# Patient Record
Sex: Female | Born: 1955 | Race: White | Hispanic: No | Marital: Married | Smoking: Never smoker
Health system: Southern US, Community
[De-identification: ages and names within clinical notes are randomized; demographics above are authoritative.]

## PROBLEM LIST (undated history)

## (undated) DIAGNOSIS — E1140 Type 2 diabetes mellitus with diabetic neuropathy, unspecified: Secondary | ICD-10-CM

## (undated) DIAGNOSIS — E785 Hyperlipidemia, unspecified: Secondary | ICD-10-CM

## (undated) DIAGNOSIS — I10 Essential (primary) hypertension: Secondary | ICD-10-CM

## (undated) DIAGNOSIS — F329 Major depressive disorder, single episode, unspecified: Secondary | ICD-10-CM

## (undated) DIAGNOSIS — E119 Type 2 diabetes mellitus without complications: Secondary | ICD-10-CM

## (undated) DIAGNOSIS — G4730 Sleep apnea, unspecified: Secondary | ICD-10-CM

## (undated) DIAGNOSIS — F32A Depression, unspecified: Secondary | ICD-10-CM

## (undated) DIAGNOSIS — R609 Edema, unspecified: Secondary | ICD-10-CM

## (undated) HISTORY — DX: Depression, unspecified: F32.A

## (undated) HISTORY — PX: FOOT SURGERY: SHX648

## (undated) HISTORY — DX: Essential (primary) hypertension: I10

## (undated) HISTORY — PX: TOTAL KNEE ARTHROPLASTY: SHX125

## (undated) HISTORY — DX: Hyperlipidemia, unspecified: E78.5

## (undated) HISTORY — DX: Morbid (severe) obesity due to excess calories: E66.01

## (undated) HISTORY — DX: Edema, unspecified: R60.9

## (undated) HISTORY — DX: Type 2 diabetes mellitus with diabetic neuropathy, unspecified: E11.40

## (undated) HISTORY — DX: Type 2 diabetes mellitus without complications: E11.9

## (undated) HISTORY — PX: SHOULDER SURGERY: SHX246

## (undated) HISTORY — DX: Sleep apnea, unspecified: G47.30

## (undated) HISTORY — PX: WRIST SURGERY: SHX841

## (undated) HISTORY — PX: ABDOMINAL HYSTERECTOMY: SHX81

## (undated) HISTORY — PX: COLONOSCOPY: SHX174

## (undated) HISTORY — DX: Major depressive disorder, single episode, unspecified: F32.9

---

## 2004-08-16 ENCOUNTER — Ambulatory Visit: Payer: Self-pay

## 2005-09-07 ENCOUNTER — Inpatient Hospital Stay: Payer: Self-pay | Admitting: Internal Medicine

## 2006-10-07 DIAGNOSIS — E785 Hyperlipidemia, unspecified: Secondary | ICD-10-CM | POA: Insufficient documentation

## 2006-10-07 DIAGNOSIS — I10 Essential (primary) hypertension: Secondary | ICD-10-CM | POA: Insufficient documentation

## 2006-10-07 DIAGNOSIS — E1140 Type 2 diabetes mellitus with diabetic neuropathy, unspecified: Secondary | ICD-10-CM | POA: Insufficient documentation

## 2007-12-02 ENCOUNTER — Ambulatory Visit: Payer: Self-pay | Admitting: Family Medicine

## 2008-01-04 ENCOUNTER — Ambulatory Visit: Payer: Self-pay | Admitting: Gastroenterology

## 2009-08-19 ENCOUNTER — Inpatient Hospital Stay: Payer: Self-pay | Admitting: Internal Medicine

## 2009-11-15 HISTORY — DX: Morbid (severe) obesity due to excess calories: E66.01

## 2010-04-23 ENCOUNTER — Ambulatory Visit: Payer: Self-pay | Admitting: Family Medicine

## 2010-06-11 ENCOUNTER — Ambulatory Visit: Payer: Self-pay | Admitting: Family Medicine

## 2010-11-12 ENCOUNTER — Ambulatory Visit: Payer: Self-pay | Admitting: Family Medicine

## 2012-12-22 ENCOUNTER — Ambulatory Visit (INDEPENDENT_AMBULATORY_CARE_PROVIDER_SITE_OTHER): Payer: 59 | Admitting: Podiatry

## 2012-12-22 ENCOUNTER — Encounter: Payer: Self-pay | Admitting: Podiatry

## 2012-12-22 ENCOUNTER — Ambulatory Visit (INDEPENDENT_AMBULATORY_CARE_PROVIDER_SITE_OTHER): Payer: 59

## 2012-12-22 VITALS — BP 119/72 | HR 88 | Resp 16 | Ht 68.0 in | Wt 312.0 lb

## 2012-12-22 DIAGNOSIS — M79609 Pain in unspecified limb: Secondary | ICD-10-CM

## 2012-12-22 DIAGNOSIS — M79672 Pain in left foot: Secondary | ICD-10-CM

## 2012-12-22 DIAGNOSIS — M7660 Achilles tendinitis, unspecified leg: Secondary | ICD-10-CM

## 2012-12-22 DIAGNOSIS — M722 Plantar fascial fibromatosis: Secondary | ICD-10-CM

## 2012-12-22 NOTE — Progress Notes (Signed)
Tina Short presents today for followup of her plantar fasciitis and her retrocalcaneal heel spurs. She states the we are no longer wear her tennis shoes to work and as you know I can't walk with regular shoe gear. She states that her feet swelling it worse a regular basis and are becoming more more painful. She's concerned that she may have to have something done and future. She relates that her blood sugars pretty good in discussing to be progressing..  Objective: Vital signs are stable she is alert and oriented x3. I have reviewed her past medical history medications and allergies. Pulses are palpable bilateral lower extremity. Capillary fill time to digits one through 5 is noted to be immediate. Decreases sorry per since once the monofilament to the level the forefoot bilateral deep tendon reflexes are intact bilateral muscle strength is normal. Orthopedic evaluation does demonstrates nodules to the posterior aspect of the bilateral Achilles right greater than left and radiographs confirm intratendinous ossification. She also has pain on palpation to the plantar aspect of the medial calcaneal tubercle at the plantar fascial calcaneal insertion site radiographs again confirm plantar distally oriented calcaneal heel spurs with plantar fasciitis. Cutaneous evaluation does demonstrate 0 is plantar aspect the bilateral foot.  Assessment: Severe tendo Achilles tendinitis with plantar fasciitis bilateral moderate to severe edema and diabetic peripheral neuropathy.  Plan: Continue use of the compression garments bilateral lower extremity. We will request special permission from her employer to be able to wear tennis shoes to work. I encouraged stretching exercises and ice therapy. I will followup with Tina Short in the near future. Should she have questions or concerns she will notify us immediately.

## 2012-12-22 NOTE — Progress Notes (Signed)
N HURT L B/L ACHILLES D 44YRS O SUDDEN C SAME A WALKING, STANDING T TENNIS SHOES, CROCS    N HURT L 5TH DIGIT RIGHT D OCT 2014 O SLOWLY C SAME A SHOES T 0    N HURT L GREAT TOE RIGHT D 2-44M O SLOWLY C SAME A HAVING NO CUSHION T 0

## 2013-02-07 ENCOUNTER — Encounter: Payer: Self-pay | Admitting: Podiatry

## 2013-02-07 ENCOUNTER — Ambulatory Visit (INDEPENDENT_AMBULATORY_CARE_PROVIDER_SITE_OTHER): Payer: 59 | Admitting: Podiatry

## 2013-02-07 VITALS — BP 139/61 | HR 95 | Resp 16 | Ht 68.0 in | Wt 309.0 lb

## 2013-02-07 DIAGNOSIS — M109 Gout, unspecified: Secondary | ICD-10-CM

## 2013-02-07 DIAGNOSIS — M779 Enthesopathy, unspecified: Secondary | ICD-10-CM

## 2013-02-07 NOTE — Progress Notes (Signed)
Tina Short presents today stating that she woke up December 26 with a red hot swollen foot and it seems to be getting better now. She denies any trauma to the foot denies any changes in her medications. Does state that she a little more around Christmas than she usually does. States her last A1c was a 6.0. She states her kidney function was in good range.  Objective: Vital signs are stable she is alert and oriented x3. Pulses are palpable right lower extremity. Negative Homans sign. She is red hot swollen joint hallux and first metatarsophalangeal joint right foot. Tenderness on range of motion. Graphic evaluation does not demonstrate anything other than soft tissue increase in density and edema overlying the first metatarsophalangeal joint.  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 First metatarsophalangeal joint right foot.  Plan: Send for an arthritic profile and injected para-articular with Kenalog and local anesthetic.

## 2013-02-08 LAB — URIC ACID: Uric Acid: 7.3 mg/dL — ABNORMAL HIGH (ref 2.5–7.1)

## 2013-02-09 ENCOUNTER — Telehealth: Payer: Self-pay | Admitting: *Deleted

## 2013-02-09 NOTE — Telephone Encounter (Signed)
Message copied by Bing Ree on Wed Feb 09, 2013 10:49 AM ------      Message from: Ernestene Kiel T      Created: Tue Feb 08, 2013  8:09 AM       Inform Tina Short that her blood work is positive for gout and we will follow up with her at her scheduled appointment.  She is to call soon if symptoms persist. ------

## 2013-02-09 NOTE — Telephone Encounter (Signed)
Called patient to let her know her uric acid was positive for gout and we will follow up with her scheduled appt and to give [place] a call if symptoms persist

## 2013-02-28 ENCOUNTER — Ambulatory Visit (INDEPENDENT_AMBULATORY_CARE_PROVIDER_SITE_OTHER): Payer: 59 | Admitting: Podiatry

## 2013-02-28 ENCOUNTER — Encounter: Payer: Self-pay | Admitting: Podiatry

## 2013-02-28 VITALS — BP 143/72 | HR 95 | Resp 20

## 2013-02-28 DIAGNOSIS — M778 Other enthesopathies, not elsewhere classified: Secondary | ICD-10-CM

## 2013-02-28 DIAGNOSIS — M779 Enthesopathy, unspecified: Principal | ICD-10-CM

## 2013-02-28 DIAGNOSIS — M7750 Other enthesopathy of unspecified foot: Secondary | ICD-10-CM

## 2013-02-28 NOTE — Progress Notes (Signed)
Its so much better. As she refers to the gout of the first metatarsophalangeal joint of the right foot.  Objective: No erythema edema saline is drainage or odor and no pain on end range of motion of the first metatarsophalangeal joint of the right foot.  Assessment: Resolving gouty arthritis and capsulitis first metatarsophalangeal joint of the right foot.  Plan: Should she start to develop any signs of gout she's to come here in good request for blood work. Once I comes back we will have her appointment made for possible demonstration of allopurinol.

## 2013-07-07 ENCOUNTER — Ambulatory Visit: Payer: Self-pay | Admitting: Family Medicine

## 2013-07-07 LAB — HM MAMMOGRAPHY: HM MAMMO: NORMAL

## 2013-07-13 ENCOUNTER — Ambulatory Visit: Payer: Self-pay | Admitting: Family Medicine

## 2013-07-13 LAB — HM DEXA SCAN: HM DEXA SCAN: NORMAL

## 2014-05-26 LAB — BASIC METABOLIC PANEL: Glucose: 186 mg/dL

## 2014-05-26 LAB — LIPID PANEL
Cholesterol: 152 mg/dL (ref 0–200)
HDL: 31 mg/dL — AB (ref 35–70)
LDL Cholesterol: 65 mg/dL
Triglycerides: 280 mg/dL — AB (ref 40–160)

## 2014-05-26 LAB — HEMOGLOBIN A1C: HEMOGLOBIN A1C: 10.3 % — AB (ref 4.0–6.0)

## 2014-05-26 LAB — TSH: TSH: 0.95 u[IU]/mL (ref <=5.90)

## 2014-07-26 ENCOUNTER — Ambulatory Visit: Payer: Self-pay | Admitting: Family Medicine

## 2014-08-08 ENCOUNTER — Other Ambulatory Visit: Payer: Self-pay | Admitting: Family Medicine

## 2014-08-08 DIAGNOSIS — F329 Major depressive disorder, single episode, unspecified: Secondary | ICD-10-CM

## 2014-08-08 DIAGNOSIS — I10 Essential (primary) hypertension: Secondary | ICD-10-CM

## 2014-08-08 DIAGNOSIS — F32A Depression, unspecified: Secondary | ICD-10-CM

## 2014-08-11 ENCOUNTER — Other Ambulatory Visit: Payer: Self-pay

## 2014-08-11 MED ORDER — LOVASTATIN 40 MG PO TABS: 40.0000 mg | ORAL_TABLET | Freq: Every day | ORAL | Status: DC

## 2014-09-12 ENCOUNTER — Ambulatory Visit (INDEPENDENT_AMBULATORY_CARE_PROVIDER_SITE_OTHER): Payer: 59 | Admitting: Family Medicine

## 2014-09-12 ENCOUNTER — Encounter: Payer: Self-pay | Admitting: Family Medicine

## 2014-09-12 ENCOUNTER — Encounter (INDEPENDENT_AMBULATORY_CARE_PROVIDER_SITE_OTHER): Payer: Self-pay

## 2014-09-12 VITALS — BP 128/60 | HR 88 | Temp 98.2°F | Resp 16 | Ht 68.0 in | Wt 305.9 lb

## 2014-09-12 DIAGNOSIS — E1165 Type 2 diabetes mellitus with hyperglycemia: Secondary | ICD-10-CM

## 2014-09-12 DIAGNOSIS — Z862 Personal history of diseases of the blood and blood-forming organs and certain disorders involving the immune mechanism: Secondary | ICD-10-CM | POA: Diagnosis not present

## 2014-09-12 DIAGNOSIS — I10 Essential (primary) hypertension: Secondary | ICD-10-CM | POA: Insufficient documentation

## 2014-09-12 DIAGNOSIS — G4700 Insomnia, unspecified: Secondary | ICD-10-CM | POA: Insufficient documentation

## 2014-09-12 DIAGNOSIS — R5383 Other fatigue: Secondary | ICD-10-CM | POA: Insufficient documentation

## 2014-09-12 DIAGNOSIS — R600 Localized edema: Secondary | ICD-10-CM | POA: Insufficient documentation

## 2014-09-12 DIAGNOSIS — M109 Gout, unspecified: Secondary | ICD-10-CM | POA: Insufficient documentation

## 2014-09-12 DIAGNOSIS — I8390 Asymptomatic varicose veins of unspecified lower extremity: Secondary | ICD-10-CM | POA: Insufficient documentation

## 2014-09-12 DIAGNOSIS — G629 Polyneuropathy, unspecified: Secondary | ICD-10-CM

## 2014-09-12 DIAGNOSIS — D510 Vitamin B12 deficiency anemia due to intrinsic factor deficiency: Secondary | ICD-10-CM | POA: Insufficient documentation

## 2014-09-12 DIAGNOSIS — M1000 Idiopathic gout, unspecified site: Secondary | ICD-10-CM

## 2014-09-12 DIAGNOSIS — I152 Hypertension secondary to endocrine disorders: Secondary | ICD-10-CM | POA: Insufficient documentation

## 2014-09-12 DIAGNOSIS — E1121 Type 2 diabetes mellitus with diabetic nephropathy: Secondary | ICD-10-CM

## 2014-09-12 DIAGNOSIS — E0821 Diabetes mellitus due to underlying condition with diabetic nephropathy: Secondary | ICD-10-CM

## 2014-09-12 DIAGNOSIS — E1342 Other specified diabetes mellitus with diabetic polyneuropathy: Secondary | ICD-10-CM

## 2014-09-12 DIAGNOSIS — E781 Pure hyperglyceridemia: Secondary | ICD-10-CM | POA: Insufficient documentation

## 2014-09-12 DIAGNOSIS — G4733 Obstructive sleep apnea (adult) (pediatric): Secondary | ICD-10-CM | POA: Insufficient documentation

## 2014-09-12 DIAGNOSIS — G5600 Carpal tunnel syndrome, unspecified upper limb: Secondary | ICD-10-CM | POA: Insufficient documentation

## 2014-09-12 DIAGNOSIS — E1142 Type 2 diabetes mellitus with diabetic polyneuropathy: Secondary | ICD-10-CM

## 2014-09-12 DIAGNOSIS — IMO0002 Reserved for concepts with insufficient information to code with codable children: Secondary | ICD-10-CM | POA: Insufficient documentation

## 2014-09-12 LAB — GLUCOSE, POCT (MANUAL RESULT ENTRY): POC GLUCOSE: 218 mg/dL — AB (ref 70–99)

## 2014-09-12 LAB — POCT UA - MICROALBUMIN: Microalbumin Ur, POC: 50 mg/L

## 2014-09-12 LAB — POCT GLYCOSYLATED HEMOGLOBIN (HGB A1C): HEMOGLOBIN A1C: 9.2

## 2014-09-12 MED ORDER — EXENATIDE ER 2 MG ~~LOC~~ PEN
1.0000 | PEN_INJECTOR | SUBCUTANEOUS | Status: DC
Start: 2014-09-12 — End: 2014-10-24

## 2014-09-12 MED ORDER — CLOTRIMAZOLE-BETAMETHASONE 1-0.05 % EX CREA
1.0000 | TOPICAL_CREAM | Freq: Two times a day (BID) | CUTANEOUS | Status: DC
Start: 2014-09-12 — End: 2015-07-26

## 2014-09-12 NOTE — Patient Instructions (Signed)
Follow-up in one month on by durian

## 2014-09-12 NOTE — Progress Notes (Signed)
Name: Tina Short   MRN: [id number]    DOB: 1956/01/22   Date:09/12/2014       Progress Note  Subjective  Chief Complaint  Chief Complaint  Patient presents with  . Diabetes    elevated A1c in April 10.3  . Hypertension  . Hyperlipidemia  . Obesity    Diabetes She presents for her follow-up diabetic visit. She has type 2 diabetes mellitus. Her disease course has been worsening. Hypoglycemia symptoms include headaches. Pertinent negatives for hypoglycemia include no dizziness, nervousness/anxiousness, seizures or tremors. Associated symptoms include blurred vision, fatigue and weakness. Pertinent negatives for diabetes include no chest pain and no weight loss. There are no hypoglycemic complications. Symptoms are worsening. Diabetic complications include peripheral neuropathy. Risk factors for coronary artery disease include diabetes mellitus, dyslipidemia, hypertension, obesity, post-menopausal, sedentary lifestyle and stress. Current diabetic treatment includes oral agent (dual therapy). She is compliant with treatment most of the time. Her weight is increasing steadily. She is following a diabetic diet. Her overall blood glucose range is 180-200 mg/dl.  Hypertension This is a chronic problem. The current episode started more than 1 year ago. The problem is unchanged. The problem is controlled. Associated symptoms include anxiety, blurred vision, headaches, malaise/fatigue and shortness of breath. Pertinent negatives include no chest pain, neck pain, orthopnea or palpitations. Agents associated with hypertension include amphetamines. Past treatments include angiotensin blockers and diuretics. The current treatment provides moderate improvement. There are no compliance problems.   Hyperlipidemia This is a chronic problem. The current episode started more than 1 year ago. The problem is uncontrolled. Recent lipid tests were reviewed and are high. Exacerbating diseases include diabetes and  obesity. Associated symptoms include leg pain and shortness of breath. Pertinent negatives include no chest pain, focal weakness or myalgias. There are no compliance problems.    Obesity  Patient has a nearly lifelong problem and struggle with obesity. She has been on appetite suppressants for a long period of time with modest improvement. However she is unable to function without them and control her appetite. She has not been receptive to gastric operative or bariatric surgery.  Past Medical History  Diagnosis Date  . Swelling   . Diabetes   . Depression   . Neuropathy due to type 2 diabetes mellitus   . Hypertension   . Hyperlipidemia   . Obesity, morbid 11/15/2009    History  Substance Use Topics  . Smoking status: Never Smoker   . Smokeless tobacco: Never Used  . Alcohol Use: No     Current outpatient prescriptions:  .  fenofibrate (TRICOR) 145 MG tablet, , Disp: , Rfl:  .  FLUoxetine (PROZAC) 20 MG capsule, Take 3 capsules by mouth  daily, Disp: 270 capsule, Rfl: 1 .  furosemide (LASIX) 40 MG tablet, , Disp: , Rfl:  .  losartan (COZAAR) 100 MG tablet, Take 1 tablet by mouth  every day, Disp: 90 tablet, Rfl: 1 .  lovastatin (MEVACOR) 40 MG tablet, Take 1 tablet (40 mg total) by mouth at bedtime., Disp: 90 tablet, Rfl: 0 .  metFORMIN (GLUCOPHAGE) 1000 MG tablet, , Disp: , Rfl:  .  Phendimetrazine Tartrate 35 MG TABS, Take by mouth 2 (two) times daily., Disp: , Rfl:  .  potassium chloride SA (K-DUR,KLOR-CON) 20 MEQ tablet, , Disp: , Rfl:  .  venlafaxine (EFFEXOR) 75 MG tablet, Take 1 tablet by mouth two  times daily, Disp: 180 tablet, Rfl: 1  Allergies  Allergen Reactions  . Abilify  [Aripiprazole]  Other reaction(s): Difficulty breathing    Review of Systems  Constitutional: Positive for malaise/fatigue and fatigue. Negative for fever, chills and weight loss.  HENT: Negative for congestion, hearing loss, sore throat and tinnitus.   Eyes: Positive for blurred  vision. Negative for double vision and redness.  Respiratory: Positive for shortness of breath. Negative for cough and hemoptysis.   Cardiovascular: Positive for leg swelling. Negative for chest pain, palpitations, orthopnea and claudication.  Gastrointestinal: Negative for heartburn, nausea, vomiting, diarrhea, constipation and blood in stool.  Genitourinary: Negative for dysuria, urgency, frequency and hematuria.  Musculoskeletal: Negative for myalgias, back pain, joint pain, falls and neck pain.  Skin: Negative for itching.  Neurological: Positive for sensory change, weakness and headaches. Negative for dizziness, tingling, tremors, focal weakness, seizures and loss of consciousness.  Endo/Heme/Allergies: Does not bruise/bleed easily.  Psychiatric/Behavioral: Positive for depression. Negative for substance abuse. The patient is not nervous/anxious and does not have insomnia.      Objective  Filed Vitals:   09/12/14 0745  BP: 128/60  Pulse: 88  Temp: 98.2 F (36.8 C)  TempSrc: Oral  Resp: 16  Height: 5\' 8"  (1.727 m)  Weight: 305 lb 14.4 oz (138.755 kg)  SpO2: 93%     Physical Exam  Constitutional: She is oriented to person, place, and time and well-developed, well-nourished, and in no distress.  Morbidly obese  HENT:  Head: Normocephalic.  Eyes: EOM are normal. Pupils are equal, round, and reactive to light.  Neck: Normal range of motion. No thyromegaly present.  Cardiovascular: Normal rate, regular rhythm and normal heart sounds.   No murmur heard. Pulmonary/Chest: Effort normal and breath sounds normal.  Abdominal: Soft. Bowel sounds are normal.  Musculoskeletal: Normal range of motion. She exhibits no edema.  Neurological: She is alert and oriented to person, place, and time. No cranial nerve deficit. Gait normal.  Skin: Skin is warm and dry. No rash noted.  Psychiatric: Memory and affect normal.      Assessment & Plan  1. Diabetes mellitus due to underlying  condition with diabetic nephropathy And by Bydurion 2 mg once weekly - POCT HgB A1C - POCT Glucose (CBG) - POCT UA - Microalbumin - Ambulatory referral to diabetic education - POCT UA - Glucose/Protein - Fructosamine - HgB A1c  2. Diabetic peripheral neuropathy Stable  3. Essential hypertension Controlled  4. Idiopathic gout, unspecified chronicity, unspecified site Stable  5. History of anemia Stable  7. Morbid obesity due to excess calories Dietary consult  8. Diabetic nephropathy associated with type 2 diabetes mellitus Still not controlled  9. Diabetes mellitus type 2, uncontrolled Stable persistent  10. Neuropathy Persistent

## 2014-09-13 LAB — FRUCTOSAMINE: Fructosamine: 266 umol/L (ref 0–285)

## 2014-09-18 ENCOUNTER — Other Ambulatory Visit: Payer: Self-pay | Admitting: Family Medicine

## 2014-10-17 ENCOUNTER — Ambulatory Visit: Payer: 59 | Admitting: Family Medicine

## 2014-10-18 ENCOUNTER — Ambulatory Visit: Payer: Self-pay | Admitting: *Deleted

## 2014-10-24 ENCOUNTER — Ambulatory Visit (INDEPENDENT_AMBULATORY_CARE_PROVIDER_SITE_OTHER): Payer: 59 | Admitting: Family Medicine

## 2014-10-24 ENCOUNTER — Encounter: Payer: Self-pay | Admitting: Family Medicine

## 2014-10-24 VITALS — BP 132/76 | HR 87 | Temp 98.3°F | Resp 18 | Ht 68.0 in | Wt 306.3 lb

## 2014-10-24 DIAGNOSIS — R600 Localized edema: Secondary | ICD-10-CM

## 2014-10-24 DIAGNOSIS — G4733 Obstructive sleep apnea (adult) (pediatric): Secondary | ICD-10-CM | POA: Diagnosis not present

## 2014-10-24 DIAGNOSIS — G629 Polyneuropathy, unspecified: Secondary | ICD-10-CM | POA: Diagnosis not present

## 2014-10-24 DIAGNOSIS — E1165 Type 2 diabetes mellitus with hyperglycemia: Secondary | ICD-10-CM

## 2014-10-24 DIAGNOSIS — E1142 Type 2 diabetes mellitus with diabetic polyneuropathy: Secondary | ICD-10-CM

## 2014-10-24 DIAGNOSIS — I10 Essential (primary) hypertension: Secondary | ICD-10-CM | POA: Diagnosis not present

## 2014-10-24 DIAGNOSIS — R609 Edema, unspecified: Secondary | ICD-10-CM

## 2014-10-24 DIAGNOSIS — Z862 Personal history of diseases of the blood and blood-forming organs and certain disorders involving the immune mechanism: Secondary | ICD-10-CM

## 2014-10-24 DIAGNOSIS — E1140 Type 2 diabetes mellitus with diabetic neuropathy, unspecified: Secondary | ICD-10-CM

## 2014-10-24 DIAGNOSIS — E1342 Other specified diabetes mellitus with diabetic polyneuropathy: Secondary | ICD-10-CM

## 2014-10-24 DIAGNOSIS — IMO0002 Reserved for concepts with insufficient information to code with codable children: Secondary | ICD-10-CM

## 2014-10-24 MED ORDER — GLIMEPIRIDE 2 MG PO TABS: 2.0000 mg | ORAL_TABLET | Freq: Every day | ORAL | Status: DC

## 2014-10-25 ENCOUNTER — Encounter: Payer: Self-pay | Admitting: Family Medicine

## 2014-10-26 NOTE — Progress Notes (Signed)
Name: Tina Short   MRN: [id number]    DOB: 10-14-1955   Date:10/26/2014       Progress Note  Subjective  Chief Complaint  Chief Complaint  Patient presents with  . Diabetes    1 month follow up after starting Bydureon    HPI  Diabetes  Patient presents for follow-up of diabetes which is present for over 5 years. Is currently on a regimen of by durian 2 mg once weekly which she is not tolerating well because of GI symptomatology including bloating and nausea without vomiting . Patient states somewhat with their diet and exercise. There's been no hypoglycemic episodes and there is infrequently polyuria polydipsia polyphagia. His average fasting glucoses been in the low around the low 100s with a high around 100s . There is no end organ disease.  Last diabetic eye exam was less than one year.   Last visit with dietitian was at work within the last year. Last microalbumin was obtained 8-16 and was positive at 50 .   Hypertension   Patient presents for follow-up of hypertension. It has been present for over over 5 years.  Patient states that there is compliance with medical regimen which consists of furosemide formal 100 mg daily 40 mg daily and losartan 100 mg daily  . There is  some nephropathy as  end organ disease. Cardiac risk factors include hypertension hyperlipidemia and diabetes.  Exercise regimen consist of minimal .  Diet consist of presents with noncompliance .  Obesity  Patient has a history of obesity for over 5ars.  Attempts at weight loss have included diet and exercise andand phentermine Results of this regimen  have lesson successful  Patient now voices and interest in weight loss by 1 to be back on phentermine again future    Past Medical History  Diagnosis Date  . Swelling   . Diabetes   . Depression   . Neuropathy due to type 2 diabetes mellitus   . Hypertension   . Hyperlipidemia   . Obesity, morbid 11/15/2009    Social History  Substance Use Topics  .  Smoking status: Never Smoker   . Smokeless tobacco: Never Used  . Alcohol Use: No     Current outpatient prescriptions:  .  clotrimazole-betamethasone (LOTRISONE) cream, Apply 1 application topically 2 (two) times daily., Disp: 30 g, Rfl: 0 .  fenofibrate (TRICOR) 145 MG tablet, , Disp: , Rfl:  .  FLUoxetine (PROZAC) 20 MG capsule, Take 3 capsules by mouth  daily, Disp: 270 capsule, Rfl: 1 .  furosemide (LASIX) 40 MG tablet, , Disp: , Rfl:  .  glimepiride (AMARYL) 2 MG tablet, Take 1 tablet (2 mg total) by mouth daily before breakfast., Disp: 30 tablet, Rfl: 1 .  losartan (COZAAR) 100 MG tablet, Take 1 tablet by mouth  every day, Disp: 90 tablet, Rfl: 1 .  lovastatin (MEVACOR) 40 MG tablet, Take 1 tablet (40 mg total) by mouth at bedtime. (Patient not taking: Reported on 09/12/2014), Disp: 90 tablet, Rfl: 0 .  metFORMIN (GLUCOPHAGE) 1000 MG tablet, , Disp: , Rfl:  .  Phendimetrazine Tartrate 35 MG TABS, Take by mouth 2 (two) times daily., Disp: , Rfl:  .  potassium chloride SA (K-DUR,KLOR-CON) 20 MEQ tablet, Take 1 tablet by mouth  every day, Disp: 90 tablet, Rfl: 3 .  venlafaxine (EFFEXOR) 75 MG tablet, Take 1 tablet by mouth two  times daily, Disp: 180 tablet, Rfl: 1  Allergies  Allergen Reactions  . Abilify  [  Aripiprazole]     Other reaction(s): Difficulty breathing    Review of Systems  Constitutional: Negative for fever, chills and weight loss.  HENT: Negative for congestion, hearing loss, sore throat and tinnitus.   Eyes: Negative for blurred vision, double vision and redness.  Respiratory: Positive for shortness of breath. Negative for cough and hemoptysis.   Cardiovascular: Positive for leg swelling. Negative for chest pain, palpitations, orthopnea and claudication.  Gastrointestinal: Negative for heartburn, nausea, vomiting, diarrhea, constipation and blood in stool.  Genitourinary: Negative for dysuria, urgency, frequency and hematuria.  Musculoskeletal: Negative for  myalgias, back pain, joint pain, falls and neck pain.  Skin: Negative for itching.  Neurological: Positive for tingling. Negative for dizziness, tremors, focal weakness, seizures, loss of consciousness, weakness and headaches.  Endo/Heme/Allergies: Does not bruise/bleed easily.  Psychiatric/Behavioral: Negative for depression and substance abuse. The patient is not nervous/anxious and does not have insomnia.      Objective  Filed Vitals:   10/24/14 0759  BP: 132/76  Pulse: 87  Temp: 98.3 F (36.8 C)  TempSrc: Oral  Resp: 18  Height: 5\' 8"  (1.727 m)  Weight: 306 lb 4.8 oz (138.937 kg)  SpO2: 94%     Physical Exam  Constitutional: She is oriented to person, place, and time and well-developed, well-nourished, and in no distress.  Morbidly obese in no acute distress  HENT:  Head: Normocephalic.  Eyes: EOM are normal. Pupils are equal, round, and reactive to light.  Neck: Normal range of motion. No thyromegaly present.  Cardiovascular: Normal rate, regular rhythm, normal heart sounds and intact distal pulses.   No murmur heard. Pulmonary/Chest: Effort normal and breath sounds normal.  Musculoskeletal: Normal range of motion. She exhibits tenderness. She exhibits no edema.  Neurological: She is alert and oriented to person, place, and time. No cranial nerve deficit. Gait normal.  Skin: Skin is warm and dry. No rash noted.  Psychiatric: Memory and affect normal.  Appears chronically depressed      Assessment & Plan   1. Type 2 diabetes mellitus, uncontrolled, with neuropathy DC by durian DC by durian - glimepiride (AMARYL) 2 MG tablet; Take 1 tablet (2 mg total) by mouth daily before breakfast.  Dispense: 30 tablet; Refill: 1 - Amb ref to Medical Nutrition Therapy-MNT  2. Benign essential HTN Well-controlled  3. Obstructive sleep apnea Unchanged  4. Diabetic peripheral neuropathy Stable  5. Neuropathy Stable  6. Edema extremities Unchanged  7. History of  anemia Stable stable  8. Morbid obesity due to excess calories Stable

## 2014-11-13 ENCOUNTER — Other Ambulatory Visit: Payer: Self-pay | Admitting: Family Medicine

## 2014-12-27 ENCOUNTER — Ambulatory Visit (INDEPENDENT_AMBULATORY_CARE_PROVIDER_SITE_OTHER): Payer: 59 | Admitting: Family Medicine

## 2014-12-27 ENCOUNTER — Encounter: Payer: Self-pay | Admitting: Family Medicine

## 2014-12-27 VITALS — BP 124/76 | HR 93 | Temp 98.1°F | Resp 16 | Ht 68.0 in | Wt 306.0 lb

## 2014-12-27 DIAGNOSIS — Z23 Encounter for immunization: Secondary | ICD-10-CM

## 2014-12-27 DIAGNOSIS — F329 Major depressive disorder, single episode, unspecified: Secondary | ICD-10-CM

## 2014-12-27 DIAGNOSIS — E669 Obesity, unspecified: Secondary | ICD-10-CM | POA: Diagnosis not present

## 2014-12-27 DIAGNOSIS — E785 Hyperlipidemia, unspecified: Secondary | ICD-10-CM

## 2014-12-27 DIAGNOSIS — E1169 Type 2 diabetes mellitus with other specified complication: Secondary | ICD-10-CM

## 2014-12-27 DIAGNOSIS — F32A Depression, unspecified: Secondary | ICD-10-CM

## 2014-12-27 LAB — GLUCOSE, POCT (MANUAL RESULT ENTRY): POC Glucose: 178 mg/dl — AB (ref 70–99)

## 2014-12-27 LAB — POCT GLYCOSYLATED HEMOGLOBIN (HGB A1C): Hemoglobin A1C: 6.4

## 2014-12-27 MED ORDER — VENLAFAXINE HCL 75 MG PO TABS: 75.0000 mg | ORAL_TABLET | Freq: Three times a day (TID) | ORAL | Status: DC

## 2014-12-27 NOTE — Progress Notes (Signed)
Name: Tina Short   MRN: [id number]    DOB: May 26, 1955   Date:12/27/2014       Progress Note  Subjective  Chief Complaint  Chief Complaint  Patient presents with  . Hypertension    3 month follow up  . Diabetes  . Hyperlipidemia    Hypertension Associated symptoms include headaches and malaise/fatigue. Pertinent negatives include no blurred vision, chest pain, neck pain, orthopnea, palpitations or shortness of breath.  Diabetes Hypoglycemia symptoms include headaches. Pertinent negatives for hypoglycemia include no dizziness, nervousness/anxiousness, seizures or tremors. Pertinent negatives for diabetes include no blurred vision, no chest pain, no weakness and no weight loss.  Hyperlipidemia Pertinent negatives include no chest pain, focal weakness, myalgias or shortness of breath.     Diabetes  Patient presents for follow-up of diabetes which is present for over 5 years. Is currently on a regimen of metformin 1 g twice a day and glimepiride 2 mg daily. . Patient states more compliant with their diet and exercise. There's been no hypoglycemic episodes and there no polyuria polydipsia polyphagia. His average fasting glucoses been in the low around -- with a high around - . There is no end organ disease.  Last diabetic eye exam was earlier this year.   Last visit with dietitian was last year. Last microalbumin was obtained August of this year and was elevated at 50 .   Hyperlipidemia  Patient has a history of hyperlipidemia for over 5 years.  Current medical regimen consist of lovastatin 40 mg daily at bedtime fenofibrate 145 daily. .  Compliance is good .  Diet and exercise are currently followed usually .  Risk factors for cardiovascular disease include hyperlipidemia hypertension diabetes and obesity and sedentary lifestyle .   There have been no side effects from the medication.    Hypertension   Patient presents for follow-up of hypertension. It has been present for over over 5  losartan 100 mg daily and pro-spine 40 mg daily potassium 20 mEq daily years.  Patient states that there is compliance with medical regimen which consists of as in prior sentence . There is no end organ disease. Cardiac risk factors include hypertension hyperlipidemia and diabetes.  Exercise regimen consist of minimal walking .  Diet consist of some salt restriction .  Obesity   Very long-standing history of obesity with minimal success even with cues appetite suppressants and dietary consultations and she often eats as a treatment for her depression  Depression  Long-standing history of depression currently being treated by Prozac 60 mg daily and Effexor 150 mg daily. Patient still has lack of energy particularly AVMs.   Past Medical History  Diagnosis Date  . Swelling   . Diabetes ([place])   . Depression   . Neuropathy due to type 2 diabetes mellitus ([place])   . Hypertension   . Hyperlipidemia   . Obesity, morbid ([place]) 11/15/2009    Social History  Substance Use Topics  . Smoking status: Never Smoker   . Smokeless tobacco: Never Used  . Alcohol Use: No     Current outpatient prescriptions:  .  clotrimazole-betamethasone (LOTRISONE) cream, Apply 1 application topically 2 (two) times daily., Disp: 30 g, Rfl: 0 .  fenofibrate (TRICOR) 145 MG tablet, , Disp: , Rfl:  .  FLUoxetine (PROZAC) 20 MG capsule, Take 3 capsules by mouth  daily, Disp: 270 capsule, Rfl: 1 .  furosemide (LASIX) 40 MG tablet, , Disp: , Rfl:  .  glimepiride (AMARYL) 2 MG tablet, Take  1 tablet (2 mg total) by mouth daily before breakfast., Disp: 30 tablet, Rfl: 1 .  losartan (COZAAR) 100 MG tablet, Take 1 tablet by mouth  every day, Disp: 90 tablet, Rfl: 1 .  lovastatin (MEVACOR) 40 MG tablet, Take 1 tablet (40 mg total) by mouth at bedtime. (Patient not taking: Reported on 09/12/2014), Disp: 90 tablet, Rfl: 0 .  metFORMIN (GLUCOPHAGE) 1000 MG tablet, Take 1 tablet by mouth two  times daily, Disp: 180 tablet, Rfl: 1 .   Phendimetrazine Tartrate 35 MG TABS, Take by mouth 2 (two) times daily., Disp: , Rfl:  .  potassium chloride SA (K-DUR,KLOR-CON) 20 MEQ tablet, Take 1 tablet by mouth  every day, Disp: 90 tablet, Rfl: 3 .  venlafaxine (EFFEXOR) 75 MG tablet, Take 1 tablet by mouth two  times daily, Disp: 180 tablet, Rfl: 1  Allergies  Allergen Reactions  . Abilify  [Aripiprazole]     Other reaction(s): Difficulty breathing    Review of Systems  Constitutional: Positive for malaise/fatigue. Negative for fever, chills and weight loss.  HENT: Negative for congestion, hearing loss, sore throat and tinnitus.   Eyes: Negative for blurred vision, double vision and redness.  Respiratory: Positive for cough. Negative for hemoptysis and shortness of breath.   Cardiovascular: Positive for leg swelling. Negative for chest pain, palpitations, orthopnea and claudication.  Gastrointestinal: Negative for heartburn, nausea, vomiting, diarrhea, constipation and blood in stool.  Genitourinary: Negative for dysuria, urgency, frequency and hematuria.  Musculoskeletal: Positive for back pain and joint pain. Negative for myalgias, falls and neck pain.  Skin: Negative for itching.  Neurological: Positive for headaches. Negative for dizziness, tingling, tremors, focal weakness, seizures, loss of consciousness and weakness.  Endo/Heme/Allergies: Does not bruise/bleed easily.  Psychiatric/Behavioral: Positive for depression. Negative for substance abuse. The patient has insomnia. The patient is not nervous/anxious.      Objective  Filed Vitals:   12/27/14 0756  BP: 124/76  Pulse: 93  Temp: 98.1 F (36.7 C)  TempSrc: Oral  Resp: 16  Height: 5\' 8"  (1.727 m)  Weight: 306 lb (138.801 kg)  SpO2: 96%     Physical Exam  Constitutional: She is oriented to person, place, and time.  Morbidly obese in no acute distress  HENT:  Head: Normocephalic.  Eyes: EOM are normal. Pupils are equal, round, and reactive to light.   Neck: Normal range of motion. No thyromegaly present.  Cardiovascular: Normal rate, regular rhythm and normal heart sounds.   No murmur heard. Pulmonary/Chest: Effort normal and breath sounds normal.  Abdominal: Soft. Bowel sounds are normal.  Musculoskeletal: Normal range of motion. She exhibits edema.  Neurological: She is alert and oriented to person, place, and time. No cranial nerve deficit. Gait normal.  Skin: Skin is warm and dry. No rash noted.  Psychiatric: Memory normal.  Depressed      Assessment & Plan  1. Type 2 diabetes mellitus with other specified complication (HCC) Much better controlled - POCT HgB A1C - POCT Glucose (CBG) - Comprehensive Metabolic Panel (CMET)  2. Hyperlipemia Obtain lab - Lipid panel - TSH  3. Need for pneumococcal vaccination Given  - Pneumococcal polysaccharide vaccine 23-valent greater than or equal to 2yo subcutaneous/IM  4. Obesity Stable  - TSH   5.Depression  Continue Prozac to 60 mg daily and increase Effexor to 150 mg in the a.m. and 70 5 in the PM

## 2015-01-01 ENCOUNTER — Other Ambulatory Visit: Payer: Self-pay | Admitting: Family Medicine

## 2015-01-01 NOTE — Telephone Encounter (Signed)
I am forwarding this encounter to the designated PCP and/or their nursing staff for further management of the tasks requested. Thank you.  

## 2015-01-04 ENCOUNTER — Other Ambulatory Visit: Payer: Self-pay | Admitting: Family Medicine

## 2015-01-18 ENCOUNTER — Other Ambulatory Visit: Payer: Self-pay

## 2015-01-18 MED ORDER — GLIMEPIRIDE 2 MG PO TABS: ORAL_TABLET | ORAL | Status: DC

## 2015-02-21 ENCOUNTER — Other Ambulatory Visit: Payer: Self-pay | Admitting: Family Medicine

## 2015-03-29 ENCOUNTER — Ambulatory Visit: Payer: 59 | Admitting: Family Medicine

## 2015-05-18 ENCOUNTER — Ambulatory Visit: Payer: 59 | Admitting: Family Medicine

## 2015-05-21 ENCOUNTER — Other Ambulatory Visit: Payer: Self-pay | Admitting: Family Medicine

## 2015-07-09 ENCOUNTER — Other Ambulatory Visit: Payer: Self-pay | Admitting: Family Medicine

## 2015-07-26 ENCOUNTER — Ambulatory Visit (INDEPENDENT_AMBULATORY_CARE_PROVIDER_SITE_OTHER): Payer: 59 | Admitting: Family Medicine

## 2015-07-26 ENCOUNTER — Encounter: Payer: Self-pay | Admitting: Family Medicine

## 2015-07-26 VITALS — BP 118/76 | HR 87 | Temp 98.9°F | Resp 18 | Ht 68.0 in | Wt 308.1 lb

## 2015-07-26 DIAGNOSIS — E1140 Type 2 diabetes mellitus with diabetic neuropathy, unspecified: Secondary | ICD-10-CM | POA: Diagnosis not present

## 2015-07-26 DIAGNOSIS — R609 Edema, unspecified: Secondary | ICD-10-CM

## 2015-07-26 DIAGNOSIS — E1165 Type 2 diabetes mellitus with hyperglycemia: Secondary | ICD-10-CM | POA: Diagnosis not present

## 2015-07-26 DIAGNOSIS — IMO0002 Reserved for concepts with insufficient information to code with codable children: Secondary | ICD-10-CM

## 2015-07-26 DIAGNOSIS — M5090 Cervical disc disorder, unspecified, unspecified cervical region: Secondary | ICD-10-CM | POA: Diagnosis not present

## 2015-07-26 DIAGNOSIS — I10 Essential (primary) hypertension: Secondary | ICD-10-CM

## 2015-07-26 DIAGNOSIS — E559 Vitamin D deficiency, unspecified: Secondary | ICD-10-CM

## 2015-07-26 DIAGNOSIS — E785 Hyperlipidemia, unspecified: Secondary | ICD-10-CM

## 2015-07-26 DIAGNOSIS — E538 Deficiency of other specified B group vitamins: Secondary | ICD-10-CM | POA: Diagnosis not present

## 2015-07-26 DIAGNOSIS — R600 Localized edema: Secondary | ICD-10-CM

## 2015-07-26 LAB — POCT GLYCOSYLATED HEMOGLOBIN (HGB A1C): HEMOGLOBIN A1C: 183

## 2015-07-26 LAB — GLUCOSE, POCT (MANUAL RESULT ENTRY): POC Glucose: 7.2 mg/dl — AB (ref 70–99)

## 2015-07-26 MED ORDER — VENLAFAXINE HCL ER 150 MG PO CP24: 150.0000 mg | ORAL_CAPSULE | Freq: Every day | ORAL | Status: DC

## 2015-07-26 NOTE — Progress Notes (Addendum)
Date:  07/26/2015   Name:  Tina Short   DOB:  [birth year]   MRN:  [id number]  PCP:  Ashok Norris, MD    Chief Complaint: Diabetes; Hypertension; and Hyperlipidemia   History of Present Illness:  This is a 60 y.o. female seen for seven month f/u. On both Effexor and Prozac for depression. C/o exacerbation neck pain past week, known cervical disc dz, seen at urgent care 3d ago, placed on Mobic, baclofen, and tramadol, improving. Taking metformin only once daily. On Lasix daily for ankle swelling, improves overnight.  Review of Systems:  Review of Systems  Constitutional: Negative for fever and fatigue.  Respiratory: Negative for cough and shortness of breath.   Cardiovascular: Negative for chest pain and leg swelling.  Endocrine: Negative for polyuria.  Genitourinary: Negative for difficulty urinating.  Neurological: Negative for syncope and light-headedness.    Patient Active Problem List   Diagnosis Date Noted  . History of anemia 09/12/2014  . Carpal tunnel syndrome 09/12/2014  . Diabetic peripheral neuropathy ([place]) 09/12/2014  . Obstructive sleep apnea 09/12/2014  . Edema extremities 09/12/2014  . Fatigue 09/12/2014  . Gout 09/12/2014  . Hypertriglyceridemia 09/12/2014  . Pernicious anemia 09/12/2014  . Extreme obesity ([place]) 11/15/2009  . Benign essential HTN 10/07/2006  . Type 2 diabetes mellitus, uncontrolled, with neuropathy ([place]) 10/07/2006  . Hyperlipidemia 10/07/2006    Prior to Admission medications   Medication Sig Start Date End Date Taking? Authorizing Provider  baclofen (LIORESAL) 10 MG tablet Take by mouth. 07/23/15 08/22/15 Yes Historical Provider, MD  meloxicam (MOBIC) 7.5 MG tablet Take by mouth. 07/23/15 07/22/16 Yes Historical Provider, MD  traMADol (ULTRAM) 50 MG tablet Take by mouth. 07/23/15 08/02/15 Yes Historical Provider, MD  ACCU-CHEK AVIVA PLUS test strip Test once daily 02/21/15   Ashok Norris, MD  atorvastatin (LIPITOR) 40 MG tablet Take 1  tablet by mouth at  bedtime 05/22/15   Ashok Norris, MD  fenofibrate (TRICOR) 145 MG tablet  10/21/12   Historical Provider, MD  furosemide (LASIX) 40 MG tablet Take 1 tablet by mouth two  times daily as needed 02/21/15   Ashok Norris, MD  glimepiride (AMARYL) 2 MG tablet Take 1 tablet by mouth  daily before breakfast 02/21/15   Ashok Norris, MD  losartan (COZAAR) 100 MG tablet Take 1 tablet by mouth  every day 05/22/15   Ashok Norris, MD  metFORMIN (GLUCOPHAGE) 1000 MG tablet Take 1 tablet by mouth two  times daily 05/22/15   Ashok Norris, MD  potassium chloride SA (K-DUR,KLOR-CON) 20 MEQ tablet Take 1 tablet by mouth  every day 05/22/15   Ashok Norris, MD  venlafaxine XR (EFFEXOR XR) 150 MG 24 hr capsule Take 1 capsule (150 mg total) by mouth daily with breakfast. 07/26/15   Adline Potter, MD    Allergies  Allergen Reactions  . Abilify  [Aripiprazole]     Other reaction(s): Difficulty breathing    Past Surgical History  Procedure Laterality Date  . Foot surgery Right     X2  . Wrist surgery Bilateral   . Shoulder surgery Right   . Abdominal hysterectomy      Social History  Substance Use Topics  . Smoking status: Never Smoker   . Smokeless tobacco: Never Used  . Alcohol Use: No    No family history on file.  Medication list has been reviewed and updated.  Physical Examination: BP 118/76 mmHg  Pulse 87  Temp(Src) 98.9 F (37.2 C)  Resp 18  Ht 5\' 8"  (1.727 m)  Wt 308 lb 2 oz (139.765 kg)  BMI 46.86 kg/m2  SpO2 93%  Physical Exam  Constitutional: She appears well-developed and well-nourished.  Cardiovascular: Normal rate, regular rhythm and normal heart sounds.   Pulmonary/Chest: Effort normal and breath sounds normal.  Musculoskeletal:  1+ BLE edema  Neurological: She is alert.  Skin: Skin is warm and dry.  Psychiatric: She has a normal mood and affect. Her behavior is normal.  Nursing note and vitals reviewed.   Assessment and Plan:  1. Benign  essential HTN Well controlled on losartan/Lasix - Comprehensive Metabolic Panel (CMET) - CBC  2. Type 2 diabetes mellitus, uncontrolled, with neuropathy (HCC) A1c 7.3% today, up from 6.4% in November, increase metformin to 1000 mg bid, cont Amaryl - POCT HgB A1C - POCT Glucose (CBG)  3. Hyperlipidemia Unclear control on Lipitor/Tricor - Lipid Profile - TSH  4. Cervical disc disease Sxs improving, complete course Mobic, use baclofen qhs and tramadol prn  5. Edema extremities Likely weight related, continue Lasix  6. Morbid obesity, unspecified obesity type ([place]) Weight up 2# from November, exercise/weight loss advised - VITAMIN D 25 Hydroxy (Vit-D Deficiency, Fractures) - B12  7. Depression D/c Prozac, change Effexor to XR 150 mg daily  Return in about 3 months (around 10/26/2015).  Satira Anis. [facility]  07/26/2015

## 2015-07-27 DIAGNOSIS — E538 Deficiency of other specified B group vitamins: Secondary | ICD-10-CM | POA: Insufficient documentation

## 2015-07-27 DIAGNOSIS — E559 Vitamin D deficiency, unspecified: Secondary | ICD-10-CM | POA: Insufficient documentation

## 2015-07-27 LAB — CBC
HEMOGLOBIN: 13.8 g/dL (ref 11.1–15.9)
Hematocrit: 41.3 % (ref 34.0–46.6)
MCH: 32.2 pg (ref 26.6–33.0)
MCHC: 33.4 g/dL (ref 31.5–35.7)
MCV: 97 fL (ref 79–97)
Platelets: 221 10*3/uL (ref 150–379)
RBC: 4.28 x10E6/uL (ref 3.77–5.28)
RDW: 13.8 % (ref 12.3–15.4)
WBC: 5.2 10*3/uL (ref 3.4–10.8)

## 2015-07-27 LAB — COMPREHENSIVE METABOLIC PANEL
A/G RATIO: 1.6 (ref 1.2–2.2)
ALBUMIN: 4.2 g/dL (ref 3.6–4.8)
ALT: 32 IU/L (ref 0–32)
AST: 43 IU/L — ABNORMAL HIGH (ref 0–40)
Alkaline Phosphatase: 61 IU/L (ref 39–117)
BUN / CREAT RATIO: 28 (ref 12–28)
BUN: 18 mg/dL (ref 8–27)
Bilirubin Total: 0.6 mg/dL (ref 0.0–1.2)
CALCIUM: 9.8 mg/dL (ref 8.7–10.3)
CO2: 22 mmol/L (ref 18–29)
Chloride: 102 mmol/L (ref 96–106)
Creatinine, Ser: 0.64 mg/dL (ref 0.57–1.00)
GFR, EST AFRICAN AMERICAN: 111 mL/min/{1.73_m2} (ref >59)
GFR, EST NON AFRICAN AMERICAN: 97 mL/min/{1.73_m2} (ref >59)
GLOBULIN, TOTAL: 2.7 g/dL (ref 1.5–4.5)
Glucose: 177 mg/dL — ABNORMAL HIGH (ref 65–99)
POTASSIUM: 4.6 mmol/L (ref 3.5–5.2)
SODIUM: 142 mmol/L (ref 134–144)
TOTAL PROTEIN: 6.9 g/dL (ref 6.0–8.5)

## 2015-07-27 LAB — LIPID PANEL
CHOL/HDL RATIO: 5.6 ratio — AB (ref 0.0–4.4)
Cholesterol, Total: 167 mg/dL (ref 100–199)
HDL: 30 mg/dL — AB (ref >39)
LDL Calculated: 65 mg/dL (ref 0–99)
Triglycerides: 362 mg/dL — ABNORMAL HIGH (ref 0–149)
VLDL Cholesterol Cal: 72 mg/dL — ABNORMAL HIGH (ref 5–40)

## 2015-07-27 LAB — TSH: TSH: 1.38 u[IU]/mL (ref 0.450–4.500)

## 2015-07-27 LAB — VITAMIN B12: Vitamin B-12: 208 pg/mL — ABNORMAL LOW (ref 211–946)

## 2015-07-27 LAB — VITAMIN D 25 HYDROXY (VIT D DEFICIENCY, FRACTURES): VIT D 25 HYDROXY: 16.8 ng/mL — AB (ref 30.0–100.0)

## 2015-07-27 MED ORDER — VITAMIN B-12 1000 MCG PO TABS: 1000.0000 ug | ORAL_TABLET | Freq: Every day | ORAL | Status: DC

## 2015-07-27 MED ORDER — VITAMIN D 50 MCG (2000 UT) PO CAPS: 1.0000 | ORAL_CAPSULE | Freq: Every day | ORAL | Status: DC

## 2015-07-27 NOTE — Addendum Note (Signed)
Addended by: Adline Potter on: 07/27/2015 09:34 AM   Modules accepted: Orders

## 2015-08-29 ENCOUNTER — Other Ambulatory Visit: Payer: Self-pay | Admitting: Family Medicine

## 2015-09-03 ENCOUNTER — Other Ambulatory Visit: Payer: Self-pay

## 2015-09-03 ENCOUNTER — Other Ambulatory Visit: Payer: Self-pay | Admitting: Family Medicine

## 2015-09-03 MED ORDER — GLIMEPIRIDE 2 MG PO TABS: ORAL_TABLET | ORAL | 0 refills | Status: DC

## 2015-09-03 MED ORDER — ATORVASTATIN CALCIUM 40 MG PO TABS: 40.0000 mg | ORAL_TABLET | Freq: Every day | ORAL | 0 refills | Status: DC

## 2015-09-03 MED ORDER — METFORMIN HCL 1000 MG PO TABS: 1000.0000 mg | ORAL_TABLET | Freq: Two times a day (BID) | ORAL | 0 refills | Status: DC

## 2015-09-03 MED ORDER — LOSARTAN POTASSIUM 100 MG PO TABS: 100.0000 mg | ORAL_TABLET | Freq: Every day | ORAL | 0 refills | Status: DC

## 2015-09-04 NOTE — Telephone Encounter (Signed)
Patient was seen July 26, 2015 with Dr. Vicente Masson and is coming to see Dr. Ancil Boozer in September due to Dr. Rutherford Nail being out.

## 2015-10-17 ENCOUNTER — Other Ambulatory Visit: Payer: Self-pay | Admitting: Family Medicine

## 2015-10-19 ENCOUNTER — Other Ambulatory Visit: Payer: Self-pay

## 2015-10-19 NOTE — Telephone Encounter (Addendum)
Patient requesting refill of Furosemide and Fenofibrate be sent to [facility].

## 2015-10-21 MED ORDER — FUROSEMIDE 40 MG PO TABS: ORAL_TABLET | ORAL | 0 refills | Status: DC

## 2015-10-22 ENCOUNTER — Other Ambulatory Visit: Payer: Self-pay

## 2015-10-22 ENCOUNTER — Other Ambulatory Visit: Payer: Self-pay | Admitting: Family Medicine

## 2015-10-22 MED ORDER — GLIMEPIRIDE 2 MG PO TABS: ORAL_TABLET | ORAL | 0 refills | Status: DC

## 2015-10-22 NOTE — Telephone Encounter (Signed)
Can we just call in one month to local pharmacy. I will likely change rx

## 2015-10-22 NOTE — Progress Notes (Unsigned)
Called and left patient a message about medication refill for 1 month until her appointment. I have tried patient cell and work number. Dr. Ancil Boozer ok Glimepiride for 1 month with 0 refills to be sent to local pharmacy.

## 2015-10-25 ENCOUNTER — Other Ambulatory Visit: Payer: Self-pay

## 2015-10-25 NOTE — Telephone Encounter (Signed)
I will likely change the medication, so I sent 30 days to local pharmacy

## 2015-10-25 NOTE — Telephone Encounter (Signed)
Patient requesting refill of Amaryl be sent to [facility].

## 2015-10-25 NOTE — Telephone Encounter (Signed)
Called and denied prescription with [facility] on 10/25/15 at 2:33 p.m.

## 2015-10-30 ENCOUNTER — Encounter: Payer: Self-pay | Admitting: Family Medicine

## 2015-10-30 ENCOUNTER — Ambulatory Visit (INDEPENDENT_AMBULATORY_CARE_PROVIDER_SITE_OTHER): Payer: 59 | Admitting: Family Medicine

## 2015-10-30 VITALS — BP 134/62 | HR 95 | Temp 98.1°F | Resp 18 | Ht 68.0 in | Wt 306.2 lb

## 2015-10-30 DIAGNOSIS — E1169 Type 2 diabetes mellitus with other specified complication: Secondary | ICD-10-CM

## 2015-10-30 DIAGNOSIS — R609 Edema, unspecified: Secondary | ICD-10-CM

## 2015-10-30 DIAGNOSIS — E538 Deficiency of other specified B group vitamins: Secondary | ICD-10-CM

## 2015-10-30 DIAGNOSIS — M109 Gout, unspecified: Secondary | ICD-10-CM

## 2015-10-30 DIAGNOSIS — F339 Major depressive disorder, recurrent, unspecified: Secondary | ICD-10-CM

## 2015-10-30 DIAGNOSIS — Z1159 Encounter for screening for other viral diseases: Secondary | ICD-10-CM

## 2015-10-30 DIAGNOSIS — E785 Hyperlipidemia, unspecified: Secondary | ICD-10-CM | POA: Diagnosis not present

## 2015-10-30 DIAGNOSIS — E1140 Type 2 diabetes mellitus with diabetic neuropathy, unspecified: Secondary | ICD-10-CM | POA: Diagnosis not present

## 2015-10-30 DIAGNOSIS — R600 Localized edema: Secondary | ICD-10-CM

## 2015-10-30 DIAGNOSIS — I10 Essential (primary) hypertension: Secondary | ICD-10-CM

## 2015-10-30 DIAGNOSIS — E1165 Type 2 diabetes mellitus with hyperglycemia: Secondary | ICD-10-CM | POA: Diagnosis not present

## 2015-10-30 DIAGNOSIS — E559 Vitamin D deficiency, unspecified: Secondary | ICD-10-CM

## 2015-10-30 DIAGNOSIS — IMO0002 Reserved for concepts with insufficient information to code with codable children: Secondary | ICD-10-CM

## 2015-10-30 MED ORDER — ATORVASTATIN CALCIUM 40 MG PO TABS: 40.0000 mg | ORAL_TABLET | Freq: Every day | ORAL | 0 refills | Status: DC

## 2015-10-30 MED ORDER — VITAMIN D (ERGOCALCIFEROL) 1.25 MG (50000 UNIT) PO CAPS: 50000.0000 [IU] | ORAL_CAPSULE | ORAL | 0 refills | Status: DC

## 2015-10-30 MED ORDER — CYANOCOBALAMIN 1000 MCG SL SUBL
1.0000 | SUBLINGUAL_TABLET | Freq: Every day | SUBLINGUAL | 1 refills | Status: DC
Start: 2015-10-30 — End: 2016-04-29

## 2015-10-30 MED ORDER — FUROSEMIDE 40 MG PO TABS: ORAL_TABLET | ORAL | 0 refills | Status: DC

## 2015-10-30 MED ORDER — VENLAFAXINE HCL ER 150 MG PO CP24: 300.0000 mg | ORAL_CAPSULE | Freq: Every day | ORAL | 0 refills | Status: DC

## 2015-10-30 MED ORDER — METFORMIN HCL ER (OSM) 1000 MG PO TB24: 2000.0000 mg | ORAL_TABLET | Freq: Every day | ORAL | 1 refills | Status: DC

## 2015-10-30 MED ORDER — DULAGLUTIDE 1.5 MG/0.5ML ~~LOC~~ SOAJ: 1.5000 mg | SUBCUTANEOUS | 0 refills | Status: DC

## 2015-10-30 MED ORDER — ALBIGLUTIDE 50 MG ~~LOC~~ PEN: 50.0000 mg | PEN_INJECTOR | Freq: Every day | SUBCUTANEOUS | 0 refills | Status: DC

## 2015-10-30 MED ORDER — LOSARTAN POTASSIUM 100 MG PO TABS: 100.0000 mg | ORAL_TABLET | Freq: Every day | ORAL | 0 refills | Status: DC

## 2015-10-30 MED ORDER — CYANOCOBALAMIN 1000 MCG/ML IJ SOLN
1000.0000 ug | Freq: Once | INTRAMUSCULAR | Status: AC
  Administered 2015-10-30: 1000 ug via INTRAMUSCULAR

## 2015-10-30 NOTE — Progress Notes (Signed)
Name: Tina Short   MRN: [id number]    DOB: Oct 26, 1955   Date:10/30/2015       Progress Note  Subjective  Chief Complaint  Chief Complaint  Patient presents with  . Follow-up    3 mnth follow up / health screening form     HPI  DMII: she has a history of DM for many years, seen by Endocrinologist in the past. Last hgbA1C above 7. Currently on Metformin and Glyburide. FSBS not checked on a regular basis, but when she checks can be up to 175. She denies polyphagia, she has polydipsia and polyuria. She takes statin, Losartan ( ARB) and needs to resume aspirin 81 mg  Gout: no recent episodes, not taking medication for it, we will check levels  Dyslipidemia: her HDL was low, and triglycerides up, discussed life style modification  Morbid obesity: she has a long history of obesity, her weight has been stable since last year, discussed ways to lose weight so I can fill out ehealthscreening for her work, She is very depressed and not very active  HTN: bp is at goal, no chest pain or palpitation, she takes Furosemide because of lower extremity edema, usually once in am and denies side effects  Major Depression: she has been feeling worse, she states she has been off Fluoxetine  and on Effexor 150mg  since June and symptoms are worse, no motivation, only able to go work, but when she gets home she just wants to stay alone and sleep. She stopped cleaning, avoids cooking. Her father committed suicide in his 10's and there are other family members with history of psychiatric illness. She denies suicidal thoughts, but would be fine if she died. Children and her husband are worried about her.   OSA: she uses CPAP every night.   Patient Active Problem List   Diagnosis Date Noted  . Major depression, recurrent, chronic ([place]) 10/30/2015  . Dyslipidemia associated with type 2 diabetes mellitus ([place]) 10/30/2015  . Vitamin D deficiency 07/27/2015  . Vitamin B12 deficiency 07/27/2015  . History of  anemia 09/12/2014  . Carpal tunnel syndrome 09/12/2014  . Obstructive sleep apnea 09/12/2014  . Edema extremities 09/12/2014  . Gout 09/12/2014  . Extreme obesity ([place]) 11/15/2009  . Benign essential HTN 10/07/2006  . Type 2 diabetes mellitus, uncontrolled, with neuropathy ([place]) 10/07/2006  . Hyperlipidemia 10/07/2006    Past Surgical History:  Procedure Laterality Date  . ABDOMINAL HYSTERECTOMY    . FOOT SURGERY Right    X2  . SHOULDER SURGERY Right   . WRIST SURGERY Bilateral     Family History  Problem Relation Age of Onset  . Diabetes Mother   . Cancer Mother   . Mental illness Father   . Obesity Daughter   . Diabetes Maternal Grandmother   . Heart disease Brother   . Obesity Daughter     Social History   Social History  . Marital status: Married    Spouse name: N/A  . Number of children: N/A  . Years of education: N/A   Occupational History  . Not on file.   Social History Main Topics  . Smoking status: Never Smoker  . Smokeless tobacco: Never Used  . Alcohol use No  . Drug use: No  . Sexual activity: Yes    Partners: Male   Other Topics Concern  . Not on file   Social History Narrative  . No narrative on file     Current Outpatient Prescriptions:  .  ACCU-CHEK AVIVA PLUS test strip, Test once daily, Disp: 100 each, Rfl: 11 .  atorvastatin (LIPITOR) 40 MG tablet, Take 1 tablet (40 mg total) by mouth at bedtime., Disp: 90 tablet, Rfl: 0 .  Cyanocobalamin (B-12-SL) 1000 MCG SUBL, Place 1 tablet (1,000 mcg total) under the tongue daily., Disp: 90 tablet, Rfl: 1 .  Dulaglutide (TRULICITY) 1.5 0000000 SOPN, Inject 1.5 mg into the skin once a week., Disp: 4 pen, Rfl: 0 .  furosemide (LASIX) 40 MG tablet, Daily, Disp: 90 tablet, Rfl: 0 .  losartan (COZAAR) 100 MG tablet, Take 1 tablet (100 mg total) by mouth daily., Disp: 90 tablet, Rfl: 0 .  metformin (FORTAMET) 1000 MG (OSM) 24 hr tablet, Take 2 tablets (2,000 mg total) by mouth daily with breakfast.,  Disp: 180 tablet, Rfl: 1 .  Multiple Vitamin (MULTIVITAMIN) capsule, Take 1 capsule by mouth daily., Disp: , Rfl:  .  potassium chloride SA (K-DUR,KLOR-CON) 20 MEQ tablet, Take 1 tablet by mouth  every day, Disp: 90 tablet, Rfl: 0 .  venlafaxine XR (EFFEXOR XR) 150 MG 24 hr capsule, Take 2 capsules (300 mg total) by mouth daily with breakfast., Disp: 180 capsule, Rfl: 0 .  Vitamin D, Ergocalciferol, (DRISDOL) 50000 units CAPS capsule, Take 1 capsule (50,000 Units total) by mouth every 7 (seven) days., Disp: 12 capsule, Rfl: 0  Allergies  Allergen Reactions  . Abilify  [Aripiprazole]     Other reaction(s): Difficulty breathing  . Invokana [Canagliflozin] Itching    Yeast infections     ROS  Constitutional: Negative for fever or weight change.  Respiratory: Negative for cough and shortness of breath.   Cardiovascular: Negative for chest pain or palpitations.  Gastrointestinal: Negative for abdominal pain, no bowel changes.  Musculoskeletal: Positive for gait problem ( hip pain when walking ) or joint swelling.  Skin: Negative for rash.  Neurological: Negative for dizziness or headache. Paresthesia right hand ( history of carpal tunnel but doe not want to see Ortho at this time) No other specific complaints in a complete review of systems (except as listed in HPI above).  Objective  Vitals:   10/30/15 0858  BP: 134/62  Pulse: 95  Resp: 18  Temp: 98.1 F (36.7 C)  TempSrc: Oral  SpO2: 91%  Weight: (!) 306 lb 4 oz (138.9 kg)  Height: 5\' 8"  (1.727 m)    Body mass index is 46.57 kg/m.  Physical Exam  Constitutional: Patient appears well-developed and well-nourished. ObeseNo distress.  HEENT: head atraumatic, normocephalic, pupils equal and reactive to light,neck supple, throat within normal limits Cardiovascular: Normal rate, regular rhythm and normal heart sounds.  No murmur heard. Trace BLE edema. Pulmonary/Chest: Effort normal and breath sounds normal. No respiratory  distress. Abdominal: Soft.  There is no tenderness. Psychiatric: Patient has a normal mood and affect. behavior is normal. Judgment and thought content normal.  Diabetic Foot Exam: Diabetic Foot Exam - Simple   Simple Foot Form Diabetic Foot exam was performed with the following findings:  Yes 10/30/2015  9:59 AM  Visual Inspection See comments:  Yes Sensation Testing Intact to touch and monofilament testing bilaterally:  Yes Pulse Check Posterior Tibialis and Dorsalis pulse intact bilaterally:  Yes Comments Dry feet      PHQ2/9: Depression screen [facility] 2/9 10/30/2015 12/27/2014  Decreased Interest 3 0  Down, Depressed, Hopeless 3 0  PHQ - 2 Score 6 0  Altered sleeping 0 -  Tired, decreased energy 1 -  Change in appetite 0 -  Feeling bad or failure about yourself  3 -  Trouble concentrating 1 -  Moving slowly or fidgety/restless 0 -  Suicidal thoughts 0 -  PHQ-9 Score 11 -  Difficult doing work/chores Not difficult at all -     Fall Risk: Fall Risk  10/30/2015 12/27/2014 10/24/2014  Falls in the past year? No No No      Functional Status Survey: Is the patient deaf or have difficulty hearing?: No Does the patient have difficulty seeing, even when wearing glasses/contacts?: Yes Does the patient have difficulty concentrating, remembering, or making decisions?: No Does the patient have difficulty walking or climbing stairs?: No Does the patient have difficulty dressing or bathing?: No Does the patient have difficulty doing errands alone such as visiting a doctor's office or shopping?: No   Assessment & Plan  1. Benign essential HTN  - losartan (COZAAR) 100 MG tablet; Take 1 tablet (100 mg total) by mouth daily.  Dispense: 90 tablet; Refill: 0 - COMPLETE METABOLIC PANEL WITH GFR  2. Type 2 diabetes mellitus, uncontrolled, with neuropathy (HCC)  Discussed possible side effects of [place], she is willing to try it - metformin (FORTAMET) 1000 MG (OSM) 24 hr tablet;  Take 2 tablets (2,000 mg total) by mouth daily with breakfast.  Dispense: 180 tablet; Refill: 1 - Hemoglobin [id number] We will try Trulicity 3. Hyperlipidemia  - atorvastatin (LIPITOR) 40 MG tablet; Take 1 tablet (40 mg total) by mouth at bedtime.  Dispense: 90 tablet; Refill: 0 - Lipid panel  4. Morbid obesity, unspecified obesity type [facility])  Discussed with the patient the risk posed by an increased BMI. Discussed importance of portion control, calorie counting and at least 150 minutes of physical activity weekly. Avoid sweet beverages and drink more water. Eat at least 6 servings of fruit and vegetables daily   5. Vitamin D deficiency  - VITAMIN D 25 Hydroxy (Vit-D Deficiency, Fractures) - Vitamin D, Ergocalciferol, (DRISDOL) 50000 units CAPS capsule; Take 1 capsule (50,000 Units total) by mouth every 7 (seven) days.  Dispense: 12 capsule; Refill: 0  6. Vitamin B12 deficiency  - Vitamin B12 - Cyanocobalamin (B-12-SL) 1000 MCG SUBL; Place 1 tablet (1,000 mcg total) under the tongue daily.  Dispense: 90 tablet; Refill: 1 - cyanocobalamin ((VITAMIN B-12)) injection 1,000 mcg; Inject 1 mL (1,000 mcg total) into the muscle once.  7. Controlled gout  - Uric acid  8. Edema extremities  - furosemide (LASIX) 40 MG tablet; Daily  Dispense: 90 tablet; Refill: 0  9. Major depression, recurrent, chronic (HCC)  - venlafaxine XR (EFFEXOR XR) 150 MG 24 hr capsule; Take 2 capsules (300 mg total) by mouth daily with breakfast.  Dispense: 180 capsule; Refill: 0 - Ambulatory referral to Psychiatry  10. Need for hepatitis C screening test  - Hepatitis C antibody

## 2015-11-17 ENCOUNTER — Other Ambulatory Visit: Payer: Self-pay | Admitting: Family Medicine

## 2015-11-20 ENCOUNTER — Other Ambulatory Visit: Payer: Self-pay | Admitting: Family Medicine

## 2015-11-20 LAB — COMPREHENSIVE METABOLIC PANEL
ALK PHOS: 54 IU/L (ref 39–117)
ALT: 39 IU/L — AB (ref 0–32)
AST: 40 IU/L (ref 0–40)
Albumin/Globulin Ratio: 1.6 (ref 1.2–2.2)
Albumin: 4.7 g/dL (ref 3.6–4.8)
BILIRUBIN TOTAL: 0.4 mg/dL (ref 0.0–1.2)
BUN/Creatinine Ratio: 16 (ref 12–28)
BUN: 12 mg/dL (ref 8–27)
CHLORIDE: 99 mmol/L (ref 96–106)
CO2: 21 mmol/L (ref 18–29)
CREATININE: 0.77 mg/dL (ref 0.57–1.00)
Calcium: 10.1 mg/dL (ref 8.7–10.3)
GFR calc Af Amer: 96 mL/min/{1.73_m2} (ref >59)
GFR calc non Af Amer: 84 mL/min/{1.73_m2} (ref >59)
GLUCOSE: 129 mg/dL — AB (ref 65–99)
Globulin, Total: 2.9 g/dL (ref 1.5–4.5)
Potassium: 4.3 mmol/L (ref 3.5–5.2)
Sodium: 142 mmol/L (ref 134–144)
Total Protein: 7.6 g/dL (ref 6.0–8.5)

## 2015-11-20 LAB — HEPATITIS C ANTIBODY: HEP C VIRUS AB: 0.1 {s_co_ratio} (ref 0.0–0.9)

## 2015-11-20 LAB — LIPID PANEL
CHOL/HDL RATIO: 5.4 ratio — AB (ref 0.0–4.4)
Cholesterol, Total: 150 mg/dL (ref 100–199)
HDL: 28 mg/dL — AB (ref >39)
Triglycerides: 424 mg/dL — ABNORMAL HIGH (ref 0–149)

## 2015-11-20 LAB — VITAMIN B12: Vitamin B-12: 522 pg/mL (ref 211–946)

## 2015-11-20 LAB — HEMOGLOBIN A1C
ESTIMATED AVERAGE GLUCOSE: 151 mg/dL
HEMOGLOBIN A1C: 6.9 % — AB (ref 4.8–5.6)

## 2015-11-20 LAB — VITAMIN D 25 HYDROXY (VIT D DEFICIENCY, FRACTURES): VIT D 25 HYDROXY: 18 ng/mL — AB (ref 30.0–100.0)

## 2015-11-20 LAB — URIC ACID: URIC ACID: 7.3 mg/dL — AB (ref 2.5–7.1)

## 2015-11-20 MED ORDER — OMEGA-3-ACID ETHYL ESTERS 1 G PO CAPS: 2.0000 g | ORAL_CAPSULE | Freq: Two times a day (BID) | ORAL | 2 refills | Status: DC

## 2015-11-23 ENCOUNTER — Ambulatory Visit (INDEPENDENT_AMBULATORY_CARE_PROVIDER_SITE_OTHER): Payer: 59 | Admitting: Family Medicine

## 2015-11-23 ENCOUNTER — Encounter: Payer: Self-pay | Admitting: Family Medicine

## 2015-11-23 VITALS — BP 118/60 | HR 93 | Temp 98.1°F | Resp 16 | Ht 68.0 in | Wt 296.5 lb

## 2015-11-23 DIAGNOSIS — E538 Deficiency of other specified B group vitamins: Secondary | ICD-10-CM

## 2015-11-23 DIAGNOSIS — J0191 Acute recurrent sinusitis, unspecified: Secondary | ICD-10-CM

## 2015-11-23 DIAGNOSIS — E1165 Type 2 diabetes mellitus with hyperglycemia: Secondary | ICD-10-CM | POA: Diagnosis not present

## 2015-11-23 DIAGNOSIS — H1033 Unspecified acute conjunctivitis, bilateral: Secondary | ICD-10-CM

## 2015-11-23 DIAGNOSIS — E1140 Type 2 diabetes mellitus with diabetic neuropathy, unspecified: Secondary | ICD-10-CM | POA: Diagnosis not present

## 2015-11-23 DIAGNOSIS — G4733 Obstructive sleep apnea (adult) (pediatric): Secondary | ICD-10-CM

## 2015-11-23 DIAGNOSIS — F339 Major depressive disorder, recurrent, unspecified: Secondary | ICD-10-CM

## 2015-11-23 DIAGNOSIS — IMO0002 Reserved for concepts with insufficient information to code with codable children: Secondary | ICD-10-CM

## 2015-11-23 MED ORDER — AZITHROMYCIN 500 MG PO TABS: 500.0000 mg | ORAL_TABLET | Freq: Every day | ORAL | 0 refills | Status: DC

## 2015-11-23 MED ORDER — CYANOCOBALAMIN 1000 MCG/ML IJ SOLN
1000.0000 ug | Freq: Once | INTRAMUSCULAR | Status: AC
  Administered 2015-11-23: 1000 ug via INTRAMUSCULAR

## 2015-11-23 MED ORDER — CIPROFLOXACIN HCL 0.3 % OP SOLN: 2.0000 [drp] | OPHTHALMIC | 0 refills | Status: DC

## 2015-11-23 MED ORDER — CYANOCOBALAMIN 1000 MCG/ML IJ SOLN: 1000.0000 ug | Freq: Once | INTRAMUSCULAR | 0 refills | Status: AC

## 2015-11-23 MED ORDER — DULAGLUTIDE 1.5 MG/0.5ML ~~LOC~~ SOAJ: 1.5000 mg | SUBCUTANEOUS | 1 refills | Status: DC

## 2015-11-23 NOTE — Addendum Note (Signed)
Addended by: Inda Coke on: 11/23/2015 09:04 AM   Modules accepted: Orders

## 2015-11-23 NOTE — Progress Notes (Signed)
Name: Tina Short   MRN: [id number]    DOB: 1955/06/22   Date:11/23/2015       Progress Note  Subjective  Chief Complaint  Chief Complaint  Patient presents with  . Diabetes    pt here for 1 month folow up. Pt not checking BS said that insurance will not cover kit she has  . Nasal Congestion    chest and colored sputum and eye drainage for 3 weeks  . Referral    psychiatry referral was not placed last visit    HPI  DMII: she has a history of DM for many years, seen by Endocrinologist in the past. Last hgbA1C 6.9%. She is currently on Trulicity and Fortamet ( since 10/2015 ) we stopped Glyburide. She has lost over 9 lbs in the past month. She denies polyphagia, she has polydipsia and polyuria. She takes statin, Losartan ( ARB) and needs to resume aspirin 81 mg. Only side effects of Trulicity is that she no longer feels hungry all the time.   Morbid obesity: she has a long history of obesity, she was started on Trulicity in 37/2902 and is responding well, no longer over eating and has lost 9 lbs, we will fill the ehealth screen form for [facility] today.   Major Depression: Her father committed suicide in his 56's and there are other family members with history of psychiatric illness. She denies suicidal thoughts, but would be fine if she died. Children and her husband are worried about her. We increased Effexor to 300 mg daily in September because of worsening of symptoms of depression and lack of motivation but she states she is not sure if she is better because she has been fighting a cold for the past 3 weeks. She does not want to see a psychiatrist at this time, but will try counseling through her work. PHQ9 has improved  OSA: she uses CPAP every night., however feeling more tired than usual lately. She has not had any recent CPAP titration and we will set her up  B12 deficiency: she had B12 injection last month but did not noticed a difference in energy, advised to only use SL b12  the week before next B12 injection  Sinusitis: she states she had a cold a few weeks ago and symptoms are getting with facial pressure, nasal congestion, lots of drainage, eye matted shut, post-nasal drainage and cough, feeling more tired than usual, but no fever.    Patient Active Problem List   Diagnosis Date Noted  . Major depression, recurrent, chronic ([place]) 10/30/2015  . Dyslipidemia associated with type 2 diabetes mellitus ([place]) 10/30/2015  . Vitamin D deficiency 07/27/2015  . Vitamin B12 deficiency 07/27/2015  . History of anemia 09/12/2014  . Carpal tunnel syndrome 09/12/2014  . Obstructive sleep apnea 09/12/2014  . Edema extremities 09/12/2014  . Gout 09/12/2014  . Extreme obesity ([place]) 11/15/2009  . Benign essential HTN 10/07/2006  . Type 2 diabetes mellitus, uncontrolled, with neuropathy ([place]) 10/07/2006  . Hyperlipidemia 10/07/2006    Past Surgical History:  Procedure Laterality Date  . ABDOMINAL HYSTERECTOMY    . FOOT SURGERY Right    X2  . SHOULDER SURGERY Right   . WRIST SURGERY Bilateral     Family History  Problem Relation Age of Onset  . Diabetes Mother   . Cancer Mother   . Mental illness Father   . Obesity Daughter   . Diabetes Maternal Grandmother   . Heart disease Brother   . Obesity  Daughter     Social History   Social History  . Marital status: Married    Spouse name: N/A  . Number of children: N/A  . Years of education: N/A   Occupational History  . Not on file.   Social History Main Topics  . Smoking status: Never Smoker  . Smokeless tobacco: Never Used  . Alcohol use No  . Drug use: No  . Sexual activity: Yes    Partners: Male   Other Topics Concern  . Not on file   Social History Narrative  . No narrative on file     Current Outpatient Prescriptions:  .  ACCU-CHEK AVIVA PLUS test strip, Test once daily, Disp: 100 each, Rfl: 11 .  atorvastatin (LIPITOR) 40 MG tablet, Take 1 tablet (40 mg total) by mouth at bedtime.,  Disp: 90 tablet, Rfl: 0 .  azithromycin (ZITHROMAX) 500 MG tablet, Take 1 tablet (500 mg total) by mouth daily., Disp: 3 tablet, Rfl: 0 .  cyanocobalamin (,VITAMIN B-12,) 1000 MCG/ML injection, Inject 1 mL (1,000 mcg total) into the muscle once., Disp: 1 mL, Rfl: 0 .  Cyanocobalamin (B-12-SL) 1000 MCG SUBL, Place 1 tablet (1,000 mcg total) under the tongue daily., Disp: 90 tablet, Rfl: 1 .  Dulaglutide (TRULICITY) 1.5 JM/4.2AS SOPN, Inject 1.5 mg into the skin once a week., Disp: 12 pen, Rfl: 1 .  furosemide (LASIX) 40 MG tablet, Daily, Disp: 90 tablet, Rfl: 0 .  losartan (COZAAR) 100 MG tablet, Take 1 tablet (100 mg total) by mouth daily., Disp: 90 tablet, Rfl: 0 .  metformin (FORTAMET) 1000 MG (OSM) 24 hr tablet, Take 2 tablets (2,000 mg total) by mouth daily with breakfast., Disp: 180 tablet, Rfl: 1 .  Multiple Vitamin (MULTIVITAMIN) capsule, Take 1 capsule by mouth daily., Disp: , Rfl:  .  omega-3 acid ethyl esters (LOVAZA) 1 g capsule, Take 2 capsules (2 g total) by mouth 2 (two) times daily., Disp: 120 capsule, Rfl: 2 .  potassium chloride SA (K-DUR,KLOR-CON) 20 MEQ tablet, Take 1 tablet by mouth  every day, Disp: 90 tablet, Rfl: 0 .  venlafaxine XR (EFFEXOR XR) 150 MG 24 hr capsule, Take 2 capsules (300 mg total) by mouth daily with breakfast., Disp: 180 capsule, Rfl: 0 .  Vitamin D, Ergocalciferol, (DRISDOL) 50000 units CAPS capsule, Take 1 capsule (50,000 Units total) by mouth every 7 (seven) days., Disp: 12 capsule, Rfl: 0  Allergies  Allergen Reactions  . Abilify  [Aripiprazole]     Other reaction(s): Difficulty breathing  . Invokana [Canagliflozin] Itching    Yeast infections     ROS  Constitutional: Negative for fever, positive for  weight change.  Respiratory: Positive  for cough and mild shortness of breath with coughing spells.   Cardiovascular: Negative for chest pain or palpitations.  Gastrointestinal: Negative for abdominal pain, no bowel changes.  Musculoskeletal:  Negative for gait problem or joint swelling.  Skin: Negative for rash.  Neurological: Negative for dizziness , positive for headache.  No other specific complaints in a complete review of systems (except as listed in HPI above).  Objective  Vitals:   11/23/15 0818  BP: 118/60  Pulse: 93  Resp: 16  Temp: 98.1 F (36.7 C)  SpO2: 93%  Weight: 296 lb 8 oz (134.5 kg)  Height: '5\' 8"'  (1.727 m)    Body mass index is 45.08 kg/m.  Physical Exam  Constitutional: Patient appears well-developed and well-nourished. Obese  No distress.  HEENT: head atraumatic, normocephalic, pupils equal  and reactive to light, ears normal TM bilaterally , neck supple, throat within normal limits, tender maxillary sinus Cardiovascular: Normal rate, regular rhythm and normal heart sounds.  No murmur heard. No pitting  BLE edema. Pulmonary/Chest: Effort normal and breath sounds normal. No respiratory distress. Abdominal: Soft.  There is no tenderness. Psychiatric: Patient has a depressed mood. Judgment and thought content normal.  Recent Results (from the past 2160 hour(s))  Vitamin D (25 hydroxy)     Status: Abnormal   Collection Time: 11/19/15  4:39 PM  Result Value Ref Range   Vit D, 25-Hydroxy 18.0 (L) 30.0 - 100.0 ng/mL    Comment: Vitamin D deficiency has been defined by the [place] practice guideline as a level of serum 25-OH vitamin D less than 20 ng/mL (1,2). The Endocrine Society went on to further define vitamin D insufficiency as a level between 21 and 29 ng/mL (2). 1. IOM (Institute of Medicine). 2010. Dietary reference    intakes for calcium and D. [place]: The    [employer]. 2. Holick MF, Binkley [place], Bischoff-Ferrari HA, et al.    Evaluation, treatment, and prevention of vitamin D    deficiency: an Endocrine Society clinical practice    guideline. JCEM. 2011 Jul; 96(7):1911-30.   B12     Status: None   Collection Time: 11/19/15   4:39 PM  Result Value Ref Range   Vitamin B-12 522 211 - 946 pg/mL  Uric acid     Status: Abnormal   Collection Time: 11/19/15  4:39 PM  Result Value Ref Range   Uric Acid 7.3 (H) 2.5 - 7.1 mg/dL    Comment:            Therapeutic target for gout patients: <6.0  Lipid Profile     Status: Abnormal   Collection Time: 11/19/15  4:39 PM  Result Value Ref Range   Cholesterol, Total 150 100 - 199 mg/dL   Triglycerides 424 (H) 0 - 149 mg/dL   HDL 28 (L) >39 mg/dL   VLDL Cholesterol Cal Comment 5 - 40 mg/dL    Comment: The calculation for the VLDL cholesterol is not valid when triglyceride level is >400 mg/dL.    LDL Calculated Comment 0 - 99 mg/dL    Comment: Triglyceride result indicated is too high for an accurate LDL cholesterol estimation.    Chol/HDL Ratio 5.4 (H) 0.0 - 4.4 ratio units    Comment:                                   T. Chol/HDL Ratio                                             Men  Women                               1/2 Avg.Risk  3.4    3.3                                   Avg.Risk  5.0    4.4  2X Avg.Risk  9.6    7.1                                3X Avg.Risk 23.4   11.0   Hepatitis C Antibody     Status: None   Collection Time: 11/19/15  4:39 PM  Result Value Ref Range   Hep C Virus Ab 0.1 0.0 - 0.9 s/co ratio    Comment:                                   Negative:     < 0.8                              Indeterminate: 0.8 - 0.9                                   Positive:     > 0.9  The CDC recommends that a positive HCV antibody result  be followed up with a HCV Nucleic Acid Amplification  test (481856).   HgB A1c     Status: Abnormal   Collection Time: 11/19/15  4:39 PM  Result Value Ref Range   Hgb A1c MFr Bld 6.9 (H) 4.8 - 5.6 %    Comment:          Pre-diabetes: 5.7 - 6.4          Diabetes: >6.4          Glycemic control for adults with diabetes: <7.0    Est. average glucose Bld gHb Est-mCnc 151 mg/dL  Comprehensive  Metabolic Panel (CMET)     Status: Abnormal   Collection Time: 11/19/15  4:39 PM  Result Value Ref Range   Glucose 129 (H) 65 - 99 mg/dL   BUN 12 8 - 27 mg/dL   Creatinine, Ser 0.77 0.57 - 1.00 mg/dL   GFR calc non Af Amer 84 >59 mL/min/1.73   GFR calc Af Amer 96 >59 mL/min/1.73   BUN/Creatinine Ratio 16 12 - 28   Sodium 142 134 - 144 mmol/L   Potassium 4.3 3.5 - 5.2 mmol/L   Chloride 99 96 - 106 mmol/L   CO2 21 18 - 29 mmol/L   Calcium 10.1 8.7 - 10.3 mg/dL   Total Protein 7.6 6.0 - 8.5 g/dL   Albumin 4.7 3.6 - 4.8 g/dL   Globulin, Total 2.9 1.5 - 4.5 g/dL   Albumin/Globulin Ratio 1.6 1.2 - 2.2   Bilirubin Total 0.4 0.0 - 1.2 mg/dL   Alkaline Phosphatase 54 39 - 117 IU/L   AST 40 0 - 40 IU/L   ALT 39 (H) 0 - 32 IU/L     PHQ2/9: Depression screen [facility] 2/9 11/23/2015 10/30/2015 12/27/2014  Decreased Interest 1 3 0  Down, Depressed, Hopeless 1 3 0  PHQ - 2 Score 2 6 0  Altered sleeping 1 0 -  Tired, decreased energy 1 1 -  Change in appetite 0 0 -  Feeling bad or failure about yourself  0 3 -  Trouble concentrating 0 1 -  Moving slowly or fidgety/restless 0 0 -  Suicidal thoughts 0 0 -  PHQ-9 Score 4 11 -  Difficult doing work/chores Somewhat difficult Not difficult at all -  Fall Risk: Fall Risk  11/23/2015 10/30/2015 12/27/2014 10/24/2014  Falls in the past year? No No No No      Functional Status Survey: Is the patient deaf or have difficulty hearing?: No Does the patient have difficulty seeing, even when wearing glasses/contacts?: No Does the patient have difficulty concentrating, remembering, or making decisions?: No Does the patient have difficulty walking or climbing stairs?: No Does the patient have difficulty dressing or bathing?: No Does the patient have difficulty doing errands alone such as visiting a doctor's office or shopping?: No   Assessment & Plan  1. Type 2 diabetes mellitus, uncontrolled, with neuropathy (HCC)  Doing well on medication   - Dulaglutide (TRULICITY) 1.5 AQ/7.6AU SOPN; Inject 1.5 mg into the skin once a week.  Dispense: 12 pen; Refill: 1  2. Morbid obesity, unspecified obesity type [facility])  Discussed with the patient the risk posed by an increased BMI. Discussed importance of portion control, calorie counting and at least 150 minutes of physical activity weekly. Avoid sweet beverages and drink more water. Eat at least 6 servings of fruit and vegetables daily   3. Major depression, recurrent, chronic (HCC)  Doing better based on PHQ9  4. Vitamin B12 deficiency  - cyanocobalamin (,VITAMIN B-12,) 1000 MCG/ML injection; Inject 1 mL (1,000 mcg total) into the muscle once.  Dispense: 1 mL; Refill: 0  5. Obstructive sleep apnea  - Nocturnal polysomnography (NPSG); Future  6. Acute recurrent sinusitis, unspecified location  - azithromycin (ZITHROMAX) 500 MG tablet; Take 1 tablet (500 mg total) by mouth daily.  Dispense: 3 tablet; Refill: 0  7. Acute bacterial conjunctivitis of both eyes  - ciprofloxacin (CILOXAN) 0.3 % ophthalmic solution; Place 2 drops into both eyes every 2 (two) hours. Administer 1 drop, every 2 hours, while awake, for 2 days. Then 1 drop, every 4 hours, while awake, for the next 5 days.  Dispense: 5 mL; Refill: 0

## 2015-12-18 ENCOUNTER — Other Ambulatory Visit: Payer: Self-pay | Admitting: Family Medicine

## 2015-12-18 DIAGNOSIS — R600 Localized edema: Secondary | ICD-10-CM

## 2015-12-18 DIAGNOSIS — F339 Major depressive disorder, recurrent, unspecified: Secondary | ICD-10-CM

## 2015-12-19 ENCOUNTER — Other Ambulatory Visit: Payer: Self-pay | Admitting: Family Medicine

## 2015-12-19 DIAGNOSIS — E785 Hyperlipidemia, unspecified: Secondary | ICD-10-CM

## 2015-12-19 DIAGNOSIS — I10 Essential (primary) hypertension: Secondary | ICD-10-CM

## 2015-12-25 ENCOUNTER — Other Ambulatory Visit: Payer: Self-pay | Admitting: Family Medicine

## 2016-01-11 ENCOUNTER — Other Ambulatory Visit: Payer: Self-pay | Admitting: Family Medicine

## 2016-01-11 DIAGNOSIS — E559 Vitamin D deficiency, unspecified: Secondary | ICD-10-CM

## 2016-01-11 NOTE — Telephone Encounter (Signed)
Patient requesting refill of Vitamin D to [facility].  

## 2016-01-14 ENCOUNTER — Telehealth: Payer: Self-pay

## 2016-01-14 ENCOUNTER — Other Ambulatory Visit: Payer: Self-pay | Admitting: Family Medicine

## 2016-01-14 DIAGNOSIS — IMO0002 Reserved for concepts with insufficient information to code with codable children: Secondary | ICD-10-CM

## 2016-01-14 DIAGNOSIS — E1165 Type 2 diabetes mellitus with hyperglycemia: Principal | ICD-10-CM

## 2016-01-14 DIAGNOSIS — E1140 Type 2 diabetes mellitus with diabetic neuropathy, unspecified: Secondary | ICD-10-CM

## 2016-01-14 NOTE — Telephone Encounter (Signed)
Patient requesting refill of [place] to [facility].

## 2016-01-14 NOTE — Telephone Encounter (Signed)
She was given a 90 day supply in October with one refill. It was printed. Did she lose the prescription

## 2016-01-16 ENCOUNTER — Other Ambulatory Visit: Payer: Self-pay

## 2016-01-16 ENCOUNTER — Other Ambulatory Visit: Payer: Self-pay | Admitting: Family Medicine

## 2016-01-16 DIAGNOSIS — IMO0002 Reserved for concepts with insufficient information to code with codable children: Secondary | ICD-10-CM

## 2016-01-16 DIAGNOSIS — E1140 Type 2 diabetes mellitus with diabetic neuropathy, unspecified: Secondary | ICD-10-CM

## 2016-01-16 DIAGNOSIS — E1165 Type 2 diabetes mellitus with hyperglycemia: Principal | ICD-10-CM

## 2016-01-16 NOTE — Telephone Encounter (Signed)
Patient requesting refill of Fenofibrate and Trulicity.

## 2016-01-21 ENCOUNTER — Telehealth: Payer: Self-pay | Admitting: Family Medicine

## 2016-01-21 NOTE — Telephone Encounter (Signed)
She was given one sample, we will refill it when she comes in this week

## 2016-01-21 NOTE — Telephone Encounter (Signed)
PT SAID THAT THE PATIENT IS NEEDING TRULICITY REFILLED FOR 90 DAY SUPPLY DUE TO [employer]. IT WILL HAVE TO BE MAIL ORDERED TO [facility]. PT HAS APPT Wednesday.

## 2016-01-23 ENCOUNTER — Ambulatory Visit (INDEPENDENT_AMBULATORY_CARE_PROVIDER_SITE_OTHER): Payer: 59 | Admitting: Family Medicine

## 2016-01-23 ENCOUNTER — Telehealth: Payer: Self-pay | Admitting: General Surgery

## 2016-01-23 ENCOUNTER — Encounter: Payer: Self-pay | Admitting: Family Medicine

## 2016-01-23 VITALS — BP 124/68 | HR 92 | Temp 97.8°F | Resp 16 | Ht 68.0 in | Wt 295.1 lb

## 2016-01-23 DIAGNOSIS — G4733 Obstructive sleep apnea (adult) (pediatric): Secondary | ICD-10-CM

## 2016-01-23 DIAGNOSIS — E1140 Type 2 diabetes mellitus with diabetic neuropathy, unspecified: Secondary | ICD-10-CM

## 2016-01-23 DIAGNOSIS — Z1211 Encounter for screening for malignant neoplasm of colon: Secondary | ICD-10-CM

## 2016-01-23 DIAGNOSIS — E538 Deficiency of other specified B group vitamins: Secondary | ICD-10-CM

## 2016-01-23 DIAGNOSIS — F339 Major depressive disorder, recurrent, unspecified: Secondary | ICD-10-CM | POA: Diagnosis not present

## 2016-01-23 DIAGNOSIS — E1129 Type 2 diabetes mellitus with other diabetic kidney complication: Secondary | ICD-10-CM | POA: Diagnosis not present

## 2016-01-23 DIAGNOSIS — M109 Gout, unspecified: Secondary | ICD-10-CM

## 2016-01-23 DIAGNOSIS — E559 Vitamin D deficiency, unspecified: Secondary | ICD-10-CM

## 2016-01-23 DIAGNOSIS — Z23 Encounter for immunization: Secondary | ICD-10-CM | POA: Diagnosis not present

## 2016-01-23 DIAGNOSIS — Z1231 Encounter for screening mammogram for malignant neoplasm of breast: Secondary | ICD-10-CM | POA: Diagnosis not present

## 2016-01-23 DIAGNOSIS — E782 Mixed hyperlipidemia: Secondary | ICD-10-CM | POA: Diagnosis not present

## 2016-01-23 DIAGNOSIS — Z1239 Encounter for other screening for malignant neoplasm of breast: Secondary | ICD-10-CM

## 2016-01-23 DIAGNOSIS — Z2911 Encounter for prophylactic immunotherapy for respiratory syncytial virus (RSV): Secondary | ICD-10-CM

## 2016-01-23 DIAGNOSIS — R600 Localized edema: Secondary | ICD-10-CM

## 2016-01-23 DIAGNOSIS — I10 Essential (primary) hypertension: Secondary | ICD-10-CM

## 2016-01-23 DIAGNOSIS — R809 Proteinuria, unspecified: Secondary | ICD-10-CM

## 2016-01-23 MED ORDER — VENLAFAXINE HCL ER 150 MG PO CP24: 300.0000 mg | ORAL_CAPSULE | Freq: Every day | ORAL | 1 refills | Status: DC

## 2016-01-23 MED ORDER — CYANOCOBALAMIN 1000 MCG/ML IJ SOLN: 1000.0000 ug | Freq: Once | INTRAMUSCULAR | 0 refills | Status: AC

## 2016-01-23 MED ORDER — FUROSEMIDE 40 MG PO TABS: 40.0000 mg | ORAL_TABLET | Freq: Two times a day (BID) | ORAL | 1 refills | Status: DC | PRN

## 2016-01-23 MED ORDER — LOSARTAN POTASSIUM 100 MG PO TABS: 100.0000 mg | ORAL_TABLET | Freq: Every day | ORAL | 1 refills | Status: DC

## 2016-01-23 MED ORDER — ATORVASTATIN CALCIUM 40 MG PO TABS: 40.0000 mg | ORAL_TABLET | Freq: Every day | ORAL | 1 refills | Status: DC

## 2016-01-23 NOTE — Progress Notes (Signed)
Name: Tina Short   MRN: [id number]    DOB: 04-18-1955   Date:01/23/2016       Progress Note  Subjective  Chief Complaint  Chief Complaint  Patient presents with  . Diabetes    pt has been out of strips so not able to check and ws out of medication for 1 week also  . Hypertension    HPI  DMII: she has a history of DM for many years, seen by Endocrinologist in the past. Last hgbA1C 6.9%. She is currently on Trulicity and Fortamet ( since 10/2015 ) She has lost over 11 lbs in the past 2 montha. She denies polyphagia, she has polydipsia and polyuria. She takes statin, Losartan ( ARB) taking aspirin sometimes, but will add that to her weekly pill dispenser. Doing better with food choices. She has neuropathy, but is doing better since glucose is at goal   Morbid obesity: she has a long history of obesity, she was started on Trulicity in 0000000 and is responding well, no longer over eating and has lost 11 lbs, she ran out of Trulicity for one week and her appetite was not well controlled  Major Depression: Her father committed suicide in his 54's and there are other family members with history of psychiatric illness. She denies suicidal thoughts, but would be fine if she died. She states that she is doing her part in trying to do things even when she does not feel like it. She has not missed any work. She has been shopping a little online, but not getting out of the house for pleasure. We increased Effexor to 300 mg daily in September because of worsening of symptoms of depression and lack of motivation. She states her boss knows she has depression. She states she gets distracted when at work, but feels down and tired when she gets home. She feels guilty because the best part of her is at work. She had one therapy session over the phone with a work counselors. Discussed referral to psychiatrist but she refuses.   OSA: she uses CPAP every night. She has not had any recent CPAP titration , we  scheduled it, but she post-poned that until next year, she thinks it is related to depression  B12 deficiency: she had B12 injection last month but did not noticed a difference in energy, advised to only use SL b12 . B12 today   Gout: no recent episodes    Patient Active Problem List   Diagnosis Date Noted  . Major depression, recurrent, chronic ([place]) 10/30/2015  . Dyslipidemia associated with type 2 diabetes mellitus ([place]) 10/30/2015  . Vitamin D deficiency 07/27/2015  . Vitamin B12 deficiency 07/27/2015  . History of anemia 09/12/2014  . Carpal tunnel syndrome 09/12/2014  . Obstructive sleep apnea 09/12/2014  . Edema extremities 09/12/2014  . Gout 09/12/2014  . Extreme obesity ([place]) 11/15/2009  . Benign essential HTN 10/07/2006  . Type 2 diabetes mellitus, uncontrolled, with neuropathy ([place]) 10/07/2006  . Hyperlipidemia 10/07/2006    Past Surgical History:  Procedure Laterality Date  . ABDOMINAL HYSTERECTOMY    . FOOT SURGERY Right    X2  . SHOULDER SURGERY Right   . WRIST SURGERY Bilateral     Family History  Problem Relation Age of Onset  . Diabetes Mother   . Cancer Mother   . Mental illness Father   . Obesity Daughter   . Diabetes Maternal Grandmother   . Heart disease Brother   . Obesity Daughter  Social History   Social History  . Marital status: Married    Spouse name: N/A  . Number of children: N/A  . Years of education: N/A   Occupational History  . Not on file.   Social History Main Topics  . Smoking status: Never Smoker  . Smokeless tobacco: Never Used  . Alcohol use No  . Drug use: No  . Sexual activity: Yes    Partners: Male   Other Topics Concern  . Not on file   Social History Narrative  . No narrative on file     Current Outpatient Prescriptions:  .  ACCU-CHEK AVIVA PLUS test strip, Test once daily, Disp: 100 each, Rfl: 11 .  atorvastatin (LIPITOR) 40 MG tablet, TAKE 1 TABLET BY MOUTH AT  BEDTIME, Disp: 90 tablet, Rfl:  0 .  ciprofloxacin (CILOXAN) 0.3 % ophthalmic solution, Place 2 drops into both eyes every 2 (two) hours. Administer 1 drop, every 2 hours, while awake, for 2 days. Then 1 drop, every 4 hours, while awake, for the next 5 days., Disp: 5 mL, Rfl: 0 .  Cyanocobalamin (B-12-SL) 1000 MCG SUBL, Place 1 tablet (1,000 mcg total) under the tongue daily., Disp: 90 tablet, Rfl: 1 .  Dulaglutide (TRULICITY) 1.5 0000000 SOPN, Inject 1.5 mg into the skin once a week., Disp: 12 pen, Rfl: 1 .  furosemide (LASIX) 40 MG tablet, TAKE 1 TABLET BY MOUTH  DAILY, Disp: 90 tablet, Rfl: 0 .  losartan (COZAAR) 100 MG tablet, TAKE 1 TABLET BY MOUTH  DAILY, Disp: 90 tablet, Rfl: 0 .  metformin (FORTAMET) 1000 MG (OSM) 24 hr tablet, Take 2 tablets (2,000 mg total) by mouth daily with breakfast., Disp: 180 tablet, Rfl: 1 .  Multiple Vitamin (MULTIVITAMIN) capsule, Take 1 capsule by mouth daily., Disp: , Rfl:  .  omega-3 acid ethyl esters (LOVAZA) 1 g capsule, Take 2 capsules (2 g total) by mouth 2 (two) times daily., Disp: 120 capsule, Rfl: 2 .  potassium chloride SA (K-DUR,KLOR-CON) 20 MEQ tablet, Take 1 tablet by mouth  every day, Disp: 90 tablet, Rfl: 0 .  venlafaxine XR (EFFEXOR-XR) 150 MG 24 hr capsule, TAKE 2 CAPSULES BY MOUTH  DAILY WITH BREAKFAST., Disp: 180 capsule, Rfl: 0 .  Vitamin D, Ergocalciferol, (DRISDOL) 50000 units CAPS capsule, TAKE 1 CAPSULE BY MOUTH  EVERY 7 DAYS, Disp: 12 capsule, Rfl: 0  Allergies  Allergen Reactions  . Abilify  [Aripiprazole]     Other reaction(s): Difficulty breathing  . Invokana [Canagliflozin] Itching    Yeast infections     ROS  Constitutional: Negative for fever or weight change.  Respiratory: Negative for cough and shortness of breath.   Cardiovascular: Negative for chest pain or palpitations.  Gastrointestinal: Negative for abdominal pain, no bowel changes.  Musculoskeletal: Negative for gait problem or joint swelling.  Skin: Negative for rash.  Neurological: Negative  for dizziness or headache.  No other specific complaints in a complete review of systems (except as listed in HPI above).  Objective  Vitals:   01/23/16 1130  BP: 124/68  Pulse: 92  Resp: 16  Temp: 97.8 F (36.6 C)  SpO2: 95%  Weight: 295 lb 2 oz (133.9 kg)  Height: 5\' 8"  (1.727 m)    Body mass index is 44.87 kg/m.  Physical Exam  Constitutional: Patient appears well-developed and well-nourished. Obese  No distress.  HEENT: head atraumatic, normocephalic, pupils equal and reactive to light,  neck supple, throat within normal limits Cardiovascular: Normal rate, regular  rhythm and normal heart sounds.  No murmur heard.Trace  BLE edema. Pulmonary/Chest: Effort normal and breath sounds normal. No respiratory distress. Abdominal: Soft.  There is no tenderness. Psychiatric: Patient has a normal mood and affect. behavior is normal. Judgment and thought content normal.  Recent Results (from the past 2160 hour(s))  Vitamin D (25 hydroxy)     Status: Abnormal   Collection Time: 11/19/15  4:39 PM  Result Value Ref Range   Vit D, 25-Hydroxy 18.0 (L) 30.0 - 100.0 ng/mL    Comment: Vitamin D deficiency has been defined by the [place] practice guideline as a level of serum 25-OH vitamin D less than 20 ng/mL (1,2). The Endocrine Society went on to further define vitamin D insufficiency as a level between 21 and 29 ng/mL (2). 1. IOM (Institute of Medicine). 2010. Dietary reference    intakes for calcium and D. [place]: The    [employer]. 2. Holick MF, Binkley , Bischoff-Ferrari HA, et al.    Evaluation, treatment, and prevention of vitamin D    deficiency: an Endocrine Society clinical practice    guideline. JCEM. 2011 Jul; 96(7):1911-30.   B12     Status: None   Collection Time: 11/19/15  4:39 PM  Result Value Ref Range   Vitamin B-12 522 211 - 946 pg/mL  Uric acid     Status: Abnormal   Collection Time: 11/19/15  4:39  PM  Result Value Ref Range   Uric Acid 7.3 (H) 2.5 - 7.1 mg/dL    Comment:            Therapeutic target for gout patients: <6.0  Lipid Profile     Status: Abnormal   Collection Time: 11/19/15  4:39 PM  Result Value Ref Range   Cholesterol, Total 150 100 - 199 mg/dL   Triglycerides 424 (H) 0 - 149 mg/dL   HDL 28 (L) >39 mg/dL   VLDL Cholesterol Cal Comment 5 - 40 mg/dL    Comment: The calculation for the VLDL cholesterol is not valid when triglyceride level is >400 mg/dL.    LDL Calculated Comment 0 - 99 mg/dL    Comment: Triglyceride result indicated is too high for an accurate LDL cholesterol estimation.    Chol/HDL Ratio 5.4 (H) 0.0 - 4.4 ratio units    Comment:                                   T. Chol/HDL Ratio                                             Men  Women                               1/2 Avg.Risk  3.4    3.3                                   Avg.Risk  5.0    4.4  2X Avg.Risk  9.6    7.1                                3X Avg.Risk 23.4   11.0   Hepatitis C Antibody     Status: None   Collection Time: 11/19/15  4:39 PM  Result Value Ref Range   Hep C Virus Ab 0.1 0.0 - 0.9 s/co ratio    Comment:                                   Negative:     < 0.8                              Indeterminate: 0.8 - 0.9                                   Positive:     > 0.9  The CDC recommends that a positive HCV antibody result  be followed up with a HCV Nucleic Acid Amplification  test [id number]).   HgB A1c     Status: Abnormal   Collection Time: 11/19/15  4:39 PM  Result Value Ref Range   Hgb A1c MFr Bld 6.9 (H) 4.8 - 5.6 %    Comment:          Pre-diabetes: 5.7 - 6.4          Diabetes: >6.4          Glycemic control for adults with diabetes: <7.0    Est. average glucose Bld gHb Est-mCnc 151 mg/dL  Comprehensive Metabolic Panel (CMET)     Status: Abnormal   Collection Time: 11/19/15  4:39 PM  Result Value Ref Range   Glucose 129 (H) 65 - 99  mg/dL   BUN 12 8 - 27 mg/dL   Creatinine, Ser 0.77 0.57 - 1.00 mg/dL   GFR calc non Af Amer 84 >59 mL/min/1.73   GFR calc Af Amer 96 >59 mL/min/1.73   BUN/Creatinine Ratio 16 12 - 28   Sodium 142 134 - 144 mmol/L   Potassium 4.3 3.5 - 5.2 mmol/L   Chloride 99 96 - 106 mmol/L   CO2 21 18 - 29 mmol/L   Calcium 10.1 8.7 - 10.3 mg/dL   Total Protein 7.6 6.0 - 8.5 g/dL   Albumin 4.7 3.6 - 4.8 g/dL   Globulin, Total 2.9 1.5 - 4.5 g/dL   Albumin/Globulin Ratio 1.6 1.2 - 2.2   Bilirubin Total 0.4 0.0 - 1.2 mg/dL   Alkaline Phosphatase 54 39 - 117 IU/L   AST 40 0 - 40 IU/L   ALT 39 (H) 0 - 32 IU/L      PHQ2/9: Depression screen [facility] 2/9 01/23/2016 11/23/2015 10/30/2015 12/27/2014  Decreased Interest 0 1 3 0  Down, Depressed, Hopeless 0 1 3 0  PHQ - 2 Score 0 2 6 0  Altered sleeping - 1 0 -  Tired, decreased energy - 1 1 -  Change in appetite - 0 0 -  Feeling bad or failure about yourself  - 0 3 -  Trouble concentrating - 0 1 -  Moving slowly or fidgety/restless - 0 0 -  Suicidal thoughts - 0 0 -  PHQ-9 Score -  4 11 -  Difficult doing work/chores - Somewhat difficult Not difficult at all -     Fall Risk: Fall Risk  01/23/2016 11/23/2015 10/30/2015 12/27/2014 10/24/2014  Falls in the past year? No No No No No     Functional Status Survey: Is the patient deaf or have difficulty hearing?: No Does the patient have difficulty seeing, even when wearing glasses/contacts?: No Does the patient have difficulty concentrating, remembering, or making decisions?: No Does the patient have difficulty walking or climbing stairs?: No Does the patient have difficulty dressing or bathing?: No Does the patient have difficulty doing errands alone such as visiting a doctor's office or shopping?: No    Assessment & Plan  1. Controlled type 2 diabetes mellitus with microalbuminuria, without long-term current use of insulin (HCC)  - Hemoglobin A1c - Urine Microalbumin w/creat. ratio  2. Morbid  obesity, unspecified obesity type [facility])  Discussed with the patient the risk posed by an increased BMI. Discussed importance of portion control, calorie counting and at least 150 minutes of physical activity weekly. Avoid sweet beverages and drink more water. Eat at least 6 servings of fruit and vegetables daily   3. Type 2 diabetes, controlled, with neuropathy (HCC)  - Hemoglobin A1c  4. Major depression, recurrent, chronic (HCC)  - venlafaxine XR (EFFEXOR-XR) 150 MG 24 hr capsule; Take 2 capsules (300 mg total) by mouth daily with breakfast.  Dispense: 180 capsule; Refill: 1  5. Benign essential HTN  - losartan (COZAAR) 100 MG tablet; Take 1 tablet (100 mg total) by mouth daily.  Dispense: 90 tablet; Refill: 1 - Comprehensive metabolic panel  6. Obstructive sleep apnea  Continue wearing CPAP every night  7. Edema extremities  - furosemide (LASIX) 40 MG tablet; Take 1 tablet (40 mg total) by mouth 2 (two) times daily as needed.  Dispense: 100 tablet; Refill: 1  8. Mixed dyslipidemia  - atorvastatin (LIPITOR) 40 MG tablet; Take 1 tablet (40 mg total) by mouth at bedtime.  Dispense: 90 tablet; Refill: 1 - Lipid panel  9. Colon cancer screening  - Ambulatory referral to Gastroenterology  10. Vitamin B12 deficiency  - cyanocobalamin (,VITAMIN B-12,) 1000 MCG/ML injection; Inject 1 mL (1,000 mcg total) into the muscle once.  Dispense: 1 mL; Refill: 0 - Vitamin B12  11. Breast cancer screening  - MM Digital Screening; Future  12. Need for shingles vaccine  - Varicella-zoster vaccine subcutaneous  13. Vitamin D deficiency  - VITAMIN D 25 Hydroxy (Vit-D Deficiency, Fractures)  14. Controlled gout  - Uric acid

## 2016-01-23 NOTE — Telephone Encounter (Signed)
01-23-16 L/M ON H&C#'S FOR PT TO CALL & SCHEDULE AN APPT FOR SCREENING COLONOSCOPY REF FROM DR Ancil Boozer.[employer] INS/MTH

## 2016-01-24 ENCOUNTER — Other Ambulatory Visit: Payer: Self-pay | Admitting: Family Medicine

## 2016-01-24 LAB — HEMOGLOBIN A1C
Est. average glucose Bld gHb Est-mCnc: 140 mg/dL
Hgb A1c MFr Bld: 6.5 % — ABNORMAL HIGH (ref 4.8–5.6)

## 2016-01-24 LAB — LIPID PANEL
CHOLESTEROL TOTAL: 138 mg/dL (ref 100–199)
Chol/HDL Ratio: 3.6 ratio units (ref 0.0–4.4)
HDL: 38 mg/dL — ABNORMAL LOW (ref >39)
LDL CALC: 39 mg/dL (ref 0–99)
Triglycerides: 306 mg/dL — ABNORMAL HIGH (ref 0–149)
VLDL Cholesterol Cal: 61 mg/dL — ABNORMAL HIGH (ref 5–40)

## 2016-01-24 LAB — COMPREHENSIVE METABOLIC PANEL
A/G RATIO: 1.7 (ref 1.2–2.2)
ALK PHOS: 60 IU/L (ref 39–117)
ALT: 31 IU/L (ref 0–32)
AST: 35 IU/L (ref 0–40)
Albumin: 4.3 g/dL (ref 3.6–4.8)
BUN/Creatinine Ratio: 20 (ref 12–28)
BUN: 11 mg/dL (ref 8–27)
Bilirubin Total: 0.7 mg/dL (ref 0.0–1.2)
CO2: 24 mmol/L (ref 18–29)
Calcium: 9.3 mg/dL (ref 8.7–10.3)
Chloride: 102 mmol/L (ref 96–106)
Creatinine, Ser: 0.54 mg/dL — ABNORMAL LOW (ref 0.57–1.00)
GFR calc Af Amer: 118 mL/min/{1.73_m2} (ref >59)
GFR calc non Af Amer: 102 mL/min/{1.73_m2} (ref >59)
GLOBULIN, TOTAL: 2.5 g/dL (ref 1.5–4.5)
Glucose: 169 mg/dL — ABNORMAL HIGH (ref 65–99)
POTASSIUM: 4.3 mmol/L (ref 3.5–5.2)
SODIUM: 143 mmol/L (ref 134–144)
Total Protein: 6.8 g/dL (ref 6.0–8.5)

## 2016-01-24 LAB — MICROALBUMIN / CREATININE URINE RATIO
CREATININE, UR: 146 mg/dL
MICROALBUM., U, RANDOM: 13.2 ug/mL
Microalb/Creat Ratio: 9 mg/g creat (ref 0.0–30.0)

## 2016-01-24 LAB — VITAMIN D 25 HYDROXY (VIT D DEFICIENCY, FRACTURES): VIT D 25 HYDROXY: 22.9 ng/mL — AB (ref 30.0–100.0)

## 2016-01-24 LAB — URIC ACID: Uric Acid: 9.6 mg/dL — ABNORMAL HIGH (ref 2.5–7.1)

## 2016-01-24 LAB — VITAMIN B12: VITAMIN B 12: 305 pg/mL (ref 232–1245)

## 2016-01-24 MED ORDER — ALLOPURINOL 100 MG PO TABS: 100.0000 mg | ORAL_TABLET | Freq: Two times a day (BID) | ORAL | 1 refills | Status: DC

## 2016-02-06 NOTE — Telephone Encounter (Signed)
02-06-16 @11 :00 L/M ON H&C#'S FOR PT TO RETURN CALL & SCHEDULE AN APPT/MTH

## 2016-02-07 ENCOUNTER — Other Ambulatory Visit: Payer: Self-pay | Admitting: Family Medicine

## 2016-02-07 DIAGNOSIS — E1165 Type 2 diabetes mellitus with hyperglycemia: Principal | ICD-10-CM

## 2016-02-07 DIAGNOSIS — E1140 Type 2 diabetes mellitus with diabetic neuropathy, unspecified: Secondary | ICD-10-CM

## 2016-02-07 DIAGNOSIS — IMO0002 Reserved for concepts with insufficient information to code with codable children: Secondary | ICD-10-CM

## 2016-02-08 ENCOUNTER — Other Ambulatory Visit: Payer: Self-pay

## 2016-02-08 DIAGNOSIS — IMO0002 Reserved for concepts with insufficient information to code with codable children: Secondary | ICD-10-CM

## 2016-02-08 DIAGNOSIS — E1165 Type 2 diabetes mellitus with hyperglycemia: Principal | ICD-10-CM

## 2016-02-08 DIAGNOSIS — E1140 Type 2 diabetes mellitus with diabetic neuropathy, unspecified: Secondary | ICD-10-CM

## 2016-02-08 NOTE — Telephone Encounter (Signed)
Patient requesting refill of Trulicity to [facility].  

## 2016-02-08 NOTE — Telephone Encounter (Signed)
Patient requesting refill of Metformin to [facility].

## 2016-02-09 MED ORDER — DULAGLUTIDE 1.5 MG/0.5ML ~~LOC~~ SOAJ: 1.5000 mg | SUBCUTANEOUS | 1 refills | Status: DC

## 2016-02-13 ENCOUNTER — Encounter: Payer: Self-pay | Admitting: *Deleted

## 2016-03-12 ENCOUNTER — Other Ambulatory Visit: Payer: Self-pay | Admitting: Family Medicine

## 2016-03-12 DIAGNOSIS — R600 Localized edema: Secondary | ICD-10-CM

## 2016-03-13 ENCOUNTER — Other Ambulatory Visit: Payer: Self-pay | Admitting: Family Medicine

## 2016-04-19 ENCOUNTER — Other Ambulatory Visit: Payer: Self-pay | Admitting: Family Medicine

## 2016-04-19 DIAGNOSIS — E559 Vitamin D deficiency, unspecified: Secondary | ICD-10-CM

## 2016-04-21 ENCOUNTER — Encounter: Payer: Self-pay | Admitting: *Deleted

## 2016-04-21 ENCOUNTER — Other Ambulatory Visit: Payer: Self-pay

## 2016-04-21 NOTE — Telephone Encounter (Signed)
Patient requesting refill of Fenofibrate to [facility].

## 2016-04-22 ENCOUNTER — Ambulatory Visit: Payer: 59 | Admitting: Family Medicine

## 2016-04-23 ENCOUNTER — Other Ambulatory Visit: Payer: Self-pay

## 2016-04-23 ENCOUNTER — Other Ambulatory Visit: Payer: Self-pay | Admitting: Family Medicine

## 2016-04-23 ENCOUNTER — Telehealth: Payer: Self-pay | Admitting: Family Medicine

## 2016-04-23 DIAGNOSIS — E1165 Type 2 diabetes mellitus with hyperglycemia: Principal | ICD-10-CM

## 2016-04-23 DIAGNOSIS — E1140 Type 2 diabetes mellitus with diabetic neuropathy, unspecified: Secondary | ICD-10-CM

## 2016-04-23 DIAGNOSIS — IMO0002 Reserved for concepts with insufficient information to code with codable children: Secondary | ICD-10-CM

## 2016-04-23 MED ORDER — METFORMIN HCL ER (OSM) 1000 MG PO TB24: ORAL_TABLET | ORAL | 1 refills | Status: DC

## 2016-04-23 NOTE — Telephone Encounter (Signed)
PT APPT WAS CANCELLED DUE TO WEATHER THIS WEEK AND HAS RESCHEDULED FOR April 10 BUT IS NEEDING HER RX REFILLED. PHARM USES [facility]. MAY ALREADY HAVE A REQUEST FROM THEM.

## 2016-04-23 NOTE — Telephone Encounter (Signed)
She only needs Metformin , sent to pharmacy

## 2016-04-23 NOTE — Telephone Encounter (Signed)
Pt can not get off work in a short notice. Per our discussion you said that you would just

## 2016-04-23 NOTE — Telephone Encounter (Signed)
Can you see if she can come in today at 1?

## 2016-04-23 NOTE — Telephone Encounter (Signed)
The dr is calling in her medication

## 2016-04-29 ENCOUNTER — Encounter: Payer: Self-pay | Admitting: General Surgery

## 2016-04-29 ENCOUNTER — Ambulatory Visit (INDEPENDENT_AMBULATORY_CARE_PROVIDER_SITE_OTHER): Payer: 59 | Admitting: General Surgery

## 2016-04-29 VITALS — BP 112/64 | HR 92 | Resp 16 | Ht 68.0 in | Wt 292.0 lb

## 2016-04-29 DIAGNOSIS — Z1211 Encounter for screening for malignant neoplasm of colon: Secondary | ICD-10-CM | POA: Diagnosis not present

## 2016-04-29 MED ORDER — POLYETHYLENE GLYCOL 3350 17 GM/SCOOP PO POWD: 1.0000 | Freq: Once | ORAL | 0 refills | Status: AC

## 2016-04-29 NOTE — Progress Notes (Signed)
Patient ID: Tina Short, female   DOB: 08-15-1955, 61 y.o.   MRN: [id number]  Chief Complaint  Patient presents with  . Colonoscopy    HPI Tina Short is a 61 y.o. female.  Who presents for a colonoscopy discussion. She is unsure when her last colonoscopy was completed. Denies any gastrointestinal issues. Bowels move regular and no bleeding noted. She states her diabetic meds (Trulicity) cause her to have loose BM's, no bleeding. She states she needs to get a new meter and has not been checking her blood sugars. Her last A1C was 6.5.  HPI  Past Medical History:  Diagnosis Date  . Depression   . Diabetes ([place])   . Hyperlipidemia   . Hypertension   . Neuropathy due to type 2 diabetes mellitus ([place])   . Obesity, morbid ([place]) 11/15/2009  . Sleep apnea    cpap  . Swelling     Past Surgical History:  Procedure Laterality Date  . ABDOMINAL HYSTERECTOMY    . COLONOSCOPY    . FOOT SURGERY Right    X2  . SHOULDER SURGERY Right   . WRIST SURGERY Bilateral     Family History  Problem Relation Age of Onset  . Diabetes Mother   . Cancer Mother     uterine  . Mental illness Father   . Obesity Daughter   . Diabetes Maternal Grandmother   . Heart disease Brother   . Obesity Daughter     Social History Social History  Substance Use Topics  . Smoking status: Never Smoker  . Smokeless tobacco: Never Used  . Alcohol use No    Allergies  Allergen Reactions  . Abilify  [Aripiprazole]     Other reaction(s): Difficulty breathing  . Invokana [Canagliflozin] Itching    Yeast infections    Current Outpatient Prescriptions  Medication Sig Dispense Refill  . ACCU-CHEK AVIVA PLUS test strip Test once daily 100 each 11  . allopurinol (ZYLOPRIM) 100 MG tablet TAKE 1 TABLET BY MOUTH TWO  TIMES DAILY 180 tablet 1  . aspirin EC 81 MG tablet Take 81 mg by mouth daily.    Marland Kitchen atorvastatin (LIPITOR) 40 MG tablet Take 1 tablet (40 mg total) by mouth at bedtime. 90 tablet 1  .  Dulaglutide (TRULICITY) 1.5 OH/6.0VP SOPN Inject 1.5 mg into the skin once a week. 12 pen 1  . furosemide (LASIX) 40 MG tablet TAKE 1 TABLET BY MOUTH TWO  TIMES DAILY AS NEEDED 180 tablet 0  . losartan (COZAAR) 100 MG tablet Take 1 tablet (100 mg total) by mouth daily. 90 tablet 1  . metformin (FORTAMET) 1000 MG (OSM) 24 hr tablet TAKE 2 TABLETS BY MOUTH  DAILY WITH BREAKFAST 180 tablet 1  . Multiple Vitamin (MULTIVITAMIN) capsule Take 1 capsule by mouth daily.    Marland Kitchen omega-3 acid ethyl esters (LOVAZA) 1 g capsule TAKE 2 CAPSULES BY MOUTH  TWO TIMES DAILY 360 capsule 1  . potassium chloride SA (K-DUR,KLOR-CON) 20 MEQ tablet Take 1 tablet by mouth  every day 90 tablet 0  . venlafaxine XR (EFFEXOR-XR) 150 MG 24 hr capsule Take 2 capsules (300 mg total) by mouth daily with breakfast. 180 capsule 1  . Vitamin D, Ergocalciferol, (DRISDOL) 50000 units CAPS capsule TAKE 1 CAPSULE BY MOUTH  EVERY 7 DAYS 12 capsule 1   No current facility-administered medications for this visit.     Review of Systems Review of Systems  Constitutional: Negative.   Respiratory: Negative.   Cardiovascular:  Negative.     Blood pressure 112/64, pulse 92, resp. rate 16, height 5\' 8"  (1.727 m), weight 292 lb (132.5 kg).  Physical Exam Physical Exam  Constitutional: She is oriented to person, place, and time. She appears well-developed and well-nourished.  HENT:  Mouth/Throat: Oropharynx is clear and moist.  Eyes: Conjunctivae are normal. No scleral icterus.  Neck: Neck supple.  Cardiovascular: Normal rate, regular rhythm and normal heart sounds.   Pulmonary/Chest: Effort normal and breath sounds normal.  Abdominal: Soft.  Lymphadenopathy:    She has no cervical adenopathy.  Neurological: She is alert and oriented to person, place, and time.  Skin: Skin is warm and dry.  Psychiatric: Her behavior is normal.      Assessment     Candidate for screening colonoscopy.    Plan        Hold metformin day of  prep and procedure. She will bring her CPAP.  Colonoscopy with possible biopsy/polypectomy prn: Information regarding the procedure, including its potential risks and complications (including but not limited to perforation of the bowel, which may require emergency surgery to repair, and bleeding) was verbally given to the patient. Educational information regarding lower intestinal endoscopy was given to the patient. Written instructions for how to complete the bowel prep using Miralax were provided. The importance of drinking ample fluids to avoid dehydration as a result of the prep emphasized.  The patient is scheduled for a Colonoscopy at [facility] on 05/14/16. They are aware to call the day before to get their arrival time. She will stop her omega 3 supplements 1 week prior. She will hold her Metformin the day of prep and procedure. She will only take her blood pressure medication at 6 am with a sip of water the morning of. She may continue her 81 mg aspirin. She knows to bring her C-Pap machine with her the day of. Miralax prescription has been sent into the patient's pharmacy. The patient is aware of date and instructions.    This information has been scribed by Karie Fetch RN, BSN,BC.   [name] 04/30/2016, 8:00 PM

## 2016-04-29 NOTE — Patient Instructions (Addendum)
The patient is aware to call back for any questions or concerns. Hold metformin day of prep and procedure.  Colonoscopy, Adult A colonoscopy is an exam to look at the entire large intestine. During the exam, a lubricated, bendable tube is inserted into the anus and then passed into the rectum, colon, and other parts of the large intestine. A colonoscopy is often done as a part of normal colorectal screening or in response to certain symptoms, such as anemia, persistent diarrhea, abdominal pain, and blood in the stool. The exam can help screen for and diagnose medical problems, including:  Tumors.  Polyps.  Inflammation.  Areas of bleeding. Tell a health care provider about:  Any allergies you have.  All medicines you are taking, including vitamins, herbs, eye drops, creams, and over-the-counter medicines.  Any problems you or family members have had with anesthetic medicines.  Any blood disorders you have.  Any surgeries you have had.  Any medical conditions you have.  Any problems you have had passing stool. What are the risks? Generally, this is a safe procedure. However, problems may occur, including:  Bleeding.  A tear in the intestine.  A reaction to medicines given during the exam.  Infection (rare). What happens before the procedure? Eating and drinking restrictions  Follow instructions from your health care provider about eating and drinking, which may include:  A few days before the procedure - follow a low-fiber diet. Avoid nuts, seeds, dried fruit, raw fruits, and vegetables.  1-3 days before the procedure - follow a clear liquid diet. Drink only clear liquids, such as clear broth or bouillon, black coffee or tea, clear juice, clear soft drinks or sports drinks, gelatin dessert, and popsicles. Avoid any liquids that contain red or purple dye.  On the day of the procedure - do not eat or drink anything during the 2 hours before the procedure, or within the  time period that your health care provider recommends. Bowel prep  If you were prescribed an oral bowel prep to clean out your colon:  Take it as told by your health care provider. Starting the day before your procedure, you will need to drink a large amount of medicated liquid. The liquid will cause you to have multiple loose stools until your stool is almost clear or light green.  If your skin or anus gets irritated from diarrhea, you may use these to relieve the irritation:  Medicated wipes, such as adult wet wipes with aloe and vitamin E.  A skin soothing-product like petroleum jelly.  If you vomit while drinking the bowel prep, take a break for up to 60 minutes and then begin the bowel prep again. If vomiting continues and you cannot take the bowel prep without vomiting, call your health care provider. General instructions   Ask your health care provider about changing or stopping your regular medicines. This is especially important if you are taking diabetes medicines or blood thinners.  Plan to have someone take you home from the hospital or clinic. What happens during the procedure?  An IV tube may be inserted into one of your veins.  You will be given medicine to help you relax (sedative).  To reduce your risk of infection:  Your health care team will wash or sanitize their hands.  Your anal area will be washed with soap.  You will be asked to lie on your side with your knees bent.  Your health care provider will lubricate a long, thin, flexible tube. The  tube will have a camera and a light on the end.  The tube will be inserted into your anus.  The tube will be gently eased through your rectum and colon.  Air will be delivered into your colon to keep it open. You may feel some pressure or cramping.  The camera will be used to take images during the procedure.  A small tissue sample may be removed from your body to be examined under a microscope (biopsy). If any  potential problems are found, the tissue will be sent to a lab for testing.  If small polyps are found, your health care provider may remove them and have them checked for cancer cells.  The tube that was inserted into your anus will be slowly removed. The procedure may vary among health care providers and hospitals. What happens after the procedure?  Your blood pressure, heart rate, breathing rate, and blood oxygen level will be monitored until the medicines you were given have worn off.  Do not drive for 24 hours after the exam.  You may have a small amount of blood in your stool.  You may pass gas and have mild abdominal cramping or bloating due to the air that was used to inflate your colon during the exam.  It is up to you to get the results of your procedure. Ask your health care provider, or the department performing the procedure, when your results will be ready. This information is not intended to replace advice given to you by your health care provider. Make sure you discuss any questions you have with your health care provider. Document Released: 01/25/2000 Document Revised: 11/28/2015 Document Reviewed: 04/10/2015 Elsevier Interactive Patient Education  2017 [employer].  The patient is scheduled for a Colonoscopy at [facility] on 05/14/16. They are aware to call the day before to get their arrival time. She will stop her omega 3 supplements 1 week prior. She will hold her Metformin the day of prep and procedure. She will only take her blood pressure medication at 6 am with a sip of water the morning of. She may continue her 81 mg aspirin. She knows to bring her C-Pap machine with her the day of. Miralax prescription has been sent into the patient's pharmacy. The patient is aware of date and instructions.

## 2016-04-30 DIAGNOSIS — Z1211 Encounter for screening for malignant neoplasm of colon: Secondary | ICD-10-CM | POA: Insufficient documentation

## 2016-05-14 ENCOUNTER — Encounter: Payer: Self-pay | Admitting: *Deleted

## 2016-05-14 ENCOUNTER — Encounter
Admission: RE | Disposition: A | Discharge status: Home / Self Care | Payer: Self-pay | Source: Ambulatory Visit | Attending: General Surgery

## 2016-05-14 ENCOUNTER — Ambulatory Visit: Payer: 59 | Admitting: Certified Registered Nurse Anesthetist

## 2016-05-14 ENCOUNTER — Ambulatory Visit
Admission: RE | Admit: 2016-05-14 | Discharge: 2016-05-14 | Disposition: A | Discharge status: Home / Self Care | Payer: 59 | Source: Ambulatory Visit | Attending: General Surgery | Admitting: General Surgery

## 2016-05-14 DIAGNOSIS — Z1211 Encounter for screening for malignant neoplasm of colon: Secondary | ICD-10-CM | POA: Diagnosis not present

## 2016-05-14 DIAGNOSIS — E785 Hyperlipidemia, unspecified: Secondary | ICD-10-CM | POA: Diagnosis not present

## 2016-05-14 DIAGNOSIS — Z7984 Long term (current) use of oral hypoglycemic drugs: Secondary | ICD-10-CM | POA: Insufficient documentation

## 2016-05-14 DIAGNOSIS — K5730 Diverticulosis of large intestine without perforation or abscess without bleeding: Secondary | ICD-10-CM | POA: Diagnosis not present

## 2016-05-14 DIAGNOSIS — E1140 Type 2 diabetes mellitus with diabetic neuropathy, unspecified: Secondary | ICD-10-CM | POA: Diagnosis not present

## 2016-05-14 DIAGNOSIS — I10 Essential (primary) hypertension: Secondary | ICD-10-CM | POA: Insufficient documentation

## 2016-05-14 DIAGNOSIS — Z7982 Long term (current) use of aspirin: Secondary | ICD-10-CM | POA: Diagnosis not present

## 2016-05-14 DIAGNOSIS — G4730 Sleep apnea, unspecified: Secondary | ICD-10-CM | POA: Diagnosis not present

## 2016-05-14 DIAGNOSIS — Z79899 Other long term (current) drug therapy: Secondary | ICD-10-CM | POA: Insufficient documentation

## 2016-05-14 DIAGNOSIS — Z6841 Body Mass Index (BMI) 40.0 and over, adult: Secondary | ICD-10-CM | POA: Diagnosis not present

## 2016-05-14 DIAGNOSIS — F329 Major depressive disorder, single episode, unspecified: Secondary | ICD-10-CM | POA: Insufficient documentation

## 2016-05-14 HISTORY — PX: COLONOSCOPY WITH PROPOFOL: SHX5780

## 2016-05-14 LAB — GLUCOSE, CAPILLARY: GLUCOSE-CAPILLARY: 157 mg/dL — AB (ref 65–99)

## 2016-05-14 SURGERY — COLONOSCOPY WITH PROPOFOL
Anesthesia: General

## 2016-05-14 MED ORDER — SODIUM CHLORIDE 0.9 % IV SOLN
INTRAVENOUS | Status: DC
  Administered 2016-05-14: 12:00:00 via INTRAVENOUS

## 2016-05-14 MED ORDER — PROPOFOL 10 MG/ML IV BOLUS
INTRAVENOUS | Status: AC
  Filled 2016-05-14: qty 20

## 2016-05-14 MED ORDER — PROPOFOL 10 MG/ML IV BOLUS
INTRAVENOUS | Status: DC | PRN
  Administered 2016-05-14 (×3): 30 mg via INTRAVENOUS
  Administered 2016-05-14: 60 mg via INTRAVENOUS

## 2016-05-14 MED ORDER — PROPOFOL 10 MG/ML IV BOLUS
INTRAVENOUS | Status: AC
Start: 2016-05-14 — End: 2016-05-14
  Filled 2016-05-14: qty 20

## 2016-05-14 MED ORDER — PROPOFOL 500 MG/50ML IV EMUL
INTRAVENOUS | Status: AC
  Filled 2016-05-14: qty 50

## 2016-05-14 MED ORDER — PROPOFOL 500 MG/50ML IV EMUL
INTRAVENOUS | Status: AC
Start: 2016-05-14 — End: 2016-05-14
  Filled 2016-05-14: qty 50

## 2016-05-14 MED ORDER — PROPOFOL 500 MG/50ML IV EMUL
INTRAVENOUS | Status: DC | PRN
  Administered 2016-05-14: 150 ug/kg/min via INTRAVENOUS

## 2016-05-14 MED ORDER — GLYCOPYRROLATE 0.2 MG/ML IJ SOLN
INTRAMUSCULAR | Status: DC | PRN
  Administered 2016-05-14: 0.2 mg via INTRAVENOUS

## 2016-05-14 NOTE — Progress Notes (Signed)
Incomplete exam. Only able to reach the proximal transverse colon. Will review options for right colon assessment based on the biopsy results. Reviewed w/ family.

## 2016-05-14 NOTE — Anesthesia Post-op Follow-up Note (Cosign Needed)
Anesthesia QCDR form completed.        

## 2016-05-14 NOTE — Op Note (Signed)
[facility] Gastroenterology Patient Name: Tina Short Procedure Date: 05/14/2016 12:42 PM MRN: [id number] Account #: [id number] Date of Birth: 1955/10/19 Admit Type: Outpatient Age: 61 Room: [facility] ENDO ROOM 1 Gender: Female Note Status: Finalized Procedure:            Colonoscopy Indications:          Screening for colorectal malignant neoplasm Providers:            [name], MD Referring MD:         Bethena Roys. Sowles, MD (Referring MD) Medicines:            Monitored Anesthesia Care Complications:        No immediate complications. Procedure:            Pre-Anesthesia Assessment:                       - Prior to the procedure, a History and Physical was                        performed, and patient medications, allergies and                        sensitivities were reviewed. The patient's tolerance of                        previous anesthesia was reviewed.                       - The risks and benefits of the procedure and the                        sedation options and risks were discussed with the                        patient. All questions were answered and informed                        consent was obtained.                       After obtaining informed consent, the colonoscope was                        passed under direct vision. Throughout the procedure,                        the patient's blood pressure, pulse, and oxygen                        saturations were monitored continuously. The procedure                        was aborted. The colonscope was not inserted.                        Medications were given. The colonoscopy was technically                        difficult and complex due to significant looping and a  tortuous colon. Successful completion of the procedure                        was aided by changing the patient to a supine position,                        using manual pressure, withdrawing and  reinserting the                        scope and straightening and shortening the scope to                        obtain bowel loop reduction. The patient tolerated the                        procedure well. The quality of the bowel preparation                        was excellent.                       In spite of all manovers, it was not possible to                        intubate the ascending colon. Findings:      A few small-mouthed diverticula were found in the sigmoid colon.      A 6 mm polyp was found in the sigmoid colon. The polyp was sessile.       Biopsies were taken with a cold forceps for histology.      The retroflexed view of the distal rectum and anal verge was normal and       showed no anal or rectal abnormalities. Impression:           - Diverticulosis in the sigmoid colon.                       - One 6 mm polyp in the sigmoid colon. Biopsied.                       - The distal rectum and anal verge are normal on                        retroflexion view. Recommendation:       - Telephone endoscopist for pathology results in 1 week. Diagnosis Code(s):    --- Professional ---                       Z12.11, Encounter for screening for malignant neoplasm                        of colon                       D12.5, Benign neoplasm of sigmoid colon                       K57.30, Diverticulosis of large intestine without                        perforation or abscess without bleeding [name], MD  05/14/2016 2:57:44 PM This report has been signed electronically. Number of Addenda: 0 Note Initiated On: 05/14/2016 12:42 PM Scope Withdrawal Time: 0 hours 17 minutes 12 seconds  Total Procedure Duration: 1 hour 17 minutes 41 seconds       [facility]

## 2016-05-14 NOTE — Anesthesia Preprocedure Evaluation (Signed)
Anesthesia Evaluation  Patient identified by MRN, date of birth, ID band Patient awake    Reviewed: Allergy & Precautions, H&P , NPO status , Patient's Chart, lab work & pertinent test results, reviewed documented beta blocker date and time   Airway Mallampati: II   Neck ROM: full    Dental  (+) Poor Dentition   Pulmonary neg pulmonary ROS,    Pulmonary exam normal        Cardiovascular hypertension, negative cardio ROS Normal cardiovascular exam Rhythm:regular Rate:Normal     Neuro/Psych PSYCHIATRIC DISORDERS  Neuromuscular disease negative neurological ROS  negative psych ROS   GI/Hepatic negative GI ROS, Neg liver ROS,   Endo/Other  negative endocrine ROSdiabetesMorbid obesity  Renal/GU negative Renal ROS  negative genitourinary   Musculoskeletal   Abdominal   Peds  Hematology negative hematology ROS (+)   Anesthesia Other Findings Past Medical History: No date: Depression No date: Diabetes ([place]) No date: Hyperlipidemia No date: Hypertension No date: Neuropathy due to type 2 diabetes mellitus ([facility]* 11/15/2009: Obesity, morbid ([place]) No date: Sleep apnea     Comment: cpap No date: Swelling Past Surgical History: No date: ABDOMINAL HYSTERECTOMY No date: COLONOSCOPY No date: FOOT SURGERY Right     Comment: X2 No date: SHOULDER SURGERY Right No date: WRIST SURGERY Bilateral BMI    Body Mass Index:  44.40 kg/m     Reproductive/Obstetrics negative OB ROS                             Anesthesia Physical Anesthesia Plan  ASA: III  Anesthesia Plan: General   Post-op Pain Management:    Induction:   Airway Management Planned:   Additional Equipment:   Intra-op Plan:   Post-operative Plan:   Informed Consent: I have reviewed the patients History and Physical, chart, labs and discussed the procedure including the risks, benefits and alternatives for the proposed anesthesia  with the patient or authorized representative who has indicated his/her understanding and acceptance.   Dental Advisory Given  Plan Discussed with: CRNA  Anesthesia Plan Comments:         Anesthesia Quick Evaluation

## 2016-05-14 NOTE — Transfer of Care (Signed)
Immediate Anesthesia Transfer of Care Note  Patient: Tina Short  Procedure(s) Performed: Procedure(s): COLONOSCOPY WITH PROPOFOL (N/A)  Patient Location: PACU  Anesthesia Type:General  Level of Consciousness: awake, alert  and oriented  Airway & Oxygen Therapy: Patient Spontanous Breathing and Patient connected to nasal cannula oxygen  Post-op Assessment: Report given to RN and Post -op Vital signs reviewed and stable  Post vital signs: Reviewed and stable  Last Vitals:  Vitals:   05/14/16 1059 05/14/16 1410  BP: 139/65 131/74  Pulse: 84 93  Resp: 20 (!) 24  Temp: 37.6 C 36.6 C    Last Pain:  Vitals:   05/14/16 1410  TempSrc: Temporal         Complications: No apparent anesthesia complications

## 2016-05-14 NOTE — Anesthesia Postprocedure Evaluation (Signed)
Anesthesia Post Note  Patient: Tina Short  Procedure(s) Performed: Procedure(s) (LRB): COLONOSCOPY WITH PROPOFOL (N/A)  Patient location during evaluation: PACU Anesthesia Type: General Level of consciousness: awake and alert and oriented Pain management: pain level controlled Vital Signs Assessment: post-procedure vital signs reviewed and stable Respiratory status: spontaneous breathing Cardiovascular status: blood pressure returned to baseline Anesthetic complications: no     Last Vitals:  Vitals:   05/14/16 1059 05/14/16 1410  BP: 139/65 131/74  Pulse: 84 93  Resp: 20 (!) 24  Temp: 37.6 C 36.6 C    Last Pain:  Vitals:   05/14/16 1410  TempSrc: Temporal                 [place] Sorn

## 2016-05-14 NOTE — H&P (Signed)
Tina Short [id number] [birth year]     HPI: 61 y/o woman for screening colonoscopy. Reports tolerating the prep well.   Prescriptions Prior to Admission  Medication Sig Dispense Refill Last Dose  . allopurinol (ZYLOPRIM) 100 MG tablet TAKE 1 TABLET BY MOUTH TWO  TIMES DAILY 180 tablet 1 05/13/2016 at Unknown time  . aspirin EC 81 MG tablet Take 81 mg by mouth daily.   Past Week at Unknown time  . atorvastatin (LIPITOR) 40 MG tablet Take 1 tablet (40 mg total) by mouth at bedtime. 90 tablet 1 05/13/2016 at Unknown time  . Dulaglutide (TRULICITY) 1.5 WE/3.1VQ SOPN Inject 1.5 mg into the skin once a week. 12 pen 1 Past Week at Unknown time  . furosemide (LASIX) 40 MG tablet TAKE 1 TABLET BY MOUTH TWO  TIMES DAILY AS NEEDED 180 tablet 0 05/13/2016 at Unknown time  . losartan (COZAAR) 100 MG tablet Take 1 tablet (100 mg total) by mouth daily. 90 tablet 1 05/13/2016 at Unknown time  . metformin (FORTAMET) 1000 MG (OSM) 24 hr tablet TAKE 2 TABLETS BY MOUTH  DAILY WITH BREAKFAST 180 tablet 1 05/13/2016 at Unknown time  . Multiple Vitamin (MULTIVITAMIN) capsule Take 1 capsule by mouth daily.   Past Week at Unknown time  . omega-3 acid ethyl esters (LOVAZA) 1 g capsule TAKE 2 CAPSULES BY MOUTH  TWO TIMES DAILY 360 capsule 1 05/13/2016 at Unknown time  . potassium chloride SA (K-DUR,KLOR-CON) 20 MEQ tablet Take 1 tablet by mouth  every day 90 tablet 0 Past Month at Unknown time  . venlafaxine XR (EFFEXOR-XR) 150 MG 24 hr capsule Take 2 capsules (300 mg total) by mouth daily with breakfast. 180 capsule 1 05/13/2016 at Unknown time  . ACCU-CHEK AVIVA PLUS test strip Test once daily 100 each 11 Taking  . Vitamin D, Ergocalciferol, (DRISDOL) 50000 units CAPS capsule TAKE 1 CAPSULE BY MOUTH  EVERY 7 DAYS 12 capsule 1 Taking   Allergies  Allergen Reactions  . Abilify  [Aripiprazole]     Other reaction(s): Difficulty breathing  . Invokana [Canagliflozin] Itching    Yeast infections   Past Medical History:   Diagnosis Date  . Depression   . Diabetes ([place])   . Hyperlipidemia   . Hypertension   . Neuropathy due to type 2 diabetes mellitus ([place])   . Obesity, morbid ([place]) 11/15/2009  . Sleep apnea    cpap  . Swelling    Past Surgical History:  Procedure Laterality Date  . ABDOMINAL HYSTERECTOMY    . COLONOSCOPY    . FOOT SURGERY Right    X2  . SHOULDER SURGERY Right   . WRIST SURGERY Bilateral    Social History   Social History  . Marital status: Married    Spouse name: N/A  . Number of children: N/A  . Years of education: N/A   Occupational History  . Not on file.   Social History Main Topics  . Smoking status: Never Smoker  . Smokeless tobacco: Never Used  . Alcohol use No  . Drug use: No  . Sexual activity: Yes    Partners: Male   Other Topics Concern  . Not on file   Social History Narrative  . No narrative on file   Social History   Social History Narrative  . No narrative on file     ROS: Negative.     PE: HEENT: Negative. Lungs: Clear. Cardio: RR.  Assessment/Plan:  Proceed with planned endoscopy.   Sarthak Rubenstein,  Forest Gleason 05/14/2016   Assessment/Plan:  Proceed with planned endoscopy.

## 2016-05-15 ENCOUNTER — Encounter: Payer: Self-pay | Admitting: General Surgery

## 2016-05-16 LAB — SURGICAL PATHOLOGY

## 2016-05-20 ENCOUNTER — Ambulatory Visit: Payer: 59 | Admitting: Family Medicine

## 2016-05-23 ENCOUNTER — Ambulatory Visit (INDEPENDENT_AMBULATORY_CARE_PROVIDER_SITE_OTHER): Payer: 59 | Admitting: Family Medicine

## 2016-05-23 ENCOUNTER — Encounter: Payer: Self-pay | Admitting: Family Medicine

## 2016-05-23 VITALS — BP 136/80 | HR 92 | Temp 97.8°F | Resp 17 | Ht 68.0 in | Wt 292.8 lb

## 2016-05-23 DIAGNOSIS — G4733 Obstructive sleep apnea (adult) (pediatric): Secondary | ICD-10-CM

## 2016-05-23 DIAGNOSIS — I10 Essential (primary) hypertension: Secondary | ICD-10-CM | POA: Diagnosis not present

## 2016-05-23 DIAGNOSIS — E1140 Type 2 diabetes mellitus with diabetic neuropathy, unspecified: Secondary | ICD-10-CM | POA: Diagnosis not present

## 2016-05-23 DIAGNOSIS — E1165 Type 2 diabetes mellitus with hyperglycemia: Secondary | ICD-10-CM | POA: Diagnosis not present

## 2016-05-23 DIAGNOSIS — E782 Mixed hyperlipidemia: Secondary | ICD-10-CM

## 2016-05-23 DIAGNOSIS — E538 Deficiency of other specified B group vitamins: Secondary | ICD-10-CM

## 2016-05-23 DIAGNOSIS — IMO0002 Reserved for concepts with insufficient information to code with codable children: Secondary | ICD-10-CM

## 2016-05-23 DIAGNOSIS — F339 Major depressive disorder, recurrent, unspecified: Secondary | ICD-10-CM

## 2016-05-23 DIAGNOSIS — R600 Localized edema: Secondary | ICD-10-CM

## 2016-05-23 LAB — POCT GLYCOSYLATED HEMOGLOBIN (HGB A1C): HEMOGLOBIN A1C: 7.1

## 2016-05-23 LAB — GLUCOSE, POCT (MANUAL RESULT ENTRY): POC GLUCOSE: 160 mg/dL — AB (ref 70–99)

## 2016-05-23 MED ORDER — CYANOCOBALAMIN 1000 MCG/ML IJ SOLN
1000.0000 ug | Freq: Once | INTRAMUSCULAR | Status: AC
  Administered 2016-05-23: 1000 ug via INTRAMUSCULAR

## 2016-05-23 MED ORDER — POTASSIUM CHLORIDE CRYS ER 20 MEQ PO TBCR
20.0000 meq | EXTENDED_RELEASE_TABLET | Freq: Every day | ORAL | 0 refills | Status: DC
Start: 2016-05-23 — End: 2016-07-11

## 2016-05-23 NOTE — Progress Notes (Signed)
Name: Tina Short   MRN: [id number]    DOB: 21-Jan-1956   Date:05/23/2016       Progress Note  Subjective  Chief Complaint  Chief Complaint  Patient presents with  . Follow-up    3 mo  . Diabetes    HPI  DMII: she has a history of DM for many years, seen by Endocrinologist in the past. Last hgbA1C 6.5% but is up to 7.1%. She is currently on Trulicity and Fortamet ( since 10/2015 ) She lost 11 lbs initially, but weight has plateau. She denies polyphagia,  polydipsia and polyuria. She takes statin, Losartan ( ARB) taking aspirin sometimes. Doing better with food choices, she states Trulicity curbs her appetite. She has neuropathy, but is doing better since glucose is at goal. She has diarrhea since started on Trulicity, but she does not want to change medication at this time.  Morbid obesity: she has a long history of obesity, she was started on Trulicity in 56/3893 and is responding well, no longer over eating and lost 11 lbs initially.  Major Depression: Her father committed suicide in his 51's and there are other family members with history of psychiatric illness. She denies suicidal thoughts, but would be fine if she died. She states that she is doing her part in trying to do things even when she does not feel like it. She has not missed any work. She has been shopping a little online, but not getting out of the house for pleasure. We increased Effexor to 300 mg daily in September but she has not noticed much of a change. Symptoms worse during Winter months. She states her boss knows she has depression. She states she gets distracted when at work, but feels down and tired when she gets home. She feels guilty because the best part of her is at work.  Discussed referral to psychiatrist but she refuses. Discussed sun exposure, try exercise a little, mindfulness   OSA: she uses CPAP every night. She had a repeat study, compliant with medication  B12 deficiency: she has been getting B12,  but not monthly   Gout: no recent episodes, she is now on Allopurinol   Patient Active Problem List   Diagnosis Date Noted  . Encounter for screening colonoscopy 04/30/2016  . Major depression, recurrent, chronic ([place]) 10/30/2015  . Dyslipidemia associated with type 2 diabetes mellitus ([place]) 10/30/2015  . Vitamin D deficiency 07/27/2015  . Vitamin B12 deficiency 07/27/2015  . History of anemia 09/12/2014  . Carpal tunnel syndrome 09/12/2014  . Obstructive sleep apnea 09/12/2014  . Edema extremities 09/12/2014  . Gout 09/12/2014  . Extreme obesity ([place]) 11/15/2009  . Benign essential HTN 10/07/2006  . Type 2 diabetes mellitus, uncontrolled, with neuropathy ([place]) 10/07/2006  . Hyperlipidemia 10/07/2006    Past Surgical History:  Procedure Laterality Date  . ABDOMINAL HYSTERECTOMY    . COLONOSCOPY    . COLONOSCOPY WITH PROPOFOL N/A 05/14/2016   Procedure: COLONOSCOPY WITH PROPOFOL;  Surgeon: [name], MD;  Location: [facility] ENDOSCOPY;  Service: Endoscopy;  Laterality: N/A;  . FOOT SURGERY Right    X2  . SHOULDER SURGERY Right   . WRIST SURGERY Bilateral     Family History  Problem Relation Age of Onset  . Diabetes Mother   . Cancer Mother     uterine  . Mental illness Father   . Obesity Daughter   . Diabetes Maternal Grandmother   . Heart disease Brother   . Obesity Daughter  Social History   Social History  . Marital status: Married    Spouse name: N/A  . Number of children: N/A  . Years of education: N/A   Occupational History  . Not on file.   Social History Main Topics  . Smoking status: Never Smoker  . Smokeless tobacco: Never Used  . Alcohol use No  . Drug use: No  . Sexual activity: Yes    Partners: Male   Other Topics Concern  . Not on file   Social History Narrative  . No narrative on file     Current Outpatient Prescriptions:  .  ACCU-CHEK AVIVA PLUS test strip, Test once daily, Disp: 100 each, Rfl: 11 .  allopurinol (ZYLOPRIM)  100 MG tablet, TAKE 1 TABLET BY MOUTH TWO  TIMES DAILY, Disp: 180 tablet, Rfl: 1 .  aspirin EC 81 MG tablet, Take 81 mg by mouth daily., Disp: , Rfl:  .  atorvastatin (LIPITOR) 40 MG tablet, Take 1 tablet (40 mg total) by mouth at bedtime., Disp: 90 tablet, Rfl: 1 .  Dulaglutide (TRULICITY) 1.5 TI/4.5YK SOPN, Inject 1.5 mg into the skin once a week., Disp: 12 pen, Rfl: 1 .  furosemide (LASIX) 40 MG tablet, TAKE 1 TABLET BY MOUTH TWO  TIMES DAILY AS NEEDED, Disp: 180 tablet, Rfl: 0 .  losartan (COZAAR) 100 MG tablet, Take 1 tablet (100 mg total) by mouth daily., Disp: 90 tablet, Rfl: 1 .  metformin (FORTAMET) 1000 MG (OSM) 24 hr tablet, TAKE 2 TABLETS BY MOUTH  DAILY WITH BREAKFAST, Disp: 180 tablet, Rfl: 1 .  Multiple Vitamin (MULTIVITAMIN) capsule, Take 1 capsule by mouth daily., Disp: , Rfl:  .  omega-3 acid ethyl esters (LOVAZA) 1 g capsule, TAKE 2 CAPSULES BY MOUTH  TWO TIMES DAILY, Disp: 360 capsule, Rfl: 1 .  potassium chloride SA (K-DUR,KLOR-CON) 20 MEQ tablet, Take 1 tablet by mouth  every day, Disp: 90 tablet, Rfl: 0 .  venlafaxine XR (EFFEXOR-XR) 150 MG 24 hr capsule, Take 2 capsules (300 mg total) by mouth daily with breakfast., Disp: 180 capsule, Rfl: 1 .  Vitamin D, Ergocalciferol, (DRISDOL) 50000 units CAPS capsule, TAKE 1 CAPSULE BY MOUTH  EVERY 7 DAYS, Disp: 12 capsule, Rfl: 1  Allergies  Allergen Reactions  . Abilify  [Aripiprazole]     Other reaction(s): Difficulty breathing  . Invokana [Canagliflozin] Itching    Yeast infections     ROS  Constitutional: Negative for fever or weight change.  Respiratory: Negative for cough and shortness of breath.   Cardiovascular: Negative for chest pain or palpitations.  Gastrointestinal: Negative for abdominal pain, no bowel changes.  Musculoskeletal: Negative for gait problem or joint swelling.  Skin: Negative for rash.  Neurological: Negative for dizziness or headache.  No other specific complaints in a complete review of systems  (except as listed in HPI above).  Objective  Vitals:   05/23/16 0756  BP: 136/80  Pulse: 92  Resp: 17  Temp: 97.8 F (36.6 C)  TempSrc: Oral  SpO2: 95%  Weight: 292 lb 12.8 oz (132.8 kg)  Height: 5\' 8"  (1.727 m)    Body mass index is 44.52 kg/m.  Physical Exam  Constitutional: Patient appears well-developed Obese  No distress.  HEENT: head atraumatic, normocephalic, pupils equal and reactive to light,  neck supple, throat within normal limits Cardiovascular: Normal rate, regular rhythm and normal heart sounds.  No murmur heard.Trace  BLE edema. Pulmonary/Chest: Effort normal and breath sounds normal. No respiratory distress. Abdominal: Soft.  There is no tenderness. Psychiatric: Patient has a normal mood and affect. behavior is normal. Judgment and thought content normal.  Recent Results (from the past 2160 hour(s))  Glucose, capillary     Status: Abnormal   Collection Time: 05/14/16 11:37 AM  Result Value Ref Range   Glucose-Capillary 157 (H) 65 - 99 mg/dL  Surgical pathology     Status: None   Collection Time: 05/14/16  2:01 PM  Result Value Ref Range   SURGICAL PATHOLOGY      Surgical Pathology CASE: [id number] PATIENT: Sudie Bailey Surgical Pathology Report     SPECIMEN SUBMITTED: A. Colon polyp, rectosigmoid, cbx  CLINICAL HISTORY: None provided  PRE-OPERATIVE DIAGNOSIS: Screening  POST-OPERATIVE DIAGNOSIS: None provided.     DIAGNOSIS: A. COLON POLYP, RECTOSIGMOID; COLD BIOPSY: - HYPERPLASTIC POLYP, 2 FRAGMENTS. - NEGATIVE FOR DYSPLASIA AND MALIGNANCY.   GROSS DESCRIPTION: A. Labeled: C BX polyp rectosigmoid Tissue fragment(s): 2 Size: 0.4 and 0.5 cm Description: tan to brown fragments  Entirely submitted in 1 cassette(s).  Final Diagnosis performed by Bryan Lemma, MD.  Electronically signed 05/16/2016 6:34:35PM    The electronic signature indicates that the named Attending Pathologist has evaluated the specimen  Technical  component performed at [facility], [address] [place], [place] [address] Lab: [phone] Dir: Darrick Penna. Evette Doffing, MD  Professional component performed at [facility], [facility], [place], [place], [place] [address] Lab: [phone] Dir: Dellia Nims. Rubinas, MD    POCT Glucose (CBG)     Status: Abnormal   Collection Time: 05/23/16  8:00 AM  Result Value Ref Range   POC Glucose 160 (A) 70 - 99 mg/dl  POCT HgB A1C     Status: Abnormal   Collection Time: 05/23/16  8:01 AM  Result Value Ref Range   Hemoglobin A1C 7.1      PHQ2/9: Depression screen [facility] 2/9 05/23/2016 01/23/2016 11/23/2015 10/30/2015 12/27/2014  Decreased Interest 0 0 1 3 0  Down, Depressed, Hopeless 0 0 1 3 0  PHQ - 2 Score 0 0 2 6 0  Altered sleeping - - 1 0 -  Tired, decreased energy - - 1 1 -  Change in appetite - - 0 0 -  Feeling bad or failure about yourself  - - 0 3 -  Trouble concentrating - - 0 1 -  Moving slowly or fidgety/restless - - 0 0 -  Suicidal thoughts - - 0 0 -  PHQ-9 Score - - 4 11 -  Difficult doing work/chores - - Somewhat difficult Not difficult at all -     Fall Risk: Fall Risk  05/23/2016 01/23/2016 11/23/2015 10/30/2015 12/27/2014  Falls in the past year? No No No No No     Functional Status Survey: Is the patient deaf or have difficulty hearing?: No Does the patient have difficulty seeing, even when wearing glasses/contacts?: Yes (glasses) Does the patient have difficulty concentrating, remembering, or making decisions?: No Does the patient have difficulty walking or climbing stairs?: No Does the patient have difficulty dressing or bathing?: No Does the patient have difficulty doing errands alone such as visiting a doctor's office or shopping?: No    Assessment & Plan   1. Type 2 diabetes mellitus, uncontrolled, with neuropathy (HCC)  - POCT HgB A1C - POCT Glucose (CBG)  2. Morbid obesity, unspecified obesity type ([place])  Weight is unchanged, lost  weight after started on trulicity in September, but reached a plateau   3. Benign essential HTN  bp is  at goal, continue medication  4. Obstructive sleep apnea  Continue CPAP every night  5. Mixed dyslipidemia  Continue statin therapy  6. Edema extremities  Discussed compression stocking hoses, losing weight and elevating extremities - potassium chloride SA (K-DUR,KLOR-CON) 20 MEQ tablet; Take 1 tablet (20 mEq total) by mouth daily.  Dispense: 90 tablet; Refill: 0  7. Major depression, recurrent, chronic (HCC)  Not doing well, on Effexor . Seen therapist and psychiatrist in the past. Discussed CBT but she is resistant.   8. Vitamin B12 deficiency  -B12 today

## 2016-05-23 NOTE — Patient Instructions (Signed)
The greatest Courses:     Cognitive Behavioral Therapy  Mindfulness

## 2016-05-23 NOTE — Addendum Note (Signed)
Addended by: Lolita Rieger D on: 05/23/2016 08:52 AM   Modules accepted: Orders

## 2016-05-27 ENCOUNTER — Ambulatory Visit
Admission: RE | Admit: 2016-05-27 | Discharge: 2016-05-27 | Disposition: A | Discharge status: Home / Self Care | Payer: 59 | Source: Ambulatory Visit | Attending: Family Medicine | Admitting: Family Medicine

## 2016-05-27 DIAGNOSIS — Z1239 Encounter for other screening for malignant neoplasm of breast: Secondary | ICD-10-CM

## 2016-05-27 DIAGNOSIS — Z1231 Encounter for screening mammogram for malignant neoplasm of breast: Secondary | ICD-10-CM | POA: Diagnosis not present

## 2016-05-28 ENCOUNTER — Telehealth: Payer: Self-pay | Admitting: *Deleted

## 2016-05-28 DIAGNOSIS — Z1211 Encounter for screening for malignant neoplasm of colon: Secondary | ICD-10-CM

## 2016-05-28 DIAGNOSIS — Z1212 Encounter for screening for malignant neoplasm of rectum: Principal | ICD-10-CM

## 2016-05-28 NOTE — Telephone Encounter (Signed)
-----   Message from [name], MD sent at 05/28/2016  6:22 AM EDT ----- Patient had an incomplete colonoscopy. Discussed options for f/u with her 10 days ago: Air contrast BE,  Cologuard vs repeat attempt at colonoscopy. See what she is planning to do.  Thanks.

## 2016-05-28 NOTE — Telephone Encounter (Signed)
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  IFOBT test ordered, order mailed to pt and she is aware to pick up testing package from [place], pt aware

## 2016-06-20 ENCOUNTER — Other Ambulatory Visit: Payer: Self-pay

## 2016-06-20 DIAGNOSIS — E559 Vitamin D deficiency, unspecified: Secondary | ICD-10-CM

## 2016-06-20 MED ORDER — VITAMIN D (ERGOCALCIFEROL) 1.25 MG (50000 UNIT) PO CAPS: ORAL_CAPSULE | ORAL | 1 refills | Status: DC

## 2016-06-20 NOTE — Telephone Encounter (Signed)
Patient requesting refill of Vitamin D to [facility].

## 2016-07-03 ENCOUNTER — Ambulatory Visit
Admission: RE | Admit: 2016-07-03 | Discharge: 2016-07-03 | Disposition: A | Discharge status: Home / Self Care | Payer: 59 | Source: Ambulatory Visit | Attending: Family Medicine | Admitting: Family Medicine

## 2016-07-03 ENCOUNTER — Ambulatory Visit (INDEPENDENT_AMBULATORY_CARE_PROVIDER_SITE_OTHER): Payer: 59 | Admitting: Family Medicine

## 2016-07-03 ENCOUNTER — Other Ambulatory Visit: Payer: Self-pay | Admitting: Family Medicine

## 2016-07-03 VITALS — BP 140/78 | HR 84 | Temp 97.6°F | Resp 16 | Ht 68.0 in | Wt 288.0 lb

## 2016-07-03 DIAGNOSIS — R079 Chest pain, unspecified: Secondary | ICD-10-CM | POA: Diagnosis not present

## 2016-07-03 DIAGNOSIS — R05 Cough: Secondary | ICD-10-CM

## 2016-07-03 DIAGNOSIS — R112 Nausea with vomiting, unspecified: Secondary | ICD-10-CM

## 2016-07-03 DIAGNOSIS — R42 Dizziness and giddiness: Secondary | ICD-10-CM

## 2016-07-03 DIAGNOSIS — R059 Cough, unspecified: Secondary | ICD-10-CM

## 2016-07-03 DIAGNOSIS — I517 Cardiomegaly: Secondary | ICD-10-CM | POA: Insufficient documentation

## 2016-07-03 MED ORDER — MECLIZINE HCL 25 MG PO TABS: 25.0000 mg | ORAL_TABLET | Freq: Three times a day (TID) | ORAL | 0 refills | Status: DC | PRN

## 2016-07-03 MED ORDER — PROMETHAZINE HCL 25 MG PO TABS: 25.0000 mg | ORAL_TABLET | Freq: Three times a day (TID) | ORAL | 0 refills | Status: DC | PRN

## 2016-07-03 NOTE — Progress Notes (Signed)
Name: Tina Short   MRN: [id number]    DOB: 07/05/1955   Date:07/03/2016       Progress Note  Subjective  Chief Complaint  Chief Complaint  Patient presents with  . Nausea  . Cough  . Emesis    pt stated that everytime she coughs she has to vomit started yesterday    Emesis   This is a new problem. The current episode started in the past 7 days (2 days ago). The problem has been unchanged. There has been no fever. Associated symptoms include chest pain (episode of chest pain described as 'indigestion tpe' 2-3 days ago, now resolved. ), coughing (prolonged cough for over a month) and dizziness (buzzing in the ears, dizzy when she tries to bend down or change positions.). Pertinent negatives include no fever, headaches or sweats.     Past Medical History:  Diagnosis Date  . Depression   . Diabetes ([place])   . Hyperlipidemia   . Hypertension   . Neuropathy due to type 2 diabetes mellitus ([place])   . Obesity, morbid ([place]) 11/15/2009  . Sleep apnea    cpap  . Swelling     Past Surgical History:  Procedure Laterality Date  . ABDOMINAL HYSTERECTOMY    . COLONOSCOPY    . COLONOSCOPY WITH PROPOFOL N/A 05/14/2016   Procedure: COLONOSCOPY WITH PROPOFOL;  Surgeon: [name], MD;  Location: [facility] ENDOSCOPY;  Service: Endoscopy;  Laterality: N/A;  . FOOT SURGERY Right    X2  . SHOULDER SURGERY Right   . WRIST SURGERY Bilateral     Family History  Problem Relation Age of Onset  . Diabetes Mother   . Cancer Mother        uterine  . Mental illness Father   . Obesity Daughter   . Diabetes Maternal Grandmother   . Heart disease Brother   . Obesity Daughter     Social History   Social History  . Marital status: Married    Spouse name: N/A  . Number of children: N/A  . Years of education: N/A   Occupational History  . Not on file.   Social History Main Topics  . Smoking status: Never Smoker  . Smokeless tobacco: Never Used  . Alcohol use No  . Drug use: No  .  Sexual activity: Yes    Partners: Male   Other Topics Concern  . Not on file   Social History Narrative  . No narrative on file     Current Outpatient Prescriptions:  .  ACCU-CHEK AVIVA PLUS test strip, Test once daily, Disp: 100 each, Rfl: 11 .  allopurinol (ZYLOPRIM) 100 MG tablet, TAKE 1 TABLET BY MOUTH TWO  TIMES DAILY, Disp: 180 tablet, Rfl: 1 .  aspirin EC 81 MG tablet, Take 81 mg by mouth daily., Disp: , Rfl:  .  atorvastatin (LIPITOR) 40 MG tablet, Take 1 tablet (40 mg total) by mouth at bedtime., Disp: 90 tablet, Rfl: 1 .  Dulaglutide (TRULICITY) 1.5 QA/8.3MH SOPN, Inject 1.5 mg into the skin once a week., Disp: 12 pen, Rfl: 1 .  furosemide (LASIX) 40 MG tablet, TAKE 1 TABLET BY MOUTH TWO  TIMES DAILY AS NEEDED, Disp: 180 tablet, Rfl: 0 .  losartan (COZAAR) 100 MG tablet, Take 1 tablet (100 mg total) by mouth daily., Disp: 90 tablet, Rfl: 1 .  metformin (FORTAMET) 1000 MG (OSM) 24 hr tablet, TAKE 2 TABLETS BY MOUTH  DAILY WITH BREAKFAST, Disp: 180 tablet, Rfl: 1 .  Multiple Vitamin (MULTIVITAMIN) capsule, Take 1 capsule by mouth daily., Disp: , Rfl:  .  omega-3 acid ethyl esters (LOVAZA) 1 g capsule, TAKE 2 CAPSULES BY MOUTH  TWO TIMES DAILY, Disp: 360 capsule, Rfl: 1 .  potassium chloride SA (K-DUR,KLOR-CON) 20 MEQ tablet, Take 1 tablet (20 mEq total) by mouth daily., Disp: 90 tablet, Rfl: 0 .  venlafaxine XR (EFFEXOR-XR) 150 MG 24 hr capsule, Take 2 capsules (300 mg total) by mouth daily with breakfast., Disp: 180 capsule, Rfl: 1 .  Vitamin D, Ergocalciferol, (DRISDOL) 50000 units CAPS capsule, TAKE 1 CAPSULE BY MOUTH  EVERY 7 DAYS, Disp: 12 capsule, Rfl: 1  Allergies  Allergen Reactions  . Abilify  [Aripiprazole]     Other reaction(s): Difficulty breathing  . Invokana [Canagliflozin] Itching    Yeast infections     Review of Systems  Constitutional: Negative for fever.  Respiratory: Positive for cough (prolonged cough for over a month).   Cardiovascular: Positive for  chest pain (episode of chest pain described as 'indigestion tpe' 2-3 days ago, now resolved. ).  Gastrointestinal: Positive for vomiting.  Neurological: Positive for dizziness (buzzing in the ears, dizzy when she tries to bend down or change positions.). Negative for headaches.     Objective  Vitals:   07/03/16 1145  BP: 140/78  Pulse: 84  Resp: 16  Temp: 97.6 F (36.4 C)  SpO2: 95%  Weight: 288 lb (130.6 kg)  Height: 5\' 8"  (1.727 m)    Physical Exam  Constitutional: She is oriented to person, place, and time and well-developed, well-nourished, and in no distress.  HENT:  Head: Normocephalic and atraumatic.  Right Ear: External ear normal.  Left Ear: External ear normal.  Nose: Nose normal.  Mouth/Throat: Oropharynx is clear and moist. No oropharyngeal exudate.  Eyes: Pupils are equal, round, and reactive to light.  Cardiovascular: Normal rate, regular rhythm and normal heart sounds.   No murmur heard. Pulmonary/Chest: Effort normal and breath sounds normal. She has no wheezes.  Abdominal: Soft. Bowel sounds are normal. There is no tenderness.  Neurological: She is alert and oriented to person, place, and time. She has normal strength and intact cranial nerves.  Psychiatric: Mood, memory and judgment normal. She has a flat affect.  Nursing note and vitals reviewed.    Assessment & Plan  1. Dizziness Unclear etiology, could be related to the increased stress versus vertigo versus dehydration. Obtain lab work, start on meclizine for treatment - CBC with Differential/Platelet - COMPLETE METABOLIC PANEL WITH GFR - meclizine (ANTIVERT) 25 MG tablet; Take 1 tablet (25 mg total) by mouth 3 (three) times daily as needed for dizziness.  Dispense: 30 tablet; Refill: 0  2. Chest pain, unspecified type EKG is normal sinus rhythm, obtain chest x-ray for complete workup - DG Chest 2 View; Future - EKG 12-Lead  3. Cough  - DG Chest 2 View; Future  4. Non-intractable vomiting  with nausea, unspecified vomiting type  - CBC with Differential/Platelet - COMPLETE METABOLIC PANEL WITH GFR - promethazine (PHENERGAN) 25 MG tablet; Take 1 tablet (25 mg total) by mouth every 8 (eight) hours as needed for nausea or vomiting.  Dispense: 30 tablet; Refill: 0 - meclizine (ANTIVERT) 25 MG tablet; Take 1 tablet (25 mg total) by mouth 3 (three) times daily as needed for dizziness.  Dispense: 30 tablet; Refill: 0  Babacar Haycraft [facility] 07/03/2016 11:49 AM

## 2016-07-04 LAB — CBC WITH DIFFERENTIAL/PLATELET
BASOS ABS: 0 10*3/uL (ref 0.0–0.2)
Basos: 0 %
EOS (ABSOLUTE): 0.1 10*3/uL (ref 0.0–0.4)
EOS: 1 %
HEMOGLOBIN: 15.3 g/dL (ref 11.1–15.9)
Hematocrit: 45.9 % (ref 34.0–46.6)
IMMATURE GRANULOCYTES: 0 %
Immature Grans (Abs): 0 10*3/uL (ref 0.0–0.1)
LYMPHS: 30 %
Lymphocytes Absolute: 2.3 10*3/uL (ref 0.7–3.1)
MCH: 31.7 pg (ref 26.6–33.0)
MCHC: 33.3 g/dL (ref 31.5–35.7)
MCV: 95 fL (ref 79–97)
MONOCYTES: 6 %
Monocytes Absolute: 0.5 10*3/uL (ref 0.1–0.9)
NEUTROS PCT: 63 %
Neutrophils Absolute: 4.7 10*3/uL (ref 1.4–7.0)
Platelets: 217 10*3/uL (ref 150–379)
RBC: 4.82 x10E6/uL (ref 3.77–5.28)
RDW: 14 % (ref 12.3–15.4)
WBC: 7.6 10*3/uL (ref 3.4–10.8)

## 2016-07-04 LAB — COMPREHENSIVE METABOLIC PANEL
ALT: 48 IU/L — ABNORMAL HIGH (ref 0–32)
AST: 55 IU/L — AB (ref 0–40)
Albumin/Globulin Ratio: 1.5 (ref 1.2–2.2)
Albumin: 4.5 g/dL (ref 3.6–4.8)
Alkaline Phosphatase: 66 IU/L (ref 39–117)
BUN/Creatinine Ratio: 18 (ref 12–28)
BUN: 10 mg/dL (ref 8–27)
Bilirubin Total: 0.7 mg/dL (ref 0.0–1.2)
CALCIUM: 10.2 mg/dL (ref 8.7–10.3)
CO2: 25 mmol/L (ref 18–29)
CREATININE: 0.56 mg/dL — AB (ref 0.57–1.00)
Chloride: 101 mmol/L (ref 96–106)
GFR calc Af Amer: 116 mL/min/{1.73_m2} (ref >59)
GFR, EST NON AFRICAN AMERICAN: 100 mL/min/{1.73_m2} (ref >59)
GLOBULIN, TOTAL: 3.1 g/dL (ref 1.5–4.5)
GLUCOSE: 172 mg/dL — AB (ref 65–99)
Potassium: 4.5 mmol/L (ref 3.5–5.2)
SODIUM: 145 mmol/L — AB (ref 134–144)
Total Protein: 7.6 g/dL (ref 6.0–8.5)

## 2016-07-08 ENCOUNTER — Other Ambulatory Visit: Payer: Self-pay | Admitting: Family Medicine

## 2016-07-08 DIAGNOSIS — I10 Essential (primary) hypertension: Secondary | ICD-10-CM

## 2016-07-08 DIAGNOSIS — F339 Major depressive disorder, recurrent, unspecified: Secondary | ICD-10-CM

## 2016-07-08 DIAGNOSIS — E782 Mixed hyperlipidemia: Secondary | ICD-10-CM

## 2016-07-09 ENCOUNTER — Encounter: Payer: Self-pay | Admitting: Family Medicine

## 2016-07-09 NOTE — Telephone Encounter (Signed)
Please let patient know I have only refilled her Effexor to get her to her July appointment with Dr. Ancil Boozer because Dr. Ancil Boozer may want to talk with her about a dosage/medication change at that time depending on how she is doing on the medication. If she has any issues, please let me know. Thank you!

## 2016-07-09 NOTE — Telephone Encounter (Signed)
Patient requesting refill of Atorvastatin, Losartan and Venlafaxine to [facility].

## 2016-07-11 ENCOUNTER — Other Ambulatory Visit: Payer: Self-pay | Admitting: Family Medicine

## 2016-07-11 DIAGNOSIS — R600 Localized edema: Secondary | ICD-10-CM

## 2016-07-11 MED ORDER — POTASSIUM CHLORIDE CRYS ER 20 MEQ PO TBCR: 20.0000 meq | EXTENDED_RELEASE_TABLET | Freq: Every day | ORAL | 1 refills | Status: DC

## 2016-07-11 NOTE — Telephone Encounter (Signed)
Please call patient - she was given a 90-day supply of Potassium supplement on 05/23/2016, so it is too soon to refill.  If she has any questions, please let me know. Thank you!

## 2016-07-11 NOTE — Telephone Encounter (Signed)
Refill provided. Thank you

## 2016-07-11 NOTE — Telephone Encounter (Signed)
Want refill on file

## 2016-08-06 ENCOUNTER — Other Ambulatory Visit: Payer: Self-pay | Admitting: Family Medicine

## 2016-08-06 DIAGNOSIS — F339 Major depressive disorder, recurrent, unspecified: Secondary | ICD-10-CM

## 2016-08-10 ENCOUNTER — Emergency Department: Payer: 59

## 2016-08-10 ENCOUNTER — Inpatient Hospital Stay
Admission: EM | Admit: 2016-08-10 | Discharge: 2016-08-15 | DRG: 872 | Disposition: A | Discharge status: Home / Self Care | Payer: 59 | Attending: Internal Medicine | Admitting: Internal Medicine

## 2016-08-10 DIAGNOSIS — Z6841 Body Mass Index (BMI) 40.0 and over, adult: Secondary | ICD-10-CM

## 2016-08-10 DIAGNOSIS — R600 Localized edema: Secondary | ICD-10-CM | POA: Diagnosis present

## 2016-08-10 DIAGNOSIS — F329 Major depressive disorder, single episode, unspecified: Secondary | ICD-10-CM | POA: Diagnosis present

## 2016-08-10 DIAGNOSIS — Z833 Family history of diabetes mellitus: Secondary | ICD-10-CM

## 2016-08-10 DIAGNOSIS — R6520 Severe sepsis without septic shock: Secondary | ICD-10-CM | POA: Diagnosis present

## 2016-08-10 DIAGNOSIS — M5441 Lumbago with sciatica, right side: Secondary | ICD-10-CM | POA: Diagnosis present

## 2016-08-10 DIAGNOSIS — E785 Hyperlipidemia, unspecified: Secondary | ICD-10-CM | POA: Diagnosis present

## 2016-08-10 DIAGNOSIS — R0602 Shortness of breath: Secondary | ICD-10-CM

## 2016-08-10 DIAGNOSIS — R112 Nausea with vomiting, unspecified: Secondary | ICD-10-CM | POA: Diagnosis present

## 2016-08-10 DIAGNOSIS — Z8249 Family history of ischemic heart disease and other diseases of the circulatory system: Secondary | ICD-10-CM

## 2016-08-10 DIAGNOSIS — Z79899 Other long term (current) drug therapy: Secondary | ICD-10-CM | POA: Diagnosis not present

## 2016-08-10 DIAGNOSIS — N390 Urinary tract infection, site not specified: Secondary | ICD-10-CM | POA: Diagnosis present

## 2016-08-10 DIAGNOSIS — Z7982 Long term (current) use of aspirin: Secondary | ICD-10-CM | POA: Diagnosis not present

## 2016-08-10 DIAGNOSIS — I10 Essential (primary) hypertension: Secondary | ICD-10-CM | POA: Diagnosis present

## 2016-08-10 DIAGNOSIS — Z7984 Long term (current) use of oral hypoglycemic drugs: Secondary | ICD-10-CM | POA: Diagnosis not present

## 2016-08-10 DIAGNOSIS — R9431 Abnormal electrocardiogram [ECG] [EKG]: Secondary | ICD-10-CM

## 2016-08-10 DIAGNOSIS — Z888 Allergy status to other drugs, medicaments and biological substances status: Secondary | ICD-10-CM

## 2016-08-10 DIAGNOSIS — A419 Sepsis, unspecified organism: Secondary | ICD-10-CM | POA: Diagnosis not present

## 2016-08-10 DIAGNOSIS — E1140 Type 2 diabetes mellitus with diabetic neuropathy, unspecified: Secondary | ICD-10-CM | POA: Diagnosis present

## 2016-08-10 DIAGNOSIS — M545 Low back pain: Secondary | ICD-10-CM

## 2016-08-10 DIAGNOSIS — N3000 Acute cystitis without hematuria: Secondary | ICD-10-CM

## 2016-08-10 DIAGNOSIS — R042 Hemoptysis: Secondary | ICD-10-CM

## 2016-08-10 LAB — URINALYSIS, COMPLETE (UACMP) WITH MICROSCOPIC
Bilirubin Urine: NEGATIVE
Glucose, UA: 50 mg/dL — AB
Hgb urine dipstick: NEGATIVE
Ketones, ur: NEGATIVE mg/dL
Leukocytes, UA: NEGATIVE
Nitrite: POSITIVE — AB
Protein, ur: NEGATIVE mg/dL
Specific Gravity, Urine: 1.02 (ref 1.005–1.030)
pH: 5 (ref 5.0–8.0)

## 2016-08-10 LAB — CBC WITH DIFFERENTIAL/PLATELET
BASOS PCT: 0 %
Basophils Absolute: 0 10*3/uL (ref 0–0.1)
Eosinophils Absolute: 0 10*3/uL (ref 0–0.7)
Eosinophils Relative: 0 %
HEMATOCRIT: 43.6 % (ref 35.0–47.0)
HEMOGLOBIN: 15.1 g/dL (ref 12.0–16.0)
Lymphocytes Relative: 5 %
Lymphs Abs: 0.6 10*3/uL — ABNORMAL LOW (ref 1.0–3.6)
MCH: 31.5 pg (ref 26.0–34.0)
MCHC: 34.5 g/dL (ref 32.0–36.0)
MCV: 91.1 fL (ref 80.0–100.0)
MONOS PCT: 3 %
Monocytes Absolute: 0.3 10*3/uL (ref 0.2–0.9)
NEUTROS ABS: 10.4 10*3/uL — AB (ref 1.4–6.5)
NEUTROS PCT: 92 %
Platelets: 165 10*3/uL (ref 150–440)
RBC: 4.79 MIL/uL (ref 3.80–5.20)
RDW: 13 % (ref 11.5–14.5)
WBC: 11.4 10*3/uL — ABNORMAL HIGH (ref 3.6–11.0)

## 2016-08-10 LAB — BLOOD CULTURE ID PANEL (REFLEXED)
ACINETOBACTER BAUMANNII: NOT DETECTED
CANDIDA ALBICANS: NOT DETECTED
CANDIDA GLABRATA: NOT DETECTED
CANDIDA KRUSEI: NOT DETECTED
Candida parapsilosis: NOT DETECTED
Candida tropicalis: NOT DETECTED
ENTEROBACTER CLOACAE COMPLEX: NOT DETECTED
ENTEROBACTERIACEAE SPECIES: NOT DETECTED
ENTEROCOCCUS SPECIES: NOT DETECTED
ESCHERICHIA COLI: NOT DETECTED
Haemophilus influenzae: NOT DETECTED
Klebsiella oxytoca: NOT DETECTED
Klebsiella pneumoniae: NOT DETECTED
LISTERIA MONOCYTOGENES: NOT DETECTED
NEISSERIA MENINGITIDIS: NOT DETECTED
PSEUDOMONAS AERUGINOSA: NOT DETECTED
Proteus species: NOT DETECTED
STAPHYLOCOCCUS SPECIES: NOT DETECTED
STREPTOCOCCUS AGALACTIAE: DETECTED — AB
STREPTOCOCCUS PNEUMONIAE: NOT DETECTED
STREPTOCOCCUS PYOGENES: NOT DETECTED
Serratia marcescens: NOT DETECTED
Staphylococcus aureus (BCID): NOT DETECTED
Streptococcus species: DETECTED — AB

## 2016-08-10 LAB — LACTIC ACID, PLASMA
LACTIC ACID, VENOUS: 4.6 mmol/L — AB (ref 0.5–1.9)
Lactic Acid, Venous: 4.5 mmol/L (ref 0.5–1.9)
Lactic Acid, Venous: 5.1 mmol/L (ref 0.5–1.9)

## 2016-08-10 LAB — COMPREHENSIVE METABOLIC PANEL WITH GFR
ALT: 36 U/L (ref 14–54)
AST: 57 U/L — ABNORMAL HIGH (ref 15–41)
Albumin: 4 g/dL (ref 3.5–5.0)
Alkaline Phosphatase: 69 U/L (ref 38–126)
Anion gap: 13 (ref 5–15)
BUN: 12 mg/dL (ref 6–20)
CO2: 25 mmol/L (ref 22–32)
Calcium: 9.5 mg/dL (ref 8.9–10.3)
Chloride: 101 mmol/L (ref 101–111)
Creatinine, Ser: 0.68 mg/dL (ref 0.44–1.00)
GFR calc Af Amer: >60 mL/min
GFR calc non Af Amer: >60 mL/min
Glucose, Bld: 310 mg/dL — ABNORMAL HIGH (ref 65–99)
Potassium: 3.7 mmol/L (ref 3.5–5.1)
Sodium: 139 mmol/L (ref 135–145)
Total Bilirubin: 1 mg/dL (ref 0.3–1.2)
Total Protein: 7.6 g/dL (ref 6.5–8.1)

## 2016-08-10 LAB — MAGNESIUM: Magnesium: 1.2 mg/dL — ABNORMAL LOW (ref 1.7–2.4)

## 2016-08-10 LAB — LIPASE, BLOOD: Lipase: 29 U/L (ref 11–51)

## 2016-08-10 LAB — GLUCOSE, CAPILLARY
GLUCOSE-CAPILLARY: 219 mg/dL — AB (ref 65–99)
GLUCOSE-CAPILLARY: 180 mg/dL — AB (ref 65–99)
Glucose-Capillary: 227 mg/dL — ABNORMAL HIGH (ref 65–99)

## 2016-08-10 LAB — PROTIME-INR
INR: 1.1
Prothrombin Time: 14.2 s (ref 11.4–15.2)

## 2016-08-10 LAB — PROCALCITONIN: Procalcitonin: 0.34 ng/mL

## 2016-08-10 LAB — TROPONIN I

## 2016-08-10 MED ORDER — VANCOMYCIN HCL 10 G IV SOLR
1500.0000 mg | INTRAVENOUS | Status: DC
  Administered 2016-08-10: 1500 mg via INTRAVENOUS
  Filled 2016-08-10 (×2): qty 1500

## 2016-08-10 MED ORDER — MORPHINE SULFATE (PF) 4 MG/ML IV SOLN
4.0000 mg | Freq: Once | INTRAVENOUS | Status: AC
  Administered 2016-08-10: 4 mg via INTRAVENOUS

## 2016-08-10 MED ORDER — SODIUM CHLORIDE 0.9 % IV BOLUS (SEPSIS)
3500.0000 mL | Freq: Once | INTRAVENOUS | Status: AC
  Administered 2016-08-10: 3500 mL via INTRAVENOUS

## 2016-08-10 MED ORDER — PIPERACILLIN-TAZOBACTAM 4.5 G IVPB: 4.5000 g | Freq: Three times a day (TID) | INTRAVENOUS | Status: DC

## 2016-08-10 MED ORDER — ASPIRIN EC 81 MG PO TBEC
81.0000 mg | DELAYED_RELEASE_TABLET | Freq: Every day | ORAL | Status: DC
  Administered 2016-08-10 – 2016-08-15 (×6): 81 mg via ORAL
  Filled 2016-08-10 (×6): qty 1

## 2016-08-10 MED ORDER — OXYCODONE HCL 5 MG PO TABS
5.0000 mg | ORAL_TABLET | Freq: Four times a day (QID) | ORAL | Status: DC | PRN
  Administered 2016-08-10 – 2016-08-14 (×11): 5 mg via ORAL
  Filled 2016-08-10 (×11): qty 1

## 2016-08-10 MED ORDER — ENOXAPARIN SODIUM 40 MG/0.4ML ~~LOC~~ SOLN
40.0000 mg | Freq: Two times a day (BID) | SUBCUTANEOUS | Status: DC
  Administered 2016-08-10 – 2016-08-14 (×9): 40 mg via SUBCUTANEOUS
  Filled 2016-08-10 (×10): qty 0.4

## 2016-08-10 MED ORDER — OMEGA-3-ACID ETHYL ESTERS 1 G PO CAPS
1.0000 g | ORAL_CAPSULE | Freq: Every day | ORAL | Status: DC
  Administered 2016-08-11 – 2016-08-15 (×5): 1 g via ORAL
  Filled 2016-08-10 (×5): qty 1

## 2016-08-10 MED ORDER — SODIUM CHLORIDE 0.9 % IV BOLUS (SEPSIS)
1000.0000 mL | INTRAVENOUS | Status: AC
  Administered 2016-08-10: 1000 mL via INTRAVENOUS

## 2016-08-10 MED ORDER — VENLAFAXINE HCL ER 75 MG PO CP24
150.0000 mg | ORAL_CAPSULE | Freq: Every day | ORAL | Status: DC
  Administered 2016-08-10 – 2016-08-15 (×6): 150 mg via ORAL
  Filled 2016-08-10 (×6): qty 2

## 2016-08-10 MED ORDER — VANCOMYCIN HCL IN DEXTROSE 1-5 GM/200ML-% IV SOLN
1000.0000 mg | Freq: Once | INTRAVENOUS | Status: AC
  Administered 2016-08-10: 1000 mg via INTRAVENOUS
  Filled 2016-08-10: qty 200

## 2016-08-10 MED ORDER — DEXTROSE 5 % IV SOLN
INTRAVENOUS | Status: DC
  Administered 2016-08-10 – 2016-08-11 (×2): via INTRAVENOUS
  Filled 2016-08-10 (×10): qty 100

## 2016-08-10 MED ORDER — INSULIN ASPART 100 UNIT/ML ~~LOC~~ SOLN
0.0000 [IU] | Freq: Three times a day (TID) | SUBCUTANEOUS | Status: DC
  Administered 2016-08-10 (×2): 5 [IU] via SUBCUTANEOUS
  Administered 2016-08-11 (×2): 2 [IU] via SUBCUTANEOUS
  Administered 2016-08-11: 3 [IU] via SUBCUTANEOUS
  Administered 2016-08-12: 2 [IU] via SUBCUTANEOUS
  Administered 2016-08-12 (×2): 3 [IU] via SUBCUTANEOUS
  Administered 2016-08-13 (×2): 2 [IU] via SUBCUTANEOUS
  Administered 2016-08-14 (×2): 3 [IU] via SUBCUTANEOUS
  Administered 2016-08-15: 2 [IU] via SUBCUTANEOUS
  Filled 2016-08-10 (×13): qty 1

## 2016-08-10 MED ORDER — POTASSIUM CHLORIDE CRYS ER 20 MEQ PO TBCR
20.0000 meq | EXTENDED_RELEASE_TABLET | Freq: Every day | ORAL | Status: DC
  Administered 2016-08-10 – 2016-08-15 (×6): 20 meq via ORAL
  Filled 2016-08-10 (×6): qty 1

## 2016-08-10 MED ORDER — SODIUM CHLORIDE 0.9 % IV SOLN
INTRAVENOUS | Status: DC
  Administered 2016-08-10 – 2016-08-11 (×2): via INTRAVENOUS

## 2016-08-10 MED ORDER — PIPERACILLIN-TAZOBACTAM 3.375 G IVPB 30 MIN
3.3750 g | Freq: Once | INTRAVENOUS | Status: AC
  Administered 2016-08-10: 3.375 g via INTRAVENOUS

## 2016-08-10 MED ORDER — LOSARTAN POTASSIUM 50 MG PO TABS
100.0000 mg | ORAL_TABLET | Freq: Every day | ORAL | Status: DC
  Administered 2016-08-10 – 2016-08-15 (×6): 100 mg via ORAL
  Filled 2016-08-10 (×6): qty 2

## 2016-08-10 MED ORDER — ACETAMINOPHEN 325 MG PO TABS
650.0000 mg | ORAL_TABLET | Freq: Four times a day (QID) | ORAL | Status: DC | PRN
  Administered 2016-08-10 – 2016-08-12 (×4): 650 mg via ORAL
  Filled 2016-08-10 (×4): qty 2

## 2016-08-10 MED ORDER — ALLOPURINOL 100 MG PO TABS
100.0000 mg | ORAL_TABLET | Freq: Two times a day (BID) | ORAL | Status: DC
  Administered 2016-08-10 – 2016-08-15 (×11): 100 mg via ORAL
  Filled 2016-08-10 (×11): qty 1

## 2016-08-10 MED ORDER — MECLIZINE HCL 25 MG PO TABS
25.0000 mg | ORAL_TABLET | Freq: Three times a day (TID) | ORAL | Status: DC | PRN
  Filled 2016-08-10: qty 1

## 2016-08-10 MED ORDER — ACETAMINOPHEN 650 MG RE SUPP: 650.0000 mg | Freq: Four times a day (QID) | RECTAL | Status: DC | PRN

## 2016-08-10 MED ORDER — MAGNESIUM SULFATE 2 GM/50ML IV SOLN
2.0000 g | Freq: Once | INTRAVENOUS | Status: AC
  Administered 2016-08-10: 2 g via INTRAVENOUS
  Filled 2016-08-10: qty 50

## 2016-08-10 MED ORDER — ATORVASTATIN CALCIUM 20 MG PO TABS
40.0000 mg | ORAL_TABLET | Freq: Every day | ORAL | Status: DC
  Administered 2016-08-10 – 2016-08-14 (×5): 40 mg via ORAL
  Filled 2016-08-10 (×5): qty 2

## 2016-08-10 MED ORDER — MORPHINE SULFATE (PF) 4 MG/ML IV SOLN
INTRAVENOUS | Status: AC
  Filled 2016-08-10: qty 1

## 2016-08-10 MED ORDER — PIPERACILLIN-TAZOBACTAM 4.5 G IVPB
4.5000 g | Freq: Three times a day (TID) | INTRAVENOUS | Status: DC
  Filled 2016-08-10 (×2): qty 100

## 2016-08-10 MED ORDER — ADULT MULTIVITAMIN W/MINERALS CH
1.0000 | ORAL_TABLET | Freq: Every day | ORAL | Status: DC
  Administered 2016-08-11 – 2016-08-15 (×5): 1 via ORAL
  Filled 2016-08-10 (×5): qty 1

## 2016-08-10 MED ORDER — INSULIN ASPART 100 UNIT/ML ~~LOC~~ SOLN: 0.0000 [IU] | Freq: Every day | SUBCUTANEOUS | Status: DC

## 2016-08-10 NOTE — Progress Notes (Signed)
Pt was helped to the bedside commode to void. Pt expressed to RN that she does not have the urge to void at this time. RN expressed the need to void especially since she has had over 4 liters of fluid since she has been admitted today 08/10/2016 and still has not voided. Pt voided 550 mL, RN checked post residual volume and received 75 mL. Will continue to monitor pt.   Elsy Chiang [employer]

## 2016-08-10 NOTE — ED Provider Notes (Signed)
Patient CT negative for stone. Admitted for sepsis secondary to UTI   Lavonia Drafts, MD 08/10/16 [phone]

## 2016-08-10 NOTE — Progress Notes (Signed)
Pharmacy Antibiotic Note  Tina Short is a 61 y.o. female admitted on 08/10/2016 with sepsis.  Pharmacy has been consulted for vancomycin and piperacillin/tazobactam dosing.  Plan: Piperacillin/tazobactam 3.375 g IV q8h EI  Vancomycin 1000 mg given in ED.  Order vancomycin 1500 mg IV q24h (6 hour stack) Goal VT 15-20 mcg/mL  Kinetics: Adjusted body weight = 94 kg, CrCl 47 mL/min Ke: 0.043 Half-life: 16 hrs Vd: 66 L Cmin ~16 mcg/mL (estimated)  Height: 5\' 8"  (172.7 cm) Weight: (!) 309 lb (140.2 kg) IBW/kg (Calculated) : 63.9  Temp (24hrs), Avg:101.1 F (38.4 C), Min:101.1 F (38.4 C), Max:101.1 F (38.4 C)   Recent Labs Lab 08/10/16 0606 08/10/16 0630  WBC 11.4*  --   CREATININE 0.68  --   LATICACIDVEN  --  4.5*    Estimated Creatinine Clearance: 108.7 mL/min (by C-G formula based on SCr of 0.68 mg/dL).    Allergies  Allergen Reactions  . Abilify  [Aripiprazole]     Other reaction(s): Difficulty breathing  . Invokana [Canagliflozin] Itching    Yeast infections   Antimicrobials this admission: vancomycin 7/1 >>  Piperacillin/tazobactam 7/1 >>   Dose adjustments this admission:  Microbiology results: 7/1 BCx: Sent 7/1 UCx: Sent   Thank you for allowing pharmacy to be a part of this patient's care.  Lenis Noon, PharmD Clinical Pharmacist 08/10/2016 10:26 AM

## 2016-08-10 NOTE — ED Triage Notes (Signed)
Patient reports having lower abdominal pain with vomiting, chills and dizziness since "bed time" Saturday night.

## 2016-08-10 NOTE — ED Provider Notes (Signed)
[facility] Emergency Department Provider Note  ____________________________________________  First provider contact:  6:06 am (approximate)  I have reviewed the triage vital signs and the nursing notes.   Level 5 caveat:  history/ROS limited by acute/critical illness   HISTORY  Chief Complaint Abdominal Pain; Emesis; and Dizziness    HPI Tina Short is a 61 y.o. female with medical history as listed below who presents for evaluation of severe lower back pain mostly on the right side, lower abdominal pain, vomiting, chills, and general malaise over the last approximately 7 hours.  She has gradually gotten worse.  She states she was in her normal state of health yesterday and only began to feel ill and chills/rigors when she went to bed.  The symptoms are gradually gotten worse or currently severe, and she is in moderate to severe distress, cannot remain still in the stretcher, writhing around and crying out in pain.  She has had some vomiting and multiple loose stools over the course of the last day, but she does occasionally have loose stools from time to time due to her diabetes medication.  She had a fever of approximately 101 when she arrived to the emergency department and has tachycardia in the 120s and tachypnea in the mid to upper 20s.    She denies any recent breathing difficulties and is currently not having any shortness of breath but she is breathing fast because of pain.   Past Medical History:  Diagnosis Date  . Depression   . Diabetes ([place])   . Hyperlipidemia   . Hypertension   . Neuropathy due to type 2 diabetes mellitus ([place])   . Obesity, morbid ([place]) 11/15/2009  . Sleep apnea    cpap  . Swelling     Patient Active Problem List   Diagnosis Date Noted  . Encounter for screening colonoscopy 04/30/2016  . Major depression, recurrent, chronic ([place]) 10/30/2015  . Dyslipidemia associated with type 2 diabetes mellitus ([place]) 10/30/2015   . Vitamin D deficiency 07/27/2015  . Vitamin B12 deficiency 07/27/2015  . History of anemia 09/12/2014  . Carpal tunnel syndrome 09/12/2014  . Obstructive sleep apnea 09/12/2014  . Edema extremities 09/12/2014  . Gout 09/12/2014  . Extreme obesity 11/15/2009  . Benign essential HTN 10/07/2006  . Type 2 diabetes mellitus, uncontrolled, with neuropathy ([place]) 10/07/2006  . Hyperlipidemia 10/07/2006    Past Surgical History:  Procedure Laterality Date  . ABDOMINAL HYSTERECTOMY    . COLONOSCOPY    . COLONOSCOPY WITH PROPOFOL N/A 05/14/2016   Procedure: COLONOSCOPY WITH PROPOFOL;  Surgeon: [name], MD;  Location: [facility] ENDOSCOPY;  Service: Endoscopy;  Laterality: N/A;  . FOOT SURGERY Right    X2  . SHOULDER SURGERY Right   . WRIST SURGERY Bilateral     Prior to Admission medications   Medication Sig Start Date End Date Taking? Authorizing Provider  ACCU-CHEK AVIVA PLUS test strip Test once daily 02/21/15   Ashok Norris, MD  allopurinol (ZYLOPRIM) 100 MG tablet TAKE 1 TABLET BY MOUTH TWO  TIMES DAILY 03/13/16   Steele Sizer, MD  aspirin EC 81 MG tablet Take 81 mg by mouth daily.    [provider]  atorvastatin (LIPITOR) 40 MG tablet TAKE 1 TABLET BY MOUTH AT  BEDTIME 07/09/16   Hubbard Hartshorn, FNP  Dulaglutide (TRULICITY) 1.5 IR/5.1OA SOPN Inject 1.5 mg into the skin once a week. 02/09/16   Steele Sizer, MD  furosemide (LASIX) 40 MG tablet TAKE 1  TABLET BY MOUTH TWO  TIMES DAILY AS NEEDED 03/12/16   Steele Sizer, MD  losartan (COZAAR) 100 MG tablet TAKE 1 TABLET BY MOUTH  DAILY 07/09/16   Hubbard Hartshorn, FNP  meclizine (ANTIVERT) 25 MG tablet Take 1 tablet (25 mg total) by mouth 3 (three) times daily as needed for dizziness. 07/03/16   Roselee Nova, MD  metformin (FORTAMET) 1000 MG (OSM) 24 hr tablet TAKE 2 TABLETS BY MOUTH  DAILY WITH BREAKFAST 04/23/16   Steele Sizer, MD  Multiple Vitamin (MULTIVITAMIN) capsule Take 1 capsule by mouth daily.    [provider]  omega-3 acid ethyl esters (LOVAZA) 1 g capsule TAKE 2 CAPSULES BY MOUTH  TWO TIMES DAILY 04/20/16   Ancil Boozer, Drue Stager, MD  potassium chloride SA (K-DUR,KLOR-CON) 20 MEQ tablet Take 1 tablet (20 mEq total) by mouth daily. 07/11/16   Hubbard Hartshorn, FNP  promethazine (PHENERGAN) 25 MG tablet Take 1 tablet (25 mg total) by mouth every 8 (eight) hours as needed for nausea or vomiting. 07/03/16   Roselee Nova, MD  venlafaxine XR (EFFEXOR-XR) 150 MG 24 hr capsule TAKE 2 CAPSULES BY MOUTH  DAILY WITH BREAKFAST. 07/09/16   Hubbard Hartshorn, FNP  Vitamin D, Ergocalciferol, (DRISDOL) 50000 units CAPS capsule TAKE 1 CAPSULE BY MOUTH  EVERY 7 DAYS 06/20/16   Steele Sizer, MD    Allergies Abilify  [aripiprazole] and Invokana [canagliflozin]  Family History  Problem Relation Age of Onset  . Diabetes Mother   . Cancer Mother        uterine  . Mental illness Father   . Obesity Daughter   . Diabetes Maternal Grandmother   . Heart disease Brother   . Obesity Daughter     Social History Social History  Substance Use Topics  . Smoking status: Never Smoker  . Smokeless tobacco: Never Used  . Alcohol use No    Review of Systems Constitutional: Fever to 101 in the ED and rigors/chills at home Eyes: No visual changes. ENT: No sore throat. Cardiovascular: Denies chest pain. Respiratory: Denies shortness of breath. Increased respiratory rate Gastrointestinal: Abdominal pain on the right as well as right flank pain and right low back pain.  Vomiting, nausea, multiple episodes of loose stools Genitourinary: Negative for dysuria. Musculoskeletal: Negative for neck pain.  Right low back pain and right flank pain Integumentary: Negative for rash. Neurological: Negative for headaches, focal weakness or numbness.   ____________________________________________   PHYSICAL EXAM:  VITAL SIGNS: ED Triage Vitals  Enc Vitals Group     BP 08/10/16 0535 114/73     Pulse Rate 08/10/16 0535  (!) 120     Resp 08/10/16 0535 (!) 24     Temp 08/10/16 0535 (!) 101.1 F (38.4 C)     Temp Source 08/10/16 0535 Oral     SpO2 08/10/16 0535 95 %     Weight 08/10/16 0531 (!) 140.2 kg (309 lb)     Height 08/10/16 0531 1.727 m (5\' 8" )     Head Circumference --      Peak Flow --      Pain Score 08/10/16 0530 7     Pain Loc --      Pain Edu? --      Excl. in [place]? --     Constitutional: Alert and oriented. Nontoxic appearing but is in severe distress, cannot remain still, reporting severe pain Eyes: Conjunctivae are normal.  Head: Atraumatic. Nose: No congestion/rhinnorhea. Mouth/Throat: Mucous  membranes are moist. Neck: No stridor.  No meningeal signs.   Cardiovascular: Moderate tachycardia, regular rhythm. Good peripheral circulation. Grossly normal heart sounds. Respiratory: Normal respiratory effort.  No retractions. Lungs CTAB. Gastrointestinal: Morbid obesity.  Soft with generalized tenderness throughout abdomen but not generalized peritonitis.  No CVA tenderness on either side Musculoskeletal: No lower extremity tenderness nor edema. No gross deformities of extremities. Neurologic:  Normal speech and language. No gross focal neurologic deficits are appreciated.  Skin:  Skin is pale, warm, dry and intact. No rash noted.   ____________________________________________   LABS (all labs ordered are listed, but only abnormal results are displayed)  Labs Reviewed  COMPREHENSIVE METABOLIC PANEL - Abnormal; Notable for the following:       Result Value   Glucose, Bld 310 (*)    AST 57 (*)    All other components within normal limits  MAGNESIUM - Abnormal; Notable for the following:    Magnesium 1.2 (*)    All other components within normal limits  CBC WITH DIFFERENTIAL/PLATELET - Abnormal; Notable for the following:    WBC 11.4 (*)    Neutro Abs 10.4 (*)    Lymphs Abs 0.6 (*)    All other components within normal limits  LACTIC ACID, PLASMA - Abnormal; Notable for the  following:    Lactic Acid, Venous 4.5 (*)    All other components within normal limits  GASTROINTESTINAL PANEL BY PCR, STOOL (REPLACES STOOL CULTURE)  C DIFFICILE QUICK SCREEN W PCR REFLEX  CULTURE, BLOOD (ROUTINE X 2)  CULTURE, BLOOD (ROUTINE X 2)  URINE CULTURE  LIPASE, BLOOD  TROPONIN I  PROCALCITONIN  PROTIME-INR  URINALYSIS, COMPLETE (UACMP) WITH MICROSCOPIC  LACTIC ACID, PLASMA   ____________________________________________  EKG  ED ECG REPORT I, Caliah Kopke, the attending physician, personally viewed and interpreted this ECG.  Date: 08/10/2016 EKG Time: 05:35 Rate: 121 Rhythm: Sinus tachycardia with a prolonged QTC QRS Axis: normal Intervals: Prolonged QTC at 599 ms ST/T Wave abnormalities: normal Narrative Interpretation: unremarkable except for abnormal intervals  ____________________________________________  RADIOLOGY   Dg Chest Port 1 View  Result Date: 08/10/2016 CLINICAL DATA:  61 y/o  F; sepsis. EXAM: PORTABLE CHEST 1 VIEW COMPARISON:  07/03/2016 chest radiograph FINDINGS: Stable heart size and mediastinal contours are within normal limits given projection and technique. No focal consolidation, effusion, or pneumothorax. Right shoulder rotator cuff repair changes. IMPRESSION: No acute pulmonary process identified. Electronically Signed   By: Kristine Garbe M.D.   On: 08/10/2016 06:42    ____________________________________________   PROCEDURES  Critical Care performed: Yes, see critical care procedure note(s)   Procedure(s) performed:   .Critical Care Performed by: Hinda Kehr Authorized by: Hinda Kehr   Critical care provider statement:    Critical care time (minutes):  30   Critical care time was exclusive of:  Separately billable procedures and treating other patients   Critical care was necessary to treat or prevent imminent or life-threatening deterioration of the following conditions:  Sepsis   Critical care was time  spent personally by me on the following activities:  Development of treatment plan with patient or surrogate, discussions with consultants, evaluation of patient's response to treatment, examination of patient, obtaining history from patient or surrogate, ordering and performing treatments and interventions, ordering and review of laboratory studies, ordering and review of radiographic studies, pulse oximetry, re-evaluation of patient's condition and review of old charts      ____________________________________________   [place] / [place] / ED  COURSE  Pertinent labs & imaging results that were available during my care of the patient were reviewed by me and considered in my medical decision making (see chart for details).   Clinical Course as of Aug 10 725  Sun Aug 10, 2016  0160 Patient has fever >101, tachycardia in the 120s, and tachypnea, with severe back pain and some abdominal pain.  Meets criteria for sepsis.  Attempting to establish PIVs, will start with empiric fluids and antibiotics.  Suspect infected ureteral stone based on patient's pain and inability to stay still.  Analgesia and imaging after labs obtained.  [CF]  0711 Patient's lactic acid is 4.5 suggesting severe sepsis.  I have ordered an additional 3.5 L of fluid to bring her to the 30 mL/kg goal but have asked them to administer them sequentially so that we can monitor her breathing more accurately.  Her magnesium level is 1.2 and I have ordered 2 g of magnesium sulfate via IV.  Comprehensive metabolic panel is otherwise reassuring, troponin is negative, lipase is normal, very slight leukocytosis of 11.4.  Stool studies are still pending because the patient has had multiple episodes of diarrhea but I suspect this will be relatively low yield.  I will proceed with renal study protocol CT scan given my concern that the patient may have an infected kidney stone resulting in the flank and abdominal pain she is  currently experiencing.  Patient is receiving in and out catheterization now.  [CF]  [phone] Transferring emergency department care to Dr. Corky Downs to follow-up on the results and disposition the patient appropriately.  [CF]    Clinical Course User Index [CF] Hinda Kehr, MD    ____________________________________________  FINAL CLINICAL IMPRESSION(S) / ED DIAGNOSES  Final diagnoses:  Severe sepsis ([place])  Acute right-sided low back pain, with sciatica presence unspecified  Hypomagnesemia  QT prolongation     MEDICATIONS GIVEN DURING THIS VISIT:  Medications  vancomycin (VANCOCIN) IVPB 1000 mg/200 mL premix (not administered)  sodium chloride 0.9 % bolus 3,500 mL (not administered)  magnesium sulfate IVPB 2 g 50 mL (not administered)  sodium chloride 0.9 % bolus 1,000 mL (0 mLs Intravenous Stopped 08/10/16 0722)  morphine 4 MG/ML injection 4 mg (4 mg Intravenous Given 08/10/16 0610)  piperacillin-tazobactam (ZOSYN) IVPB 3.375 g (0 g Intravenous Stopped 08/10/16 0722)     NEW OUTPATIENT MEDICATIONS STARTED DURING THIS VISIT:  New Prescriptions   No medications on file    Modified Medications   No medications on file    Discontinued Medications   No medications on file     Note:  This document was prepared using Dragon voice recognition software and may include unintentional dictation errors.    Hinda Kehr, MD 08/10/16 [phone]

## 2016-08-10 NOTE — Progress Notes (Signed)
Lab notified RN that pt has a critical lactic acid at 4.6. Prime doctor notified of findings, no new orders given at this time. Will continue to monitor pt.  Kamaree Wheatley [employer]

## 2016-08-10 NOTE — ED Notes (Signed)
Report to kim, rn.  

## 2016-08-10 NOTE — Progress Notes (Signed)
Anticoagulation monitoring(Lovenox):  61 yo female ordered Lovenox 40 mg Q24h  Filed Weights   08/10/16 0531 08/10/16 1150  Weight: (!) 309 lb (140.2 kg) (!) 303 lb 4.8 oz (137.6 kg)   BMI 46.2   Lab Results  Component Value Date   CREATININE 0.68 08/10/2016   CREATININE 0.56 (L) 07/03/2016   CREATININE 0.54 (L) 01/23/2016   Estimated Creatinine Clearance: 107.5 mL/min (by C-G formula based on SCr of 0.68 mg/dL). Hemoglobin & Hematocrit     Component Value Date/Time   HGB 15.1 08/10/2016 0606   HGB 15.3 07/03/2016 1233   HCT 43.6 08/10/2016 0606   HCT 45.9 07/03/2016 1233     Per Protocol for Patient with estCrcl > 30 ml/min and BMI > 40, will transition to Lovenox 40 mg Q12h.

## 2016-08-10 NOTE — ED Notes (Signed)
Attempt int initiation x2 without success. Lea, rn in to attempt iv.

## 2016-08-10 NOTE — H&P (Signed)
Tina Short is an 61 y.o. female.   Chief Complaint: Back pain and nausea. HPI: This is 61 year old female who last night started having fever chills and back pain. Said she thought she had a kidney stone appointment never had a history of it. Pains all better now but she became nauseated and vomited. In the ER she's found to have a urinary tract infection and a be septic.  Past Medical History:  Diagnosis Date  . Depression   . Diabetes ([place])   . Hyperlipidemia   . Hypertension   . Neuropathy due to type 2 diabetes mellitus ([place])   . Obesity, morbid ([place]) 11/15/2009  . Sleep apnea    cpap  . Swelling     Past Surgical History:  Procedure Laterality Date  . ABDOMINAL HYSTERECTOMY    . COLONOSCOPY    . COLONOSCOPY WITH PROPOFOL N/A 05/14/2016   Procedure: COLONOSCOPY WITH PROPOFOL;  Surgeon: [name], MD;  Location: [facility] ENDOSCOPY;  Service: Endoscopy;  Laterality: N/A;  . FOOT SURGERY Right    X2  . SHOULDER SURGERY Right   . WRIST SURGERY Bilateral     Family History  Problem Relation Age of Onset  . Diabetes Mother   . Cancer Mother        uterine  . Mental illness Father   . Obesity Daughter   . Diabetes Maternal Grandmother   . Heart disease Brother   . Obesity Daughter    Social History:  reports that she has never smoked. She has never used smokeless tobacco. She reports that she does not drink alcohol or use drugs.  Allergies:  Allergies  Allergen Reactions  . Abilify  [Aripiprazole]     Other reaction(s): Difficulty breathing  . Invokana [Canagliflozin] Itching    Yeast infections     (Not in a hospital admission)  Results for orders placed or performed during the hospital encounter of 08/10/16 (from the past 48 hour(s))  Lipase, blood     Status: None   Collection Time: 08/10/16  6:06 AM  Result Value Ref Range   Lipase 29 11 - 51 U/L  Comprehensive metabolic panel     Status: Abnormal   Collection Time: 08/10/16  6:06 AM  Result Value  Ref Range   Sodium 139 135 - 145 mmol/L   Potassium 3.7 3.5 - 5.1 mmol/L   Chloride 101 101 - 111 mmol/L   CO2 25 22 - 32 mmol/L   Glucose, Bld 310 (H) 65 - 99 mg/dL   BUN 12 6 - 20 mg/dL   Creatinine, Ser 0.68 0.44 - 1.00 mg/dL   Calcium 9.5 8.9 - 10.3 mg/dL   Total Protein 7.6 6.5 - 8.1 g/dL   Albumin 4.0 3.5 - 5.0 g/dL   AST 57 (H) 15 - 41 U/L   ALT 36 14 - 54 U/L   Alkaline Phosphatase 69 38 - 126 U/L   Total Bilirubin 1.0 0.3 - 1.2 mg/dL   GFR calc non Af Amer >60 >60 mL/min   GFR calc Af Amer >60 >60 mL/min    Comment: (NOTE) The eGFR has been calculated using the CKD EPI equation. This calculation has not been validated in all clinical situations. eGFR's persistently <60 mL/min signify possible Chronic Kidney Disease.    Anion gap 13 5 - 15  Magnesium     Status: Abnormal   Collection Time: 08/10/16  6:06 AM  Result Value Ref Range   Magnesium 1.2 (L) 1.7 - 2.4  mg/dL  CBC with Differential/Platelet     Status: Abnormal   Collection Time: 08/10/16  6:06 AM  Result Value Ref Range   WBC 11.4 (H) 3.6 - 11.0 K/uL   RBC 4.79 3.80 - 5.20 MIL/uL   Hemoglobin 15.1 12.0 - 16.0 g/dL   HCT 43.6 35.0 - 47.0 %   MCV 91.1 80.0 - 100.0 fL   MCH 31.5 26.0 - 34.0 pg   MCHC 34.5 32.0 - 36.0 g/dL   RDW 13.0 11.5 - 14.5 %   Platelets 165 150 - 440 K/uL   Neutrophils Relative % 92 %   Neutro Abs 10.4 (H) 1.4 - 6.5 K/uL   Lymphocytes Relative 5 %   Lymphs Abs 0.6 (L) 1.0 - 3.6 K/uL   Monocytes Relative 3 %   Monocytes Absolute 0.3 0.2 - 0.9 K/uL   Eosinophils Relative 0 %   Eosinophils Absolute 0.0 0 - 0.7 K/uL   Basophils Relative 0 %   Basophils Absolute 0.0 0 - 0.1 K/uL  Troponin I     Status: None   Collection Time: 08/10/16  6:06 AM  Result Value Ref Range   Troponin I <0.03 <0.03 ng/mL  Procalcitonin     Status: None   Collection Time: 08/10/16  6:06 AM  Result Value Ref Range   Procalcitonin 0.34 ng/mL    Comment:        Interpretation: PCT (Procalcitonin) <= 0.5  ng/mL: Systemic infection (sepsis) is not likely. Local bacterial infection is possible. (NOTE)         ICU PCT Algorithm               Non ICU PCT Algorithm    ----------------------------     ------------------------------         PCT < 0.25 ng/mL                 PCT < 0.1 ng/mL     Stopping of antibiotics            Stopping of antibiotics       strongly encouraged.               strongly encouraged.    ----------------------------     ------------------------------       PCT level decrease by               PCT < 0.25 ng/mL       >= 80% from peak PCT       OR PCT 0.25 - 0.5 ng/mL          Stopping of antibiotics                                             encouraged.     Stopping of antibiotics           encouraged.    ----------------------------     ------------------------------       PCT level decrease by              PCT >= 0.25 ng/mL       < 80% from peak PCT        AND PCT >= 0.5 ng/mL            Continuin g antibiotics  encouraged.       Continuing antibiotics            encouraged.    ----------------------------     ------------------------------     PCT level increase compared          PCT > 0.5 ng/mL         with peak PCT AND          PCT >= 0.5 ng/mL             Escalation of antibiotics                                          strongly encouraged.      Escalation of antibiotics        strongly encouraged.   Protime-INR     Status: None   Collection Time: 08/10/16  6:06 AM  Result Value Ref Range   Prothrombin Time 14.2 11.4 - 15.2 seconds   INR 1.10   Lactic acid, plasma     Status: Abnormal   Collection Time: 08/10/16  6:30 AM  Result Value Ref Range   Lactic Acid, Venous 4.5 (HH) 0.5 - 1.9 mmol/L    Comment: CRITICAL RESULT CALLED TO, READ BACK BY AND VERIFIED WITH STEPHANIE RUDD ON 08/10/16 AT 0703 BY [facility]   Urinalysis, Complete w Microscopic     Status: Abnormal   Collection Time: 08/10/16  8:52 AM  Result  Value Ref Range   Color, Urine YELLOW (A) YELLOW   APPearance HAZY (A) CLEAR   Specific Gravity, Urine 1.020 1.005 - 1.030   pH 5.0 5.0 - 8.0   Glucose, UA 50 (A) NEGATIVE mg/dL   Hgb urine dipstick NEGATIVE NEGATIVE   Bilirubin Urine NEGATIVE NEGATIVE   Ketones, ur NEGATIVE NEGATIVE mg/dL   Protein, ur NEGATIVE NEGATIVE mg/dL   Nitrite POSITIVE (A) NEGATIVE   Leukocytes, UA NEGATIVE NEGATIVE   RBC / HPF 0-5 0 - 5 RBC/hpf   WBC, UA 0-5 0 - 5 WBC/hpf   Bacteria, UA RARE (A) NONE SEEN   Squamous Epithelial / LPF 0-5 (A) NONE SEEN   Dg Chest Port 1 View  Result Date: 08/10/2016 CLINICAL DATA:  61 y/o  F; sepsis. EXAM: PORTABLE CHEST 1 VIEW COMPARISON:  07/03/2016 chest radiograph FINDINGS: Stable heart size and mediastinal contours are within normal limits given projection and technique. No focal consolidation, effusion, or pneumothorax. Right shoulder rotator cuff repair changes. IMPRESSION: No acute pulmonary process identified. Electronically Signed   By: Kristine Garbe M.D.   On: 08/10/2016 06:42   Ct Renal Stone Study  Result Date: 08/10/2016 CLINICAL DATA:  Lower abdominal pain with vomiting, chills and dizziness since bedtime Saturday night, history benign essential hypertension, type II diabetes mellitus, sleep apnea EXAM: CT ABDOMEN AND PELVIS WITHOUT CONTRAST TECHNIQUE: Multidetector CT imaging of the abdomen and pelvis was performed following the standard protocol without IV contrast. Sagittal and coronal MPR images reconstructed from axial data set. Oral contrast was not administered. COMPARISON:  None FINDINGS: Lower chest: Minimal dependent atelectasis at RIGHT lung base Hepatobiliary: Liver appears enlarged. Probable hepatic fatty infiltration with focal sparing adjacent to gallbladder fossa. No additional focal hepatic or gallbladder abnormalities. No biliary dilatation. 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 Adrenal glands, kidneys, ureters, and bladder  otherwise normal appearance. Stomach/Bowel: Normal appendix. Minimal sigmoid diverticulosis. Stomach and bowel loops otherwise normal appearance. Vascular/Lymphatic: Atherosclerotic calcifications aorta without aneurysm. Scattered pelvic phleboliths. Numerous though normal sized lymph nodes identified retroperitoneal, mesenteric, periportal, and LEFT external iliac. No adenopathy. Reproductive: Uterus and ovaries normal appearance Other: No free air or free fluid. No hernia or acute inflammatory process. Musculoskeletal: Scattered degenerative disc and facet disease changes of thoracolumbar spine IMPRESSION: No acute intra-abdominal or intrapelvic abnormalities. Probable hepatomegaly with fatty infiltration of liver and focal sparing adjacent to gallbladder fossa. 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 Minimal sigmoid diverticulosis. Aortic Atherosclerosis (ICD10-I70.0). Electronically Signed   By: Lavonia Dana M.D.   On: 08/10/2016 07:57    Review of Systems  Constitutional: Positive for chills and fever.  HENT: Negative for hearing loss.   Eyes: Negative for blurred vision.  Respiratory: Positive for shortness of breath. Negative for cough.   Cardiovascular: Negative for chest pain.  Gastrointestinal: Positive for abdominal pain and nausea.  Genitourinary: Negative for dysuria.  Musculoskeletal: Positive for back pain.  Skin: Negative for rash.  Neurological: Negative for dizziness.    Blood pressure (!) 139/58, pulse (!) 110, temperature (!) 101.1 F (38.4 C), temperature source Oral, resp. rate (!) 35, height '5\' 8"'  (1.727 m), weight (!) 140.2 kg (309 lb), SpO2 91 %. Physical Exam  Constitutional: She is oriented to person, place, and time.  Ill-appearing female in mild distress.  HENT:  Head: Normocephalic and atraumatic.  Or mucosa dry  Eyes: Pupils are equal, round, and  reactive to light. Left eye exhibits no discharge.  Neck: Neck supple. No JVD present. No tracheal deviation present. No thyromegaly present.  Cardiovascular:  Tachycardic with no murmurs  Respiratory: Breath sounds normal. No respiratory distress. She has no wheezes. She has no rales. She exhibits no tenderness.  GI: Soft. Bowel sounds are normal. She exhibits no distension and no mass.  Musculoskeletal: She exhibits edema. She exhibits no tenderness.  Lymphadenopathy:    She has no cervical adenopathy.  Neurological: She is alert and oriented to person, place, and time. No cranial nerve deficit.     Assessment/Plan 1. Sepsis. Patient's tachycardic and has fever. UTIs likely the source of sepsis. We'll be aggressive with IV fluids and treat underlying calls. I will hold her Lasix while giving IV fluids. 2. Urinary tract infection. She's been started on Zosyn and vancomycin because of her associated sepsis. We'll go ahead and get blood and urine cultures and adjust antibiotics as warranted. 3. Diabetes. Going to hold her insulin. Put her on some sliding scale. 4. Depression. Continue current medications Total time spent 45 minutes  Baxter Hire, MD 08/10/2016, 9:59 AM

## 2016-08-10 NOTE — Progress Notes (Addendum)
PHARMACY - PHYSICIAN COMMUNICATION CRITICAL VALUE ALERT - BLOOD CULTURE IDENTIFICATION (BCID)  Results for orders placed or performed during the hospital encounter of 08/10/16  Blood Culture ID Panel (Reflexed) (Collected: 08/10/2016  6:30 AM)  Result Value Ref Range   Enterococcus species NOT DETECTED NOT DETECTED   Listeria monocytogenes NOT DETECTED NOT DETECTED   Staphylococcus species NOT DETECTED NOT DETECTED   Staphylococcus aureus NOT DETECTED NOT DETECTED   Streptococcus species DETECTED (A) NOT DETECTED   Streptococcus agalactiae DETECTED (A) NOT DETECTED   Streptococcus pneumoniae NOT DETECTED NOT DETECTED   Streptococcus pyogenes NOT DETECTED NOT DETECTED   Acinetobacter baumannii NOT DETECTED NOT DETECTED   Enterobacteriaceae species NOT DETECTED NOT DETECTED   Enterobacter cloacae complex NOT DETECTED NOT DETECTED   Escherichia coli NOT DETECTED NOT DETECTED   Klebsiella oxytoca NOT DETECTED NOT DETECTED   Klebsiella pneumoniae NOT DETECTED NOT DETECTED   Proteus species NOT DETECTED NOT DETECTED   Serratia marcescens NOT DETECTED NOT DETECTED   Haemophilus influenzae NOT DETECTED NOT DETECTED   Neisseria meningitidis NOT DETECTED NOT DETECTED   Pseudomonas aeruginosa NOT DETECTED NOT DETECTED   Candida albicans NOT DETECTED NOT DETECTED   Candida glabrata NOT DETECTED NOT DETECTED   Candida krusei NOT DETECTED NOT DETECTED   Candida parapsilosis NOT DETECTED NOT DETECTED   Candida tropicalis NOT DETECTED NOT DETECTED    Name of physician (or Provider) Contacted: Kalis[place]  Changes to prescribed antibiotics required: Yes, will d/c vancomycin but continue Zosyn.   Julia Alkhatib D 08/10/2016  7:33 PM

## 2016-08-11 ENCOUNTER — Inpatient Hospital Stay: Payer: 59

## 2016-08-11 LAB — URINE CULTURE: Culture: <10000 — AB

## 2016-08-11 LAB — GLUCOSE, CAPILLARY
GLUCOSE-CAPILLARY: 150 mg/dL — AB (ref 65–99)
GLUCOSE-CAPILLARY: 164 mg/dL — AB (ref 65–99)
GLUCOSE-CAPILLARY: 137 mg/dL — AB (ref 65–99)
Glucose-Capillary: 142 mg/dL — ABNORMAL HIGH (ref 65–99)

## 2016-08-11 LAB — CBC
HCT: 36.5 % (ref 35.0–47.0)
Hemoglobin: 12.6 g/dL (ref 12.0–16.0)
MCH: 31.7 pg (ref 26.0–34.0)
MCHC: 34.5 g/dL (ref 32.0–36.0)
MCV: 91.8 fL (ref 80.0–100.0)
PLATELETS: 143 10*3/uL — AB (ref 150–440)
RBC: 3.98 MIL/uL (ref 3.80–5.20)
RDW: 13.4 % (ref 11.5–14.5)
WBC: 11.5 10*3/uL — AB (ref 3.6–11.0)

## 2016-08-11 LAB — BASIC METABOLIC PANEL
Anion gap: 5 (ref 5–15)
BUN: 14 mg/dL (ref 6–20)
CALCIUM: 7.8 mg/dL — AB (ref 8.9–10.3)
CHLORIDE: 110 mmol/L (ref 101–111)
CO2: 24 mmol/L (ref 22–32)
CREATININE: 0.69 mg/dL (ref 0.44–1.00)
Glucose, Bld: 160 mg/dL — ABNORMAL HIGH (ref 65–99)
Potassium: 3.5 mmol/L (ref 3.5–5.1)
SODIUM: 139 mmol/L (ref 135–145)

## 2016-08-11 MED ORDER — DEXTROSE 5 % IV SOLN
2.0000 g | INTRAVENOUS | Status: DC
  Administered 2016-08-11 – 2016-08-14 (×4): 2 g via INTRAVENOUS
  Filled 2016-08-11 (×6): qty 2

## 2016-08-11 MED ORDER — FUROSEMIDE 10 MG/ML IJ SOLN
40.0000 mg | Freq: Once | INTRAMUSCULAR | Status: AC
  Administered 2016-08-11: 40 mg via INTRAVENOUS
  Filled 2016-08-11: qty 4

## 2016-08-11 MED FILL — Dextrose Inj 5%: INTRAVENOUS | Qty: 100 | Status: AC

## 2016-08-11 MED FILL — Piperacillin Sod-Tazobactam Sod For Inj 2.25 GM (2-0.25 GM): INTRAVENOUS | Qty: 4.5 | Status: AC

## 2016-08-11 NOTE — Progress Notes (Addendum)
Dr Edwina Barth requested that isolation be discontinued and labs be discontinued. IVF to be decreased to 74ml/hr

## 2016-08-11 NOTE — Progress Notes (Signed)
Subjective: Patient alert and awake. Likely little bit about back back and neck pain. Says she feels a little better than yesterday. No events overnight.  Objective: Vital signs in last 24 hours: Temp:  [98.4 F (36.9 C)-99.6 F (37.6 C)] 98.4 F (36.9 C) (07/02 0551) Pulse Rate:  [89-113] 89 (07/02 0551) Resp:  [17-37] 19 (07/02 0551) BP: (98-148)/(43-86) 130/68 (07/02 0551) SpO2:  [91 %-97 %] 92 % (07/02 0551) Weight:  [137.6 kg (303 lb 4.8 oz)] 137.6 kg (303 lb 4.8 oz) (07/01 1150) Weight change: -2.586 kg (-5 lb 11.2 oz) Last BM Date: 08/10/16  Intake/Output from previous day: 07/01 0701 - 07/02 0700 In: 3343 [I.V.:2843; IV Piggyback:500] Out: 1575 [Urine:1575] Intake/Output this shift: No intake/output data recorded.  General appearance: no distress Head: Normocephalic, without obvious abnormality, atraumatic Resp: Shallow inspiratory efforts. Otherwise clear to auscultation. Not using a sensory muscles. Cardio: regular rate and rhythm, S1, S2 normal, no murmur, click, rub or gallop GI: soft, non-tender; bowel sounds normal; no masses,  no organomegaly Extremities: 1+ lower extremity edema Neurologic: Alert and oriented X 3, normal strength and tone. Normal symmetric reflexes. Normal coordination and gait  Lab Results:  Recent Labs  08/10/16 0606 08/11/16 0350  WBC 11.4* 11.5*  HGB 15.1 12.6  HCT 43.6 36.5  PLT 165 143*   BMET  Recent Labs  08/10/16 0606 08/11/16 0350  NA 139 139  K 3.7 3.5  CL 101 110  CO2 25 24  GLUCOSE 310* 160*  BUN 12 14  CREATININE 0.68 0.69  CALCIUM 9.5 7.8*    Studies/Results: Dg Chest Port 1 View  Result Date: 08/10/2016 CLINICAL DATA:  61 y/o  F; sepsis. EXAM: PORTABLE CHEST 1 VIEW COMPARISON:  07/03/2016 chest radiograph FINDINGS: Stable heart size and mediastinal contours are within normal limits given projection and technique. No focal consolidation, effusion, or pneumothorax. Right shoulder rotator cuff repair changes.  IMPRESSION: No acute pulmonary process identified. Electronically Signed   By: Kristine Garbe M.D.   On: 08/10/2016 06:42   Ct Renal Stone Study  Result Date: 08/10/2016 CLINICAL DATA:  Lower abdominal pain with vomiting, chills and dizziness since bedtime Saturday night, history benign essential hypertension, type II diabetes mellitus, sleep apnea EXAM: CT ABDOMEN AND PELVIS WITHOUT CONTRAST TECHNIQUE: Multidetector CT imaging of the abdomen and pelvis was performed following the standard protocol without IV contrast. Sagittal and coronal MPR images reconstructed from axial data set. Oral contrast was not administered. COMPARISON:  None FINDINGS: Lower chest: Minimal dependent atelectasis at RIGHT lung base Hepatobiliary: Liver appears enlarged. Probable hepatic fatty infiltration with focal sparing adjacent to gallbladder fossa. No additional focal hepatic or gallbladder abnormalities. No biliary dilatation. 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 Adrenal glands, kidneys, ureters, and bladder otherwise normal appearance. Stomach/Bowel: Normal appendix. Minimal sigmoid diverticulosis. Stomach and bowel loops otherwise normal appearance. Vascular/Lymphatic: Atherosclerotic calcifications aorta without aneurysm. Scattered pelvic phleboliths. Numerous though normal sized lymph nodes identified retroperitoneal, mesenteric, periportal, and LEFT external iliac. No adenopathy. Reproductive: Uterus and ovaries normal appearance Other: No free air or free fluid. No hernia or acute inflammatory process. Musculoskeletal: Scattered degenerative disc and facet disease changes of thoracolumbar spine IMPRESSION: No acute intra-abdominal or intrapelvic abnormalities. Probable hepatomegaly with fatty infiltration of liver and focal sparing adjacent to gallbladder fossa. 14 x 9 mm myelolipoma LEFT adrenal  gland. Minimal sigmoid diverticulosis. Aortic Atherosclerosis (ICD10-I70.0). Electronically Signed  By: Lavonia Dana M.D.   On: 08/10/2016 07:57    Medications: I have reviewed the patient's current medications.  Assessment/Plan: 1. Sepsis. Secondary to urinary tract infection. Patient received aggressive IV hydration in the ER and overnight on the floor. This morning no longer tachycardic. Blood pressure is stable. We'll go ahead and slow IV fluids continue to monitor patient on telemetry for now.  2. Urinary tract infection. We'll continue Zosyn and vancomycin for now. Urine cultures pending. We'll adjust antibiotics with culture results.  3. Diabetes. Going to hold her insulin. Put her on some sliding scale. 4. Depression. Continue current medications 5. Lower extremity edema. Likely secondary to aggressive IV hydration. We'll cut IV fluid rate today. Time mobilized patient later today.   total time spent 25 minutes  LOS: 1 day   Baxter Hire 08/11/2016, 7:34 AM

## 2016-08-11 NOTE — Progress Notes (Signed)
Oxygen drops in the 80's if cpap is not on.  Patient feels short of breath. Drowsy,  BP 98/46 and heart rate 81.  Order received for chest xray and oxygen PRN

## 2016-08-12 LAB — BASIC METABOLIC PANEL
Anion gap: 7 (ref 5–15)
BUN: 12 mg/dL (ref 6–20)
CHLORIDE: 108 mmol/L (ref 101–111)
CO2: 25 mmol/L (ref 22–32)
CREATININE: 0.55 mg/dL (ref 0.44–1.00)
Calcium: 7.8 mg/dL — ABNORMAL LOW (ref 8.9–10.3)
GFR calc non Af Amer: >60 mL/min (ref >60)
GLUCOSE: 172 mg/dL — AB (ref 65–99)
Potassium: 3.8 mmol/L (ref 3.5–5.1)
Sodium: 140 mmol/L (ref 135–145)

## 2016-08-12 LAB — GLUCOSE, CAPILLARY
GLUCOSE-CAPILLARY: 198 mg/dL — AB (ref 65–99)
GLUCOSE-CAPILLARY: 113 mg/dL — AB (ref 65–99)
Glucose-Capillary: 160 mg/dL — ABNORMAL HIGH (ref 65–99)
Glucose-Capillary: 172 mg/dL — ABNORMAL HIGH (ref 65–99)
Glucose-Capillary: 140 mg/dL — ABNORMAL HIGH (ref 65–99)

## 2016-08-12 LAB — CBC
HCT: 36.9 % (ref 35.0–47.0)
Hemoglobin: 12.7 g/dL (ref 12.0–16.0)
MCH: 31.8 pg (ref 26.0–34.0)
MCHC: 34.5 g/dL (ref 32.0–36.0)
MCV: 92.4 fL (ref 80.0–100.0)
PLATELETS: 138 10*3/uL — AB (ref 150–440)
RBC: 4 MIL/uL (ref 3.80–5.20)
RDW: 13.5 % (ref 11.5–14.5)
WBC: 7.1 10*3/uL (ref 3.6–11.0)

## 2016-08-12 LAB — HIV ANTIBODY (ROUTINE TESTING W REFLEX): HIV Screen 4th Generation wRfx: NONREACTIVE

## 2016-08-12 MED ORDER — GUAIFENESIN-DM 100-10 MG/5ML PO SYRP
5.0000 mL | ORAL_SOLUTION | ORAL | Status: DC | PRN
  Administered 2016-08-12 (×2): 5 mL via ORAL
  Filled 2016-08-12 (×2): qty 5

## 2016-08-12 MED ORDER — FUROSEMIDE 10 MG/ML IJ SOLN
40.0000 mg | Freq: Once | INTRAMUSCULAR | Status: AC
  Administered 2016-08-12: 40 mg via INTRAVENOUS
  Filled 2016-08-12: qty 4

## 2016-08-12 MED ORDER — FUROSEMIDE 40 MG PO TABS
40.0000 mg | ORAL_TABLET | Freq: Every day | ORAL | Status: DC
  Administered 2016-08-13 – 2016-08-15 (×3): 40 mg via ORAL
  Filled 2016-08-12 (×3): qty 1

## 2016-08-12 NOTE — Progress Notes (Signed)
Subjective: Patient says she still feels bad. Says she feels like she is aching all over. However appears to be in less distress today.  Objective: Vital signs in last 24 hours: Temp:  [97.9 F (36.6 C)-98.5 F (36.9 C)] 98.3 F (36.8 C) (07/03 0909) Pulse Rate:  [69-85] 84 (07/03 0909) Resp:  [18-20] 20 (07/03 0909) BP: (98-134)/(46-64) 133/64 (07/03 0909) SpO2:  [85 %-98 %] 96 % (07/03 0548) Weight change:  Last BM Date: 08/10/16  Intake/Output from previous day: 07/02 0701 - 07/03 0700 In: 2365 [P.O.:480; I.V.:1835; IV Piggyback:50] Out: 2200 [Urine:2200] Intake/Output this shift: No intake/output data recorded.  General appearance: no distress Head: Normocephalic, without obvious abnormality, atraumatic Resp: Bibasilar crackles. Cardio: regular rate and rhythm, S1, S2 normal, no murmur, click, rub or gallop GI: soft, non-tender; bowel sounds normal; no masses,  no organomegaly Extremities: edema 1+ lower extremity edema. Neurologic: Grossly normal  Lab Results:  Recent Labs  08/11/16 0350 08/12/16 0744  WBC 11.5* 7.1  HGB 12.6 12.7  HCT 36.5 36.9  PLT 143* 138*   BMET  Recent Labs  08/11/16 0350 08/12/16 0744  NA 139 140  K 3.5 3.8  CL 110 108  CO2 24 25  GLUCOSE 160* 172*  BUN 14 12  CREATININE 0.69 0.55  CALCIUM 7.8* 7.8*    Studies/Results: Dg Chest Port 1 View  Result Date: 08/11/2016 CLINICAL DATA:  Shortness of breath, oxygenation in the 80s today, hypertension, diabetes mellitus, hyperlipidemia EXAM: PORTABLE CHEST 1 VIEW COMPARISON:  Portable exam 1703 hours compared to 08/10/2016 FINDINGS: Enlargement of cardiac silhouette with pulmonary vascular congestion. Mild interstitial prominence suggesting mild pulmonary edema new since previous exam. No segmental consolidation, pleural effusion or pneumothorax. Osseous demineralization with suspected BILATERAL chronic rotator cuff tears. IMPRESSION: Enlargement of cardiac silhouette with pulmonary  vascular congestion. Probable mild pulmonary edema. Electronically Signed   By: Lavonia Dana M.D.   On: 08/11/2016 17:16    Medications: I have reviewed the patient's current medications.  Assessment/Plan: 1. Sepsis. Secondary to urinary tract infection. Patient received aggressive IV hydration and supportive care. Patient desatted some late yesterday afternoon. Chest x-ray showed some ulnar edema likely secondary to the aggressive IV hydration for sepsis. We gave her 1 dose of Lasix overnight. Symptoms improved but still requiring some oxygen so we'll re-dose Lasix today and put her back on her home dose Lasix and saline lock IV fluids. 2. Urinary tract infection. Blood cultures are growing out strep agalactia antibiotics have been narrowed down to Rocephin.  3. Diabetes. Going to hold her insulin. Put her on some sliding scale. 4. Depression. Continue current medications 5. Lower extremity edema. Likely secondary to aggressive IV hydration. Improved overnight after receiving Lasix. Total time spent 25 minutes . LOS: 2 days   Baxter Hire 08/12/2016, 9:30 AM

## 2016-08-13 ENCOUNTER — Inpatient Hospital Stay
Admit: 2016-08-13 | Discharge: 2016-08-13 | Disposition: A | Discharge status: Home / Self Care | Payer: 59 | Attending: Internal Medicine | Admitting: Internal Medicine

## 2016-08-13 ENCOUNTER — Inpatient Hospital Stay: Payer: 59

## 2016-08-13 LAB — CULTURE, BLOOD (ROUTINE X 2)
SPECIAL REQUESTS: ADEQUATE
Special Requests: ADEQUATE

## 2016-08-13 LAB — GLUCOSE, CAPILLARY
GLUCOSE-CAPILLARY: 134 mg/dL — AB (ref 65–99)
Glucose-Capillary: 145 mg/dL — ABNORMAL HIGH (ref 65–99)
Glucose-Capillary: 102 mg/dL — ABNORMAL HIGH (ref 65–99)
Glucose-Capillary: 153 mg/dL — ABNORMAL HIGH (ref 65–99)

## 2016-08-13 LAB — BASIC METABOLIC PANEL
ANION GAP: 7 (ref 5–15)
BUN: 9 mg/dL (ref 6–20)
CHLORIDE: 109 mmol/L (ref 101–111)
CO2: 25 mmol/L (ref 22–32)
Calcium: 8.2 mg/dL — ABNORMAL LOW (ref 8.9–10.3)
Creatinine, Ser: 0.42 mg/dL — ABNORMAL LOW (ref 0.44–1.00)
GFR calc non Af Amer: >60 mL/min (ref >60)
Glucose, Bld: 149 mg/dL — ABNORMAL HIGH (ref 65–99)
POTASSIUM: 3.5 mmol/L (ref 3.5–5.1)
Sodium: 141 mmol/L (ref 135–145)

## 2016-08-13 LAB — CBC
HCT: 38.6 % (ref 35.0–47.0)
HEMOGLOBIN: 13.3 g/dL (ref 12.0–16.0)
MCH: 31.6 pg (ref 26.0–34.0)
MCHC: 34.5 g/dL (ref 32.0–36.0)
MCV: 91.5 fL (ref 80.0–100.0)
Platelets: 139 10*3/uL — ABNORMAL LOW (ref 150–440)
RBC: 4.22 MIL/uL (ref 3.80–5.20)
RDW: 13.3 % (ref 11.5–14.5)
WBC: 5.5 10*3/uL (ref 3.6–11.0)

## 2016-08-13 LAB — MAGNESIUM
Magnesium: 1.9 mg/dL (ref 1.7–2.4)
Magnesium: 2 mg/dL (ref 1.7–2.4)

## 2016-08-13 MED ORDER — DIPHENHYDRAMINE HCL 25 MG PO CAPS: 25.0000 mg | ORAL_CAPSULE | Freq: Every evening | ORAL | Status: DC | PRN

## 2016-08-13 MED ORDER — MAGNESIUM SULFATE IN D5W 1-5 GM/100ML-% IV SOLN
1.0000 g | Freq: Once | INTRAVENOUS | Status: AC
Start: 2016-08-13 — End: 2016-08-13
  Administered 2016-08-13: 1 g via INTRAVENOUS
  Filled 2016-08-13: qty 100

## 2016-08-13 MED ORDER — MELATONIN 5 MG PO TABS
5.0000 mg | ORAL_TABLET | Freq: Every evening | ORAL | Status: DC | PRN
  Administered 2016-08-13 (×2): 5 mg via ORAL
  Filled 2016-08-13 (×3): qty 1

## 2016-08-13 MED ORDER — ALBUTEROL SULFATE (2.5 MG/3ML) 0.083% IN NEBU: 2.5000 mg | INHALATION_SOLUTION | Freq: Four times a day (QID) | RESPIRATORY_TRACT | Status: DC | PRN

## 2016-08-13 MED ORDER — FUROSEMIDE 10 MG/ML IJ SOLN
40.0000 mg | Freq: Once | INTRAMUSCULAR | Status: AC
  Administered 2016-08-13: 40 mg via INTRAVENOUS
  Filled 2016-08-13: qty 4

## 2016-08-13 MED ORDER — MAGNESIUM SULFATE 4 GM/100ML IV SOLN
4.0000 g | Freq: Once | INTRAVENOUS | Status: DC
  Filled 2016-08-13: qty 100

## 2016-08-13 NOTE — Progress Notes (Signed)
Subjective: Patient said she didn't sleep very well last night. Currently on her CPAP. Complaints of shortness of breath with deep inspiration.  Objective: Vital signs in last 24 hours: Temp:  [97.7 F (36.5 C)-98.7 F (37.1 C)] 97.7 F (36.5 C) (07/04 0510) Pulse Rate:  [64-84] 64 (07/04 0510) Resp:  [18-20] 20 (07/04 0510) BP: (110-133)/(49-64) 115/60 (07/04 0510) SpO2:  [96 %-97 %] 96 % (07/04 0510) Weight change:  Last BM Date: 08/10/16  Intake/Output from previous day: 07/03 0701 - 07/04 0700 In: 335 [P.O.:240; IV Piggyback:95] Out: 1800 [Urine:1800] Intake/Output this shift: No intake/output data recorded.  General appearance: no distress Head: Normocephalic, without obvious abnormality, atraumatic Resp: Mild basilar crackles. Cardio: regular rate and rhythm, S1, S2 normal, no murmur, click, rub or gallop GI: soft, non-tender; bowel sounds normal; no masses,  no organomegaly Extremities: Mild lower extremity edema.  Lab Results:  Recent Labs  08/12/16 0744 08/13/16 0619  WBC 7.1 5.5  HGB 12.7 13.3  HCT 36.9 38.6  PLT 138* 139*   BMET  Recent Labs  08/12/16 0744 08/13/16 0619  NA 140 141  K 3.8 3.5  CL 108 109  CO2 25 25  GLUCOSE 172* 149*  BUN 12 9  CREATININE 0.55 0.42*  CALCIUM 7.8* 8.2*    Studies/Results: Dg Chest 2 View  Result Date: 08/13/2016 CLINICAL DATA:  Shortness breath EXAM: CHEST  2 VIEW COMPARISON:  08/11/2016 FINDINGS: Cardiomegaly with vascular congestion and interstitial prominence throughout the lungs, likely interstitial edema. Small layering bilateral effusions. No acute bony abnormality. IMPRESSION: Suspect mild interstitial edema.  Small layering effusions. Electronically Signed   By: Rolm Baptise M.D.   On: 08/13/2016 07:55   Dg Chest Port 1 View  Result Date: 08/11/2016 CLINICAL DATA:  Shortness of breath, oxygenation in the 80s today, hypertension, diabetes mellitus, hyperlipidemia EXAM: PORTABLE CHEST 1 VIEW COMPARISON:   Portable exam 1703 hours compared to 08/10/2016 FINDINGS: Enlargement of cardiac silhouette with pulmonary vascular congestion. Mild interstitial prominence suggesting mild pulmonary edema new since previous exam. No segmental consolidation, pleural effusion or pneumothorax. Osseous demineralization with suspected BILATERAL chronic rotator cuff tears. IMPRESSION: Enlargement of cardiac silhouette with pulmonary vascular congestion. Probable mild pulmonary edema. Electronically Signed   By: Lavonia Dana M.D.   On: 08/11/2016 17:16    Medications: I have reviewed the patient's current medications.  Assessment/Plan: 1. Sepsis. Secondary to urinary tract infection. Resolved. Patient was treated aggressively with IV hydration. Diuresed her son yesterday. Patient still seems a little bit volume overloaded. We'll give more IV Lasix today. Chest x-ray this morning showed some point or vascular congestion but appeared improved from last x-ray. We'll also get echocardiogram.  2. Urinary tract infection. Blood cultures are growing out strep agalactia antibiotics have been narrowed down to Rocephin.  3. Diabetes. Going to hold her insulin. Put her on some sliding scale. 4. Depression. Continue current medications 5. Lower extremity edema. Improving.  LOS: 3 days   Baxter Hire 08/13/2016, 8:27 AM

## 2016-08-13 NOTE — Evaluation (Signed)
Physical Therapy Evaluation Patient Details Name: Tina Short MRN: [id number] DOB: 30-Apr-1955 Today's Date: 08/13/2016   History of Present Illness  Pt is a 61 y.o. female presenting to hospital with severe LBP (mostly R side), lower abdominal pain, vomiting, chills, and general malaise.  Pt admitted to hospital with sepsis secondary to UTI.  PMH includes depression, DM, htn, neuropathy d/t DM, and sleep apnea.  Clinical Impression  Prior to hospital admission, pt was independent and working full time (no home O2).  Pt lives with her husband in 1 level home with 3 steps to enter (L railing).  Currently pt is SBA with transfers and ambulation up to 100 feet (no assistive device).  Distance limited d/t SOB but pt's O2 sats 95% or greater on 3 L O2 via nasal cannula during session.  Pt would benefit from skilled PT to address noted impairments and functional limitations (see below for any additional details).  Upon hospital discharge, recommend pt discharge to home with support of family.    Follow Up Recommendations No PT follow up    Equipment Recommendations  None recommended by PT    Recommendations for Other Services       Precautions / Restrictions Precautions Precautions: Fall Restrictions Weight Bearing Restrictions: No      Mobility  Bed Mobility               General bed mobility comments: Deferred d/t pt sitting up in recliner beginning and end of session.  Transfers Overall transfer level: Needs assistance Equipment used: None Transfers: Sit to/from Stand Sit to Stand: Supervision         General transfer comment: steady strong stand; controlled descent sitting  Ambulation/Gait Ambulation/Gait assistance: Min guard;Supervision Ambulation Distance (Feet):  (40 feet; 100 feet in room) Assistive device: None   Gait velocity: decreased   General Gait Details: mild increased BOS, decreased step through gait pattern; initially using IV pole for support  first 20 feet but no UE support after that; steady without loss of balance  Stairs            Wheelchair Mobility    Modified Rankin (Stroke Patients Only)       Balance Overall balance assessment: Needs assistance Sitting-balance support: No upper extremity supported;Feet supported Sitting balance-Leahy Scale: Good Sitting balance - Comments: sitting reaching within BOS   Standing balance support: No upper extremity supported Standing balance-Leahy Scale: Good Standing balance comment: standing reaching within BOS                             Pertinent Vitals/Pain Pain Assessment: No/denies pain  HR WFL during session.    Home Living Family/patient expects to be discharged to:: Private residence Living Arrangements: Spouse/significant other Available Help at Discharge: Family Type of Home: House Home Access: Stairs to enter Entrance Stairs-Rails: Left Entrance Stairs-Number of Steps: 3 Home Layout: One level Home Equipment: None      Prior Function Level of Independence: Independent         Comments: Working full time (desk job).  Pt denies any falls in past 6 months.     Hand Dominance        Extremity/Trunk Assessment   Upper Extremity Assessment Upper Extremity Assessment: Generalized weakness    Lower Extremity Assessment Lower Extremity Assessment: Generalized weakness       Communication   Communication: No difficulties  Cognition Arousal/Alertness: Awake/alert Behavior During Therapy: WFL for tasks  assessed/performed Overall Cognitive Status: Within Functional Limits for tasks assessed                                        General Comments General comments (skin integrity, edema, etc.): Pt sitting up in chair with family present upon PT arrival.  Nursing cleared pt for participation in physical therapy.  Pt agreeable to PT session.    Exercises  Ambulation activity; vc's for pursed lip breathing.    Assessment/Plan    PT Assessment Patient needs continued PT services  PT Problem List Decreased strength;Decreased mobility;Cardiopulmonary status limiting activity       PT Treatment Interventions DME instruction;Gait training;Stair training;Functional mobility training;Therapeutic activities;Therapeutic exercise;Balance training;Patient/family education    PT Goals (Current goals can be found in the Care Plan section)  Acute Rehab PT Goals Patient Stated Goal: to go home PT Goal Formulation: With patient Time For Goal Achievement: 08/27/16 Potential to Achieve Goals: Good    Frequency Min 2X/week   Barriers to discharge        Co-evaluation               AM-PAC PT "6 Clicks" Daily Activity  Outcome Measure Difficulty turning over in bed (including adjusting bedclothes, sheets and blankets)?: A Little Difficulty moving from lying on back to sitting on the side of the bed? : A Little Difficulty sitting down on and standing up from a chair with arms (e.g., wheelchair, bedside commode, etc,.)?: A Little Help needed moving to and from a bed to chair (including a wheelchair)?: A Little Help needed walking in hospital room?: A Little Help needed climbing 3-5 steps with a railing? : A Little 6 Click Score: 18    End of Session Equipment Utilized During Treatment: Gait belt;Oxygen (3 L O2 via nasal cannula) Activity Tolerance: Patient tolerated treatment well Patient left: in chair;with call bell/phone within reach;with chair alarm set;with family/visitor present Nurse Communication: Mobility status;Precautions (Pt's O2 sats during session.) PT Visit Diagnosis: Muscle weakness (generalized) (M62.81);Difficulty in walking, not elsewhere classified (R26.2)    Time: 1520-1550 PT Time Calculation (min) (ACUTE ONLY): 30 min   Charges:   PT Evaluation $PT Eval Low Complexity: 1 Procedure PT Treatments $Therapeutic Activity: 8-22 mins   PT G CodesLeitha Bleak, PT 08/13/16, 4:17 PM [phone]

## 2016-08-14 ENCOUNTER — Inpatient Hospital Stay: Payer: 59

## 2016-08-14 LAB — BASIC METABOLIC PANEL
ANION GAP: UNDETERMINED (ref 5–15)
ANION GAP: 7 (ref 5–15)
BUN: UNDETERMINED mg/dL (ref 6–20)
BUN: 10 mg/dL (ref 6–20)
CALCIUM: 8.2 mg/dL — AB (ref 8.9–10.3)
CO2: UNDETERMINED mmol/L (ref 22–32)
CO2: 28 mmol/L (ref 22–32)
Calcium: UNDETERMINED mg/dL (ref 8.9–10.3)
Chloride: UNDETERMINED mmol/L (ref 101–111)
Chloride: 107 mmol/L (ref 101–111)
Creatinine, Ser: UNDETERMINED mg/dL (ref 0.44–1.00)
Creatinine, Ser: 0.38 mg/dL — ABNORMAL LOW (ref 0.44–1.00)
GFR calc non Af Amer: >60 mL/min (ref >60)
Glucose, Bld: UNDETERMINED mg/dL (ref 65–99)
Glucose, Bld: 135 mg/dL — ABNORMAL HIGH (ref 65–99)
POTASSIUM: UNDETERMINED mmol/L (ref 3.5–5.1)
Potassium: 3.4 mmol/L — ABNORMAL LOW (ref 3.5–5.1)
SODIUM: UNDETERMINED mmol/L (ref 135–145)
Sodium: 142 mmol/L (ref 135–145)

## 2016-08-14 LAB — CBC
HCT: 36.4 % (ref 35.0–47.0)
HEMATOCRIT: UNDETERMINED % (ref 35.0–47.0)
HEMOGLOBIN: UNDETERMINED g/dL (ref 12.0–16.0)
HEMOGLOBIN: 12.5 g/dL (ref 12.0–16.0)
MCH: UNDETERMINED pg (ref 26.0–34.0)
MCH: 31.4 pg (ref 26.0–34.0)
MCHC: UNDETERMINED g/dL (ref 32.0–36.0)
MCHC: 34.2 g/dL (ref 32.0–36.0)
MCV: UNDETERMINED fL (ref 80.0–100.0)
MCV: 91.8 fL (ref 80.0–100.0)
Platelets: UNDETERMINED 10*3/uL (ref 150–440)
Platelets: 158 10*3/uL (ref 150–440)
RBC: UNDETERMINED MIL/uL (ref 3.80–5.20)
RBC: 3.97 MIL/uL (ref 3.80–5.20)
RDW: UNDETERMINED % (ref 11.5–14.5)
RDW: 13.4 % (ref 11.5–14.5)
WBC: UNDETERMINED 10*3/uL (ref 3.6–11.0)
WBC: 4.4 10*3/uL (ref 3.6–11.0)

## 2016-08-14 LAB — GLUCOSE, CAPILLARY
GLUCOSE-CAPILLARY: 151 mg/dL — AB (ref 65–99)
GLUCOSE-CAPILLARY: 155 mg/dL — AB (ref 65–99)
GLUCOSE-CAPILLARY: 111 mg/dL — AB (ref 65–99)
Glucose-Capillary: 116 mg/dL — ABNORMAL HIGH (ref 65–99)

## 2016-08-14 LAB — ECHOCARDIOGRAM COMPLETE
Height: 68 in
WEIGHTICAEL: 4852.8 [oz_av]

## 2016-08-14 MED ORDER — MAGIC MOUTHWASH
15.0000 mL | Freq: Two times a day (BID) | ORAL | Status: DC
  Administered 2016-08-14 – 2016-08-15 (×3): 15 mL via ORAL
  Filled 2016-08-14 (×4): qty 15

## 2016-08-14 NOTE — Progress Notes (Signed)
Subjective: Patient feels better today. She did walk 100 feet yesterday. Had been weaned off oxygen light yesterday.  Objective: Vital signs in last 24 hours: Temp:  [97.5 F (36.4 C)-98.7 F (37.1 C)] 97.5 F (36.4 C) (07/05 0546) Pulse Rate:  [66-70] 66 (07/05 0546) Resp:  [20] 20 (07/05 0546) BP: (102-127)/(55-68) 127/56 (07/05 0546) SpO2:  [98 %-100 %] 100 % (07/05 0546) Weight change:  Last BM Date: 08/10/16  Intake/Output from previous day: 07/04 0701 - 07/05 0700 In: 410 [P.O.:360; IV Piggyback:50] Out: 600 [Urine:600] Intake/Output this shift: No intake/output data recorded.  General appearance: no distress Resp: clear to auscultation bilaterally and normal percussion bilaterally Cardio: regular rate and rhythm, S1, S2 normal, no murmur, click, rub or gallop GI: soft, non-tender; bowel sounds normal; no masses,  no organomegaly  Lab Results:  Recent Labs  08/13/16 0619 08/14/16 0650  WBC 5.5 4.4  HGB 13.3 12.5  HCT 38.6 36.4  PLT 139* 158   BMET  Recent Labs  08/13/16 0619 08/14/16 0650  NA 141 142  K 3.5 3.4*  CL 109 107  CO2 25 28  GLUCOSE 149* 135*  BUN 9 10  CREATININE 0.42* 0.38*  CALCIUM 8.2* 8.2*    Studies/Results: Dg Chest 2 View  Result Date: 08/13/2016 CLINICAL DATA:  Shortness breath EXAM: CHEST  2 VIEW COMPARISON:  08/11/2016 FINDINGS: Cardiomegaly with vascular congestion and interstitial prominence throughout the lungs, likely interstitial edema. Small layering bilateral effusions. No acute bony abnormality. IMPRESSION: Suspect mild interstitial edema.  Small layering effusions. Electronically Signed   By: Rolm Baptise M.D.   On: 08/13/2016 07:55    Medications: I have reviewed the patient's current medications.  Assessment/Plan: 1. Sepsis. Secondary to urinary tract infection. Resolved. Patient was treated aggressively with IV hydration. Was fluid overloaded from IV fluids. Will diurese her past 2 days. Patient improving has been  weaned off oxygen. 2. Urinary tract infection. Blood cultures are growing out strep agalactia antibiotics have been narrowed down to Rocephin.  3. Diabetes. Going to hold her insulin. Put her on some sliding scale. 4. Depression. Continue current medications 5. Lower extremity edema. Improving.    Total time spent 20 minutes  LOS: 4 days   Baxter Hire 08/14/2016, 7:44 AM

## 2016-08-15 LAB — BASIC METABOLIC PANEL
Anion gap: 9 (ref 5–15)
BUN: 12 mg/dL (ref 6–20)
CALCIUM: 8.5 mg/dL — AB (ref 8.9–10.3)
CO2: 29 mmol/L (ref 22–32)
CREATININE: 0.6 mg/dL (ref 0.44–1.00)
Chloride: 104 mmol/L (ref 101–111)
GFR calc Af Amer: >60 mL/min (ref >60)
GLUCOSE: 129 mg/dL — AB (ref 65–99)
POTASSIUM: 3.2 mmol/L — AB (ref 3.5–5.1)
SODIUM: 142 mmol/L (ref 135–145)

## 2016-08-15 LAB — GLUCOSE, CAPILLARY
Glucose-Capillary: 143 mg/dL — ABNORMAL HIGH (ref 65–99)
Glucose-Capillary: 191 mg/dL — ABNORMAL HIGH (ref 65–99)

## 2016-08-15 MED ORDER — AMPICILLIN 500 MG PO CAPS: 500.0000 mg | ORAL_CAPSULE | Freq: Four times a day (QID) | ORAL | 0 refills | Status: DC

## 2016-08-15 MED ORDER — POTASSIUM CHLORIDE 20 MEQ PO PACK
20.0000 meq | PACK | Freq: Once | ORAL | Status: AC
Start: 2016-08-15 — End: 2016-08-15
  Administered 2016-08-15: 20 meq via ORAL
  Filled 2016-08-15: qty 1

## 2016-08-15 NOTE — Care Management (Deleted)
Tina Short HD liaison notified of admission and discharge 

## 2016-08-15 NOTE — Progress Notes (Signed)
08/15/2016  12:25 PM  Craige Cotta Mario to be D/C'd Home per MD order.  Discussed prescriptions and follow up appointments with the patient. Prescriptions given to patient, medication list explained in detail. Pt verbalized understanding.  Allergies as of 08/15/2016      Reactions   Abilify  [aripiprazole]    Other reaction(s): Difficulty breathing   Invokana [canagliflozin] Itching   Yeast infections      Medication List    TAKE these medications   ACCU-CHEK AVIVA PLUS test strip Generic drug:  glucose blood Test once daily   allopurinol 100 MG tablet Commonly known as:  ZYLOPRIM TAKE 1 TABLET BY MOUTH TWO  TIMES DAILY   ampicillin 500 MG capsule Commonly known as:  PRINCIPEN Take 1 capsule (500 mg total) by mouth 4 (four) times daily.   atorvastatin 40 MG tablet Commonly known as:  LIPITOR TAKE 1 TABLET BY MOUTH AT  BEDTIME   Dulaglutide 1.5 MG/0.5ML Sopn Commonly known as:  TRULICITY Inject 1.5 mg into the skin once a week.   furosemide 40 MG tablet Commonly known as:  LASIX TAKE 1 TABLET BY MOUTH TWO  TIMES DAILY AS NEEDED   losartan 100 MG tablet Commonly known as:  COZAAR TAKE 1 TABLET BY MOUTH  DAILY   meclizine 25 MG tablet Commonly known as:  ANTIVERT Take 1 tablet (25 mg total) by mouth 3 (three) times daily as needed for dizziness.   metformin 1000 MG (OSM) 24 hr tablet Commonly known as:  FORTAMET TAKE 2 TABLETS BY MOUTH  DAILY WITH BREAKFAST   multivitamin capsule Take 1 capsule by mouth daily.   omega-3 acid ethyl esters 1 g capsule Commonly known as:  LOVAZA TAKE 2 CAPSULES BY MOUTH  TWO TIMES DAILY   potassium chloride SA 20 MEQ tablet Commonly known as:  K-DUR,KLOR-CON Take 1 tablet (20 mEq total) by mouth daily.   promethazine 25 MG tablet Commonly known as:  PHENERGAN Take 1 tablet (25 mg total) by mouth every 8 (eight) hours as needed for nausea or vomiting.   venlafaxine XR 150 MG 24 hr capsule Commonly known as:  EFFEXOR-XR TAKE 2  CAPSULES BY MOUTH  DAILY WITH BREAKFAST.   Vitamin D (Ergocalciferol) 50000 units Caps capsule Commonly known as:  DRISDOL TAKE 1 CAPSULE BY MOUTH  EVERY 7 DAYS       Vitals:   08/15/16 0501 08/15/16 1218  BP: 139/70 (!) 115/46  Pulse: (!) 57 74  Resp: 20   Temp: 97.8 F (36.6 C)     Skin clean, dry and intact without evidence of skin break down, no evidence of skin tears noted. IV catheter discontinued intact. Site without signs and symptoms of complications. Dressing and pressure applied. Pt denies pain at this time. No complaints noted.  An After Visit Summary was printed and given to the patient. Patient escorted via [place], and D/C home via private auto.  Tina Short

## 2016-08-15 NOTE — Progress Notes (Signed)
I attempted to place the patient on her home CPAP machine.  She stated that she would place herself on the machine later.

## 2016-08-15 NOTE — Care Management (Addendum)
Patient admitted from home with sepsis.  Lives at home with husband.  Patient is employed.  independent at baseline. Assessed by PT.  No PT follow up recommended. Patient required acute O2 while inpatient however was weaned to RA.  Patient wears CPAP at home.  PCP S[place].  No RNCM needs identified.  RNCM signing off

## 2016-08-15 NOTE — Progress Notes (Signed)
Pt on home CPAP. CPAP plugged into red outlet.  

## 2016-08-15 NOTE — Discharge Summary (Signed)
Physician Discharge Summary  Patient ID: Tina Short MRN: [id number] DOB/AGE: [birth year] 61 y.o.  Admit date: 08/10/2016 Discharge date: 08/15/2016  Admission Diagnoses:1. Sepsis 2. Urinary tract infection 3. Diabetes  Discharge Diagnoses:  Active Problems:   Sepsis ([place]) Urinary tract infection  Discharged Condition: stable  Hospital Course: 1. Sepsis. Secondary to urinary tract infection. Patient was treated treated with aggressive IV hydration and antibiotics. Patient became fluid overloaded due to IV fluids. Echocardiogram the heart showed normal cardiac function so is likely due to just IV fluids. She was diuresed and fluid overload resolved.  2. Urinary tract infection. Blood cultures are growing out strep agalactia. Antibiotics were narrowed down to IV Rocephin. At discharge she was changed to by mouth ampicillin. 3. Diabetes. Glucophage was held during hospitalization. MB restarted at discharge Discharge Exam: Blood pressure 139/70, pulse (!) 57, temperature 97.8 F (36.6 C), temperature source Oral, resp. rate 20, height 5\' 8"  (1.727 m), weight (!) 137.6 kg (303 lb 4.8 oz), SpO2 99 %. General appearance: no distress Resp: clear to auscultation bilaterally Cardio: regular rate and rhythm, S1, S2 normal, no murmur, click, rub or gallop Extremities: 1+ lower extremity edema  Disposition: 01-Home or Self Care  Discharge Instructions    Diet - low sodium heart healthy    Complete by:  As directed    Increase activity slowly    Complete by:  As directed      Allergies as of 08/15/2016      Reactions   Abilify  [aripiprazole]    Other reaction(s): Difficulty breathing   Invokana [canagliflozin] Itching   Yeast infections      Medication List    TAKE these medications   ACCU-CHEK AVIVA PLUS test strip Generic drug:  glucose blood Test once daily   allopurinol 100 MG tablet Commonly known as:  ZYLOPRIM TAKE 1 TABLET BY MOUTH TWO  TIMES DAILY   ampicillin 500 MG  capsule Commonly known as:  PRINCIPEN Take 1 capsule (500 mg total) by mouth 4 (four) times daily.   atorvastatin 40 MG tablet Commonly known as:  LIPITOR TAKE 1 TABLET BY MOUTH AT  BEDTIME   Dulaglutide 1.5 MG/0.5ML Sopn Commonly known as:  TRULICITY Inject 1.5 mg into the skin once a week.   furosemide 40 MG tablet Commonly known as:  LASIX TAKE 1 TABLET BY MOUTH TWO  TIMES DAILY AS NEEDED   losartan 100 MG tablet Commonly known as:  COZAAR TAKE 1 TABLET BY MOUTH  DAILY   meclizine 25 MG tablet Commonly known as:  ANTIVERT Take 1 tablet (25 mg total) by mouth 3 (three) times daily as needed for dizziness.   metformin 1000 MG (OSM) 24 hr tablet Commonly known as:  FORTAMET TAKE 2 TABLETS BY MOUTH  DAILY WITH BREAKFAST   multivitamin capsule Take 1 capsule by mouth daily.   omega-3 acid ethyl esters 1 g capsule Commonly known as:  LOVAZA TAKE 2 CAPSULES BY MOUTH  TWO TIMES DAILY   potassium chloride SA 20 MEQ tablet Commonly known as:  K-DUR,KLOR-CON Take 1 tablet (20 mEq total) by mouth daily.   promethazine 25 MG tablet Commonly known as:  PHENERGAN Take 1 tablet (25 mg total) by mouth every 8 (eight) hours as needed for nausea or vomiting.   venlafaxine XR 150 MG 24 hr capsule Commonly known as:  EFFEXOR-XR TAKE 2 CAPSULES BY MOUTH  DAILY WITH BREAKFAST.   Vitamin D (Ergocalciferol) 50000 units Caps capsule Commonly known as:  DRISDOL TAKE  1 CAPSULE BY MOUTH  EVERY 7 DAYS      Follow-up Information    Steele Sizer, MD. Schedule an appointment as soon as possible for a visit in 1 week(s).   Specialty:  Family Medicine Contact information: [address] [place] [address] [phone]           Signed: Baxter Hire 08/15/2016, 9:20 AM

## 2016-08-17 ENCOUNTER — Other Ambulatory Visit: Payer: Self-pay | Admitting: Family Medicine

## 2016-08-17 DIAGNOSIS — E1165 Type 2 diabetes mellitus with hyperglycemia: Principal | ICD-10-CM

## 2016-08-17 DIAGNOSIS — IMO0002 Reserved for concepts with insufficient information to code with codable children: Secondary | ICD-10-CM

## 2016-08-17 DIAGNOSIS — E1140 Type 2 diabetes mellitus with diabetic neuropathy, unspecified: Secondary | ICD-10-CM

## 2016-08-18 ENCOUNTER — Other Ambulatory Visit: Payer: Self-pay | Admitting: Family Medicine

## 2016-08-18 MED ORDER — FLUCONAZOLE 150 MG PO TABS: 150.0000 mg | ORAL_TABLET | ORAL | 0 refills | Status: DC

## 2016-08-18 NOTE — Telephone Encounter (Signed)
Patient requesting refill of Trulicity to [facility].

## 2016-08-21 ENCOUNTER — Other Ambulatory Visit: Payer: Self-pay

## 2016-08-21 DIAGNOSIS — E1140 Type 2 diabetes mellitus with diabetic neuropathy, unspecified: Secondary | ICD-10-CM

## 2016-08-21 DIAGNOSIS — IMO0002 Reserved for concepts with insufficient information to code with codable children: Secondary | ICD-10-CM

## 2016-08-21 DIAGNOSIS — E1165 Type 2 diabetes mellitus with hyperglycemia: Principal | ICD-10-CM

## 2016-08-21 NOTE — Telephone Encounter (Signed)
Patient requesting refill of Trulicity to [facility].

## 2016-08-27 ENCOUNTER — Encounter: Payer: Self-pay | Admitting: Family Medicine

## 2016-08-27 ENCOUNTER — Ambulatory Visit (INDEPENDENT_AMBULATORY_CARE_PROVIDER_SITE_OTHER): Payer: 59 | Admitting: Family Medicine

## 2016-08-27 VITALS — BP 128/74 | HR 93 | Temp 97.6°F | Resp 16 | Ht 68.0 in | Wt 287.5 lb

## 2016-08-27 DIAGNOSIS — I10 Essential (primary) hypertension: Secondary | ICD-10-CM | POA: Diagnosis not present

## 2016-08-27 DIAGNOSIS — M109 Gout, unspecified: Secondary | ICD-10-CM

## 2016-08-27 DIAGNOSIS — F339 Major depressive disorder, recurrent, unspecified: Secondary | ICD-10-CM

## 2016-08-27 DIAGNOSIS — G4733 Obstructive sleep apnea (adult) (pediatric): Secondary | ICD-10-CM | POA: Diagnosis not present

## 2016-08-27 DIAGNOSIS — E782 Mixed hyperlipidemia: Secondary | ICD-10-CM

## 2016-08-27 DIAGNOSIS — Z09 Encounter for follow-up examination after completed treatment for conditions other than malignant neoplasm: Secondary | ICD-10-CM

## 2016-08-27 DIAGNOSIS — E1165 Type 2 diabetes mellitus with hyperglycemia: Secondary | ICD-10-CM | POA: Diagnosis not present

## 2016-08-27 DIAGNOSIS — E1140 Type 2 diabetes mellitus with diabetic neuropathy, unspecified: Secondary | ICD-10-CM | POA: Diagnosis not present

## 2016-08-27 DIAGNOSIS — IMO0002 Reserved for concepts with insufficient information to code with codable children: Secondary | ICD-10-CM

## 2016-08-27 MED ORDER — LOSARTAN POTASSIUM 100 MG PO TABS: 100.0000 mg | ORAL_TABLET | Freq: Every day | ORAL | 1 refills | Status: DC

## 2016-08-27 MED ORDER — DULAGLUTIDE 1.5 MG/0.5ML ~~LOC~~ SOAJ: 1.5000 mg | SUBCUTANEOUS | 1 refills | Status: DC

## 2016-08-27 MED ORDER — ATORVASTATIN CALCIUM 40 MG PO TABS: 40.0000 mg | ORAL_TABLET | Freq: Every day | ORAL | 1 refills | Status: DC

## 2016-08-27 MED ORDER — ALLOPURINOL 100 MG PO TABS: 100.0000 mg | ORAL_TABLET | Freq: Two times a day (BID) | ORAL | 1 refills | Status: DC

## 2016-08-27 MED ORDER — ASPIRIN EC 81 MG PO TBEC: 81.0000 mg | DELAYED_RELEASE_TABLET | Freq: Every day | ORAL | 0 refills | Status: DC

## 2016-08-27 NOTE — Progress Notes (Addendum)
Name: Tina Short   MRN: [id number]    DOB: 07/25/1955   Date:08/27/2016       Progress Note  Subjective  Chief Complaint  Chief Complaint  Patient presents with  . Hospitalization Follow-up    When to the ER due to chills, vomiting and back pain. Was diagnosed with Sepsis UTI and given antibiotics and fluids. Needs FMLA paperwork filled out for missed time from work.     HPI  Hospital follow up: she went to bed on 07/02 feeling well, and woke up at 2 am with chills, back pain  and vomiting, she went to [facility] and diagnosed with UTI/sepsis, she was admitted for 4 days, she spent another 6 days at home prior to going back to work. She finished ampicillin and states initially had diarrhea but stools are back to normal, still feeling tired and has poor appetite. No fever, chills, dysuria since discharge, back to work since 08/21/2016  DMII: she has a history of DM for many years, seen by Endocrinologist in the past. Last hgbA1C was up to 7.6% She was onTrulicity and Fortamet ( since 10/2015 ), but has been off Trulicity since hospital stay, but advised that she may need to resume after we get lab results. She still losing weight, more since hospital stay, . She denies polyphagia,  polydipsia and polyuria. She takes statin, Losartan ( ARB) taking aspirin sometimes ,but has been off recently  She has neuropathy, but is doing better since glucose is at goal  Morbid obesity: she has a long history of obesity, she was started on Trulicity in 28/3151 and is responding well, she is down  15 lobs for the past 2 weeks, since hospital stay  Major Depression: Her father committed suicide in his 19's and there are other family members with history of psychiatric illness. 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 She states that she is doing her part in trying to do things even when she does not feel like it. She has not missed  any work. She has not been shopping or cleaning her house, she doe not get out of  the house for pleasure.We increased Effexor to 300 mg daily in September but she has not noticed much of a change. Symptoms worse during Winter months, and had a set back because of recent admission to [facility] for UTI and sepsis. She states her boss knows she has depression. She states she gets distracted when at work, but feels down and tired when she gets home, she states she wakes up feeling heavy and tired. She feels guilty because the best part of her is at work.  Discussed referral to psychiatrist and she wants to go now. Discussed sun exposure, try exercise a little, mindfulness  She does not like self help things - annoys her.  OSA: she uses CPAP every night. She had a repeat study, compliant with medication. She states that if she does not use CPAP she feels shaky and has a headache when she wakes up  B12 deficiency: she has been getting B12, but not monthly   Gout: no recent episodes, she is now on Allopurinol twice daily and denies side effects of medication   Patient Active Problem List   Diagnosis Date Noted  . Sepsis ([place]) 08/10/2016  . Encounter for screening colonoscopy 04/30/2016  . Major depression, recurrent, chronic ([place]) 10/30/2015  . Dyslipidemia associated with  type 2 diabetes mellitus ([place]) 10/30/2015  . Vitamin D deficiency 07/27/2015  . Vitamin B12 deficiency 07/27/2015  . History of anemia 09/12/2014  . Carpal tunnel syndrome 09/12/2014  . Obstructive sleep apnea 09/12/2014  . Edema extremities 09/12/2014  . Gout 09/12/2014  . Extreme obesity 11/15/2009  . Benign essential HTN 10/07/2006  . Type 2 diabetes mellitus, uncontrolled, with neuropathy ([place]) 10/07/2006  . Hyperlipidemia 10/07/2006    Past Surgical History:  Procedure Laterality Date  . ABDOMINAL HYSTERECTOMY    . COLONOSCOPY    . COLONOSCOPY WITH PROPOFOL N/A 05/14/2016   Procedure: COLONOSCOPY WITH PROPOFOL;   Surgeon: [name], MD;  Location: [facility] ENDOSCOPY;  Service: Endoscopy;  Laterality: N/A;  . FOOT SURGERY Right    X2  . SHOULDER SURGERY Right   . WRIST SURGERY Bilateral     Family History  Problem Relation Age of Onset  . Diabetes Mother   . Cancer Mother        uterine  . Mental illness Father   . Obesity Daughter   . Diabetes Maternal Grandmother   . Heart disease Brother   . Obesity Daughter     Social History   Social History  . Marital status: Married    Spouse name: N/A  . Number of children: N/A  . Years of education: N/A   Occupational History  . Not on file.   Social History Main Topics  . Smoking status: Never Smoker  . Smokeless tobacco: Never Used  . Alcohol use No  . Drug use: No  . Sexual activity: Yes    Partners: Male   Other Topics Concern  . Not on file   Social History Narrative  . No narrative on file     Current Outpatient Prescriptions:  .  ACCU-CHEK AVIVA PLUS test strip, Test once daily, Disp: 100 each, Rfl: 11 .  allopurinol (ZYLOPRIM) 100 MG tablet, Take 1 tablet (100 mg total) by mouth 2 (two) times daily., Disp: 180 tablet, Rfl: 1 .  atorvastatin (LIPITOR) 40 MG tablet, Take 1 tablet (40 mg total) by mouth at bedtime., Disp: 90 tablet, Rfl: 1 .  furosemide (LASIX) 40 MG tablet, TAKE 1 TABLET BY MOUTH TWO  TIMES DAILY AS NEEDED, Disp: 180 tablet, Rfl: 0 .  losartan (COZAAR) 100 MG tablet, Take 1 tablet (100 mg total) by mouth daily., Disp: 90 tablet, Rfl: 1 .  metformin (FORTAMET) 1000 MG (OSM) 24 hr tablet, TAKE 2 TABLETS BY MOUTH  DAILY WITH BREAKFAST (Patient taking differently: Take 2,000 mg by mouth daily with breakfast. TAKE 2 TABLETS BY MOUTH  DAILY WITH BREAKFAST), Disp: 180 tablet, Rfl: 1 .  Multiple Vitamin (MULTIVITAMIN) capsule, Take 1 capsule by mouth daily., Disp: , Rfl:  .  omega-3 acid ethyl esters (LOVAZA) 1 g capsule, TAKE 2 CAPSULES BY MOUTH  TWO TIMES DAILY, Disp: 360 capsule, Rfl: 1 .  potassium chloride  SA (K-DUR,KLOR-CON) 20 MEQ tablet, Take 1 tablet (20 mEq total) by mouth daily., Disp: 90 tablet, Rfl: 1 .  venlafaxine XR (EFFEXOR-XR) 150 MG 24 hr capsule, TAKE 2 CAPSULES BY MOUTH  DAILY WITH BREAKFAST., Disp: 120 capsule, Rfl: 0 .  Vitamin D, Ergocalciferol, (DRISDOL) 50000 units CAPS capsule, TAKE 1 CAPSULE BY MOUTH  EVERY 7 DAYS, Disp: 12 capsule, Rfl: 1 .  Dulaglutide (TRULICITY) 1.5 BT/5.9RC SOPN, Inject 1.5 mg into the skin once a week., Disp: 12 pen, Rfl: 1  Allergies  Allergen Reactions  . Abilify  [Aripiprazole]  Other reaction(s): Difficulty breathing  . Invokana [Canagliflozin] Itching    Yeast infections     ROS  Constitutional: Negative for fever , positive for weight change.  Respiratory: Negative for cough and shortness of breath.   Cardiovascular: Negative for chest pain or palpitations.  Gastrointestinal: Negative for abdominal pain, no bowel changes.  Musculoskeletal: Negative for gait problem or joint swelling.  Skin: Negative for rash.  Neurological: Negative for dizziness or headache.  No other specific complaints in a complete review of systems (except as listed in HPI above).  Objective  Vitals:   08/27/16 0851  BP: 128/74  Pulse: 93  Resp: 16  Temp: 97.6 F (36.4 C)  TempSrc: Oral  SpO2: 95%  Weight: 287 lb 8 oz (130.4 kg)  Height: '5\' 8"'  (1.727 m)    Body mass index is 43.71 kg/m.  Physical Exam  Constitutional: Patient appears well-developed and well-nourished. Obese No distress.  HEENT: head atraumatic, normocephalic, pupils equal and reactive to light,  neck supple, throat within normal limits Cardiovascular: Normal rate, regular rhythm and normal heart sounds.  No murmur heard. no pitting BLE edema. Pulmonary/Chest: Effort normal and breath sounds normal. No respiratory distress. Abdominal: Soft.  There is no tenderness. Psychiatric: Patient has a depressed  mood and affect. behavior is normal. Judgment and thought content  normal.   Recent Results (from the past 2160 hour(s))  CBC with Differential/Platelet     Status: None   Collection Time: 07/03/16 12:33 PM  Result Value Ref Range   WBC 7.6 3.4 - 10.8 x10E3/uL   RBC 4.82 3.77 - 5.28 x10E6/uL   Hemoglobin 15.3 11.1 - 15.9 g/dL   Hematocrit 45.9 34.0 - 46.6 %   MCV 95 79 - 97 fL   MCH 31.7 26.6 - 33.0 pg   MCHC 33.3 31.5 - 35.7 g/dL   RDW 14.0 12.3 - 15.4 %   Platelets 217 150 - 379 x10E3/uL   Neutrophils 63 Not Estab. %   Lymphs 30 Not Estab. %   Monocytes 6 Not Estab. %   Eos 1 Not Estab. %   Basos 0 Not Estab. %   Neutrophils Absolute 4.7 1.4 - 7.0 x10E3/uL   Lymphocytes Absolute 2.3 0.7 - 3.1 x10E3/uL   Monocytes Absolute 0.5 0.1 - 0.9 x10E3/uL   EOS (ABSOLUTE) 0.1 0.0 - 0.4 x10E3/uL   Basophils Absolute 0.0 0.0 - 0.2 x10E3/uL   Immature Granulocytes 0 Not Estab. %   Immature Grans (Abs) 0.0 0.0 - 0.1 x10E3/uL  Comprehensive metabolic panel     Status: Abnormal   Collection Time: 07/03/16 12:33 PM  Result Value Ref Range   Glucose 172 (H) 65 - 99 mg/dL   BUN 10 8 - 27 mg/dL   Creatinine, Ser 0.56 (L) 0.57 - 1.00 mg/dL   GFR calc non Af Amer 100 >59 mL/min/1.73   GFR calc Af Amer 116 >59 mL/min/1.73   BUN/Creatinine Ratio 18 12 - 28   Sodium 145 (H) 134 - 144 mmol/L   Potassium 4.5 3.5 - 5.2 mmol/L   Chloride 101 96 - 106 mmol/L   CO2 25 18 - 29 mmol/L   Calcium 10.2 8.7 - 10.3 mg/dL   Total Protein 7.6 6.0 - 8.5 g/dL   Albumin 4.5 3.6 - 4.8 g/dL   Globulin, Total 3.1 1.5 - 4.5 g/dL   Albumin/Globulin Ratio 1.5 1.2 - 2.2   Bilirubin Total 0.7 0.0 - 1.2 mg/dL   Alkaline Phosphatase 66 39 - 117 IU/L  AST 55 (H) 0 - 40 IU/L   ALT 48 (H) 0 - 32 IU/L  Lipase, blood     Status: None   Collection Time: 08/10/16  6:06 AM  Result Value Ref Range   Lipase 29 11 - 51 U/L  Comprehensive metabolic panel     Status: Abnormal   Collection Time: 08/10/16  6:06 AM  Result Value Ref Range   Sodium 139 135 - 145 mmol/L   Potassium 3.7 3.5 -  5.1 mmol/L   Chloride 101 101 - 111 mmol/L   CO2 25 22 - 32 mmol/L   Glucose, Bld 310 (H) 65 - 99 mg/dL   BUN 12 6 - 20 mg/dL   Creatinine, Ser 0.68 0.44 - 1.00 mg/dL   Calcium 9.5 8.9 - 10.3 mg/dL   Total Protein 7.6 6.5 - 8.1 g/dL   Albumin 4.0 3.5 - 5.0 g/dL   AST 57 (H) 15 - 41 U/L   ALT 36 14 - 54 U/L   Alkaline Phosphatase 69 38 - 126 U/L   Total Bilirubin 1.0 0.3 - 1.2 mg/dL   GFR calc non Af Amer >60 >60 mL/min   GFR calc Af Amer >60 >60 mL/min    Comment: (NOTE) The eGFR has been calculated using the CKD EPI equation. This calculation has not been validated in all clinical situations. eGFR's persistently <60 mL/min signify possible Chronic Kidney Disease.    Anion gap 13 5 - 15  Magnesium     Status: Abnormal   Collection Time: 08/10/16  6:06 AM  Result Value Ref Range   Magnesium 1.2 (L) 1.7 - 2.4 mg/dL  CBC with Differential/Platelet     Status: Abnormal   Collection Time: 08/10/16  6:06 AM  Result Value Ref Range   WBC 11.4 (H) 3.6 - 11.0 K/uL   RBC 4.79 3.80 - 5.20 MIL/uL   Hemoglobin 15.1 12.0 - 16.0 g/dL   HCT 43.6 35.0 - 47.0 %   MCV 91.1 80.0 - 100.0 fL   MCH 31.5 26.0 - 34.0 pg   MCHC 34.5 32.0 - 36.0 g/dL   RDW 13.0 11.5 - 14.5 %   Platelets 165 150 - 440 K/uL   Neutrophils Relative % 92 %   Neutro Abs 10.4 (H) 1.4 - 6.5 K/uL   Lymphocytes Relative 5 %   Lymphs Abs 0.6 (L) 1.0 - 3.6 K/uL   Monocytes Relative 3 %   Monocytes Absolute 0.3 0.2 - 0.9 K/uL   Eosinophils Relative 0 %   Eosinophils Absolute 0.0 0 - 0.7 K/uL   Basophils Relative 0 %   Basophils Absolute 0.0 0 - 0.1 K/uL  Troponin I     Status: None   Collection Time: 08/10/16  6:06 AM  Result Value Ref Range   Troponin I <0.03 <0.03 ng/mL  Procalcitonin     Status: None   Collection Time: 08/10/16  6:06 AM  Result Value Ref Range   Procalcitonin 0.34 ng/mL    Comment:        Interpretation: PCT (Procalcitonin) <= 0.5 ng/mL: Systemic infection (sepsis) is not likely. Local  bacterial infection is possible. (NOTE)         ICU PCT Algorithm               Non ICU PCT Algorithm    ----------------------------     ------------------------------         PCT < 0.25 ng/mL  PCT < 0.1 ng/mL     Stopping of antibiotics            Stopping of antibiotics       strongly encouraged.               strongly encouraged.    ----------------------------     ------------------------------       PCT level decrease by               PCT < 0.25 ng/mL       >= 80% from peak PCT       OR PCT 0.25 - 0.5 ng/mL          Stopping of antibiotics                                             encouraged.     Stopping of antibiotics           encouraged.    ----------------------------     ------------------------------       PCT level decrease by              PCT >= 0.25 ng/mL       < 80% from peak PCT        AND PCT >= 0.5 ng/mL            Continuin g antibiotics                                              encouraged.       Continuing antibiotics            encouraged.    ----------------------------     ------------------------------     PCT level increase compared          PCT > 0.5 ng/mL         with peak PCT AND          PCT >= 0.5 ng/mL             Escalation of antibiotics                                          strongly encouraged.      Escalation of antibiotics        strongly encouraged.   Protime-INR     Status: None   Collection Time: 08/10/16  6:06 AM  Result Value Ref Range   Prothrombin Time 14.2 11.4 - 15.2 seconds   INR 1.10   Lactic acid, plasma     Status: Abnormal   Collection Time: 08/10/16  6:30 AM  Result Value Ref Range   Lactic Acid, Venous 4.5 (HH) 0.5 - 1.9 mmol/L    Comment: CRITICAL RESULT CALLED TO, READ BACK BY AND VERIFIED WITH STEPHANIE RUDD ON 08/10/16 AT 0703 BY [facility]   Blood Culture (routine x 2)     Status: Abnormal   Collection Time: 08/10/16  6:30 AM  Result Value Ref Range   Specimen Description BLOOD LEFT FOREARM     Special Requests      BOTTLES DRAWN AEROBIC AND ANAEROBIC Blood Culture adequate volume  Culture  Setup Time      GRAM POSITIVE COCCI IN BOTH AEROBIC AND ANAEROBIC BOTTLES CRITICAL RESULT CALLED TO, READ BACK BY AND VERIFIED WITH: JASON ROBBINS ON 08/10/16 AT 1914 QSD    Culture GROUP B STREP(S.AGALACTIAE)ISOLATED (A)    Report Status 08/13/2016 FINAL    Organism ID, Bacteria GROUP B STREP(S.AGALACTIAE)ISOLATED       Susceptibility   Group b strep(s.agalactiae)isolated - MIC*    CLINDAMYCIN <=0.25 SENSITIVE Sensitive     AMPICILLIN <=0.25 SENSITIVE Sensitive     ERYTHROMYCIN 2 RESISTANT Resistant     VANCOMYCIN 0.5 SENSITIVE Sensitive     CEFTRIAXONE <=0.12 SENSITIVE Sensitive     LEVOFLOXACIN 1 SENSITIVE Sensitive     * GROUP B STREP(S.AGALACTIAE)ISOLATED  Blood Culture (routine x 2)     Status: Abnormal   Collection Time: 08/10/16  6:30 AM  Result Value Ref Range   Specimen Description BLOOD LEFT WRIST    Special Requests      BOTTLES DRAWN AEROBIC AND ANAEROBIC Blood Culture adequate volume   Culture  Setup Time      GRAM POSITIVE COCCI IN BOTH AEROBIC AND ANAEROBIC BOTTLES CRITICAL RESULT CALLED TO, READ BACK BY AND VERIFIED WITH: JASON ROBBINS ON 08/10/16 AT 1914 QSD    Culture (A)     GROUP B STREP(S.AGALACTIAE)ISOLATED SUSCEPTIBILITIES PERFORMED ON PREVIOUS CULTURE WITHIN THE LAST 5 DAYS. Performed at [facility], [place] [address] [place], [place] [address]    Report Status 08/13/2016 FINAL   Blood Culture ID Panel (Reflexed)     Status: Abnormal   Collection Time: 08/10/16  6:30 AM  Result Value Ref Range   Enterococcus species NOT DETECTED NOT DETECTED   Listeria monocytogenes NOT DETECTED NOT DETECTED   Staphylococcus species NOT DETECTED NOT DETECTED   Staphylococcus aureus NOT DETECTED NOT DETECTED   Streptococcus species DETECTED (A) NOT DETECTED    Comment: CRITICAL RESULT CALLED TO, READ BACK BY AND VERIFIED WITH: JASON ROBBINS ON 08/10/16 AT 1914 QSD     Streptococcus agalactiae DETECTED (A) NOT DETECTED    Comment: CRITICAL RESULT CALLED TO, READ BACK BY AND VERIFIED WITH: JASON ROBBINS ON 08/10/16 AT 1914 QSD    Streptococcus pneumoniae NOT DETECTED NOT DETECTED   Streptococcus pyogenes NOT DETECTED NOT DETECTED   Acinetobacter baumannii NOT DETECTED NOT DETECTED   Enterobacteriaceae species NOT DETECTED NOT DETECTED   Enterobacter cloacae complex NOT DETECTED NOT DETECTED   Escherichia coli NOT DETECTED NOT DETECTED   Klebsiella oxytoca NOT DETECTED NOT DETECTED   Klebsiella pneumoniae NOT DETECTED NOT DETECTED   Proteus species NOT DETECTED NOT DETECTED   Serratia marcescens NOT DETECTED NOT DETECTED   Haemophilus influenzae NOT DETECTED NOT DETECTED   Neisseria meningitidis NOT DETECTED NOT DETECTED   Pseudomonas aeruginosa NOT DETECTED NOT DETECTED   Candida albicans NOT DETECTED NOT DETECTED   Candida glabrata NOT DETECTED NOT DETECTED   Candida krusei NOT DETECTED NOT DETECTED   Candida parapsilosis NOT DETECTED NOT DETECTED   Candida tropicalis NOT DETECTED NOT DETECTED  Urinalysis, Complete w Microscopic     Status: Abnormal   Collection Time: 08/10/16  8:52 AM  Result Value Ref Range   Color, Urine YELLOW (A) YELLOW   APPearance HAZY (A) CLEAR   Specific Gravity, Urine 1.020 1.005 - 1.030   pH 5.0 5.0 - 8.0   Glucose, UA 50 (A) NEGATIVE mg/dL   Hgb urine dipstick NEGATIVE NEGATIVE   Bilirubin Urine NEGATIVE NEGATIVE   Ketones, ur  NEGATIVE NEGATIVE mg/dL   Protein, ur NEGATIVE NEGATIVE mg/dL   Nitrite POSITIVE (A) NEGATIVE   Leukocytes, UA NEGATIVE NEGATIVE   RBC / HPF 0-5 0 - 5 RBC/hpf   WBC, UA 0-5 0 - 5 WBC/hpf   Bacteria, UA RARE (A) NONE SEEN   Squamous Epithelial / LPF 0-5 (A) NONE SEEN  Urine culture     Status: Abnormal   Collection Time: 08/10/16  8:52 AM  Result Value Ref Range   Specimen Description URINE, CATHETERIZED    Special Requests NONE    Culture (A)     <10,000 COLONIES/mL INSIGNIFICANT  GROWTH Performed at [facility], [place] [address] [place], [place] [address]    Report Status 08/11/2016 FINAL   Lactic acid, plasma     Status: Abnormal   Collection Time: 08/10/16  9:56 AM  Result Value Ref Range   Lactic Acid, Venous 5.1 (HH) 0.5 - 1.9 mmol/L    Comment: CRITICAL RESULT CALLED TO, READ BACK BY AND VERIFIED WITH KIM MAIN ON 08/10/16 AT 1040 BY [facility]   Glucose, capillary     Status: Abnormal   Collection Time: 08/10/16 11:56 AM  Result Value Ref Range   Glucose-Capillary 227 (H) 65 - 99 mg/dL  Lactic acid, plasma     Status: Abnormal   Collection Time: 08/10/16  3:11 PM  Result Value Ref Range   Lactic Acid, Venous 4.6 (HH) 0.5 - 1.9 mmol/L    Comment: CRITICAL RESULT CALLED TO, READ BACK BY AND VERIFIED WITH LAUREN WHITTENBROOK AT 6045 ON 08/10/2016 JJB   Glucose, capillary     Status: Abnormal   Collection Time: 08/10/16  4:44 PM  Result Value Ref Range   Glucose-Capillary 219 (H) 65 - 99 mg/dL  Glucose, capillary     Status: Abnormal   Collection Time: 08/10/16  9:03 PM  Result Value Ref Range   Glucose-Capillary 180 (H) 65 - 99 mg/dL  HIV antibody     Status: None   Collection Time: 08/11/16  3:50 AM  Result Value Ref Range   HIV Screen 4th Generation wRfx Non Reactive Non Reactive    Comment: (NOTE) Performed At: [facility] [place], [place] [id number] Lindon Romp MD NW:[id number]   Basic metabolic panel     Status: Abnormal   Collection Time: 08/11/16  3:50 AM  Result Value Ref Range   Sodium 139 135 - 145 mmol/L   Potassium 3.5 3.5 - 5.1 mmol/L   Chloride 110 101 - 111 mmol/L   CO2 24 22 - 32 mmol/L   Glucose, Bld 160 (H) 65 - 99 mg/dL   BUN 14 6 - 20 mg/dL   Creatinine, Ser 0.69 0.44 - 1.00 mg/dL   Calcium 7.8 (L) 8.9 - 10.3 mg/dL   GFR calc non Af Amer >60 >60 mL/min   GFR calc Af Amer >60 >60 mL/min    Comment: (NOTE) The eGFR has been calculated using the CKD EPI equation. This calculation has not been  validated in all clinical situations. eGFR's persistently <60 mL/min signify possible Chronic Kidney Disease.    Anion gap 5 5 - 15  CBC     Status: Abnormal   Collection Time: 08/11/16  3:50 AM  Result Value Ref Range   WBC 11.5 (H) 3.6 - 11.0 K/uL   RBC 3.98 3.80 - 5.20 MIL/uL   Hemoglobin 12.6 12.0 - 16.0 g/dL   HCT 36.5 35.0 - 47.0 %   MCV 91.8  80.0 - 100.0 fL   MCH 31.7 26.0 - 34.0 pg   MCHC 34.5 32.0 - 36.0 g/dL   RDW 13.4 11.5 - 14.5 %   Platelets 143 (L) 150 - 440 K/uL  Glucose, capillary     Status: Abnormal   Collection Time: 08/11/16  7:38 AM  Result Value Ref Range   Glucose-Capillary 150 (H) 65 - 99 mg/dL  Glucose, capillary     Status: Abnormal   Collection Time: 08/11/16 11:46 AM  Result Value Ref Range   Glucose-Capillary 164 (H) 65 - 99 mg/dL  Glucose, capillary     Status: Abnormal   Collection Time: 08/11/16  4:49 PM  Result Value Ref Range   Glucose-Capillary 137 (H) 65 - 99 mg/dL  Glucose, capillary     Status: Abnormal   Collection Time: 08/11/16  9:16 PM  Result Value Ref Range   Glucose-Capillary 142 (H) 65 - 99 mg/dL  Basic metabolic panel     Status: Abnormal   Collection Time: 08/12/16  7:44 AM  Result Value Ref Range   Sodium 140 135 - 145 mmol/L   Potassium 3.8 3.5 - 5.1 mmol/L   Chloride 108 101 - 111 mmol/L   CO2 25 22 - 32 mmol/L   Glucose, Bld 172 (H) 65 - 99 mg/dL   BUN 12 6 - 20 mg/dL   Creatinine, Ser 0.55 0.44 - 1.00 mg/dL   Calcium 7.8 (L) 8.9 - 10.3 mg/dL   GFR calc non Af Amer >60 >60 mL/min   GFR calc Af Amer >60 >60 mL/min    Comment: (NOTE) The eGFR has been calculated using the CKD EPI equation. This calculation has not been validated in all clinical situations. eGFR's persistently <60 mL/min signify possible Chronic Kidney Disease.    Anion gap 7 5 - 15  CBC     Status: Abnormal   Collection Time: 08/12/16  7:44 AM  Result Value Ref Range   WBC 7.1 3.6 - 11.0 K/uL   RBC 4.00 3.80 - 5.20 MIL/uL   Hemoglobin 12.7  12.0 - 16.0 g/dL   HCT 36.9 35.0 - 47.0 %   MCV 92.4 80.0 - 100.0 fL   MCH 31.8 26.0 - 34.0 pg   MCHC 34.5 32.0 - 36.0 g/dL   RDW 13.5 11.5 - 14.5 %   Platelets 138 (L) 150 - 440 K/uL  Glucose, capillary     Status: Abnormal   Collection Time: 08/12/16  7:57 AM  Result Value Ref Range   Glucose-Capillary 160 (H) 65 - 99 mg/dL   Comment 1 Notify RN   Glucose, capillary     Status: Abnormal   Collection Time: 08/12/16  9:08 AM  Result Value Ref Range   Glucose-Capillary 172 (H) 65 - 99 mg/dL  Glucose, capillary     Status: Abnormal   Collection Time: 08/12/16 11:39 AM  Result Value Ref Range   Glucose-Capillary 198 (H) 65 - 99 mg/dL   Comment 1 Notify RN   Glucose, capillary     Status: Abnormal   Collection Time: 08/12/16  4:37 PM  Result Value Ref Range   Glucose-Capillary 140 (H) 65 - 99 mg/dL   Comment 1 Notify RN   Glucose, capillary     Status: Abnormal   Collection Time: 08/12/16  9:15 PM  Result Value Ref Range   Glucose-Capillary 113 (H) 65 - 99 mg/dL  Magnesium     Status: None   Collection Time: 08/13/16  1:07 AM  Result Value Ref Range   Magnesium 1.9 1.7 - 2.4 mg/dL  Basic metabolic panel     Status: Abnormal   Collection Time: 08/13/16  6:19 AM  Result Value Ref Range   Sodium 141 135 - 145 mmol/L   Potassium 3.5 3.5 - 5.1 mmol/L   Chloride 109 101 - 111 mmol/L   CO2 25 22 - 32 mmol/L   Glucose, Bld 149 (H) 65 - 99 mg/dL   BUN 9 6 - 20 mg/dL   Creatinine, Ser 0.42 (L) 0.44 - 1.00 mg/dL   Calcium 8.2 (L) 8.9 - 10.3 mg/dL   GFR calc non Af Amer >60 >60 mL/min   GFR calc Af Amer >60 >60 mL/min    Comment: (NOTE) The eGFR has been calculated using the CKD EPI equation. This calculation has not been validated in all clinical situations. eGFR's persistently <60 mL/min signify possible Chronic Kidney Disease.    Anion gap 7 5 - 15  CBC     Status: Abnormal   Collection Time: 08/13/16  6:19 AM  Result Value Ref Range   WBC 5.5 3.6 - 11.0 K/uL   RBC 4.22  3.80 - 5.20 MIL/uL   Hemoglobin 13.3 12.0 - 16.0 g/dL   HCT 38.6 35.0 - 47.0 %   MCV 91.5 80.0 - 100.0 fL   MCH 31.6 26.0 - 34.0 pg   MCHC 34.5 32.0 - 36.0 g/dL   RDW 13.3 11.5 - 14.5 %   Platelets 139 (L) 150 - 440 K/uL  Magnesium     Status: None   Collection Time: 08/13/16  6:19 AM  Result Value Ref Range   Magnesium 2.0 1.7 - 2.4 mg/dL  Glucose, capillary     Status: Abnormal   Collection Time: 08/13/16  7:47 AM  Result Value Ref Range   Glucose-Capillary 134 (H) 65 - 99 mg/dL  Glucose, capillary     Status: Abnormal   Collection Time: 08/13/16 11:50 AM  Result Value Ref Range   Glucose-Capillary 145 (H) 65 - 99 mg/dL  ECHOCARDIOGRAM COMPLETE     Status: None   Collection Time: 08/13/16  2:13 PM  Result Value Ref Range   Weight 4,852.8 oz   Height 68 in   BP 102/55 mmHg  Glucose, capillary     Status: Abnormal   Collection Time: 08/13/16  4:53 PM  Result Value Ref Range   Glucose-Capillary 102 (H) 65 - 99 mg/dL  Glucose, capillary     Status: Abnormal   Collection Time: 08/13/16  8:26 PM  Result Value Ref Range   Glucose-Capillary 153 (H) 65 - 99 mg/dL  Basic metabolic panel     Status: None   Collection Time: 08/14/16  5:00 AM  Result Value Ref Range   Sodium QUANTITY NOT SUFFICIENT, UNABLE TO PERFORM TEST 135 - 145 mmol/L   Potassium QUANTITY NOT SUFFICIENT, UNABLE TO PERFORM TEST 3.5 - 5.1 mmol/L   Chloride QUANTITY NOT SUFFICIENT, UNABLE TO PERFORM TEST 101 - 111 mmol/L   CO2 QUANTITY NOT SUFFICIENT, UNABLE TO PERFORM TEST 22 - 32 mmol/L   Glucose, Bld QUANTITY NOT SUFFICIENT, UNABLE TO PERFORM TEST 65 - 99 mg/dL   BUN QUANTITY NOT SUFFICIENT, UNABLE TO PERFORM TEST 6 - 20 mg/dL   Creatinine, Ser QUANTITY NOT SUFFICIENT, UNABLE TO PERFORM TEST 0.44 - 1.00 mg/dL   Calcium QUANTITY NOT SUFFICIENT, UNABLE TO PERFORM TEST 8.9 - 10.3 mg/dL   GFR calc non Af Amer NOT CALCULATED >60 mL/min  GFR calc Af Amer NOT CALCULATED >60 mL/min    Comment: (NOTE) The eGFR has  been calculated using the CKD EPI equation. This calculation has not been validated in all clinical situations. eGFR's persistently <60 mL/min signify possible Chronic Kidney Disease.    Anion gap QUANTITY NOT SUFFICIENT, UNABLE TO PERFORM TEST 5 - 15  CBC     Status: None   Collection Time: 08/14/16  5:00 AM  Result Value Ref Range   WBC QUANTITY NOT SUFFICIENT, UNABLE TO PERFORM TEST 3.6 - 11.0 K/uL   RBC QUANTITY NOT SUFFICIENT, UNABLE TO PERFORM TEST 3.80 - 5.20 MIL/uL   Hemoglobin QUANTITY NOT SUFFICIENT, UNABLE TO PERFORM TEST 12.0 - 16.0 g/dL   HCT QUANTITY NOT SUFFICIENT, UNABLE TO PERFORM TEST 35.0 - 47.0 %   MCV QUANTITY NOT SUFFICIENT, UNABLE TO PERFORM TEST 80.0 - 100.0 fL   MCH QUANTITY NOT SUFFICIENT, UNABLE TO PERFORM TEST 26.0 - 34.0 pg   MCHC QUANTITY NOT SUFFICIENT, UNABLE TO PERFORM TEST 32.0 - 36.0 g/dL   RDW QUANTITY NOT SUFFICIENT, UNABLE TO PERFORM TEST 11.5 - 14.5 %   Platelets QUANTITY NOT SUFFICIENT, UNABLE TO PERFORM TEST 150 - 440 K/uL  Basic metabolic panel     Status: Abnormal   Collection Time: 08/14/16  6:50 AM  Result Value Ref Range   Sodium 142 135 - 145 mmol/L   Potassium 3.4 (L) 3.5 - 5.1 mmol/L   Chloride 107 101 - 111 mmol/L   CO2 28 22 - 32 mmol/L   Glucose, Bld 135 (H) 65 - 99 mg/dL   BUN 10 6 - 20 mg/dL   Creatinine, Ser 0.38 (L) 0.44 - 1.00 mg/dL   Calcium 8.2 (L) 8.9 - 10.3 mg/dL   GFR calc non Af Amer >60 >60 mL/min   GFR calc Af Amer >60 >60 mL/min    Comment: (NOTE) The eGFR has been calculated using the CKD EPI equation. This calculation has not been validated in all clinical situations. eGFR's persistently <60 mL/min signify possible Chronic Kidney Disease.    Anion gap 7 5 - 15  CBC     Status: None   Collection Time: 08/14/16  6:50 AM  Result Value Ref Range   WBC 4.4 3.6 - 11.0 K/uL   RBC 3.97 3.80 - 5.20 MIL/uL   Hemoglobin 12.5 12.0 - 16.0 g/dL   HCT 36.4 35.0 - 47.0 %   MCV 91.8 80.0 - 100.0 fL   MCH 31.4 26.0 - 34.0  pg   MCHC 34.2 32.0 - 36.0 g/dL   RDW 13.4 11.5 - 14.5 %   Platelets 158 150 - 440 K/uL  Glucose, capillary     Status: Abnormal   Collection Time: 08/14/16  7:33 AM  Result Value Ref Range   Glucose-Capillary 151 (H) 65 - 99 mg/dL  Glucose, capillary     Status: Abnormal   Collection Time: 08/14/16 11:32 AM  Result Value Ref Range   Glucose-Capillary 155 (H) 65 - 99 mg/dL  Glucose, capillary     Status: Abnormal   Collection Time: 08/14/16  4:42 PM  Result Value Ref Range   Glucose-Capillary 116 (H) 65 - 99 mg/dL  Glucose, capillary     Status: Abnormal   Collection Time: 08/14/16  9:19 PM  Result Value Ref Range   Glucose-Capillary 111 (H) 65 - 99 mg/dL  Basic metabolic panel     Status: Abnormal   Collection Time: 08/15/16  5:30 AM  Result Value Ref Range  Sodium 142 135 - 145 mmol/L   Potassium 3.2 (L) 3.5 - 5.1 mmol/L   Chloride 104 101 - 111 mmol/L   CO2 29 22 - 32 mmol/L   Glucose, Bld 129 (H) 65 - 99 mg/dL   BUN 12 6 - 20 mg/dL   Creatinine, Ser 0.60 0.44 - 1.00 mg/dL   Calcium 8.5 (L) 8.9 - 10.3 mg/dL   GFR calc non Af Amer >60 >60 mL/min   GFR calc Af Amer >60 >60 mL/min    Comment: (NOTE) The eGFR has been calculated using the CKD EPI equation. This calculation has not been validated in all clinical situations. eGFR's persistently <60 mL/min signify possible Chronic Kidney Disease.    Anion gap 9 5 - 15  Glucose, capillary     Status: Abnormal   Collection Time: 08/15/16  7:48 AM  Result Value Ref Range   Glucose-Capillary 143 (H) 65 - 99 mg/dL  Glucose, capillary     Status: Abnormal   Collection Time: 08/15/16 12:18 PM  Result Value Ref Range   Glucose-Capillary 191 (H) 65 - 99 mg/dL     PHQ2/9: Depression screen [facility] 2/9 08/27/2016 05/23/2016 01/23/2016 11/23/2015 10/30/2015  Decreased Interest 0 0 0 1 3  Down, Depressed, Hopeless 0 0 0 1 3  PHQ - 2 Score 0 0 0 2 6  Altered sleeping - - - 1 0  Tired, decreased energy - - - 1 1  Change in appetite -  - - 0 0  Feeling bad or failure about yourself  - - - 0 3  Trouble concentrating - - - 0 1  Moving slowly or fidgety/restless - - - 0 0  Suicidal thoughts - - - 0 0  PHQ-9 Score - - - 4 11  Difficult doing work/chores - - - Somewhat difficult Not difficult at all     Fall Risk: Fall Risk  08/27/2016 05/23/2016 01/23/2016 11/23/2015 10/30/2015  Falls in the past year? No No No No No     Assessment & Plan  1. Hospital discharge follow-up  She was admitted from 08/11/2016 through 08/15/2016 for UTI and sepsis.   2. Type 2 diabetes mellitus, uncontrolled, with neuropathy ([place] [place])  Needs to resume Trulicity but we will check labs first - Hemoglobin A1c - Dulaglutide (TRULICITY) 1.5 KW/4.0XB SOPN; Inject 1.5 mg into the skin once a week.  Dispense: 12 pen; Refill: 1  3. Benign essential HTN  - CBC with Differential/Platelet - COMPLETE METABOLIC PANEL WITH GFR - losartan (COZAAR) 100 MG tablet; Take 1 tablet (100 mg total) by mouth daily.  Dispense: 90 tablet; Refill: 1  4. Obstructive sleep apnea  Wears CPAP every night   5. Morbid obesity, unspecified obesity type [facility])  She has been losing weight, worse after hospital stay   6. Mixed dyslipidemia  - Lipid panel - atorvastatin (LIPITOR) 40 MG tablet; Take 1 tablet (40 mg total) by mouth at bedtime.  Dispense: 90 tablet; Refill: 1  7. Major depression, recurrent, chronic ([place])  She does not like therapy, she is taking medication but states still feels she is in a dark place -referral psychiatrist   8. Controlled gout  - allopurinol (ZYLOPRIM) 100 MG tablet; Take 1 tablet (100 mg total) by mouth 2 (two) times daily.  Dispense: 180 tablet; Refill: 1

## 2016-08-27 NOTE — Patient Instructions (Addendum)
12 rules of life, by Tina Short.

## 2016-08-27 NOTE — Addendum Note (Signed)
Addended by: Steele Sizer F on: 08/27/2016 09:42 AM   Modules accepted: Orders

## 2016-08-27 NOTE — Addendum Note (Signed)
Addended by: Inda Coke on: 08/27/2016 09:52 AM   Modules accepted: Orders

## 2016-09-09 ENCOUNTER — Other Ambulatory Visit: Payer: Self-pay | Admitting: Family Medicine

## 2016-09-09 DIAGNOSIS — M109 Gout, unspecified: Secondary | ICD-10-CM

## 2016-09-10 ENCOUNTER — Other Ambulatory Visit: Payer: Self-pay | Admitting: Family Medicine

## 2016-09-10 NOTE — Telephone Encounter (Signed)
Patient requesting refill of Allopurinol to [facility].

## 2016-09-11 NOTE — Telephone Encounter (Signed)
Patient requesting refill of Omega 3 to [facility].

## 2016-09-26 ENCOUNTER — Other Ambulatory Visit: Payer: Self-pay

## 2016-09-26 NOTE — Telephone Encounter (Signed)
Insurance won't allow [facility] to fill her 1000 mg ER Metformin, but will cover Metformin ER 500 mg, Glimepiride, Pioglipazone Metformin and Glucophage 500 mg. Please change and sent into [facility].

## 2016-09-27 ENCOUNTER — Other Ambulatory Visit: Payer: Self-pay | Admitting: Family Medicine

## 2016-09-27 DIAGNOSIS — E782 Mixed hyperlipidemia: Secondary | ICD-10-CM

## 2016-09-27 DIAGNOSIS — I10 Essential (primary) hypertension: Secondary | ICD-10-CM

## 2016-09-29 ENCOUNTER — Other Ambulatory Visit: Payer: Self-pay | Admitting: Family Medicine

## 2016-09-29 MED ORDER — METFORMIN HCL ER 500 MG PO TB24: 1000.0000 mg | ORAL_TABLET | Freq: Every day | ORAL | 1 refills | Status: DC

## 2016-10-03 ENCOUNTER — Telehealth: Payer: Self-pay | Admitting: Family Medicine

## 2016-10-03 MED ORDER — METFORMIN HCL ER 500 MG PO TB24: 1000.0000 mg | ORAL_TABLET | Freq: Two times a day (BID) | ORAL | 1 refills | Status: DC

## 2016-10-03 NOTE — Telephone Encounter (Signed)
Insurance will not cover the metformin 1000 mg. Asking that you change to 500 mg 4x daily

## 2016-10-16 ENCOUNTER — Other Ambulatory Visit: Payer: Self-pay | Admitting: Family Medicine

## 2016-10-16 DIAGNOSIS — F339 Major depressive disorder, recurrent, unspecified: Secondary | ICD-10-CM

## 2016-10-16 NOTE — Telephone Encounter (Signed)
Patient requesting refill of Venlafaxine to [facility].

## 2016-11-03 NOTE — Telephone Encounter (Signed)
Please close the encounter. It will not allow me to do it. Thank you

## 2016-11-19 ENCOUNTER — Telehealth: Payer: Self-pay

## 2016-11-19 LAB — COMPREHENSIVE METABOLIC PANEL
A/G RATIO: 1.5 (ref 1.2–2.2)
ALK PHOS: 57 IU/L (ref 39–117)
ALT: 35 IU/L — AB (ref 0–32)
AST: 39 IU/L (ref 0–40)
Albumin: 4 g/dL (ref 3.6–4.8)
BILIRUBIN TOTAL: 0.7 mg/dL (ref 0.0–1.2)
BUN / CREAT RATIO: 15 (ref 12–28)
BUN: 10 mg/dL (ref 8–27)
CO2: 24 mmol/L (ref 20–29)
Calcium: 9.1 mg/dL (ref 8.7–10.3)
Chloride: 101 mmol/L (ref 96–106)
Creatinine, Ser: 0.65 mg/dL (ref 0.57–1.00)
GFR calc non Af Amer: 95 mL/min/{1.73_m2} (ref >59)
GFR, EST AFRICAN AMERICAN: 110 mL/min/{1.73_m2} (ref >59)
GLUCOSE: 185 mg/dL — AB (ref 65–99)
Globulin, Total: 2.6 g/dL (ref 1.5–4.5)
Potassium: 4 mmol/L (ref 3.5–5.2)
Sodium: 143 mmol/L (ref 134–144)
TOTAL PROTEIN: 6.6 g/dL (ref 6.0–8.5)

## 2016-11-19 LAB — LIPID PANEL
CHOL/HDL RATIO: 2.8 ratio (ref 0.0–4.4)
CHOLESTEROL TOTAL: 102 mg/dL (ref 100–199)
HDL: 36 mg/dL — AB (ref >39)
LDL CALC: 20 mg/dL (ref 0–99)
Triglycerides: 232 mg/dL — ABNORMAL HIGH (ref 0–149)
VLDL CHOLESTEROL CAL: 46 mg/dL — AB (ref 5–40)

## 2016-11-19 LAB — HEMOGLOBIN A1C
ESTIMATED AVERAGE GLUCOSE: 192 mg/dL
HEMOGLOBIN A1C: 8.3 % — AB (ref 4.8–5.6)

## 2016-11-19 LAB — CBC
Hematocrit: 40.2 % (ref 34.0–46.6)
Hemoglobin: 13.7 g/dL (ref 11.1–15.9)
MCH: 31.9 pg (ref 26.6–33.0)
MCHC: 34.1 g/dL (ref 31.5–35.7)
MCV: 94 fL (ref 79–97)
PLATELETS: 155 10*3/uL (ref 150–379)
RBC: 4.29 x10E6/uL (ref 3.77–5.28)
RDW: 13.4 % (ref 12.3–15.4)
WBC: 5.3 10*3/uL (ref 3.4–10.8)

## 2016-11-19 NOTE — Telephone Encounter (Signed)
-----   Message from Steele Sizer, MD sent at 11/19/2016  1:16 PM EDT ----- Lipid panel has shown an improvement Lipid panel shows low HDL : to improve HDL patient  needs to eat tree nuts ( pecans/pistachios/almonds ) four times weekly, eat fish two times weekly  and exercise  at least 150 minutes per week Normal CBC Glucose is elevated, normal kidney and liver function test hgbA1C is not at goal, above 7, we will discuss options in Nov when she returns for follow up

## 2016-11-19 NOTE — Telephone Encounter (Signed)
Left message for patient to return my call regarding labs.  

## 2016-11-24 ENCOUNTER — Telehealth: Payer: Self-pay

## 2016-11-24 NOTE — Telephone Encounter (Signed)
I called patient to let her know that her Health Screening form for [facility] is complete and ready for pickup.

## 2016-11-27 ENCOUNTER — Encounter: Payer: Self-pay | Admitting: Family Medicine

## 2016-11-27 ENCOUNTER — Ambulatory Visit (INDEPENDENT_AMBULATORY_CARE_PROVIDER_SITE_OTHER): Payer: 59 | Admitting: Family Medicine

## 2016-11-27 VITALS — BP 110/60 | HR 93 | Resp 16 | Ht 68.0 in | Wt 293.0 lb

## 2016-11-27 DIAGNOSIS — M109 Gout, unspecified: Secondary | ICD-10-CM | POA: Diagnosis not present

## 2016-11-27 DIAGNOSIS — R600 Localized edema: Secondary | ICD-10-CM

## 2016-11-27 DIAGNOSIS — R42 Dizziness and giddiness: Secondary | ICD-10-CM | POA: Diagnosis not present

## 2016-11-27 DIAGNOSIS — G4733 Obstructive sleep apnea (adult) (pediatric): Secondary | ICD-10-CM

## 2016-11-27 DIAGNOSIS — E1129 Type 2 diabetes mellitus with other diabetic kidney complication: Secondary | ICD-10-CM

## 2016-11-27 DIAGNOSIS — Z23 Encounter for immunization: Secondary | ICD-10-CM | POA: Diagnosis not present

## 2016-11-27 DIAGNOSIS — E1140 Type 2 diabetes mellitus with diabetic neuropathy, unspecified: Secondary | ICD-10-CM | POA: Diagnosis not present

## 2016-11-27 DIAGNOSIS — F339 Major depressive disorder, recurrent, unspecified: Secondary | ICD-10-CM

## 2016-11-27 DIAGNOSIS — E782 Mixed hyperlipidemia: Secondary | ICD-10-CM | POA: Diagnosis not present

## 2016-11-27 DIAGNOSIS — R809 Proteinuria, unspecified: Secondary | ICD-10-CM

## 2016-11-27 DIAGNOSIS — E1165 Type 2 diabetes mellitus with hyperglycemia: Secondary | ICD-10-CM

## 2016-11-27 DIAGNOSIS — R197 Diarrhea, unspecified: Secondary | ICD-10-CM | POA: Diagnosis not present

## 2016-11-27 DIAGNOSIS — IMO0002 Reserved for concepts with insufficient information to code with codable children: Secondary | ICD-10-CM

## 2016-11-27 DIAGNOSIS — I10 Essential (primary) hypertension: Secondary | ICD-10-CM | POA: Diagnosis not present

## 2016-11-27 MED ORDER — LOSARTAN POTASSIUM 100 MG PO TABS: 100.0000 mg | ORAL_TABLET | Freq: Every day | ORAL | 1 refills | Status: DC

## 2016-11-27 MED ORDER — POTASSIUM CHLORIDE CRYS ER 20 MEQ PO TBCR: 20.0000 meq | EXTENDED_RELEASE_TABLET | Freq: Every day | ORAL | 1 refills | Status: DC

## 2016-11-27 MED ORDER — ATORVASTATIN CALCIUM 40 MG PO TABS: 40.0000 mg | ORAL_TABLET | Freq: Every day | ORAL | 1 refills | Status: DC

## 2016-11-27 MED ORDER — METFORMIN HCL ER 500 MG PO TB24: 500.0000 mg | ORAL_TABLET | Freq: Two times a day (BID) | ORAL | 1 refills | Status: DC

## 2016-11-27 MED ORDER — FUROSEMIDE 40 MG PO TABS: ORAL_TABLET | ORAL | 1 refills | Status: DC

## 2016-11-27 MED ORDER — SEMAGLUTIDE(0.25 OR 0.5MG/DOS) 2 MG/1.5ML ~~LOC~~ SOPN: 0.5000 mg | PEN_INJECTOR | SUBCUTANEOUS | 1 refills | Status: DC

## 2016-11-27 MED ORDER — ALLOPURINOL 100 MG PO TABS: 100.0000 mg | ORAL_TABLET | Freq: Two times a day (BID) | ORAL | 0 refills | Status: DC

## 2016-11-27 MED ORDER — VENLAFAXINE HCL ER 150 MG PO CP24: ORAL_CAPSULE | ORAL | 1 refills | Status: DC

## 2016-11-27 NOTE — Progress Notes (Signed)
Name: Tina Short   MRN: [id number]    DOB: 08/31/1955   Date:11/27/2016       Progress Note  Subjective  Chief Complaint  Chief Complaint  Patient presents with  . Diabetes  . Depression  . Hypertension  . Hyperlipidemia    HPI  DMII: she has a history of DM for many years, seen by Endocrinologist in the past. Last hgbA1C was up to 7.1% and up to 8.3% last week.  She was onTrulicity and Fortamet ( since 10/2015 ), but has been off Trulicity since hospital stay back in July 2017.because she thought it was causing diarrhea.. She denies polyphagia, polydipsia and polyuria. She takes statin, Losartan ( [facility]) taking aspirin sometimes  She has neuropathy, but is doing better since glucose is at goal. We will adjust medication, cannot tolerate SGL-2 agonist, we will switch to Ozempic but explained diarrhea unrelated to Trulicity since she stopped medication and still has symptoms. We will try decreasing dose of Metformin since the new formulation may be the cause of her diarrhea.   Morbid obesity: she has a long history of obesity, she was started on Trulicity in 33/2951 and was responding well, she had lost weight, however sometime this Summer she developed diarrhea and she stopped the Trulicity, she has gained weight since  Major Depression: Her father committed suicide in his 26's and there are other family members with history of psychiatric illness. 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 She states that she is doing her part in trying to do things even when she does not feel like it. She has not missed any work. She has not been shopping or cleaning her house, she doe not get out of  the house for pleasure.We increased Effexor to 300 mg daily in September 2017  but she has not noticed much of a change in the mood, so she is back on 150 mg daily. We placed referral to psychiatrist however she never got a  call. . Symptoms worse during Winter months, and had a set back Summer of 2018 because of  admission to [facility] for UTI and sepsis. She states her boss knows she has depression. She states she gets distracted when at work, but feels down and tired when she gets home, she states she wakes up feeling heavy and tired. She feels guilty because the best part of her is at work. She states she did not tell CMA on PHQ 9 , but she states she would prefer not to live, but she would never commit suicide  OSA: she uses CPAP every night. She had a repeat study, compliant with medication. She states that if she does not use CPAP she feels shaky and has a headache when she wakes up  B12 deficiency: she has been getting B12, but not monthly, she still feels tired.   Diarrhea: she has noticed change in bowel movements since Summer 2018, she had a change from Metformin formulation ( per insurance request). She states has water or lose stools after she eats any meal, but worse after [place], chinese or salads. She has to take a change of clothe to work. No fever, chills or nausea or abdominal pain. No weight loss. No blood or mucus in stools. No travel to change in bowel movements.   Vertigo: she was having severe nausea, vomiting, and spinning sensation, going on since May 2018. She states  initially severe, now occasionally, happens with head movement, including when rolling in bed.   Gout: no recent episodes, she is now on Allopurinol twice daily and denies side effects of medication   Patient Active Problem List   Diagnosis Date Noted  . Sepsis ([place]) 08/10/2016  . Encounter for screening colonoscopy 04/30/2016  . Major depression, recurrent, chronic ([place]) 10/30/2015  . Dyslipidemia associated with type 2 diabetes mellitus ([place]) 10/30/2015  . Vitamin D deficiency 07/27/2015  . Vitamin B12 deficiency 07/27/2015  . History of anemia 09/12/2014  . Carpal tunnel syndrome 09/12/2014  . Obstructive sleep apnea  09/12/2014  . Edema extremities 09/12/2014  . Gout 09/12/2014  . Extreme obesity 11/15/2009  . Benign essential HTN 10/07/2006  . Type 2 diabetes mellitus, uncontrolled, with neuropathy ([place]) 10/07/2006  . Hyperlipidemia 10/07/2006    Past Surgical History:  Procedure Laterality Date  . ABDOMINAL HYSTERECTOMY    . COLONOSCOPY    . COLONOSCOPY WITH PROPOFOL N/A 05/14/2016   Procedure: COLONOSCOPY WITH PROPOFOL;  Surgeon: [name], MD;  Location: [facility] ENDOSCOPY;  Service: Endoscopy;  Laterality: N/A;  . FOOT SURGERY Right    X2  . SHOULDER SURGERY Right   . WRIST SURGERY Bilateral     Family History  Problem Relation Age of Onset  . Diabetes Mother   . Cancer Mother        uterine  . Mental illness Father   . Obesity Daughter   . Diabetes Maternal Grandmother   . Heart disease Brother   . Obesity Daughter     Social History   Social History  . Marital status: Married    Spouse name: N/A  . Number of children: N/A  . Years of education: N/A   Occupational History  . Not on file.   Social History Main Topics  . Smoking status: Never Smoker  . Smokeless tobacco: Never Used  . Alcohol use No  . Drug use: No  . Sexual activity: Yes    Partners: Male   Other Topics Concern  . Not on file   Social History Narrative  . No narrative on file     Current Outpatient Prescriptions:  .  ACCU-CHEK AVIVA PLUS test strip, Test once daily, Disp: 100 each, Rfl: 11 .  allopurinol (ZYLOPRIM) 100 MG tablet, Take 1 tablet (100 mg total) by mouth 2 (two) times daily., Disp: 180 tablet, Rfl: 0 .  aspirin EC 81 MG tablet, Take 1 tablet (81 mg total) by mouth daily., Disp: 30 tablet, Rfl: 0 .  atorvastatin (LIPITOR) 40 MG tablet, Take 1 tablet (40 mg total) by mouth at bedtime., Disp: 90 tablet, Rfl: 1 .  furosemide (LASIX) 40 MG tablet, Daily, Disp: 90 tablet, Rfl: 1 .  losartan (COZAAR) 100 MG tablet, Take 1 tablet (100 mg total) by mouth daily., Disp: 90 tablet, Rfl:  1 .  metFORMIN (GLUCOPHAGE-XR) 500 MG 24 hr tablet, Take 1 tablet (500 mg total) by mouth 2 (two) times daily., Disp: 180 tablet, Rfl: 1 .  Multiple Vitamin (MULTIVITAMIN) capsule, Take 1 capsule by mouth daily., Disp: , Rfl:  .  omega-3 acid ethyl esters (LOVAZA) 1 g capsule, TAKE 2 CAPSULES BY MOUTH  TWO TIMES DAILY, Disp: 360 capsule, Rfl: 1 .  potassium chloride SA (K-DUR,KLOR-CON) 20 MEQ tablet, Take 1 tablet (20 mEq total) by mouth daily., Disp: 90 tablet, Rfl: 1 .  Semaglutide (OZEMPIC) 0.25 or 0.5 MG/DOSE SOPN, Inject 0.5 mg into the skin once a week., Disp:  9 mL, Rfl: 1 .  venlafaxine XR (EFFEXOR-XR) 150 MG 24 hr capsule, One daily, Disp: 90 capsule, Rfl: 1 .  Vitamin D, Ergocalciferol, (DRISDOL) 50000 units CAPS capsule, TAKE 1 CAPSULE BY MOUTH  EVERY 7 DAYS, Disp: 12 capsule, Rfl: 1  Allergies  Allergen Reactions  . Abilify  [Aripiprazole]     Other reaction(s): Difficulty breathing  . Invokana [Canagliflozin] Itching    Yeast infections     ROS  Constitutional: Negative for fever, positive for  weight change.  Respiratory: Negative for cough and shortness of breath.   Cardiovascular: Negative for chest pain or palpitations.  Gastrointestinal: Negative for abdominal pain, positive for  bowel changes.  Musculoskeletal: Negative for gait problem or joint swelling.  Skin: Negative for rash.  Neurological: Negative for dizziness or headache.  No other specific complaints in a complete review of systems (except as listed in HPI above).  Objective  Vitals:   11/27/16 0757  BP: 110/60  Pulse: 93  Resp: 16  SpO2: 97%  Weight: 293 lb (132.9 kg)  Height: 5\' 8"  (1.727 m)    Body mass index is 44.55 kg/m.  Physical Exam  Constitutional: Patient appears well-developed and well-nourished. Obese  No distress.  HEENT: head atraumatic, normocephalic, pupils equal and reactive to light, ears normal TM bilaterally, neck supple, throat within normal limits Cardiovascular:  Normal rate, regular rhythm and normal heart sounds.  No murmur heard. trace  BLE edema. Pulmonary/Chest: Effort normal and breath sounds normal. No respiratory distress. Abdominal: Soft.  There is no tenderness. Psychiatric: Patient has a normal mood and affect. behavior is normal. Judgment and thought content normal.  Recent Results (from the past 2160 hour(s))  Lipid Profile     Status: Abnormal   Collection Time: 11/18/16  8:34 AM  Result Value Ref Range   Cholesterol, Total 102 100 - 199 mg/dL   Triglycerides 232 (H) 0 - 149 mg/dL   HDL 36 (L) >39 mg/dL    Comment: **Effective December 01, 2016, HDL Cholesterol**   reference interval will be changing to:                                   Female        Female                               43 - [phone]   72 - [phone]    VLDL Cholesterol Cal 46 (H) 5 - 40 mg/dL   LDL Calculated 20 0 - 99 mg/dL   Chol/HDL Ratio 2.8 0.0 - 4.4 ratio    Comment:                                   T. Chol/HDL Ratio                                             Men  Women                               1/2 Avg.Risk  3.4    3.3  Avg.Risk  5.0    4.4                                2X Avg.Risk  9.6    7.1                                3X Avg.Risk 23.4   11.0   CBC     Status: None   Collection Time: 11/18/16  8:34 AM  Result Value Ref Range   WBC 5.3 3.4 - 10.8 x10E3/uL   RBC 4.29 3.77 - 5.28 x10E6/uL   Hemoglobin 13.7 11.1 - 15.9 g/dL   Hematocrit 40.2 34.0 - 46.6 %   MCV 94 79 - 97 fL   MCH 31.9 26.6 - 33.0 pg   MCHC 34.1 31.5 - 35.7 g/dL   RDW 13.4 12.3 - 15.4 %   Platelets 155 150 - 379 x10E3/uL  Comprehensive Metabolic Panel (CMET)     Status: Abnormal   Collection Time: 11/18/16  8:34 AM  Result Value Ref Range   Glucose 185 (H) 65 - 99 mg/dL   BUN 10 8 - 27 mg/dL   Creatinine, Ser 0.65 0.57 - 1.00 mg/dL   GFR calc non Af Amer 95 >59 mL/min/1.73   GFR calc Af Amer 110 >59 mL/min/1.73   BUN/Creatinine Ratio 15 12  - 28   Sodium 143 134 - 144 mmol/L   Potassium 4.0 3.5 - 5.2 mmol/L   Chloride 101 96 - 106 mmol/L   CO2 24 20 - 29 mmol/L   Calcium 9.1 8.7 - 10.3 mg/dL   Total Protein 6.6 6.0 - 8.5 g/dL   Albumin 4.0 3.6 - 4.8 g/dL   Globulin, Total 2.6 1.5 - 4.5 g/dL   Albumin/Globulin Ratio 1.5 1.2 - 2.2   Bilirubin Total 0.7 0.0 - 1.2 mg/dL   Alkaline Phosphatase 57 39 - 117 IU/L   AST 39 0 - 40 IU/L   ALT 35 (H) 0 - 32 IU/L  HgB A1c     Status: Abnormal   Collection Time: 11/18/16  8:34 AM  Result Value Ref Range   Hgb A1c MFr Bld 8.3 (H) 4.8 - 5.6 %    Comment:          Prediabetes: 5.7 - 6.4          Diabetes: >6.4          Glycemic control for adults with diabetes: <7.0    Est. average glucose Bld gHb Est-mCnc 192 mg/dL    Diabetic Foot Exam: Diabetic Foot Exam - Simple   Simple Foot Form Diabetic Foot exam was performed with the following findings:  Yes 11/27/2016 11:01 AM  Visual Inspection No deformities, no ulcerations, no other skin breakdown bilaterally:  Yes Sensation Testing Intact to touch and monofilament testing bilaterally:  Yes Pulse Check Posterior Tibialis and Dorsalis pulse intact bilaterally:  Yes Comments      PHQ2/9: Depression screen [facility] 2/9 11/27/2016 08/27/2016 05/23/2016 01/23/2016 11/23/2015  Decreased Interest 3 0 0 0 1  Down, Depressed, Hopeless 3 0 0 0 1  PHQ - 2 Score 6 0 0 0 2  Altered sleeping 1 - - - 1  Tired, decreased energy 3 - - - 1  Change in appetite 0 - - - 0  Feeling bad or failure about yourself  1 - - -  0  Trouble concentrating 0 - - - 0  Moving slowly or fidgety/restless 0 - - - 0  Suicidal thoughts 0 - - - 0  PHQ-9 Score 11 - - - 4  Difficult doing work/chores Very difficult - - - Somewhat difficult     Fall Risk: Fall Risk  11/27/2016 08/27/2016 05/23/2016 01/23/2016 11/23/2015  Falls in the past year? No No No No No     Assessment & Plan  1. Type 2 diabetes mellitus, uncontrolled, with neuropathy ([facility])  - metFORMIN  (GLUCOPHAGE-XR) 500 MG 24 hr tablet; Take 1 tablet (500 mg total) by mouth 2 (two) times daily.  Dispense: 180 tablet; Refill: 1 - Semaglutide (OZEMPIC) 0.25 or 0.5 MG/DOSE SOPN; Inject 0.5 mg into the skin once a week.  Dispense: 9 mL; Refill: 1  2. Mixed dyslipidemia  - atorvastatin (LIPITOR) 40 MG tablet; Take 1 tablet (40 mg total) by mouth at bedtime.  Dispense: 90 tablet; Refill: 1  3. Edema extremities  - furosemide (LASIX) 40 MG tablet; Daily  Dispense: 90 tablet; Refill: 1 - potassium chloride SA (K-DUR,KLOR-CON) 20 MEQ tablet; Take 1 tablet (20 mEq total) by mouth daily.  Dispense: 90 tablet; Refill: 1  4. Benign essential HTN  - losartan (COZAAR) 100 MG tablet; Take 1 tablet (100 mg total) by mouth daily.  Dispense: 90 tablet; Refill: 1  5. Major depression, recurrent, chronic ([facility])  - venlafaxine XR (EFFEXOR-XR) 150 MG 24 hr capsule; One daily  Dispense: 90 capsule; Refill: 1 PHQ9 is up to 11 today.   6. Obstructive sleep apnea  Discussed need of CPAP  7. Morbid obesity, unspecified obesity type ([place])  Gaining weight, off Trulicity   8. Type 2 diabetes mellitus with microalbuminuria, without long-term current use of insulin ([facility])  - metFORMIN (GLUCOPHAGE-XR) 500 MG 24 hr tablet; Take 1 tablet (500 mg total) by mouth 2 (two) times daily.  Dispense: 180 tablet; Refill: 1 - Semaglutide (OZEMPIC) 0.25 or 0.5 MG/DOSE SOPN; Inject 0.5 mg into the skin once a week.  Dispense: 9 mL; Refill: 1  9. Controlled gout  - allopurinol (ZYLOPRIM) 100 MG tablet; Take 1 tablet (100 mg total) by mouth 2 (two) times daily.  Dispense: 180 tablet; Refill: 0  10. Diarrhea, unspecified type  It may be secondary to change in Metformin ER from 1000 mg to two 500 mg twice daily, we will decrease dose to one to two daily and if diarrhea resolves no need for stool sample, otherwise she will bring it back for testing - GI Profile, Stool, PCR  11. Vertigo  - Ambulatory referral to ENT

## 2016-12-16 ENCOUNTER — Encounter: Payer: Self-pay | Admitting: Family Medicine

## 2016-12-16 ENCOUNTER — Ambulatory Visit (INDEPENDENT_AMBULATORY_CARE_PROVIDER_SITE_OTHER): Payer: 59 | Admitting: Family Medicine

## 2016-12-16 VITALS — BP 130/80 | HR 98 | Resp 14 | Ht 68.0 in | Wt 290.7 lb

## 2016-12-16 DIAGNOSIS — E1169 Type 2 diabetes mellitus with other specified complication: Secondary | ICD-10-CM | POA: Diagnosis not present

## 2016-12-16 DIAGNOSIS — E785 Hyperlipidemia, unspecified: Secondary | ICD-10-CM

## 2016-12-16 DIAGNOSIS — E559 Vitamin D deficiency, unspecified: Secondary | ICD-10-CM

## 2016-12-16 DIAGNOSIS — Z01419 Encounter for gynecological examination (general) (routine) without abnormal findings: Secondary | ICD-10-CM

## 2016-12-16 LAB — POCT GLYCOSYLATED HEMOGLOBIN (HGB A1C): Hemoglobin A1C: 7.9

## 2016-12-16 MED ORDER — VITAMIN D (ERGOCALCIFEROL) 1.25 MG (50000 UNIT) PO CAPS: ORAL_CAPSULE | ORAL | 1 refills | Status: DC
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
## 2016-12-16 NOTE — Patient Instructions (Signed)
Preventive Care 40-64 Years, Female Preventive care refers to lifestyle choices and visits with your health care provider that can promote health and wellness. What does preventive care include?  A yearly physical exam. This is also called an annual well check.  Dental exams once or twice a year.  Routine eye exams. Ask your health care provider how often you should have your eyes checked.  Personal lifestyle choices, including: ? Daily care of your teeth and gums. ? Regular physical activity. ? Eating a healthy diet. ? Avoiding tobacco and drug use. ? Limiting alcohol use. ? Practicing safe sex. ? Taking low-dose aspirin daily starting at age 58. ? Taking vitamin and mineral supplements as recommended by your health care provider. What happens during an annual well check? The services and screenings done by your health care provider during your annual well check will depend on your age, overall health, lifestyle risk factors, and family history of disease. Counseling Your health care provider may ask you questions about your:  Alcohol use.  Tobacco use.  Drug use.  Emotional well-being.  Home and relationship well-being.  Sexual activity.  Eating habits.  Work and work Statistician.  Method of birth control.  Menstrual cycle.  Pregnancy history.  Screening You may have the following tests or measurements:  Height, weight, and BMI.  Blood pressure.  Lipid and cholesterol levels. These may be checked every 5 years, or more frequently if you are over 81 years old.  Skin check.  Lung cancer screening. You may have this screening every year starting at age 78 if you have a 30-pack-year history of smoking and currently smoke or have quit within the past 15 years.  Fecal occult blood test (FOBT) of the stool. You may have this test every year starting at age 65.  Flexible sigmoidoscopy or colonoscopy. You may have a sigmoidoscopy every 5 years or a colonoscopy  every 10 years starting at age 30.  Hepatitis C blood test.  Hepatitis B blood test.  Sexually transmitted disease (STD) testing.  Diabetes screening. This is done by checking your blood sugar (glucose) after you have not eaten for a while (fasting). You may have this done every 1-3 years.  Mammogram. This may be done every 1-2 years. Talk to your health care provider about when you should start having regular mammograms. This may depend on whether you have a family history of breast cancer.  BRCA-related cancer screening. This may be done if you have a family history of breast, ovarian, tubal, or peritoneal cancers.  Pelvic exam and Pap test. This may be done every 3 years starting at age 80. Starting at age 36, this may be done every 5 years if you have a Pap test in combination with an HPV test.  Bone density scan. This is done to screen for osteoporosis. You may have this scan if you are at high risk for osteoporosis.  Discuss your test results, treatment options, and if necessary, the need for more tests with your health care provider. Vaccines Your health care provider may recommend certain vaccines, such as:  Influenza vaccine. This is recommended every year.  Tetanus, diphtheria, and acellular pertussis (Tdap, Td) vaccine. You may need a Td booster every 10 years.  Varicella vaccine. You may need this if you have not been vaccinated.  Zoster vaccine. You may need this after age 5.  Measles, mumps, and rubella (MMR) vaccine. You may need at least one dose of MMR if you were born in  1958 or later. You may also need a second dose.  Pneumococcal 13-valent conjugate (PCV13) vaccine. You may need this if you have certain conditions and were not previously vaccinated.  Pneumococcal polysaccharide (PPSV23) vaccine. You may need one or two doses if you smoke cigarettes or if you have certain conditions.  Meningococcal vaccine. You may need this if you have certain  conditions.  Hepatitis A vaccine. You may need this if you have certain conditions or if you travel or work in places where you may be exposed to hepatitis A.  Hepatitis B vaccine. You may need this if you have certain conditions or if you travel or work in places where you may be exposed to hepatitis B.  Haemophilus influenzae type b (Hib) vaccine. You may need this if you have certain conditions.  Talk to your health care provider about which screenings and vaccines you need and how often you need them. This information is not intended to replace advice given to you by your health care provider. Make sure you discuss any questions you have with your health care provider. Document Released: 02/23/2015 Document Revised: 10/17/2015 Document Reviewed: 11/28/2014 Elsevier Interactive Patient Education  2017 [employer].

## 2016-12-22 ENCOUNTER — Encounter: Payer: Self-pay | Admitting: Family Medicine

## 2016-12-22 ENCOUNTER — Encounter: Payer: Self-pay | Admitting: *Deleted

## 2016-12-22 ENCOUNTER — Ambulatory Visit: Payer: 59 | Admitting: Family Medicine

## 2016-12-22 VITALS — BP 120/84 | HR 82 | Temp 98.2°F | Resp 18 | Ht 68.0 in | Wt 287.0 lb

## 2016-12-22 DIAGNOSIS — R197 Diarrhea, unspecified: Secondary | ICD-10-CM

## 2016-12-22 DIAGNOSIS — K08409 Partial loss of teeth, unspecified cause, unspecified class: Secondary | ICD-10-CM | POA: Diagnosis not present

## 2016-12-22 DIAGNOSIS — R05 Cough: Secondary | ICD-10-CM | POA: Diagnosis not present

## 2016-12-22 DIAGNOSIS — R059 Cough, unspecified: Secondary | ICD-10-CM

## 2016-12-22 DIAGNOSIS — J9801 Acute bronchospasm: Secondary | ICD-10-CM | POA: Diagnosis not present

## 2016-12-22 DIAGNOSIS — IMO0002 Reserved for concepts with insufficient information to code with codable children: Secondary | ICD-10-CM

## 2016-12-22 DIAGNOSIS — Z8619 Personal history of other infectious and parasitic diseases: Secondary | ICD-10-CM | POA: Diagnosis not present

## 2016-12-22 DIAGNOSIS — R110 Nausea: Secondary | ICD-10-CM | POA: Diagnosis not present

## 2016-12-22 DIAGNOSIS — E1140 Type 2 diabetes mellitus with diabetic neuropathy, unspecified: Secondary | ICD-10-CM | POA: Diagnosis not present

## 2016-12-22 DIAGNOSIS — E1165 Type 2 diabetes mellitus with hyperglycemia: Secondary | ICD-10-CM

## 2016-12-22 LAB — GLUCOSE, POCT (MANUAL RESULT ENTRY): POC GLUCOSE: 179 mg/dL — AB (ref 70–99)

## 2016-12-22 MED ORDER — BENZONATATE 100 MG PO CAPS: 100.0000 mg | ORAL_CAPSULE | Freq: Three times a day (TID) | ORAL | 0 refills | Status: DC | PRN

## 2016-12-22 MED ORDER — ALBUTEROL SULFATE HFA 108 (90 BASE) MCG/ACT IN AERS: 2.0000 | INHALATION_SPRAY | Freq: Four times a day (QID) | RESPIRATORY_TRACT | 0 refills | Status: DC | PRN

## 2016-12-22 NOTE — Progress Notes (Signed)
Name: Tina Short   MRN: [id number]    DOB: 06/05/1955   Date:12/22/2016       Progress Note  Subjective  Chief Complaint  Chief Complaint  Patient presents with  . URI    chest congestion, nasal drainage, no appetite for 3 days    HPI  Patient presents with complaint  Started 4 days ago with chest congestion and cough; still having these symptoms and experiencing nausea, diarrhea, feeling weak and fatigued, intermittent subjective fevers and chills, some decreased appetite, nasal congestion; had an episode of shortness of breath.  Denies: Ear pain/pressure, chest pain or abdominal pain.  She has had 4-5 episodes of diarrhea in the last 24 hours and is described as watery.  OTC Medications - has tried Mucinex DM (x3 doses - doesn't want to continue taking), and ibuprofen.  Pt notes she had dental procedure 6 days ago - had two teeth removed in the LEFT upper gumline.  She states that she was not given antibiotics for this procedure.  She has been having ongoing pain and tenderness to the area.  She does not have a follow up scheduled with the dentist at this time.  Diabetes: Pt is diabetic, has not had much of an appetite due to illness, and states she has not checked her BG in several weeks because she ran out of her supplies. She is not sure how her BG has been affected by her current illness.  BG in office today is 179.  Patient Active Problem List   Diagnosis Date Noted  . Sepsis ([place]) 08/10/2016  . Encounter for screening colonoscopy 04/30/2016  . Major depression, recurrent, chronic ([place]) 10/30/2015  . Dyslipidemia associated with type 2 diabetes mellitus ([place]) 10/30/2015  . Vitamin D deficiency 07/27/2015  . Vitamin B12 deficiency 07/27/2015  . History of anemia 09/12/2014  . Carpal tunnel syndrome 09/12/2014  . Obstructive sleep apnea 09/12/2014  . Edema extremities 09/12/2014  . Gout 09/12/2014  . Extreme obesity 11/15/2009  . Benign essential HTN 10/07/2006  . Type  2 diabetes mellitus, uncontrolled, with neuropathy ([place]) 10/07/2006  . Hyperlipidemia 10/07/2006    Social History   Tobacco Use  . Smoking status: Never Smoker  . Smokeless tobacco: Never Used  Substance Use Topics  . Alcohol use: No    Alcohol/week: 0.0 oz     Current Outpatient Medications:  .  ACCU-CHEK AVIVA PLUS test strip, Test once daily, Disp: 100 each, Rfl: 11 .  allopurinol (ZYLOPRIM) 100 MG tablet, Take 1 tablet (100 mg total) by mouth 2 (two) times daily., Disp: 180 tablet, Rfl: 0 .  aspirin EC 81 MG tablet, Take 1 tablet (81 mg total) by mouth daily., Disp: 30 tablet, Rfl: 0 .  atorvastatin (LIPITOR) 40 MG tablet, Take 1 tablet (40 mg total) by mouth at bedtime., Disp: 90 tablet, Rfl: 1 .  furosemide (LASIX) 40 MG tablet, Daily, Disp: 90 tablet, Rfl: 1 .  losartan (COZAAR) 100 MG tablet, Take 1 tablet (100 mg total) by mouth daily., Disp: 90 tablet, Rfl: 1 .  metFORMIN (GLUCOPHAGE-XR) 500 MG 24 hr tablet, Take 1 tablet (500 mg total) by mouth 2 (two) times daily., Disp: 180 tablet, Rfl: 1 .  Multiple Vitamin (MULTIVITAMIN) capsule, Take 1 capsule by mouth daily., Disp: , Rfl:  .  omega-3 acid ethyl esters (LOVAZA) 1 g capsule, TAKE 2 CAPSULES BY MOUTH  TWO TIMES DAILY, Disp: 360 capsule, Rfl: 1 .  potassium chloride SA (K-DUR,KLOR-CON) 20 MEQ tablet, Take  1 tablet (20 mEq total) by mouth daily., Disp: 90 tablet, Rfl: 1 .  Semaglutide (OZEMPIC) 0.25 or 0.5 MG/DOSE SOPN, Inject 0.5 mg into the skin once a week., Disp: 9 mL, Rfl: 1 .  venlafaxine XR (EFFEXOR-XR) 150 MG 24 hr capsule, One daily, Disp: 90 capsule, Rfl: 1 .  Vitamin D, Ergocalciferol, (DRISDOL) 50000 units CAPS capsule, TAKE 1 CAPSULE BY MOUTH  EVERY 7 DAYS, Disp: 12 capsule, Rfl: 1  Allergies  Allergen Reactions  . Abilify  [Aripiprazole]     Other reaction(s): Difficulty breathing  . Invokana [Canagliflozin] Itching    Yeast infections    ROS  Ten systems reviewed and is negative except as mentioned  in HPI  Objective  Vitals:   12/22/16 1110  BP: 120/84  Pulse: 82  Resp: 18  Temp: 98.2 F (36.8 C)  TempSrc: Oral  SpO2: 98%  Weight: 287 lb (130.2 kg)  Height: 5\' 8"  (1.727 m)   Body mass index is 43.64 kg/m.  Nursing Note and Vital Signs reviewed.  Physical Exam  HENT:  Mouth/Throat:     Constitutional: Patient appears well-developed and well-nourished. Obese No distress.  HEENT: head atraumatic, normocephalic, pupils equal and reactive to light, EOM's intact, TM's without erythema or bulging, no maxillary or frontal sinus pain on palpation, neck supple without lymphadenopathy, oropharynx pink and moist without exudate.  Poor dentition with several teeth missing - teeth marked above were pulled 6 days ago no stitches are present, area is tender on palpation but is non-edematous and minimally erythematous without exudate or bleeding. Cardiovascular: Normal rate, regular rhythm, S1/S2 present.  No murmur or rub heard. No BLE edema. Pulmonary/Chest: Effort normal and breath sounds clear. No respiratory distress or retractions. Abdominal: Soft and non-tender, bowel sounds present x4 quadrants.  No CVA Tenderness Psychiatric: Patient has a normal mood and affect. behavior is normal. Judgment and thought content normal.  Recent Results (from the past 2160 hour(s))  Lipid Profile     Status: Abnormal   Collection Time: 11/18/16  8:34 AM  Result Value Ref Range   Cholesterol, Total 102 100 - 199 mg/dL   Triglycerides 232 (H) 0 - 149 mg/dL   HDL 36 (L) >39 mg/dL    Comment: **Effective December 01, 2016, HDL Cholesterol**   reference interval will be changing to:                                   Female        Female                               5 - [phone]   40 - [phone]    VLDL Cholesterol Cal 46 (H) 5 - 40 mg/dL   LDL Calculated 20 0 - 99 mg/dL   Chol/HDL Ratio 2.8 0.0 - 4.4 ratio    Comment:                                   T. Chol/HDL Ratio                                              Men  Women  1/2 Avg.Risk  3.4    3.3                                   Avg.Risk  5.0    4.4                                2X Avg.Risk  9.6    7.1                                3X Avg.Risk 23.4   11.0   CBC     Status: None   Collection Time: 11/18/16  8:34 AM  Result Value Ref Range   WBC 5.3 3.4 - 10.8 x10E3/uL   RBC 4.29 3.77 - 5.28 x10E6/uL   Hemoglobin 13.7 11.1 - 15.9 g/dL   Hematocrit 40.2 34.0 - 46.6 %   MCV 94 79 - 97 fL   MCH 31.9 26.6 - 33.0 pg   MCHC 34.1 31.5 - 35.7 g/dL   RDW 13.4 12.3 - 15.4 %   Platelets 155 150 - 379 x10E3/uL  Comprehensive Metabolic Panel (CMET)     Status: Abnormal   Collection Time: 11/18/16  8:34 AM  Result Value Ref Range   Glucose 185 (H) 65 - 99 mg/dL   BUN 10 8 - 27 mg/dL   Creatinine, Ser 0.65 0.57 - 1.00 mg/dL   GFR calc non Af Amer 95 >59 mL/min/1.73   GFR calc Af Amer 110 >59 mL/min/1.73   BUN/Creatinine Ratio 15 12 - 28   Sodium 143 134 - 144 mmol/L   Potassium 4.0 3.5 - 5.2 mmol/L   Chloride 101 96 - 106 mmol/L   CO2 24 20 - 29 mmol/L   Calcium 9.1 8.7 - 10.3 mg/dL   Total Protein 6.6 6.0 - 8.5 g/dL   Albumin 4.0 3.6 - 4.8 g/dL   Globulin, Total 2.6 1.5 - 4.5 g/dL   Albumin/Globulin Ratio 1.5 1.2 - 2.2   Bilirubin Total 0.7 0.0 - 1.2 mg/dL   Alkaline Phosphatase 57 39 - 117 IU/L   AST 39 0 - 40 IU/L   ALT 35 (H) 0 - 32 IU/L  HgB A1c     Status: Abnormal   Collection Time: 11/18/16  8:34 AM  Result Value Ref Range   Hgb A1c MFr Bld 8.3 (H) 4.8 - 5.6 %    Comment:          Prediabetes: 5.7 - 6.4          Diabetes: >6.4          Glycemic control for adults with diabetes: <7.0    Est. average glucose Bld gHb Est-mCnc 192 mg/dL  POCT HgB A1C     Status: Abnormal   Collection Time: 12/16/16  8:48 AM  Result Value Ref Range   Hemoglobin A1C 7.9      Assessment & Plan  1. Cough - benzonatate (TESSALON PERLES) 100 MG capsule; Take 1 capsule (100 mg total) 3 (three) times daily  as needed by mouth.  Dispense: 20 capsule; Refill: 0 - CBC - Comprehensive metabolic panel  2. Bronchospasm - albuterol (PROVENTIL HFA;VENTOLIN HFA) 108 (90 Base) MCG/ACT inhaler; Inhale 2 puffs every 6 (six) hours as needed into the lungs for wheezing or shortness of breath.  Dispense:  1 Inhaler; Refill: 0  3. Nausea - Comprehensive metabolic panel - Previous ECG reviewed - pt had prolonged QT; we will avoid Zofran for the time being.  4. Diarrhea, unspecified type - CBC - Comprehensive metabolic panel - Food choices discussed at length with patient  5. Status post tooth extraction We will monitor - she will return in 2-3 days if not improving, sooner if worsening  6. Type 2 diabetes mellitus, uncontrolled, with neuropathy (HCC) - Comprehensive metabolic panel - POCT Glucose (CBG) - 179 today; continue medications; try to eat regular meals as tolerated  7. History of sepsis - CBC - Comprehensive metabolic panel  -Red flags and when to present for emergency care or RTC including fever >101.60F, chest pain, shortness of breath, new/worsening/un-resolving symptoms, abdominal pain, vomiting, ongoing diarrhea, reviewed with patient at time of visit. Follow up and care instructions discussed and provided in AVS.

## 2016-12-22 NOTE — Patient Instructions (Addendum)
Food Choices to Help Relieve Diarrhea, Adult When you have diarrhea, the foods you eat and your eating habits are very important. Choosing the right foods and drinks can help:  Relieve diarrhea.  Replace lost fluids and nutrients.  Prevent dehydration.  What general guidelines should I follow? Relieving diarrhea  Choose foods with less than 2 g or .07 oz. of fiber per serving.  Limit fats to less than 8 tsp (38 g or 1.34 oz.) a day.  Avoid the following: ? Foods and beverages sweetened with high-fructose corn syrup, honey, or sugar alcohols such as xylitol, sorbitol, and mannitol. ? Foods that contain a lot of fat or sugar. ? Fried, greasy, or spicy foods. ? High-fiber grains, breads, and cereals. ? Raw fruits and vegetables.  Eat foods that are rich in probiotics. These foods include dairy products such as yogurt and fermented milk products. They help increase healthy bacteria in the stomach and intestines (gastrointestinal tract, or GI tract).  If you have lactose intolerance, avoid dairy products. These may make your diarrhea worse.  Take medicine to help stop diarrhea (antidiarrheal medicine) only as told by your health care provider. Replacing nutrients  Eat small meals or snacks every 3-4 hours.  Eat bland foods, such as white rice, toast, or baked potato, until your diarrhea starts to get better. Gradually reintroduce nutrient-rich foods as tolerated or as told by your health care provider. This includes: ? Well-cooked protein foods. ? Peeled, seeded, and soft-cooked fruits and vegetables. ? Low-fat dairy products.  Take vitamin and mineral supplements as told by your health care provider. Preventing dehydration   Start by sipping water or a special solution to prevent dehydration (oral rehydration solution, ORS). Urine that is clear or pale yellow means that you are getting enough fluid.  Try to drink at least 8-10 cups of fluid each day to help replace lost  fluids.  You may add other liquids in addition to water, such as clear juice or decaffeinated sports drinks, as tolerated or as told by your health care provider.  Avoid drinks with caffeine, such as coffee, tea, or soft drinks.  Avoid alcohol. What foods are recommended? The items listed may not be a complete list. Talk with your health care provider about what dietary choices are best for you. Grains White rice. White, French, or pita breads (fresh or toasted), including plain rolls, buns, or bagels. White pasta. Saltine, soda, or graham crackers. Pretzels. Low-fiber cereal. Cooked cereals made with water (such as cornmeal, farina, or cream cereals). Plain muffins. Matzo. Melba toast. Zwieback. Vegetables Potatoes (without the skin). Most well-cooked and canned vegetables without skins or seeds. Tender lettuce. Fruits Apple sauce. Fruits canned in juice. Cooked apricots, cherries, grapefruit, peaches, pears, or plums. Fresh bananas and cantaloupe. Meats and other protein foods Baked or boiled chicken. Eggs. Tofu. Fish. Seafood. Smooth nut butters. Ground or well-cooked tender beef, ham, veal, lamb, pork, or poultry. Dairy Plain yogurt, kefir, and unsweetened liquid yogurt. Lactose-free milk, buttermilk, skim milk, or soy milk. Low-fat or nonfat hard cheese. Beverages Water. Low-calorie sports drinks. Fruit juices without pulp. Strained tomato and vegetable juices. Decaffeinated teas. Sugar-free beverages not sweetened with sugar alcohols. Oral rehydration solutions, if approved by your health care provider. Seasoning and other foods Bouillon, broth, or soups made from recommended foods. What foods are not recommended? The items listed may not be a complete list. Talk with your health care provider about what dietary choices are best for you. Grains Whole grain, whole wheat,   bran, or rye breads, rolls, pastas, and crackers. Wild or brown rice. Whole grain or bran cereals. Barley. Oats and  oatmeal. Corn tortillas or taco shells. Granola. Popcorn. Vegetables Raw vegetables. Fried vegetables. Cabbage, broccoli, Brussels sprouts, artichokes, baked beans, beet greens, corn, kale, legumes, peas, sweet potatoes, and yams. Potato skins. Cooked spinach and cabbage. Fruits Dried fruit, including raisins and dates. Raw fruits. Stewed or dried prunes. Canned fruits with syrup. Meat and other protein foods Fried or fatty meats. Deli meats. Chunky nut butters. Nuts and seeds. Beans and lentils. Tina Short. Hot dogs. Sausage. Dairy High-fat cheeses. Whole milk, chocolate milk, and beverages made with milk, such as milk shakes. Half-and-half. Cream. sour cream. Ice cream. Beverages Caffeinated beverages (such as coffee, tea, soda, or energy drinks). Alcoholic beverages. Fruit juices with pulp. Prune juice. Soft drinks sweetened with high-fructose corn syrup or sugar alcohols. High-calorie sports drinks. Fats and oils Butter. Cream sauces. Margarine. Salad oils. Plain salad dressings. Olives. Avocados. Mayonnaise. Sweets and desserts Sweet rolls, doughnuts, and sweet breads. Sugar-free desserts sweetened with sugar alcohols such as xylitol and sorbitol. Seasoning and other foods Honey. Hot sauce. Chili powder. Gravy. Cream-based or milk-based soups. Pancakes and waffles. Summary  When you have diarrhea, the foods you eat and your eating habits are very important.  Make sure you get at least 8-10 cups of fluid each day, or enough to keep your urine clear or pale yellow.  Eat bland foods and gradually reintroduce healthy, nutrient-rich foods as tolerated, or as told by your health care provider.  Avoid high-fiber, fried, greasy, or spicy foods. This information is not intended to replace advice given to you by your health care provider. Make sure you discuss any questions you have with your health care provider. Document Released: 04/19/2003 Document Revised: 01/25/2016 Document Reviewed:  01/25/2016 Elsevier Interactive Patient Education  2017 [employer]  Bronchospasm, Adult Bronchospasm is when airways in the lungs get smaller. When this happens, it can be hard to breathe. You may cough. You may also make a whistling sound when you breathe (wheeze). Follow these instructions at home: Medicines  Take over-the-counter and prescription medicines only as told by your doctor.  If you need to use an inhaler or nebulizer to take your medicine, ask your doctor how to use it.  If you were given a spacer, always use it with your inhaler. Lifestyle  Change your heating and air conditioning filter. Do this at least once a month.  Try not to use fireplaces and wood stoves.  Do not  smoke. Do not  allow smoking in your home.  Try not to use things that have a strong smell, like perfume.  Get rid of pests (such as roaches and mice) and their poop.  Remove any mold from your home.  Keep your house clean. Get rid of dust.  Use cleaning products that have no smell.  Replace carpet with wood, tile, or vinyl flooring.  Use allergy-proof pillows, mattress covers, and box spring covers.  Wash bed sheets and blankets every week. Use hot water. Dry them in a dryer.  Use blankets that are made of polyester or cotton.  Wash your hands often.  Keep pets out of your bedroom.  When you exercise, try not to breathe in cold air. General instructions  Have a plan for getting medical care. Know these things: ? When to call your doctor. ? When to call local emergency services (911 in the [place] ? Where to go in an emergency.  Stay up to date on your shots (immunizations).  When you have an episode: ? Stay calm. ? Relax. ? Breathe slowly. Contact a doctor if:  Your muscles ache.  Your chest hurts.  The color of the mucus you cough up (sputum) changes from clear or white to yellow, green, gray, or bloody.  The mucus you cough up gets thicker.  You have a  fever. Get help right away if:  The whistling sound gets worse, even after you take your medicines.  Your coughing gets worse.  You find it even harder to breathe.  Your chest hurts very much. Summary  Bronchospasm is when airways in the lungs get smaller.  When this happens, it can be hard to breathe. You may cough. You may also make a whistling sound when you breathe.  Stay away from things that cause you to have episodes. These include smoke or dust. This information is not intended to replace advice given to you by your health care provider. Make sure you discuss any questions you have with your health care provider. Document Released: 11/24/2008 Document Revised: 01/31/2016 Document Reviewed: 01/31/2016 Elsevier Interactive Patient Education  2017 [employer].

## 2016-12-22 NOTE — Progress Notes (Unsigned)
Patient stopped by the office today because she states she did not receive results from [facility]. 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

## 2016-12-23 ENCOUNTER — Telehealth: Payer: Self-pay | Admitting: *Deleted

## 2016-12-23 ENCOUNTER — Telehealth: Payer: Self-pay | Admitting: Family Medicine

## 2016-12-23 LAB — COMPREHENSIVE METABOLIC PANEL
ALK PHOS: 71 IU/L (ref 39–117)
ALT: 45 IU/L — ABNORMAL HIGH (ref 0–32)
AST: 61 IU/L — AB (ref 0–40)
Albumin/Globulin Ratio: 1.5 (ref 1.2–2.2)
Albumin: 4.3 g/dL (ref 3.6–4.8)
BILIRUBIN TOTAL: 0.7 mg/dL (ref 0.0–1.2)
BUN/Creatinine Ratio: 13 (ref 12–28)
BUN: 8 mg/dL (ref 8–27)
CO2: 19 mmol/L — AB (ref 20–29)
CREATININE: 0.63 mg/dL (ref 0.57–1.00)
Calcium: 9.3 mg/dL (ref 8.7–10.3)
Chloride: 102 mmol/L (ref 96–106)
GFR calc Af Amer: 111 mL/min/{1.73_m2} (ref >59)
GFR calc non Af Amer: 96 mL/min/{1.73_m2} (ref >59)
GLOBULIN, TOTAL: 2.9 g/dL (ref 1.5–4.5)
Glucose: 163 mg/dL — ABNORMAL HIGH (ref 65–99)
Potassium: 4.5 mmol/L (ref 3.5–5.2)
SODIUM: 142 mmol/L (ref 134–144)
Total Protein: 7.2 g/dL (ref 6.0–8.5)

## 2016-12-23 LAB — CBC
HEMATOCRIT: 41.9 % (ref 34.0–46.6)
Hemoglobin: 14.5 g/dL (ref 11.1–15.9)
MCH: 32.9 pg (ref 26.6–33.0)
MCHC: 34.6 g/dL (ref 31.5–35.7)
MCV: 95 fL (ref 79–97)
Platelets: 184 10*3/uL (ref 150–379)
RBC: 4.41 x10E6/uL (ref 3.77–5.28)
RDW: 13.7 % (ref 12.3–15.4)
WBC: 6.1 10*3/uL (ref 3.4–10.8)

## 2016-12-23 NOTE — Telephone Encounter (Signed)
Per note pt wants to hear what the labs are before making a follow up appt

## 2016-12-23 NOTE — Telephone Encounter (Signed)
Notified patient as instructed, occult fecal blood negative from 06-02-16, patient pleased.

## 2016-12-23 NOTE — Telephone Encounter (Signed)
Patient called back stating that someone called her about labs and that she didn't want to schedule an OV without hearing lab results       Copied from [place]. Topic: Quick Communication - Lab Results >> Dec 23, 2016 12:23 PM Hubbard Hartshorn, FNP wrote: Hulen Skains patient to review labs and to schedule appointment for tomorrow 12/24/2016 for re-check and labs. See result note from 12/23/2016 for details.

## 2016-12-24 ENCOUNTER — Telehealth: Payer: Self-pay | Admitting: Family Medicine

## 2016-12-24 ENCOUNTER — Ambulatory Visit: Payer: 59 | Admitting: Family Medicine

## 2016-12-24 ENCOUNTER — Other Ambulatory Visit: Payer: Self-pay

## 2016-12-24 ENCOUNTER — Encounter: Payer: Self-pay | Admitting: Family Medicine

## 2016-12-24 ENCOUNTER — Emergency Department
Admission: EM | Admit: 2016-12-24 | Discharge: 2016-12-24 | Disposition: A | Discharge status: Home / Self Care | Payer: 59 | Attending: Student in an Organized Health Care Education/Training Program | Admitting: Student in an Organized Health Care Education/Training Program

## 2016-12-24 VITALS — BP 136/72 | HR 90 | Temp 98.1°F | Resp 18 | Ht 68.0 in | Wt 288.2 lb

## 2016-12-24 DIAGNOSIS — Z7984 Long term (current) use of oral hypoglycemic drugs: Secondary | ICD-10-CM | POA: Insufficient documentation

## 2016-12-24 DIAGNOSIS — R1032 Left lower quadrant pain: Secondary | ICD-10-CM | POA: Diagnosis not present

## 2016-12-24 DIAGNOSIS — Z79899 Other long term (current) drug therapy: Secondary | ICD-10-CM | POA: Diagnosis not present

## 2016-12-24 DIAGNOSIS — R197 Diarrhea, unspecified: Secondary | ICD-10-CM | POA: Insufficient documentation

## 2016-12-24 DIAGNOSIS — Z8619 Personal history of other infectious and parasitic diseases: Secondary | ICD-10-CM

## 2016-12-24 DIAGNOSIS — IMO0002 Reserved for concepts with insufficient information to code with codable children: Secondary | ICD-10-CM

## 2016-12-24 DIAGNOSIS — Z7982 Long term (current) use of aspirin: Secondary | ICD-10-CM | POA: Insufficient documentation

## 2016-12-24 DIAGNOSIS — E119 Type 2 diabetes mellitus without complications: Secondary | ICD-10-CM | POA: Diagnosis not present

## 2016-12-24 DIAGNOSIS — R110 Nausea: Secondary | ICD-10-CM | POA: Diagnosis not present

## 2016-12-24 DIAGNOSIS — E1165 Type 2 diabetes mellitus with hyperglycemia: Secondary | ICD-10-CM | POA: Diagnosis not present

## 2016-12-24 DIAGNOSIS — J069 Acute upper respiratory infection, unspecified: Secondary | ICD-10-CM | POA: Diagnosis not present

## 2016-12-24 DIAGNOSIS — R0602 Shortness of breath: Secondary | ICD-10-CM | POA: Diagnosis present

## 2016-12-24 DIAGNOSIS — E1140 Type 2 diabetes mellitus with diabetic neuropathy, unspecified: Secondary | ICD-10-CM

## 2016-12-24 DIAGNOSIS — I10 Essential (primary) hypertension: Secondary | ICD-10-CM | POA: Diagnosis not present

## 2016-12-24 LAB — URINALYSIS, COMPLETE (UACMP) WITH MICROSCOPIC
Bacteria, UA: NONE SEEN
Bilirubin Urine: NEGATIVE
Glucose, UA: NEGATIVE mg/dL
HGB URINE DIPSTICK: NEGATIVE
KETONES UR: NEGATIVE mg/dL
Nitrite: NEGATIVE
PROTEIN: NEGATIVE mg/dL
Specific Gravity, Urine: 1.021 (ref 1.005–1.030)
pH: 5 (ref 5.0–8.0)

## 2016-12-24 LAB — CBC
HEMATOCRIT: 42 % (ref 35.0–47.0)
Hemoglobin: 14.2 g/dL (ref 12.0–16.0)
MCH: 31.6 pg (ref 26.0–34.0)
MCHC: 33.7 g/dL (ref 32.0–36.0)
MCV: 93.9 fL (ref 80.0–100.0)
PLATELETS: 169 10*3/uL (ref 150–440)
RBC: 4.48 MIL/uL (ref 3.80–5.20)
RDW: 12.8 % (ref 11.5–14.5)
WBC: 6.7 10*3/uL (ref 3.6–11.0)

## 2016-12-24 LAB — COMPREHENSIVE METABOLIC PANEL
ALT: 37 U/L (ref 14–54)
AST: 45 U/L — ABNORMAL HIGH (ref 15–41)
Albumin: 3.8 g/dL (ref 3.5–5.0)
Alkaline Phosphatase: 66 U/L (ref 38–126)
Anion gap: 10 (ref 5–15)
BUN: 10 mg/dL (ref 6–20)
CHLORIDE: 105 mmol/L (ref 101–111)
CO2: 26 mmol/L (ref 22–32)
Calcium: 9 mg/dL (ref 8.9–10.3)
Creatinine, Ser: 0.65 mg/dL (ref 0.44–1.00)
Glucose, Bld: 163 mg/dL — ABNORMAL HIGH (ref 65–99)
POTASSIUM: 4.4 mmol/L (ref 3.5–5.1)
Sodium: 141 mmol/L (ref 135–145)
Total Bilirubin: 1.2 mg/dL (ref 0.3–1.2)
Total Protein: 7.4 g/dL (ref 6.5–8.1)

## 2016-12-24 LAB — LIPASE, BLOOD: LIPASE: 29 U/L (ref 11–51)

## 2016-12-24 NOTE — Telephone Encounter (Signed)
Patient notified and came for appointment 12/24/2016

## 2016-12-24 NOTE — ED Provider Notes (Signed)
[facility] Emergency Department Provider Note    First MD Initiated Contact with Patient 12/24/16 1258     (approximate)  I have reviewed the triage vital signs and the nursing notes.   HISTORY  Chief Complaint Shortness of Breath and Abnormal Lab (liver enzyme and CO2)    HPI Tina Short is a 61 y.o. female with symptoms of an upper respiratory infection as well as persistent diarrhea after eating over the past several weeks presents for evaluation of abnormal blood tests done in clinic earlier this week.  Patient was reportedly found to have a CO2 level of 19 and mildly elevated liver enzymes.  Patient does have a history of sepsis secondary to pyelonephritis and due to concern for worsening infection and deterioration she was sent to the ER for further evaluation.  Patient states that she actually feels much improved today.  She is tolerating oral hydration.  No fevers.  Nonproductive cough.  No shortness of breath or weakness.  Past Medical History:  Diagnosis Date  . Depression   . Diabetes ([place])   . Hyperlipidemia   . Hypertension   . Neuropathy due to type 2 diabetes mellitus ([place])   . Obesity, morbid ([place]) 11/15/2009  . Sleep apnea    cpap  . Swelling    Family History  Problem Relation Age of Onset  . Diabetes Mother   . Cancer Mother        uterine  . Mental illness Father   . Obesity Daughter   . Diabetes Maternal Grandmother   . Heart disease Brother   . Obesity Daughter    Past Surgical History:  Procedure Laterality Date  . ABDOMINAL HYSTERECTOMY    . COLONOSCOPY    . FOOT SURGERY Right    X2  . SHOULDER SURGERY Right   . WRIST SURGERY Bilateral    Patient Active Problem List   Diagnosis Date Noted  . Sepsis ([place]) 08/10/2016  . Encounter for screening colonoscopy 04/30/2016  . Major depression, recurrent, chronic ([place]) 10/30/2015  . Dyslipidemia associated with type 2 diabetes mellitus ([place]) 10/30/2015  . Vitamin D  deficiency 07/27/2015  . Vitamin B12 deficiency 07/27/2015  . History of anemia 09/12/2014  . Carpal tunnel syndrome 09/12/2014  . Obstructive sleep apnea 09/12/2014  . Edema extremities 09/12/2014  . Gout 09/12/2014  . Extreme obesity 11/15/2009  . Benign essential HTN 10/07/2006  . Type 2 diabetes mellitus, uncontrolled, with neuropathy ([place]) 10/07/2006  . Hyperlipidemia 10/07/2006      Prior to Admission medications   Medication Sig Start Date End Date Taking? Authorizing Provider  ACCU-CHEK AVIVA PLUS test strip Test once daily 02/21/15   Ashok Norris, MD  albuterol (PROVENTIL HFA;VENTOLIN HFA) 108 (90 Base) MCG/ACT inhaler Inhale 2 puffs every 6 (six) hours as needed into the lungs for wheezing or shortness of breath. 12/22/16   Hubbard Hartshorn, FNP  allopurinol (ZYLOPRIM) 100 MG tablet Take 1 tablet (100 mg total) by mouth 2 (two) times daily. 11/27/16   Steele Sizer, MD  aspirin EC 81 MG tablet Take 1 tablet (81 mg total) by mouth daily. 08/27/16   Steele Sizer, MD  atorvastatin (LIPITOR) 40 MG tablet Take 1 tablet (40 mg total) by mouth at bedtime. 11/27/16   Steele Sizer, MD  benzonatate (TESSALON PERLES) 100 MG capsule Take 1 capsule (100 mg total) 3 (three) times daily as needed by mouth. 12/22/16   Hubbard Hartshorn, FNP  furosemide (LASIX) 40 MG tablet  Daily 11/27/16   Steele Sizer, MD  losartan (COZAAR) 100 MG tablet Take 1 tablet (100 mg total) by mouth daily. 11/27/16   Steele Sizer, MD  metFORMIN (GLUCOPHAGE-XR) 500 MG 24 hr tablet Take 1 tablet (500 mg total) by mouth 2 (two) times daily. 11/27/16   Steele Sizer, MD  Multiple Vitamin (MULTIVITAMIN) capsule Take 1 capsule by mouth daily.    [provider]  omega-3 acid ethyl esters (LOVAZA) 1 g capsule TAKE 2 CAPSULES BY MOUTH  TWO TIMES DAILY 09/12/16   Ancil Boozer, Drue Stager, MD  potassium chloride SA (K-DUR,KLOR-CON) 20 MEQ tablet Take 1 tablet (20 mEq total) by mouth daily. 11/27/16   Steele Sizer,  MD  Semaglutide (OZEMPIC) 0.25 or 0.5 MG/DOSE SOPN Inject 0.5 mg into the skin once a week. 11/27/16   Steele Sizer, MD  venlafaxine XR (EFFEXOR-XR) 150 MG 24 hr capsule One daily 11/27/16   Steele Sizer, MD  Vitamin D, Ergocalciferol, (DRISDOL) 50000 units CAPS capsule TAKE 1 CAPSULE BY MOUTH  EVERY 7 DAYS 12/16/16   Steele Sizer, MD    Allergies Abilify  [aripiprazole] and Invokana [canagliflozin]    Social History Social History   Tobacco Use  . Smoking status: Never Smoker  . Smokeless tobacco: Never Used  Substance Use Topics  . Alcohol use: No    Alcohol/week: 0.0 oz  . Drug use: No    Review of Systems Patient denies headaches, rhinorrhea, blurry vision, numbness, shortness of breath, chest pain, edema, cough, abdominal pain, nausea, vomiting, diarrhea, dysuria, fevers, rashes or hallucinations unless otherwise stated above in HPI. ____________________________________________   PHYSICAL EXAM:  VITAL SIGNS: Vitals:   12/24/16 0928  BP: (!) 148/65  Pulse: 83  Resp: 18  Temp: 98.2 F (36.8 C)  SpO2: 95%    Constitutional: Alert and oriented. Well appearing and in no acute distress. Eyes: Conjunctivae are normal.  Head: Atraumatic. Nose: No congestion/rhinnorhea. Mouth/Throat: Mucous membranes are moist.   Neck: No stridor. Painless ROM.  Cardiovascular: Normal rate, regular rhythm. Grossly normal heart sounds.  Good peripheral circulation. Respiratory: Normal respiratory effort.  No retractions. Lungs CTAB. Gastrointestinal: Soft and nontender. No distention. No abdominal bruits. No CVA tenderness. Genitourinary:  Musculoskeletal: No lower extremity tenderness nor edema.  No joint effusions. Neurologic:  Normal speech and language. No gross focal neurologic deficits are appreciated. No facial droop Skin:  Skin is warm, dry and intact. No rash noted. Psychiatric: Mood and affect are normal. Speech and behavior are  normal.  ____________________________________________   LABS (all labs ordered are listed, but only abnormal results are displayed)  Results for orders placed or performed during the hospital encounter of 12/24/16 (from the past 24 hour(s))  Lipase, blood     Status: None   Collection Time: 12/24/16  9:31 AM  Result Value Ref Range   Lipase 29 11 - 51 U/L  Comprehensive metabolic panel     Status: Abnormal   Collection Time: 12/24/16  9:31 AM  Result Value Ref Range   Sodium 141 135 - 145 mmol/L   Potassium 4.4 3.5 - 5.1 mmol/L   Chloride 105 101 - 111 mmol/L   CO2 26 22 - 32 mmol/L   Glucose, Bld 163 (H) 65 - 99 mg/dL   BUN 10 6 - 20 mg/dL   Creatinine, Ser 0.65 0.44 - 1.00 mg/dL   Calcium 9.0 8.9 - 10.3 mg/dL   Total Protein 7.4 6.5 - 8.1 g/dL   Albumin 3.8 3.5 - 5.0 g/dL  AST 45 (H) 15 - 41 U/L   ALT 37 14 - 54 U/L   Alkaline Phosphatase 66 38 - 126 U/L   Total Bilirubin 1.2 0.3 - 1.2 mg/dL   GFR calc non Af Amer >60 >60 mL/min   GFR calc Af Amer >60 >60 mL/min   Anion gap 10 5 - 15  CBC     Status: None   Collection Time: 12/24/16  9:31 AM  Result Value Ref Range   WBC 6.7 3.6 - 11.0 K/uL   RBC 4.48 3.80 - 5.20 MIL/uL   Hemoglobin 14.2 12.0 - 16.0 g/dL   HCT 42.0 35.0 - 47.0 %   MCV 93.9 80.0 - 100.0 fL   MCH 31.6 26.0 - 34.0 pg   MCHC 33.7 32.0 - 36.0 g/dL   RDW 12.8 11.5 - 14.5 %   Platelets 169 150 - 440 K/uL  Urinalysis, Complete w Microscopic     Status: Abnormal   Collection Time: 12/24/16  9:31 AM  Result Value Ref Range   Color, Urine YELLOW (A) YELLOW   APPearance HAZY (A) CLEAR   Specific Gravity, Urine 1.021 1.005 - 1.030   pH 5.0 5.0 - 8.0   Glucose, UA NEGATIVE NEGATIVE mg/dL   Hgb urine dipstick NEGATIVE NEGATIVE   Bilirubin Urine NEGATIVE NEGATIVE   Ketones, ur NEGATIVE NEGATIVE mg/dL   Protein, ur NEGATIVE NEGATIVE mg/dL   Nitrite NEGATIVE NEGATIVE   Leukocytes, UA MODERATE (A) NEGATIVE   RBC / HPF 0-5 0 - 5 RBC/hpf   WBC, UA 6-30 0 - 5  WBC/hpf   Bacteria, UA NONE SEEN NONE SEEN   Squamous Epithelial / LPF 0-5 (A) NONE SEEN   Mucus PRESENT    ____________________________________________ ________________________________   PROCEDURES  Procedure(s) performed:  Procedures    Critical Care performed: no ____________________________________________   INITIAL IMPRESSION / ASSESSMENT AND PLAN / ED COURSE  Pertinent labs & imaging results that were available during my care of the patient were reviewed by me and considered in my medical decision making (see chart for details).  DDX: dehydration, uri, ili, pna, enteritis, appendicitis  Tina Short is a 61 y.o. who presents to the ED with above described symptoms.  Blood work repeated for the above differential shows normalized levels.  Patient in no acute distress.  Her abdominal exam is soft and benign.  She has no signs or symptoms of pneumonia or sepsis.  Able to ambulate with a steady gait without any shortness of breath or desaturations.  Have offered IV fluids but the patient has declined this stating that she is eating and drinking just fine.  At this point I do believe that she is stable and appropriate for follow-up with her PCP.      ____________________________________________   FINAL CLINICAL IMPRESSION(S) / ED DIAGNOSES  Final diagnoses:  Diarrhea, unspecified type  Upper respiratory tract infection, unspecified type      NEW MEDICATIONS STARTED DURING THIS VISIT:  This SmartLink is deprecated. Use AVSMEDLIST instead to display the medication list for a patient.   Note:  This document was prepared using Dragon voice recognition software and may include unintentional dictation errors.    Merlyn Lot, MD 12/24/16 1324

## 2016-12-24 NOTE — Telephone Encounter (Signed)
Please call patient and ask how she is doing after her ER visit.  I would like to offer the option of stool culture to determine if her diarrhea is infectious in nature.  If she would like to have this testing performed, please let me know and I will place the order so that she may pick up a kit from our office.  Thanks!

## 2016-12-24 NOTE — Patient Instructions (Signed)
Please go directly to [facility] ER for further evaluation.  

## 2016-12-24 NOTE — ED Notes (Addendum)
Pt c/o diarrhea "for a while" r/t metformin - pt c/o congested cough since Friday (productive - green mucus) - provider at bedside

## 2016-12-24 NOTE — ED Triage Notes (Signed)
Pt sent from PCP at [facility], states she was seen there on Monday with diarrhea and URI complaints, states they called her back today and advised her to come to the ED for further testing due to abnormal liver enzymes and CO2 levels.the patient is in NAD, denies any pain, states she just feels, "blah".Marland Kitchen

## 2016-12-24 NOTE — Progress Notes (Signed)
Name: Tina Short   MRN: [id number]    DOB: [birth year]   Date:12/24/2016       Progress Note  Subjective  Chief Complaint  Chief Complaint  Patient presents with  . Follow-up    labwork     HPI  Patient presents to follow up on lab work, URI, and diarrhea. Her nasal congestion and cough have improved, has not had any bronchospasms since her last visit - symptom management medications have been working well.  She is feeling more weak and fatigued than she did at the last visit.  Still having diarrhea and nausea - she has had 6+ episodes of diarrhea in the last 24 hours with LLQ abdominal pain and decreased appetite; no vomiting.  Colonoscopy was performed 05/14/2016 - Diverticulosis was found on examination. - Abnormal Labs: CO2 was slightly low at 19, AST 61, ALT 45. We will recheck today.  She has a history of sepsis in July and says she doesn't think her body has totally recovered from this. - Denies episodes of confusion, chest pain, shortness of breath, fevers or chills, lightheadedness, or headache.  Patient Active Problem List   Diagnosis Date Noted  . Sepsis ([place]) 08/10/2016  . Encounter for screening colonoscopy 04/30/2016  . Major depression, recurrent, chronic ([place]) 10/30/2015  . Dyslipidemia associated with type 2 diabetes mellitus ([place]) 10/30/2015  . Vitamin D deficiency 07/27/2015  . Vitamin B12 deficiency 07/27/2015  . History of anemia 09/12/2014  . Carpal tunnel syndrome 09/12/2014  . Obstructive sleep apnea 09/12/2014  . Edema extremities 09/12/2014  . Gout 09/12/2014  . Extreme obesity 11/15/2009  . Benign essential HTN 10/07/2006  . Type 2 diabetes mellitus, uncontrolled, with neuropathy ([place]) 10/07/2006  . Hyperlipidemia 10/07/2006    Social History   Tobacco Use  . Smoking status: Never Smoker  . Smokeless tobacco: Never Used  Substance Use Topics  . Alcohol use: No    Alcohol/week: 0.0 oz     Current Outpatient Medications:  .  ACCU-CHEK AVIVA  PLUS test strip, Test once daily, Disp: 100 each, Rfl: 11 .  albuterol (PROVENTIL HFA;VENTOLIN HFA) 108 (90 Base) MCG/ACT inhaler, Inhale 2 puffs every 6 (six) hours as needed into the lungs for wheezing or shortness of breath., Disp: 1 Inhaler, Rfl: 0 .  allopurinol (ZYLOPRIM) 100 MG tablet, Take 1 tablet (100 mg total) by mouth 2 (two) times daily., Disp: 180 tablet, Rfl: 0 .  aspirin EC 81 MG tablet, Take 1 tablet (81 mg total) by mouth daily., Disp: 30 tablet, Rfl: 0 .  atorvastatin (LIPITOR) 40 MG tablet, Take 1 tablet (40 mg total) by mouth at bedtime., Disp: 90 tablet, Rfl: 1 .  benzonatate (TESSALON PERLES) 100 MG capsule, Take 1 capsule (100 mg total) 3 (three) times daily as needed by mouth., Disp: 20 capsule, Rfl: 0 .  furosemide (LASIX) 40 MG tablet, Daily, Disp: 90 tablet, Rfl: 1 .  losartan (COZAAR) 100 MG tablet, Take 1 tablet (100 mg total) by mouth daily., Disp: 90 tablet, Rfl: 1 .  metFORMIN (GLUCOPHAGE-XR) 500 MG 24 hr tablet, Take 1 tablet (500 mg total) by mouth 2 (two) times daily., Disp: 180 tablet, Rfl: 1 .  Multiple Vitamin (MULTIVITAMIN) capsule, Take 1 capsule by mouth daily., Disp: , Rfl:  .  omega-3 acid ethyl esters (LOVAZA) 1 g capsule, TAKE 2 CAPSULES BY MOUTH  TWO TIMES DAILY, Disp: 360 capsule, Rfl: 1 .  potassium chloride SA (K-DUR,KLOR-CON) 20 MEQ tablet, Take 1 tablet (20 mEq  total) by mouth daily., Disp: 90 tablet, Rfl: 1 .  Semaglutide (OZEMPIC) 0.25 or 0.5 MG/DOSE SOPN, Inject 0.5 mg into the skin once a week., Disp: 9 mL, Rfl: 1 .  venlafaxine XR (EFFEXOR-XR) 150 MG 24 hr capsule, One daily, Disp: 90 capsule, Rfl: 1 .  Vitamin D, Ergocalciferol, (DRISDOL) 50000 units CAPS capsule, TAKE 1 CAPSULE BY MOUTH  EVERY 7 DAYS, Disp: 12 capsule, Rfl: 1  Allergies  Allergen Reactions  . Abilify  [Aripiprazole]     Other reaction(s): Difficulty breathing  . Invokana [Canagliflozin] Itching    Yeast infections    ROS  Ten systems reviewed and is negative except  as mentioned in HPI  Objective  Vitals:   12/24/16 0829  BP: 136/72  Pulse: 90  Resp: 18  Temp: 98.1 F (36.7 C)  TempSrc: Oral  SpO2: 93%  Weight: 288 lb 3.2 oz (130.7 kg)  Height: '5\' 8"'  (1.727 m)    Body mass index is 43.82 kg/m.  Nursing Note and Vital Signs reviewed.  Physical Exam  Constitutional: Patient appears well-developed and well-nourished. Obese. No distress.  HEENT: head atraumatic, normocephalic, pupils equal and reactive to light, EOM's intact Cardiovascular: Normal rate, regular rhythm, S1/S2 present.  No murmur or rub heard. No BLE edema. Pulmonary/Chest: Effort normal and breath sounds clear. No respiratory distress or retractions. Abdominal: Soft; LLQ tender, no HSM, bowel sounds present x4 quadrants.  No CVA Tenderness Psychiatric: Patient has a normal mood and affect. behavior is normal. Judgment and thought content normal.   Recent Results (from the past 2160 hour(s))  Lipid Profile     Status: Abnormal   Collection Time: 11/18/16  8:34 AM  Result Value Ref Range   Cholesterol, Total 102 100 - 199 mg/dL   Triglycerides 232 (H) 0 - 149 mg/dL   HDL 36 (L) >39 mg/dL    Comment: **Effective December 01, 2016, HDL Cholesterol**   reference interval will be changing to:                                   Female        Female                               78 - [id number]   82 - [id number]    VLDL Cholesterol Cal 46 (H) 5 - 40 mg/dL   LDL Calculated 20 0 - 99 mg/dL   Chol/HDL Ratio 2.8 0.0 - 4.4 ratio    Comment:                                   T. Chol/HDL Ratio                                             Men  Women                               1/2 Avg.Risk  3.4    3.3  Avg.Risk  5.0    4.4                                2X Avg.Risk  9.6    7.1                                3X Avg.Risk 23.4   11.0   CBC     Status: None   Collection Time: 11/18/16  8:34 AM  Result Value Ref Range   WBC 5.3 3.4 - 10.8 x10E3/uL   RBC 4.29  3.77 - 5.28 x10E6/uL   Hemoglobin 13.7 11.1 - 15.9 g/dL   Hematocrit 40.2 34.0 - 46.6 %   MCV 94 79 - 97 fL   MCH 31.9 26.6 - 33.0 pg   MCHC 34.1 31.5 - 35.7 g/dL   RDW 13.4 12.3 - 15.4 %   Platelets 155 150 - 379 x10E3/uL  Comprehensive Metabolic Panel (CMET)     Status: Abnormal   Collection Time: 11/18/16  8:34 AM  Result Value Ref Range   Glucose 185 (H) 65 - 99 mg/dL   BUN 10 8 - 27 mg/dL   Creatinine, Ser 0.65 0.57 - 1.00 mg/dL   GFR calc non Af Amer 95 >59 mL/min/1.73   GFR calc Af Amer 110 >59 mL/min/1.73   BUN/Creatinine Ratio 15 12 - 28   Sodium 143 134 - 144 mmol/L   Potassium 4.0 3.5 - 5.2 mmol/L   Chloride 101 96 - 106 mmol/L   CO2 24 20 - 29 mmol/L   Calcium 9.1 8.7 - 10.3 mg/dL   Total Protein 6.6 6.0 - 8.5 g/dL   Albumin 4.0 3.6 - 4.8 g/dL   Globulin, Total 2.6 1.5 - 4.5 g/dL   Albumin/Globulin Ratio 1.5 1.2 - 2.2   Bilirubin Total 0.7 0.0 - 1.2 mg/dL   Alkaline Phosphatase 57 39 - 117 IU/L   AST 39 0 - 40 IU/L   ALT 35 (H) 0 - 32 IU/L  HgB A1c     Status: Abnormal   Collection Time: 11/18/16  8:34 AM  Result Value Ref Range   Hgb A1c MFr Bld 8.3 (H) 4.8 - 5.6 %    Comment:          Prediabetes: 5.7 - 6.4          Diabetes: >6.4          Glycemic control for adults with diabetes: <7.0    Est. average glucose Bld gHb Est-mCnc 192 mg/dL  POCT HgB A1C     Status: Abnormal   Collection Time: 12/16/16  8:48 AM  Result Value Ref Range   Hemoglobin A1C 7.9   CBC     Status: None   Collection Time: 12/22/16 12:00 AM  Result Value Ref Range   WBC 6.1 3.4 - 10.8 x10E3/uL   RBC 4.41 3.77 - 5.28 x10E6/uL   Hemoglobin 14.5 11.1 - 15.9 g/dL   Hematocrit 41.9 34.0 - 46.6 %   MCV 95 79 - 97 fL   MCH 32.9 26.6 - 33.0 pg   MCHC 34.6 31.5 - 35.7 g/dL   RDW 13.7 12.3 - 15.4 %   Platelets 184 150 - 379 x10E3/uL  Comprehensive metabolic panel     Status: Abnormal   Collection Time: 12/22/16 12:00 AM  Result Value Ref Range   Glucose 163 (  H) 65 - 99 mg/dL     Comment: Specimen received hemolyzed. Clinical correlation indicated.   BUN 8 8 - 27 mg/dL   Creatinine, Ser 0.63 0.57 - 1.00 mg/dL   GFR calc non Af Amer 96 >59 mL/min/1.73   GFR calc Af Amer 111 >59 mL/min/1.73   BUN/Creatinine Ratio 13 12 - 28   Sodium 142 134 - 144 mmol/L   Potassium 4.5 3.5 - 5.2 mmol/L    Comment: Specimen received hemolyzed. Clinical correlation indicated.   Chloride 102 96 - 106 mmol/L   CO2 19 (L) 20 - 29 mmol/L   Calcium 9.3 8.7 - 10.3 mg/dL   Total Protein 7.2 6.0 - 8.5 g/dL   Albumin 4.3 3.6 - 4.8 g/dL   Globulin, Total 2.9 1.5 - 4.5 g/dL   Albumin/Globulin Ratio 1.5 1.2 - 2.2   Bilirubin Total 0.7 0.0 - 1.2 mg/dL   Alkaline Phosphatase 71 39 - 117 IU/L   AST 61 (H) 0 - 40 IU/L   ALT 45 (H) 0 - 32 IU/L  POCT Glucose (CBG)     Status: Abnormal   Collection Time: 12/22/16 11:36 AM  Result Value Ref Range   POC Glucose 179 (A) 70 - 99 mg/dl  Lipase, blood     Status: None   Collection Time: 12/24/16  9:31 AM  Result Value Ref Range   Lipase 29 11 - 51 U/L  Comprehensive metabolic panel     Status: Abnormal   Collection Time: 12/24/16  9:31 AM  Result Value Ref Range   Sodium 141 135 - 145 mmol/L   Potassium 4.4 3.5 - 5.1 mmol/L   Chloride 105 101 - 111 mmol/L   CO2 26 22 - 32 mmol/L   Glucose, Bld 163 (H) 65 - 99 mg/dL   BUN 10 6 - 20 mg/dL   Creatinine, Ser 0.65 0.44 - 1.00 mg/dL   Calcium 9.0 8.9 - 10.3 mg/dL   Total Protein 7.4 6.5 - 8.1 g/dL   Albumin 3.8 3.5 - 5.0 g/dL   AST 45 (H) 15 - 41 U/L   ALT 37 14 - 54 U/L   Alkaline Phosphatase 66 38 - 126 U/L   Total Bilirubin 1.2 0.3 - 1.2 mg/dL   GFR calc non Af Amer >60 >60 mL/min   GFR calc Af Amer >60 >60 mL/min    Comment: (NOTE) The eGFR has been calculated using the CKD EPI equation. This calculation has not been validated in all clinical situations. eGFR's persistently <60 mL/min signify possible Chronic Kidney Disease.    Anion gap 10 5 - 15  CBC     Status: None   Collection  Time: 12/24/16  9:31 AM  Result Value Ref Range   WBC 6.7 3.6 - 11.0 K/uL   RBC 4.48 3.80 - 5.20 MIL/uL   Hemoglobin 14.2 12.0 - 16.0 g/dL   HCT 42.0 35.0 - 47.0 %   MCV 93.9 80.0 - 100.0 fL   MCH 31.6 26.0 - 34.0 pg   MCHC 33.7 32.0 - 36.0 g/dL   RDW 12.8 11.5 - 14.5 %   Platelets 169 150 - 440 K/uL  Urinalysis, Complete w Microscopic     Status: Abnormal   Collection Time: 12/24/16  9:31 AM  Result Value Ref Range   Color, Urine YELLOW (A) YELLOW   APPearance HAZY (A) CLEAR   Specific Gravity, Urine 1.021 1.005 - 1.030   pH 5.0 5.0 - 8.0   Glucose, UA NEGATIVE NEGATIVE  mg/dL   Hgb urine dipstick NEGATIVE NEGATIVE   Bilirubin Urine NEGATIVE NEGATIVE   Ketones, ur NEGATIVE NEGATIVE mg/dL   Protein, ur NEGATIVE NEGATIVE mg/dL   Nitrite NEGATIVE NEGATIVE   Leukocytes, UA MODERATE (A) NEGATIVE   RBC / HPF 0-5 0 - 5 RBC/hpf   WBC, UA 6-30 0 - 5 WBC/hpf   Bacteria, UA NONE SEEN NONE SEEN   Squamous Epithelial / LPF 0-5 (A) NONE SEEN   Mucus PRESENT      Assessment & Plan  1. Diarrhea, unspecified type 2. LLQ abdominal pain 3. Nausea 4. Type 2 diabetes mellitus, uncontrolled, with neuropathy ([place]) 5. History of sepsis  - Due to concerns for prior admission for sepsis, low CO2, ongoing diarrhea, and significant fatigue, I advised, after consultation with Dr. Ancil Boozer, PCP, that patient should present for Emergency Care.  We discussed alternative of stat labs and further investigation as an outpatient, but patient is more agreeable to present to ER.  Report is called to [facility] for further evaluation.

## 2016-12-25 ENCOUNTER — Telehealth: Payer: Self-pay | Admitting: Emergency Medicine

## 2016-12-25 NOTE — Telephone Encounter (Signed)
Patient called to let you know that she is feeling much better after ER visit. She was  dehydrated. ER told her she was ok. She is back at work today,

## 2016-12-25 NOTE — Telephone Encounter (Signed)
Left message for patient to call office  at each number on file

## 2016-12-25 NOTE — Telephone Encounter (Signed)
Documentation reviewed 

## 2016-12-30 ENCOUNTER — Telehealth: Payer: Self-pay | Admitting: Family Medicine

## 2016-12-30 NOTE — Telephone Encounter (Signed)
Copied from [phone] [id number]. Topic: Quick Communication - See Telephone Encounter >> Dec 30, 2016  2:53 PM Cleaster Corin, NT wrote: CRM for notification. See Telephone encounter for:   12/30/16. Pt. Called to see which rx. Was correct for taking med. Benzonatate 150 mg. Caps take 1 a day or take 2 a day please call pt. To let her know pt. Michela Pitcher leave a voicemail if no answer [phone]

## 2016-12-30 NOTE — Telephone Encounter (Signed)
Left a voicemail to inform patient she is able to take 1 capsule up to 3 times daily.

## 2017-01-13 ENCOUNTER — Other Ambulatory Visit: Payer: Self-pay | Admitting: Family Medicine

## 2017-01-13 DIAGNOSIS — J9801 Acute bronchospasm: Secondary | ICD-10-CM

## 2017-01-13 NOTE — Telephone Encounter (Signed)
Per the request of Raelyn Ensign, FNP  I contacted this patient to see how she was doing, but there was no answer. A message was left for her asking how she was doing and to let her know that the requested medication was only meant to be used for a short term so if she is needing more then she will need to f/u with Dr. Ancil Boozer.  Patient was asked to give our office a call asap.

## 2017-01-13 NOTE — Telephone Encounter (Signed)
Please call patient and ask how she is doing - I gave her an albuterol inhaler on 12/22/2016 for URI symptoms, this was not meant to be a long-term medication. Is she doing better now?  If not, she needs to come in for follow up with Dr. Ancil Boozer a bit sooner than January if possible.

## 2017-01-20 ENCOUNTER — Other Ambulatory Visit: Payer: Self-pay | Admitting: Family Medicine

## 2017-01-20 DIAGNOSIS — F339 Major depressive disorder, recurrent, unspecified: Secondary | ICD-10-CM

## 2017-02-12 ENCOUNTER — Telehealth: Payer: Self-pay | Admitting: Family Medicine

## 2017-02-12 DIAGNOSIS — F339 Major depressive disorder, recurrent, unspecified: Secondary | ICD-10-CM

## 2017-02-12 MED ORDER — VENLAFAXINE HCL ER 150 MG PO CP24: ORAL_CAPSULE | ORAL | 0 refills | Status: DC

## 2017-02-12 NOTE — Telephone Encounter (Signed)
Called patient regarding which pharmacy she wanted to use for her Effexor, no answer, left message for her to call the office.

## 2017-02-12 NOTE — Telephone Encounter (Signed)
Copied from [place] [id number]. Topic: Quick Communication - See Telephone Encounter >> Feb 12, 2017  9:36 AM Cleaster Corin, NT wrote: CRM for notification. See Telephone encounter for:   02/12/17. Pt. Calling to get refill on med. Venlafaxine XR (90 day taking 3x a day) pt. Can be reached at [phone] pt. Asks that someone gives her a call if able to get refill or not. Please leave vm if no answer

## 2017-02-12 NOTE — Telephone Encounter (Signed)
Pt called back:     [facility] [place], [place] [phone] (Phone) [phone] (Fax)

## 2017-02-12 NOTE — Telephone Encounter (Signed)
Called to inquire which pharmacy patient would like Effexor to go too. Either [facility] or [facility].

## 2017-02-17 ENCOUNTER — Encounter: Payer: Self-pay | Admitting: Family Medicine

## 2017-02-17 ENCOUNTER — Ambulatory Visit: Payer: 59 | Admitting: Family Medicine

## 2017-02-17 VITALS — BP 122/66 | HR 96 | Resp 16 | Ht 68.0 in | Wt 288.8 lb

## 2017-02-17 DIAGNOSIS — E782 Mixed hyperlipidemia: Secondary | ICD-10-CM | POA: Diagnosis not present

## 2017-02-17 DIAGNOSIS — E1165 Type 2 diabetes mellitus with hyperglycemia: Secondary | ICD-10-CM

## 2017-02-17 DIAGNOSIS — F339 Major depressive disorder, recurrent, unspecified: Secondary | ICD-10-CM

## 2017-02-17 DIAGNOSIS — M109 Gout, unspecified: Secondary | ICD-10-CM

## 2017-02-17 DIAGNOSIS — I10 Essential (primary) hypertension: Secondary | ICD-10-CM

## 2017-02-17 DIAGNOSIS — R600 Localized edema: Secondary | ICD-10-CM

## 2017-02-17 DIAGNOSIS — E1129 Type 2 diabetes mellitus with other diabetic kidney complication: Secondary | ICD-10-CM

## 2017-02-17 DIAGNOSIS — R809 Proteinuria, unspecified: Secondary | ICD-10-CM

## 2017-02-17 DIAGNOSIS — E1140 Type 2 diabetes mellitus with diabetic neuropathy, unspecified: Secondary | ICD-10-CM | POA: Diagnosis not present

## 2017-02-17 DIAGNOSIS — IMO0002 Reserved for concepts with insufficient information to code with codable children: Secondary | ICD-10-CM

## 2017-02-17 LAB — POCT GLYCOSYLATED HEMOGLOBIN (HGB A1C): HEMOGLOBIN A1C: 8.6

## 2017-02-17 MED ORDER — ATORVASTATIN CALCIUM 40 MG PO TABS: 40.0000 mg | ORAL_TABLET | Freq: Every day | ORAL | 1 refills | Status: DC

## 2017-02-17 MED ORDER — SEMAGLUTIDE (1 MG/DOSE) 2 MG/1.5ML ~~LOC~~ SOPN: 1.0000 mg | PEN_INJECTOR | SUBCUTANEOUS | 1 refills | Status: DC

## 2017-02-17 MED ORDER — FUROSEMIDE 40 MG PO TABS: ORAL_TABLET | ORAL | 1 refills | Status: DC

## 2017-02-17 MED ORDER — POTASSIUM CHLORIDE CRYS ER 20 MEQ PO TBCR: 20.0000 meq | EXTENDED_RELEASE_TABLET | Freq: Every day | ORAL | 1 refills | Status: DC

## 2017-02-17 MED ORDER — LOSARTAN POTASSIUM 100 MG PO TABS: 100.0000 mg | ORAL_TABLET | Freq: Every day | ORAL | 1 refills | Status: DC

## 2017-02-17 MED ORDER — VENLAFAXINE HCL ER 150 MG PO CP24: 300.0000 mg | ORAL_CAPSULE | Freq: Every day | ORAL | 1 refills | Status: DC

## 2017-02-17 MED ORDER — ALLOPURINOL 100 MG PO TABS: 100.0000 mg | ORAL_TABLET | Freq: Two times a day (BID) | ORAL | 0 refills | Status: DC

## 2017-02-17 MED ORDER — OMEGA-3-ACID ETHYL ESTERS 1 G PO CAPS: ORAL_CAPSULE | ORAL | 1 refills | Status: DC

## 2017-02-17 MED ORDER — METFORMIN HCL ER 500 MG PO TB24: 500.0000 mg | ORAL_TABLET | Freq: Two times a day (BID) | ORAL | 0 refills | Status: DC
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
## 2017-02-24 ENCOUNTER — Other Ambulatory Visit: Payer: Self-pay | Admitting: Family Medicine

## 2017-02-24 DIAGNOSIS — E782 Mixed hyperlipidemia: Secondary | ICD-10-CM

## 2017-03-08 ENCOUNTER — Other Ambulatory Visit: Payer: Self-pay | Admitting: Family Medicine

## 2017-03-08 DIAGNOSIS — M109 Gout, unspecified: Secondary | ICD-10-CM

## 2017-03-09 NOTE — Telephone Encounter (Signed)
Refill request for general medication: Allopurinol to [facility].   Last office visit: 02/17/2017   Follow up 05/19/2017

## 2017-04-27 ENCOUNTER — Ambulatory Visit: Payer: Managed Care, Other (non HMO) | Admitting: Nurse Practitioner

## 2017-04-27 ENCOUNTER — Encounter: Payer: Self-pay | Admitting: Nurse Practitioner

## 2017-04-27 VITALS — BP 122/74 | HR 73 | Temp 98.7°F | Resp 16 | Wt 284.0 lb

## 2017-04-27 DIAGNOSIS — R05 Cough: Secondary | ICD-10-CM

## 2017-04-27 DIAGNOSIS — J40 Bronchitis, not specified as acute or chronic: Secondary | ICD-10-CM | POA: Diagnosis not present

## 2017-04-27 DIAGNOSIS — R059 Cough, unspecified: Secondary | ICD-10-CM

## 2017-04-27 MED ORDER — PROMETHAZINE-DM 6.25-15 MG/5ML PO SYRP: 2.5000 mL | ORAL_SOLUTION | Freq: Every evening | ORAL | 0 refills | Status: DC | PRN

## 2017-04-27 MED ORDER — ALBUTEROL SULFATE HFA 108 (90 BASE) MCG/ACT IN AERS: 2.0000 | INHALATION_SPRAY | Freq: Four times a day (QID) | RESPIRATORY_TRACT | 0 refills | Status: DC | PRN

## 2017-04-27 MED ORDER — BENZONATATE 100 MG PO CAPS: 100.0000 mg | ORAL_CAPSULE | Freq: Two times a day (BID) | ORAL | 0 refills | Status: DC | PRN

## 2017-04-27 NOTE — Progress Notes (Signed)
Name: Tina Short   MRN: [id number]    DOB: 11-Oct-1955   Date:04/27/2017       Progress Note  Subjective  Chief Complaint  Chief Complaint  Patient presents with  . URI    onset 1 week symptoms include, cough, chest congestion and headache    HPI  Cough Patient endorses cough started last week disturbs sleep. Patient sts goes through periods of dry cough then the next day will produce copious amounts sputum. Endorses chest congestion, sinus pain, rhinorrhea, headaches, endorses hoarseness. Pt endorses mild chills denies fevers. Denies sore throat, ear pain, muffled noises, sneezing. Pt endorses shob with coughing fits and occasional wheezing- has been taking albuterol inhaler with some relief. Taking musinex, tylenol- mild relief.  Positive sick contacts- 10 family members sick with similar illness. Grandkids were diagnosed with flu last week.   Patient Active Problem List   Diagnosis Date Noted  . Sepsis ([place]) 08/10/2016  . Encounter for screening colonoscopy 04/30/2016  . Major depression, recurrent, chronic ([place]) 10/30/2015  . Dyslipidemia associated with type 2 diabetes mellitus ([place]) 10/30/2015  . Vitamin D deficiency 07/27/2015  . Vitamin B12 deficiency 07/27/2015  . History of anemia 09/12/2014  . Carpal tunnel syndrome 09/12/2014  . Obstructive sleep apnea 09/12/2014  . Edema extremities 09/12/2014  . Gout 09/12/2014  . Extreme obesity 11/15/2009  . Benign essential HTN 10/07/2006  . Type 2 diabetes mellitus, uncontrolled, with neuropathy ([place]) 10/07/2006  . Hyperlipidemia 10/07/2006    Social History   Tobacco Use  . Smoking status: Never Smoker  . Smokeless tobacco: Never Used  Substance Use Topics  . Alcohol use: No    Alcohol/week: 0.0 oz     Current Outpatient Medications:  .  albuterol (PROVENTIL HFA;VENTOLIN HFA) 108 (90 Base) MCG/ACT inhaler, Inhale 2 puffs every 6 (six) hours as needed into the lungs for wheezing or shortness of breath., Disp: 1  Inhaler, Rfl: 0 .  allopurinol (ZYLOPRIM) 100 MG tablet, Take 1 tablet (100 mg total) by mouth 2 (two) times daily., Disp: 180 tablet, Rfl: 0 .  atorvastatin (LIPITOR) 40 MG tablet, Take 1 tablet (40 mg total) by mouth at bedtime., Disp: 90 tablet, Rfl: 1 .  furosemide (LASIX) 40 MG tablet, Daily, Disp: 90 tablet, Rfl: 1 .  losartan (COZAAR) 100 MG tablet, Take 1 tablet (100 mg total) by mouth daily., Disp: 90 tablet, Rfl: 1 .  metFORMIN (GLUCOPHAGE-XR) 500 MG 24 hr tablet, Take 1 tablet (500 mg total) by mouth 2 (two) times daily. (Patient taking differently: Take 500 mg by mouth daily. ), Disp: 180 tablet, Rfl: 0 .  Multiple Vitamin (MULTIVITAMIN) capsule, Take 1 capsule by mouth daily., Disp: , Rfl:  .  omega-3 acid ethyl esters (LOVAZA) 1 g capsule, TAKE 2 CAPSULES BY MOUTH  TWO TIMES DAILY, Disp: 360 capsule, Rfl: 1 .  potassium chloride SA (K-DUR,KLOR-CON) 20 MEQ tablet, Take 1 tablet (20 mEq total) by mouth daily., Disp: 90 tablet, Rfl: 1 .  Semaglutide (OZEMPIC) 1 MG/DOSE SOPN, Inject 1 mg into the skin once a week., Disp: 9 mL, Rfl: 1 .  venlafaxine XR (EFFEXOR-XR) 150 MG 24 hr capsule, Take 2 capsules (300 mg total) by mouth daily with breakfast. One daily with breakfast., Disp: 180 capsule, Rfl: 1 .  Vitamin D, Ergocalciferol, (DRISDOL) 50000 units CAPS capsule, TAKE 1 CAPSULE BY MOUTH  EVERY 7 DAYS, Disp: 12 capsule, Rfl: 1 .  ACCU-CHEK AVIVA PLUS test strip, Test once daily, Disp: 100 each, Rfl:  11 .  aspirin EC 81 MG tablet, Take 1 tablet (81 mg total) by mouth daily. (Patient not taking: Reported on 04/27/2017), Disp: 30 tablet, Rfl: 0  Allergies  Allergen Reactions  . Abilify  [Aripiprazole]     Other reaction(s): Difficulty breathing  . Invokana [Canagliflozin] Itching    Yeast infections    ROS  Constitutional: Negative for fever, Positive 4 pound weight loss- pt on ozempic.  Respiratory: Positive for cough and shortness of breath- with coughing fits .   Cardiovascular:  Negative for chest pain or palpitations.  Gastrointestinal: Negative for abdominal pain, no bowel changes.  Musculoskeletal: Negative for gait problem or joint swelling.  Skin: Negative for rash.  Neurological: Negative for dizziness (hx of vertigo) Postive headache.  No other specific complaints in a complete review of systems (except as listed in HPI above).  Objective  Vitals:   04/27/17 1522  BP: 122/74  Pulse: 73  Resp: 16  Temp: 98.7 F (37.1 C)  TempSrc: Oral  SpO2: 95%  Weight: 284 lb (128.8 kg)     Body mass index is 43.18 kg/m.  Nursing Note and Vital Signs reviewed.  Physical Exam  Constitutional: Patient appears well-developed and well-nourished. Obese No distress.  HEENT: head atraumatic, normocephalic, pupils equal and reactive to light, EOM's intact, TM's without erythema or bulging, small amounts of ear wax noted in right canal, mild bilateral maxillary sinus tenderness; no frontal sinus tenderness , neck supple without lymphadenopathy, oropharynx pale and dry without exudate Cardiovascular: Normal rate, regular rhythm, S1/S2 present.  No murmur or rub heard.  Pulmonary/Chest: Effort normal and breath sounds clear. No respiratory distress or retractions. Abdominal: Soft and non-tender, bowel sounds present x4 quadrants.   Psychiatric: Patient has a normal mood and affect. behavior is normal. Judgment and thought content normal.   Assessment & Plan   1. Bronchitis Discussed importance of hydration, rest, hand hygiene. Take OTC medications for symptom management as well as the following rx. - benzonatate (TESSALON) 100 MG capsule; Take 1 capsule (100 mg total) by mouth 2 (two) times daily as needed for cough.  Dispense: 20 capsule; Refill: 0 - promethazine-dextromethorphan (PROMETHAZINE-DM) 6.25-15 MG/5ML syrup; Take 2.5 mLs by mouth at bedtime as needed for cough.  Dispense: 118 mL; Refill: 0 - albuterol (PROVENTIL HFA;VENTOLIN HFA) 108 (90 Base) MCG/ACT  inhaler; Inhale 2 puffs into the lungs every 6 (six) hours as needed for wheezing or shortness of breath.  Dispense: 1 Inhaler; Refill: 0  2. Cough - benzonatate (TESSALON) 100 MG capsule; Take 1 capsule (100 mg total) by mouth 2 (two) times daily as needed for cough.  Dispense: 20 capsule; Refill: 0 - promethazine-dextromethorphan (PROMETHAZINE-DM) 6.25-15 MG/5ML syrup; Take 2.5 mLs by mouth at bedtime as needed for cough.  Dispense: 118 mL; Refill: 0 - albuterol (PROVENTIL HFA;VENTOLIN HFA) 108 (90 Base) MCG/ACT inhaler; Inhale 2 puffs into the lungs every 6 (six) hours as needed for wheezing or shortness of breath.  Dispense: 1 Inhaler; Refill: 0   Follow up as needed or new or worsening symptoms.   -Red flags and when to present for emergency care or RTC including fever >101.35F, chest pain, shortness of breath, new/worsening/un-resolving symptoms, reviewed with patient at time of visit. Follow up and care instructions discussed and provided in AVS.   I have reviewed this encounter including the documentation in this note and/or discussed this patient with the provider, Suezanne Cheshire, NP  I am certifying that I agree with the content of this note  as supervising physician.  Steele Sizer, MD [facility] 04/27/2017, 5:12 PM

## 2017-04-27 NOTE — Patient Instructions (Addendum)
You likely have a viral upper respiratory infection (URI). Antibiotics will not reduce the number of days you are ill or prevent you from getting bacterial rhinosinusitis. A URI can take up to 14 days to resolve, but typically last between 7-11 days. Your body is so smart and strong that it will be fighting this illness off for you but it is important that you drink plenty of fluids, rest. Cover your nose/mouth when you cough or sneeze and wash your hands well and often. Here are some helpful things you can use or pick up over the counter from the pharmacy to help with your symptoms:   For Fever/Pain: Acetaminophen every 6 hours as needed (maximum of 3000mg a day). If you are still uncomfortable you can add ibuprofen OR naproxen  For coughing: try dextromethorphan for a cough suppressant, and/or a cool mist humidifier, lozenges  For sore throat: saline gargles, honey herbal tea, lozenges, throat spray  To dry out your nose: try an antihistamine like loratadine (non-sedating) or diphenhydramine (sedating) or others To relieve a stuffy nose:  neti pot To make blowing your nose easier: guaifenesin    

## 2017-04-28 ENCOUNTER — Other Ambulatory Visit: Payer: Self-pay | Admitting: Nurse Practitioner

## 2017-05-01 ENCOUNTER — Telehealth: Payer: Self-pay | Admitting: Family Medicine

## 2017-05-01 NOTE — Telephone Encounter (Signed)
Copied from [place] [id number]. Topic: Quick Communication - Rx Refill/Question >> May 01, 2017  4:47 PM Neva Seat wrote: Pt was seen on 04-27-17 and was prescribed medications.  The Rx's are not helping. Pt is requesting that an antibiotic be called in to help her get rid of the cold/flu.    [facility] [place] [place] - [address] [place] [address] Phone: [phone] Fax: [phone]

## 2017-05-04 ENCOUNTER — Other Ambulatory Visit: Payer: Self-pay | Admitting: Nurse Practitioner

## 2017-05-04 DIAGNOSIS — R0602 Shortness of breath: Secondary | ICD-10-CM

## 2017-05-04 DIAGNOSIS — R059 Cough, unspecified: Secondary | ICD-10-CM

## 2017-05-04 DIAGNOSIS — R05 Cough: Secondary | ICD-10-CM

## 2017-05-04 DIAGNOSIS — J218 Acute bronchiolitis due to other specified organisms: Secondary | ICD-10-CM

## 2017-05-04 MED ORDER — AZITHROMYCIN 250 MG PO TABS: ORAL_TABLET | ORAL | 0 refills | Status: DC

## 2017-05-04 NOTE — Telephone Encounter (Signed)
Please call patient back: _______________ Tina Short-   I am sorry you are not feeling better.I will send you an antibiotic since you have been sick for over 2 weeks without improvement. If you are having shortness of breath, chest tightness, wheezing or worsening symptoms I suggest you come back in to be re-evaluated. I have placed an order for a chest xray that you can complete at the medical mall- at [facility]. I hope you feel better soon.

## 2017-05-04 NOTE — Telephone Encounter (Signed)
Left detailed voicemail

## 2017-05-19 ENCOUNTER — Encounter: Payer: Self-pay | Admitting: Family Medicine

## 2017-05-19 ENCOUNTER — Ambulatory Visit: Payer: Managed Care, Other (non HMO) | Admitting: Family Medicine

## 2017-05-19 VITALS — BP 120/76 | HR 87 | Resp 14 | Ht 68.0 in | Wt 280.3 lb

## 2017-05-19 DIAGNOSIS — E1129 Type 2 diabetes mellitus with other diabetic kidney complication: Secondary | ICD-10-CM | POA: Diagnosis not present

## 2017-05-19 DIAGNOSIS — G4733 Obstructive sleep apnea (adult) (pediatric): Secondary | ICD-10-CM

## 2017-05-19 DIAGNOSIS — E559 Vitamin D deficiency, unspecified: Secondary | ICD-10-CM | POA: Diagnosis not present

## 2017-05-19 DIAGNOSIS — J40 Bronchitis, not specified as acute or chronic: Secondary | ICD-10-CM

## 2017-05-19 DIAGNOSIS — I10 Essential (primary) hypertension: Secondary | ICD-10-CM

## 2017-05-19 DIAGNOSIS — E1140 Type 2 diabetes mellitus with diabetic neuropathy, unspecified: Secondary | ICD-10-CM | POA: Diagnosis not present

## 2017-05-19 DIAGNOSIS — E1169 Type 2 diabetes mellitus with other specified complication: Secondary | ICD-10-CM | POA: Diagnosis not present

## 2017-05-19 DIAGNOSIS — E782 Mixed hyperlipidemia: Secondary | ICD-10-CM

## 2017-05-19 DIAGNOSIS — E785 Hyperlipidemia, unspecified: Secondary | ICD-10-CM | POA: Diagnosis not present

## 2017-05-19 DIAGNOSIS — IMO0002 Reserved for concepts with insufficient information to code with codable children: Secondary | ICD-10-CM

## 2017-05-19 DIAGNOSIS — M109 Gout, unspecified: Secondary | ICD-10-CM | POA: Diagnosis not present

## 2017-05-19 DIAGNOSIS — F331 Major depressive disorder, recurrent, moderate: Secondary | ICD-10-CM

## 2017-05-19 DIAGNOSIS — R809 Proteinuria, unspecified: Secondary | ICD-10-CM

## 2017-05-19 DIAGNOSIS — E1165 Type 2 diabetes mellitus with hyperglycemia: Secondary | ICD-10-CM

## 2017-05-19 LAB — POCT GLYCOSYLATED HEMOGLOBIN (HGB A1C): HEMOGLOBIN A1C: 7.1

## 2017-05-19 MED ORDER — VITAMIN D (ERGOCALCIFEROL) 1.25 MG (50000 UNIT) PO CAPS: ORAL_CAPSULE | ORAL | 1 refills | Status: DC

## 2017-05-19 MED ORDER — SEMAGLUTIDE (1 MG/DOSE) 2 MG/1.5ML ~~LOC~~ SOPN: 1.0000 mg | PEN_INJECTOR | SUBCUTANEOUS | 1 refills | Status: DC

## 2017-05-19 MED ORDER — OMEGA-3-ACID ETHYL ESTERS 1 G PO CAPS: ORAL_CAPSULE | ORAL | 1 refills | Status: DC
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
## 2017-05-24 ENCOUNTER — Other Ambulatory Visit: Payer: Self-pay | Admitting: Family Medicine

## 2017-05-24 DIAGNOSIS — IMO0002 Reserved for concepts with insufficient information to code with codable children: Secondary | ICD-10-CM

## 2017-05-24 DIAGNOSIS — E1129 Type 2 diabetes mellitus with other diabetic kidney complication: Secondary | ICD-10-CM

## 2017-05-24 DIAGNOSIS — E1140 Type 2 diabetes mellitus with diabetic neuropathy, unspecified: Secondary | ICD-10-CM

## 2017-05-24 DIAGNOSIS — E1165 Type 2 diabetes mellitus with hyperglycemia: Principal | ICD-10-CM

## 2017-05-24 DIAGNOSIS — R809 Proteinuria, unspecified: Secondary | ICD-10-CM

## 2017-05-25 NOTE — Telephone Encounter (Signed)
Request for diabetes medication. Metformin to [facility].  Last office visit pertaining to diabetes: 05/19/2017   Lab Results  Component Value Date   HGBA1C 7.1 05/19/2017     Follow up on 08/24/2017

## 2017-05-26 NOTE — Telephone Encounter (Signed)
Last Cr reviewed Please contact patient and find out how exactly she is taking the metformin; last reported was 04/27/17 and she was taking one a day

## 2017-05-26 NOTE — Telephone Encounter (Signed)
Called patient. She reports that she no longer uses [facility] , so refuse that prescription. She is taking her Metformin twice daily and she has enough medication.

## 2017-05-28 ENCOUNTER — Telehealth: Payer: Self-pay | Admitting: Family Medicine

## 2017-05-28 NOTE — Telephone Encounter (Signed)
The form from [place] dated 01/09/17 has been printed and faxed along with the original sleep study ([place]) to [phone].

## 2017-05-28 NOTE — Telephone Encounter (Signed)
Copied from [place] [phone]. Topic: Quick Communication - See Telephone Encounter >> May 28, 2017 11:14 AM Arletha Grippe wrote: CRM for notification. See Telephone encounter for: 05/28/17. Cindy with [facility] called - she got a script for cpap supplies and head gear.  They have never serviced before.  If she needs a machine, she needs a different script with pressure levels.  She tried calling the pt, but couldn't get through.  Cb is [phone] ext [phone] leave message if no answer

## 2017-05-31 ENCOUNTER — Other Ambulatory Visit: Payer: Self-pay | Admitting: Family Medicine

## 2017-05-31 DIAGNOSIS — I10 Essential (primary) hypertension: Secondary | ICD-10-CM

## 2017-05-31 DIAGNOSIS — E782 Mixed hyperlipidemia: Secondary | ICD-10-CM

## 2017-07-31 ENCOUNTER — Other Ambulatory Visit: Payer: Self-pay | Admitting: Family Medicine

## 2017-07-31 DIAGNOSIS — E785 Hyperlipidemia, unspecified: Secondary | ICD-10-CM

## 2017-07-31 DIAGNOSIS — E1169 Type 2 diabetes mellitus with other specified complication: Secondary | ICD-10-CM

## 2017-07-31 DIAGNOSIS — E782 Mixed hyperlipidemia: Secondary | ICD-10-CM

## 2017-08-24 ENCOUNTER — Ambulatory Visit: Payer: Managed Care, Other (non HMO) | Admitting: Family Medicine

## 2017-08-24 ENCOUNTER — Encounter: Payer: Self-pay | Admitting: Family Medicine

## 2017-08-24 VITALS — BP 116/72 | HR 102 | Temp 98.5°F | Resp 16 | Ht 68.0 in | Wt 282.8 lb

## 2017-08-24 DIAGNOSIS — R809 Proteinuria, unspecified: Secondary | ICD-10-CM

## 2017-08-24 DIAGNOSIS — G4733 Obstructive sleep apnea (adult) (pediatric): Secondary | ICD-10-CM | POA: Diagnosis not present

## 2017-08-24 DIAGNOSIS — E1169 Type 2 diabetes mellitus with other specified complication: Secondary | ICD-10-CM | POA: Diagnosis not present

## 2017-08-24 DIAGNOSIS — E782 Mixed hyperlipidemia: Secondary | ICD-10-CM | POA: Diagnosis not present

## 2017-08-24 DIAGNOSIS — R058 Other specified cough: Secondary | ICD-10-CM

## 2017-08-24 DIAGNOSIS — IMO0002 Reserved for concepts with insufficient information to code with codable children: Secondary | ICD-10-CM

## 2017-08-24 DIAGNOSIS — I10 Essential (primary) hypertension: Secondary | ICD-10-CM

## 2017-08-24 DIAGNOSIS — R05 Cough: Secondary | ICD-10-CM

## 2017-08-24 DIAGNOSIS — E1129 Type 2 diabetes mellitus with other diabetic kidney complication: Secondary | ICD-10-CM | POA: Diagnosis not present

## 2017-08-24 DIAGNOSIS — E538 Deficiency of other specified B group vitamins: Secondary | ICD-10-CM

## 2017-08-24 DIAGNOSIS — E559 Vitamin D deficiency, unspecified: Secondary | ICD-10-CM | POA: Diagnosis not present

## 2017-08-24 DIAGNOSIS — F339 Major depressive disorder, recurrent, unspecified: Secondary | ICD-10-CM | POA: Diagnosis not present

## 2017-08-24 DIAGNOSIS — M109 Gout, unspecified: Secondary | ICD-10-CM

## 2017-08-24 DIAGNOSIS — E1140 Type 2 diabetes mellitus with diabetic neuropathy, unspecified: Secondary | ICD-10-CM

## 2017-08-24 DIAGNOSIS — E1165 Type 2 diabetes mellitus with hyperglycemia: Secondary | ICD-10-CM

## 2017-08-24 DIAGNOSIS — R600 Localized edema: Secondary | ICD-10-CM

## 2017-08-24 DIAGNOSIS — E785 Hyperlipidemia, unspecified: Secondary | ICD-10-CM

## 2017-08-24 LAB — POCT GLYCOSYLATED HEMOGLOBIN (HGB A1C): HEMOGLOBIN A1C: 7.7 % — AB (ref 4.0–5.6)

## 2017-08-24 MED ORDER — VENLAFAXINE HCL ER 150 MG PO CP24: 300.0000 mg | ORAL_CAPSULE | Freq: Every day | ORAL | 1 refills | Status: DC

## 2017-08-24 MED ORDER — MODAFINIL 100 MG PO TABS: 100.0000 mg | ORAL_TABLET | Freq: Every day | ORAL | 2 refills | Status: DC

## 2017-08-24 MED ORDER — METFORMIN HCL ER 500 MG PO TB24: 1000.0000 mg | ORAL_TABLET | Freq: Every day | ORAL | 1 refills | Status: DC

## 2017-08-24 MED ORDER — ATORVASTATIN CALCIUM 40 MG PO TABS: 40.0000 mg | ORAL_TABLET | Freq: Every day | ORAL | 1 refills | Status: DC

## 2017-08-24 MED ORDER — LOSARTAN POTASSIUM 100 MG PO TABS: 100.0000 mg | ORAL_TABLET | Freq: Every day | ORAL | 1 refills | Status: DC

## 2017-08-24 MED ORDER — FUROSEMIDE 40 MG PO TABS: ORAL_TABLET | ORAL | 1 refills | Status: DC

## 2017-08-24 MED ORDER — ALLOPURINOL 100 MG PO TABS: 100.0000 mg | ORAL_TABLET | Freq: Two times a day (BID) | ORAL | 1 refills | Status: DC

## 2017-08-24 MED ORDER — LORATADINE 10 MG PO TABS: 10.0000 mg | ORAL_TABLET | Freq: Every day | ORAL | 1 refills | Status: DC
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
## 2017-09-23 ENCOUNTER — Other Ambulatory Visit: Payer: Self-pay | Admitting: Family Medicine

## 2017-09-23 DIAGNOSIS — R600 Localized edema: Secondary | ICD-10-CM

## 2017-09-28 ENCOUNTER — Other Ambulatory Visit: Payer: Self-pay

## 2017-09-28 DIAGNOSIS — E1165 Type 2 diabetes mellitus with hyperglycemia: Principal | ICD-10-CM

## 2017-09-28 DIAGNOSIS — R809 Proteinuria, unspecified: Secondary | ICD-10-CM

## 2017-09-28 DIAGNOSIS — E1140 Type 2 diabetes mellitus with diabetic neuropathy, unspecified: Secondary | ICD-10-CM

## 2017-09-28 DIAGNOSIS — E1129 Type 2 diabetes mellitus with other diabetic kidney complication: Secondary | ICD-10-CM

## 2017-09-28 DIAGNOSIS — IMO0002 Reserved for concepts with insufficient information to code with codable children: Secondary | ICD-10-CM

## 2017-09-28 MED ORDER — SEMAGLUTIDE (1 MG/DOSE) 2 MG/1.5ML ~~LOC~~ SOPN: 1.0000 mg | PEN_INJECTOR | SUBCUTANEOUS | 1 refills | Status: DC

## 2017-09-28 NOTE — Telephone Encounter (Signed)
Insurance required a PA for 27 mL of Ozempic. Preferred writing this dosage is 9 mL per 84 days of the 1 mg/dose.

## 2017-09-30 ENCOUNTER — Other Ambulatory Visit: Payer: Self-pay | Admitting: Family Medicine

## 2017-09-30 DIAGNOSIS — IMO0002 Reserved for concepts with insufficient information to code with codable children: Secondary | ICD-10-CM

## 2017-09-30 DIAGNOSIS — R809 Proteinuria, unspecified: Secondary | ICD-10-CM

## 2017-09-30 DIAGNOSIS — E1129 Type 2 diabetes mellitus with other diabetic kidney complication: Secondary | ICD-10-CM

## 2017-09-30 DIAGNOSIS — E1165 Type 2 diabetes mellitus with hyperglycemia: Principal | ICD-10-CM

## 2017-09-30 DIAGNOSIS — E1140 Type 2 diabetes mellitus with diabetic neuropathy, unspecified: Secondary | ICD-10-CM

## 2017-09-30 MED ORDER — SEMAGLUTIDE (1 MG/DOSE) 2 MG/1.5ML ~~LOC~~ SOPN: 1.0000 mg | PEN_INJECTOR | SUBCUTANEOUS | 0 refills | Status: DC

## 2017-09-30 NOTE — Telephone Encounter (Signed)
Copied from [place] [id number]. Topic: Quick Communication - Rx Refill/Question >> Sep 30, 2017  2:52 PM Burchel, Abbi R wrote: Medication: Semaglutide (OZEMPIC) 1 MG/DOSE [facility]  Preferred Pharmacy: [facility], [place] [facility] [place] [address] [place] [address] Phone: [phone] Fax: [phone]    Pt is 90 day supply.

## 2017-09-30 NOTE — Telephone Encounter (Signed)
Refill request was sent to Dr. Krichna Sowles for approval and submission.  

## 2017-09-30 NOTE — Telephone Encounter (Signed)
Rx refill request

## 2017-10-08 LAB — HM DIABETES EYE EXAM

## 2017-11-24 ENCOUNTER — Ambulatory Visit: Payer: Managed Care, Other (non HMO) | Admitting: Family Medicine

## 2017-11-24 ENCOUNTER — Encounter: Payer: Self-pay | Admitting: Family Medicine

## 2017-11-24 DIAGNOSIS — R062 Wheezing: Secondary | ICD-10-CM | POA: Diagnosis not present

## 2017-11-24 DIAGNOSIS — J069 Acute upper respiratory infection, unspecified: Secondary | ICD-10-CM | POA: Diagnosis not present

## 2017-11-24 MED ORDER — UMECLIDINIUM-VILANTEROL 62.5-25 MCG/INH IN AEPB: 1.0000 | INHALATION_SPRAY | Freq: Every day | RESPIRATORY_TRACT | 0 refills | Status: DC

## 2017-11-24 MED ORDER — MUCINEX DM 30-600 MG PO TB12: 1.0000 | ORAL_TABLET | Freq: Two times a day (BID) | ORAL | 0 refills | Status: DC
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
## 2017-11-26 ENCOUNTER — Telehealth: Payer: Self-pay

## 2017-11-26 NOTE — Telephone Encounter (Signed)
I tried to contact this patient to see if we could give her some clarity but there was no answer. A message was left for her stating that Dr. Ancil Boozer said it was "losing weight, on medication management.

## 2017-11-26 NOTE — Telephone Encounter (Signed)
Copied from [place]. Topic: General - Other >> Nov 25, 2017  4:21 PM Marin Olp L wrote: Reason for CRM: Patient would like a mychart response to explain what was written on the health screening appeal form regarding BMI explained to her. She says she can't understand what written on the form.  I do not see any form that was scanned in so I am unsure what was written. Do you remember what you wrote?

## 2017-11-26 NOTE — Telephone Encounter (Signed)
Losing weight, on medication management

## 2017-12-02 ENCOUNTER — Telehealth: Payer: Self-pay

## 2017-12-02 NOTE — Telephone Encounter (Signed)
Can you please check on her, ask symptoms so I can document? Any fever, chills since last week? Is the cough more productive? Did the inhaler help?

## 2017-12-02 NOTE — Telephone Encounter (Signed)
Copied from [place] [id number]. Topic: General - Inquiry >> Dec 02, 2017  9:55 AM Judyann Munson wrote: Reason for CRM: Pt is calling to request a medication be sent in the pharmacy for her chest congestive and cough she came in on last week to see dr.sowles.   Preferred pharmacy:[id number] [address], [place] [phone] (Phone) [phone] (Fax)

## 2017-12-04 ENCOUNTER — Other Ambulatory Visit: Payer: Self-pay | Admitting: Family Medicine

## 2017-12-04 DIAGNOSIS — R058 Other specified cough: Secondary | ICD-10-CM

## 2017-12-04 DIAGNOSIS — R05 Cough: Secondary | ICD-10-CM

## 2017-12-04 MED ORDER — AZITHROMYCIN 250 MG PO TABS: ORAL_TABLET | ORAL | 0 refills | Status: DC

## 2017-12-04 MED ORDER — BENZONATATE 100 MG PO CAPS: 100.0000 mg | ORAL_CAPSULE | Freq: Two times a day (BID) | ORAL | 0 refills | Status: DC | PRN

## 2017-12-17 ENCOUNTER — Other Ambulatory Visit: Payer: Self-pay | Admitting: Family Medicine

## 2017-12-17 DIAGNOSIS — E559 Vitamin D deficiency, unspecified: Secondary | ICD-10-CM

## 2017-12-18 NOTE — Telephone Encounter (Signed)
Refill request for general medication. Vitamin D to [facility].   Last office visit: 11/24/2017   No follow-ups on file.

## 2018-01-01 ENCOUNTER — Telehealth: Payer: Self-pay | Admitting: Family Medicine

## 2018-01-01 DIAGNOSIS — R600 Localized edema: Secondary | ICD-10-CM

## 2018-01-04 NOTE — Telephone Encounter (Signed)
She needs follow up, last visit was an acute visit, I will send 30 days once scheduled.

## 2018-01-05 ENCOUNTER — Other Ambulatory Visit: Payer: Self-pay

## 2018-01-05 DIAGNOSIS — R600 Localized edema: Secondary | ICD-10-CM

## 2018-01-05 MED ORDER — POTASSIUM CHLORIDE CRYS ER 20 MEQ PO TBCR: EXTENDED_RELEASE_TABLET | ORAL | 0 refills | Status: DC

## 2018-01-05 NOTE — Telephone Encounter (Signed)
LVM for pt to call the office to schedule an appt. °

## 2018-01-05 NOTE — Telephone Encounter (Signed)
Pt scheduled an appt for a medication f/u 01/29/18.  Pt would like a 90 day supply sent to the [facility] pharmacy on file.

## 2018-01-06 NOTE — Addendum Note (Signed)
Addended by: Steele Sizer F on: 01/06/2018 01:04 PM   Modules accepted: Orders

## 2018-01-26 ENCOUNTER — Other Ambulatory Visit: Payer: Self-pay | Admitting: Family Medicine

## 2018-01-27 LAB — CBC WITH DIFFERENTIAL/PLATELET
BASOS: 1 %
Basophils Absolute: 0 10*3/uL (ref 0.0–0.2)
EOS (ABSOLUTE): 0.1 10*3/uL (ref 0.0–0.4)
EOS: 2 %
HEMATOCRIT: 40.4 % (ref 34.0–46.6)
HEMOGLOBIN: 14.2 g/dL (ref 11.1–15.9)
Immature Grans (Abs): 0 10*3/uL (ref 0.0–0.1)
Immature Granulocytes: 0 %
LYMPHS ABS: 2.5 10*3/uL (ref 0.7–3.1)
Lymphs: 43 %
MCH: 31.3 pg (ref 26.6–33.0)
MCHC: 35.1 g/dL (ref 31.5–35.7)
MCV: 89 fL (ref 79–97)
MONOS ABS: 0.5 10*3/uL (ref 0.1–0.9)
Monocytes: 9 %
NEUTROS ABS: 2.6 10*3/uL (ref 1.4–7.0)
Neutrophils: 45 %
Platelets: 183 10*3/uL (ref 150–450)
RBC: 4.53 x10E6/uL (ref 3.77–5.28)
RDW: 12.7 % (ref 12.3–15.4)
WBC: 5.7 10*3/uL (ref 3.4–10.8)

## 2018-01-27 LAB — COMPREHENSIVE METABOLIC PANEL
A/G RATIO: 1.5 (ref 1.2–2.2)
ALBUMIN: 4.1 g/dL (ref 3.6–4.8)
ALK PHOS: 72 IU/L (ref 39–117)
ALT: 25 IU/L (ref 0–32)
AST: 25 IU/L (ref 0–40)
BUN / CREAT RATIO: 16 (ref 12–28)
BUN: 9 mg/dL (ref 8–27)
Bilirubin Total: 0.4 mg/dL (ref 0.0–1.2)
CO2: 23 mmol/L (ref 20–29)
CREATININE: 0.56 mg/dL — AB (ref 0.57–1.00)
Calcium: 9.2 mg/dL (ref 8.7–10.3)
Chloride: 103 mmol/L (ref 96–106)
GFR calc Af Amer: 115 mL/min/{1.73_m2} (ref >59)
GFR calc non Af Amer: 100 mL/min/{1.73_m2} (ref >59)
GLOBULIN, TOTAL: 2.7 g/dL (ref 1.5–4.5)
Glucose: 179 mg/dL — ABNORMAL HIGH (ref 65–99)
POTASSIUM: 3.9 mmol/L (ref 3.5–5.2)
SODIUM: 143 mmol/L (ref 134–144)
Total Protein: 6.8 g/dL (ref 6.0–8.5)

## 2018-01-27 LAB — LIPID PANEL
CHOL/HDL RATIO: 3.4 ratio (ref 0.0–4.4)
Cholesterol, Total: 111 mg/dL (ref 100–199)
HDL: 33 mg/dL — ABNORMAL LOW (ref >39)
LDL CALC: 40 mg/dL (ref 0–99)
Triglycerides: 189 mg/dL — ABNORMAL HIGH (ref 0–149)
VLDL Cholesterol Cal: 38 mg/dL (ref 5–40)

## 2018-01-27 LAB — VITAMIN B12: Vitamin B-12: 352 pg/mL (ref 232–1245)

## 2018-01-27 LAB — MICROALBUMIN / CREATININE URINE RATIO
Creatinine, Urine: 224.7 mg/dL
MICROALB/CREAT RATIO: 12.4 mg/g{creat} (ref 0.0–30.0)
MICROALBUM., U, RANDOM: 27.9 ug/mL

## 2018-01-27 LAB — VITAMIN D 25 HYDROXY (VIT D DEFICIENCY, FRACTURES): VIT D 25 HYDROXY: 26.2 ng/mL — AB (ref 30.0–100.0)

## 2018-01-29 ENCOUNTER — Telehealth: Payer: Self-pay | Admitting: Family Medicine

## 2018-01-29 ENCOUNTER — Ambulatory Visit
Admission: RE | Admit: 2018-01-29 | Discharge: 2018-01-29 | Disposition: A | Discharge status: Home / Self Care | Payer: Managed Care, Other (non HMO) | Attending: Family Medicine | Admitting: Family Medicine

## 2018-01-29 ENCOUNTER — Ambulatory Visit (INDEPENDENT_AMBULATORY_CARE_PROVIDER_SITE_OTHER): Payer: Managed Care, Other (non HMO) | Admitting: Family Medicine

## 2018-01-29 ENCOUNTER — Encounter: Payer: Self-pay | Admitting: Family Medicine

## 2018-01-29 ENCOUNTER — Ambulatory Visit
Admission: RE | Admit: 2018-01-29 | Discharge: 2018-01-29 | Disposition: A | Discharge status: Home / Self Care | Payer: Managed Care, Other (non HMO) | Source: Ambulatory Visit | Attending: Family Medicine | Admitting: Family Medicine

## 2018-01-29 VITALS — BP 124/70 | HR 95 | Temp 98.3°F | Resp 16 | Ht 68.0 in | Wt 279.9 lb

## 2018-01-29 DIAGNOSIS — G4733 Obstructive sleep apnea (adult) (pediatric): Secondary | ICD-10-CM

## 2018-01-29 DIAGNOSIS — R809 Proteinuria, unspecified: Secondary | ICD-10-CM

## 2018-01-29 DIAGNOSIS — I10 Essential (primary) hypertension: Secondary | ICD-10-CM

## 2018-01-29 DIAGNOSIS — R0989 Other specified symptoms and signs involving the circulatory and respiratory systems: Secondary | ICD-10-CM

## 2018-01-29 DIAGNOSIS — R600 Localized edema: Secondary | ICD-10-CM

## 2018-01-29 DIAGNOSIS — Z23 Encounter for immunization: Secondary | ICD-10-CM

## 2018-01-29 DIAGNOSIS — R053 Chronic cough: Secondary | ICD-10-CM

## 2018-01-29 DIAGNOSIS — M109 Gout, unspecified: Secondary | ICD-10-CM

## 2018-01-29 DIAGNOSIS — E1129 Type 2 diabetes mellitus with other diabetic kidney complication: Secondary | ICD-10-CM

## 2018-01-29 DIAGNOSIS — R1310 Dysphagia, unspecified: Secondary | ICD-10-CM

## 2018-01-29 DIAGNOSIS — R1319 Other dysphagia: Secondary | ICD-10-CM

## 2018-01-29 DIAGNOSIS — R05 Cough: Secondary | ICD-10-CM | POA: Diagnosis present

## 2018-01-29 DIAGNOSIS — M25512 Pain in left shoulder: Secondary | ICD-10-CM

## 2018-01-29 DIAGNOSIS — R058 Other specified cough: Secondary | ICD-10-CM

## 2018-01-29 DIAGNOSIS — M25561 Pain in right knee: Secondary | ICD-10-CM | POA: Diagnosis not present

## 2018-01-29 DIAGNOSIS — E559 Vitamin D deficiency, unspecified: Secondary | ICD-10-CM

## 2018-01-29 DIAGNOSIS — E782 Mixed hyperlipidemia: Secondary | ICD-10-CM

## 2018-01-29 DIAGNOSIS — F339 Major depressive disorder, recurrent, unspecified: Secondary | ICD-10-CM

## 2018-01-29 LAB — POCT GLYCOSYLATED HEMOGLOBIN (HGB A1C)
HEMOGLOBIN A1C: 6.7 % (ref 4.0–5.6)
HbA1c, POC (controlled diabetic range): 6.7 % (ref 0.0–7.0)
HbA1c, POC (prediabetic range): 6.7 % — AB (ref 5.7–6.4)
Hemoglobin A1C: 6.7 % — AB (ref 4.0–5.6)

## 2018-01-29 MED ORDER — VITAMIN D (ERGOCALCIFEROL) 1.25 MG (50000 UNIT) PO CAPS: ORAL_CAPSULE | ORAL | 1 refills | Status: DC

## 2018-01-29 MED ORDER — MODAFINIL 100 MG PO TABS: 100.0000 mg | ORAL_TABLET | Freq: Every day | ORAL | 2 refills | Status: DC

## 2018-01-29 MED ORDER — POTASSIUM CHLORIDE CRYS ER 20 MEQ PO TBCR: EXTENDED_RELEASE_TABLET | ORAL | 0 refills | Status: DC

## 2018-01-29 MED ORDER — FUROSEMIDE 40 MG PO TABS: ORAL_TABLET | ORAL | 1 refills | Status: DC

## 2018-01-29 MED ORDER — VITAMIN D (ERGOCALCIFEROL) 1.25 MG (50000 UNIT) PO CAPS: 50000.0000 [IU] | ORAL_CAPSULE | ORAL | 1 refills | Status: DC

## 2018-01-29 MED ORDER — LOSARTAN POTASSIUM 100 MG PO TABS: 100.0000 mg | ORAL_TABLET | Freq: Every day | ORAL | 1 refills | Status: DC

## 2018-01-29 MED ORDER — ATORVASTATIN CALCIUM 40 MG PO TABS: 40.0000 mg | ORAL_TABLET | Freq: Every day | ORAL | 1 refills | Status: DC

## 2018-01-29 MED ORDER — ALLOPURINOL 100 MG PO TABS: 100.0000 mg | ORAL_TABLET | Freq: Two times a day (BID) | ORAL | 1 refills | Status: DC

## 2018-01-29 MED ORDER — VENLAFAXINE HCL ER 150 MG PO CP24: 300.0000 mg | ORAL_CAPSULE | Freq: Every day | ORAL | 1 refills | Status: DC

## 2018-01-29 MED ORDER — SEMAGLUTIDE (1 MG/DOSE) 2 MG/1.5ML ~~LOC~~ SOPN: 1.0000 mg | PEN_INJECTOR | SUBCUTANEOUS | 0 refills | Status: DC

## 2018-01-29 NOTE — Telephone Encounter (Signed)
Please call pharmacy and clarify - Venlafaxine 2 capsules PO once daily with breakfast (300mg )  Furosemide - 40mg  PO once daily.  Thank you!

## 2018-01-29 NOTE — Telephone Encounter (Signed)
Sent vitamin D rx again to pharmacy with one cap every 7 days, not sure what they need about effexor? She has been taking 2 daily
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
## 2018-01-29 NOTE — Telephone Encounter (Signed)
Was sent earlier and Dr Ancil Boozer has left already Thought it might need to be looked at before the weekend. It was marked [place]

## 2018-01-29 NOTE — Telephone Encounter (Signed)
Pharmacist called for clarification on 2 medications  1. Venlafaxine: instructions are take 2 capsules (300 mg) total bu mouth daily with breakfast. Then it says One tablet daily with breakfast. 2. Furosemide: Instructions just read as "daily".

## 2018-01-29 NOTE — Telephone Encounter (Signed)
Copied from [place] [id number]. Topic: Quick Communication - See Telephone Encounter >> Jan 29, 2018 12:20 PM Blase Mess A wrote: CRM for notification. See Telephone encounter for: 01/29/18.  Jamal From [place] is calling regarding instructions regarding Vitamin D, Ergocalciferol, (DRISDOL) 1.25 MG (50000 UT) CAPS capsule [[id number]] and venlafaxine XR (EFFEXOR-XR) 150 MG 24 hr capsule [[id number]] Please advise [phone]

## 2018-01-29 NOTE — Telephone Encounter (Signed)
Left detailed VM with pharmacy

## 2018-02-01 NOTE — Telephone Encounter (Signed)
Called pharmacy, issues have been resolved.

## 2018-02-08 ENCOUNTER — Encounter: Payer: Self-pay | Admitting: Nurse Practitioner

## 2018-02-08 ENCOUNTER — Ambulatory Visit (INDEPENDENT_AMBULATORY_CARE_PROVIDER_SITE_OTHER): Payer: Managed Care, Other (non HMO) | Admitting: Nurse Practitioner

## 2018-02-08 VITALS — BP 142/78 | HR 98 | Temp 97.8°F | Resp 16 | Ht 68.0 in | Wt 273.0 lb

## 2018-02-08 DIAGNOSIS — B379 Candidiasis, unspecified: Secondary | ICD-10-CM | POA: Diagnosis not present

## 2018-02-08 DIAGNOSIS — N3090 Cystitis, unspecified without hematuria: Secondary | ICD-10-CM | POA: Diagnosis not present

## 2018-02-08 DIAGNOSIS — R399 Unspecified symptoms and signs involving the genitourinary system: Secondary | ICD-10-CM

## 2018-02-08 DIAGNOSIS — T3695XA Adverse effect of unspecified systemic antibiotic, initial encounter: Secondary | ICD-10-CM | POA: Diagnosis not present

## 2018-02-08 LAB — POCT URINALYSIS DIPSTICK
Bilirubin, UA: NEGATIVE
Glucose, UA: NEGATIVE
Ketones, UA: NEGATIVE
NITRITE UA: POSITIVE
Protein, UA: POSITIVE — AB
Spec Grav, UA: >=1.03 — AB (ref 1.010–1.025)
Urobilinogen, UA: 0.2 E.U./dL
pH, UA: 6 (ref 5.0–8.0)

## 2018-02-08 MED ORDER — SULFAMETHOXAZOLE-TRIMETHOPRIM 800-160 MG PO TABS: 1.0000 | ORAL_TABLET | Freq: Two times a day (BID) | ORAL | 0 refills | Status: DC

## 2018-02-08 MED ORDER — FLUCONAZOLE 150 MG PO TABS: 150.0000 mg | ORAL_TABLET | Freq: Once | ORAL | 0 refills | Status: AC

## 2018-02-08 NOTE — Patient Instructions (Signed)
Your symptoms should begin to improve within a day of starting antibiotics. But you should finish all the antibiotic pills you get to ensure the bacteria is killed and prevent antibiotic resistance.  If you are having pain when you pee, you can also take a medicine to numb your bladder. Look for Phenazopyridine (Pyridium or AZO) over the counter at your pharmacy. This medicine eases the pain caused by urinary tract infections. It also reduces the need to urinate.  If you frequently get UTI's here are some prevention tips:  - Avoiding spermicides  - Drinking more fluid - This can help prevent bladder infections. ?Urinating right after sex - Some doctors think this helps, because it helps flush out germs that might get into the bladder during sex. There is no proof it works, but it also cannot hurt. ?Vaginal estrogen - If you are a woman who has already been through menopause, your doctor might suggest this. Vaginal estrogen comes in a cream or a flexible ring that you put into your vagina. It can help prevent bladder infections. ?Antibiotics - If you get a lot of bladder infections, and the above methods have not helped, your doctor might give you antibiotics to help prevent infection. But taking antibiotics has downsides, so doctors usually suggest trying other things first   The studies suggesting that cranberry products prevent bladder infections are not very good. Other studies suggest that cranberry products do not prevent bladder infections. But if you want to try cranberry products for this purpose, there is probably not much harm in doing so.   

## 2018-02-08 NOTE — Progress Notes (Signed)
Name: Tina Short   MRN: [id number]    DOB: 06/19/1955   Date:02/08/2018       Progress Note  Subjective  Chief Complaint  Chief Complaint  Patient presents with  . Urinary Tract Infection    started on Wednesday with frequency and burning upon urination. lower abdominal pressure. had the stomach bug on Friday.    HPI  Patient endorses dysuria, urinary frequency and suprapubic pressure ongoing since Wednesday- states small amounts too. Took AZO with some relief but then got a stomach bug- nausea, vomiting, that self resolved within 24 hours. Still notes some suprapubic pressure and dysuria. Denies fever, chills, lower back pain   Patient Active Problem List   Diagnosis Date Noted  . Sepsis ([place]) 08/10/2016  . Encounter for screening colonoscopy 04/30/2016  . Major depression, recurrent, chronic ([place]) 10/30/2015  . Dyslipidemia associated with type 2 diabetes mellitus ([place]) 10/30/2015  . Vitamin D deficiency 07/27/2015  . Vitamin B12 deficiency 07/27/2015  . History of anemia 09/12/2014  . Carpal tunnel syndrome 09/12/2014  . Obstructive sleep apnea 09/12/2014  . Edema of extremities 09/12/2014  . Gout 09/12/2014  . Morbid obesity, unspecified obesity type ([place]) 11/15/2009  . Benign essential HTN 10/07/2006  . Type 2 diabetes mellitus, uncontrolled, with neuropathy ([place]) 10/07/2006  . Hyperlipidemia 10/07/2006    Past Medical History:  Diagnosis Date  . Depression   . Diabetes ([place])   . Hyperlipidemia   . Hypertension   . Neuropathy due to type 2 diabetes mellitus ([place])   . Obesity, morbid ([place]) 11/15/2009  . Sleep apnea    cpap  . Swelling     Past Surgical History:  Procedure Laterality Date  . ABDOMINAL HYSTERECTOMY    . COLONOSCOPY    . COLONOSCOPY WITH PROPOFOL N/A 05/14/2016   Procedure: COLONOSCOPY WITH PROPOFOL;  Surgeon: [name], MD;  Location: [facility] ENDOSCOPY;  Service: Endoscopy;  Laterality: N/A;  . FOOT SURGERY Right    X2  . SHOULDER  SURGERY Right   . WRIST SURGERY Bilateral     Social History   Tobacco Use  . Smoking status: Never Smoker  . Smokeless tobacco: Never Used  Substance Use Topics  . Alcohol use: No    Alcohol/week: 0.0 standard drinks     Current Outpatient Medications:  .  allopurinol (ZYLOPRIM) 100 MG tablet, Take 1 tablet (100 mg total) by mouth 2 (two) times daily., Disp: 180 tablet, Rfl: 1 .  aspirin EC 81 MG tablet, Take 1 tablet (81 mg total) by mouth daily., Disp: 30 tablet, Rfl: 0 .  atorvastatin (LIPITOR) 40 MG tablet, Take 1 tablet (40 mg total) by mouth at bedtime., Disp: 90 tablet, Rfl: 1 .  furosemide (LASIX) 40 MG tablet, Daily, Disp: 90 tablet, Rfl: 1 .  loratadine (CLARITIN) 10 MG tablet, Take 1 tablet (10 mg total) by mouth daily., Disp: 90 tablet, Rfl: 1 .  losartan (COZAAR) 100 MG tablet, Take 1 tablet (100 mg total) by mouth daily., Disp: 90 tablet, Rfl: 1 .  metFORMIN (GLUCOPHAGE-XR) 500 MG 24 hr tablet, Take 2 tablets (1,000 mg total) by mouth daily with breakfast., Disp: 180 tablet, Rfl: 1 .  modafinil (PROVIGIL) 100 MG tablet, Take 1 tablet (100 mg total) by mouth daily., Disp: 30 tablet, Rfl: 2 .  Multiple Vitamin (MULTIVITAMIN) capsule, Take 1 capsule by mouth daily., Disp: , Rfl:  .  omega-3 acid ethyl esters (LOVAZA) 1 g capsule, TAKE 2 CAPSULES BY MOUTH  TWO TIMES  DAILY, Disp: 360 capsule, Rfl: 1 .  potassium chloride SA (K-DUR,KLOR-CON) 20 MEQ tablet, TAKE 1 TABLET(20 MEQ) BY MOUTH DAILY, Disp: 90 tablet, Rfl: 0 .  Semaglutide, 1 MG/DOSE, (OZEMPIC, 1 MG/DOSE,) 2 MG/1.5ML SOPN, Inject 1 mg into the skin once a week., Disp: 9 mL, Rfl: 0 .  venlafaxine XR (EFFEXOR-XR) 150 MG 24 hr capsule, Take 2 capsules (300 mg total) by mouth daily with breakfast. One daily with breakfast., Disp: 180 capsule, Rfl: 1 .  Vitamin D, Ergocalciferol, (DRISDOL) 1.25 MG (50000 UT) CAPS capsule, Take 1 capsule (50,000 Units total) by mouth every 7 (seven) days. Weekly, Disp: 12 capsule, Rfl:  1  Allergies  Allergen Reactions  . Abilify [Aripiprazole] Shortness Of Breath    Other reaction(s): Difficulty breathing  . Invokana [Canagliflozin] Itching    Yeast infections    ROS   No other specific complaints in a complete review of systems (except as listed in HPI above).  Objective  Vitals:   02/08/18 1454  BP: (!) 142/78  Pulse: 98  Resp: 16  Temp: 97.8 F (36.6 C)  TempSrc: Oral  SpO2: 96%  Weight: 273 lb (123.8 kg)  Height: 5\' 8"  (1.727 m)    Body mass index is 41.51 kg/m.  Nursing Note and Vital Signs reviewed.  Physical Exam HENT:     Head: Normocephalic and atraumatic.     Nose: Nose normal.     Mouth/Throat:     Mouth: Mucous membranes are dry.  Cardiovascular:     Rate and Rhythm: Regular rhythm.  Pulmonary:     Effort: Pulmonary effort is normal.     Breath sounds: Normal breath sounds.  Abdominal:     Tenderness: There is no abdominal tenderness. There is no right CVA tenderness or left CVA tenderness.     Comments: Bladder pressure with suprapubic palpation  Musculoskeletal: Normal range of motion.  Skin:    General: Skin is warm and dry.     Findings: No rash.  Neurological:     General: No focal deficit present.     Mental Status: She is alert and oriented to person, place, and time.  Psychiatric:        Mood and Affect: Mood normal.        Thought Content: Thought content normal.       No results found for this or any previous visit (from the past 48 hour(s)).  Assessment & Plan  1. UTI symptoms - POCT urinalysis dipstick - Urine Culture - sulfamethoxazole-trimethoprim (BACTRIM DS,SEPTRA DS) 800-160 MG tablet; Take 1 tablet by mouth 2 (two) times daily.  Dispense: 10 tablet; Refill: 0  2. Antibiotic-induced yeast infection - fluconazole (DIFLUCAN) 150 MG tablet; Take 1 tablet (150 mg total) by mouth once for 1 dose.  Dispense: 1 tablet; Refill: 0  3. Cystitis History of urosepsis will provide 5 days course of bactrim.  Recheck urine for protein and blood in one month  - Urine Culture - sulfamethoxazole-trimethoprim (BACTRIM DS,SEPTRA DS) 800-160 MG tablet; Take 1 tablet by mouth 2 (two) times daily.  Dispense: 10 tablet; Refill: 0

## 2018-02-10 LAB — URINE CULTURE
MICRO NUMBER:: 91552981
SPECIMEN QUALITY:: ADEQUATE

## 2018-03-04 ENCOUNTER — Encounter: Payer: Self-pay | Admitting: Gastroenterology

## 2018-03-11 ENCOUNTER — Ambulatory Visit: Payer: Managed Care, Other (non HMO)

## 2018-04-06 ENCOUNTER — Encounter: Payer: Self-pay | Admitting: Gastroenterology

## 2018-04-06 ENCOUNTER — Other Ambulatory Visit: Payer: Self-pay

## 2018-04-06 ENCOUNTER — Ambulatory Visit: Payer: Managed Care, Other (non HMO) | Admitting: Gastroenterology

## 2018-04-06 VITALS — BP 122/73 | HR 90 | Resp 17 | Ht 68.0 in | Wt 278.0 lb

## 2018-04-06 DIAGNOSIS — Z1211 Encounter for screening for malignant neoplasm of colon: Secondary | ICD-10-CM

## 2018-04-06 DIAGNOSIS — R1319 Other dysphagia: Secondary | ICD-10-CM

## 2018-04-06 DIAGNOSIS — R1310 Dysphagia, unspecified: Secondary | ICD-10-CM | POA: Diagnosis not present

## 2018-04-06 MED ORDER — OMEPRAZOLE 40 MG PO CPDR: 40.0000 mg | DELAYED_RELEASE_CAPSULE | Freq: Every day | ORAL | 0 refills | Status: DC

## 2018-04-06 NOTE — Progress Notes (Signed)
Tina Darby, MD [address]  [place], [place] [address]  Main: [phone]  Fax: [phone]    Gastroenterology Consultation  Referring Provider:     Steele Sizer, MD Primary Care Physician:  Steele Sizer, MD Primary Gastroenterologist:  Dr. Cephas Short Reason for Consultation:     Dysphagia        HPI:   Tina Short is a 63 y.o. female referred by Dr. Steele Sizer, MD  for consultation & management of chronic dysphagia.  Ongoing for last 1 year, sensation of food stuck in throat particularly meat and now pills.  She had food impaction in the past that needed Heimlich's maneuver after she had chicken.  Since then, she tries to chew well to avoid impaction.  These episodes are becoming more frequent lately.  She denies heartburn, regurgitation.  She has not tried any proton pump inhibitor.  She denies weight loss.  She works for [employer], she denies smoking or drinking alcohol  NSAIDs: None  Antiplts/Anticoagulants/Anti thrombotics: None  GI Procedures:  Colonoscopy by Dr. Bary Castilla in 2018, unable to intubate right colon - Diverticulosis in the sigmoid colon. - One 6 mm polyp in the sigmoid colon. Biopsied. - The distal rectum and anal verge are normal on retroflexion view.  DIAGNOSIS:  A. COLON POLYP, RECTOSIGMOID; COLD BIOPSY:  - HYPERPLASTIC POLYP, 2 FRAGMENTS.  - NEGATIVE FOR DYSPLASIA AND MALIGNANCY.  Past Medical History:  Diagnosis Date  . Depression   . Diabetes ([place])   . Hyperlipidemia   . Hypertension   . Neuropathy due to type 2 diabetes mellitus ([place])   . Obesity, morbid ([place]) 11/15/2009  . Sleep apnea    cpap  . Swelling     Past Surgical History:  Procedure Laterality Date  . ABDOMINAL HYSTERECTOMY    . COLONOSCOPY    . COLONOSCOPY WITH PROPOFOL N/A 05/14/2016   Procedure: COLONOSCOPY WITH PROPOFOL;  Surgeon: [name], MD;  Location: [facility] ENDOSCOPY;  Service: Endoscopy;  Laterality: N/A;  . FOOT  SURGERY Right    X2  . SHOULDER SURGERY Right   . WRIST SURGERY Bilateral     Current Outpatient Medications:  .  allopurinol (ZYLOPRIM) 100 MG tablet, Take 1 tablet (100 mg total) by mouth 2 (two) times daily., Disp: 180 tablet, Rfl: 1 .  atorvastatin (LIPITOR) 40 MG tablet, Take 1 tablet (40 mg total) by mouth at bedtime., Disp: 90 tablet, Rfl: 1 .  furosemide (LASIX) 40 MG tablet, Daily, Disp: 90 tablet, Rfl: 1 .  losartan (COZAAR) 100 MG tablet, Take 1 tablet (100 mg total) by mouth daily., Disp: 90 tablet, Rfl: 1 .  metFORMIN (GLUCOPHAGE-XR) 500 MG 24 hr tablet, Take 2 tablets (1,000 mg total) by mouth daily with breakfast., Disp: 180 tablet, Rfl: 1 .  omega-3 acid ethyl esters (LOVAZA) 1 g capsule, TAKE 2 CAPSULES BY MOUTH  TWO TIMES DAILY, Disp: 360 capsule, Rfl: 1 .  potassium chloride SA (K-DUR,KLOR-CON) 20 MEQ tablet, TAKE 1 TABLET(20 MEQ) BY MOUTH DAILY, Disp: 90 tablet, Rfl: 0 .  Semaglutide, 1 MG/DOSE, (OZEMPIC, 1 MG/DOSE,) 2 MG/1.5ML SOPN, Inject 1 mg into the skin once a week., Disp: 9 mL, Rfl: 0 .  venlafaxine XR (EFFEXOR-XR) 150 MG 24 hr capsule, Take 2 capsules (300 mg total) by mouth daily with breakfast. One daily with breakfast., Disp: 180 capsule, Rfl: 1 .  Vitamin D, Ergocalciferol, (DRISDOL) 1.25 MG (50000 UT) CAPS capsule, Take 1 capsule (50,000 Units total)  by mouth every 7 (seven) days. Weekly, Disp: 12 capsule, Rfl: 1 .  aspirin EC 81 MG tablet, Take 1 tablet (81 mg total) by mouth daily. (Patient not taking: Reported on 04/06/2018), Disp: 30 tablet, Rfl: 0 .  fluconazole (DIFLUCAN) 150 MG tablet, , Disp: , Rfl:  .  loratadine (CLARITIN) 10 MG tablet, Take 1 tablet (10 mg total) by mouth daily. (Patient not taking: Reported on 04/06/2018), Disp: 90 tablet, Rfl: 1 .  modafinil (PROVIGIL) 100 MG tablet, Take 1 tablet (100 mg total) by mouth daily. (Patient not taking: Reported on 04/06/2018), Disp: 30 tablet, Rfl: 2 .  Multiple Vitamin (MULTIVITAMIN) capsule, Take 1  capsule by mouth daily., Disp: , Rfl:  .  omeprazole (PRILOSEC) 40 MG capsule, Take 1 capsule (40 mg total) by mouth daily., Disp: 90 capsule, Rfl: 0 .  sulfamethoxazole-trimethoprim (BACTRIM DS,SEPTRA DS) 800-160 MG tablet, Take 1 tablet by mouth 2 (two) times daily. (Patient not taking: Reported on 04/06/2018), Disp: 10 tablet, Rfl: 0    Family History  Problem Relation Age of Onset  . Diabetes Mother   . Cancer Mother        uterine  . Mental illness Father   . Obesity Daughter   . Diabetes Maternal Grandmother   . Heart disease Brother   . Obesity Daughter      Social History   Tobacco Use  . Smoking status: Never Smoker  . Smokeless tobacco: Never Used  Substance Use Topics  . Alcohol use: No    Alcohol/week: 0.0 standard drinks  . Drug use: No    Allergies as of 04/06/2018 - Review Complete 04/06/2018  Allergen Reaction Noted  . Abilify [aripiprazole] Shortness Of Breath 06/05/2014  . Invokana [canagliflozin] Itching 10/30/2015    Review of Systems:    All systems reviewed and negative except where noted in HPI.   Physical Exam:  BP 122/73 (BP Location: Left Arm, Patient Position: Sitting, Cuff Size: Large)   Pulse 90   Resp 17   Ht 5\' 8"  (1.727 m)   Wt 278 lb (126.1 kg)   BMI 42.27 kg/m  No LMP recorded. Patient has had a hysterectomy.  General:   Alert,  Well-developed, well-nourished, pleasant and cooperative in NAD Head:  Normocephalic and atraumatic. Eyes:  Sclera clear, no icterus.   Conjunctiva pink. Ears:  Normal auditory acuity. Nose:  No deformity, discharge, or lesions. Mouth:  No deformity or lesions,oropharynx pink & moist. Neck:  Supple; no masses or thyromegaly. Lungs:  Respirations even and unlabored.  Clear throughout to auscultation.   No wheezes, crackles, or rhonchi. No acute distress. Heart:  Regular rate and rhythm; no murmurs, clicks, rubs, or gallops. Abdomen:  Normal bowel sounds. Soft, obese, non-tender and non-distended without  masses, limited abdominal exam due to body habitus, no guarding or rebound tenderness.   Rectal: Not performed Msk:  Symmetrical without gross deformities. Good, equal movement & strength bilaterally. Pulses:  Normal pulses noted. Extremities:  No clubbing or edema.  No cyanosis. Neurologic:  Alert and oriented x3;  grossly normal neurologically. Skin:  Intact without significant lesions or rashes. No jaundice. Psych:  Alert and cooperative. Normal mood and affect.  Imaging Studies: Reviewed  Assessment and Plan:   CYBILL URIEGAS is a 63 y.o. female with metabolic syndrome, fatty liver presents with 1 year history of intermittent dysphagia to solids and pills  Dysphagia: Differentials include peptic stricture or Schatzki's ring or reflux esophagitis or malignancy or esophageal web or less  likely dysmotility Start omeprazole 40 mg daily before breakfast EGD with possible dilation  Colon cancer screening Previous colonoscopy was incomplete in 2018 Discussed with patient about repeating colonoscopy and patient is agreeable   Follow up in 2 months   Tina Darby, MD

## 2018-04-14 ENCOUNTER — Telehealth: Payer: Self-pay | Admitting: Gastroenterology

## 2018-04-14 NOTE — Telephone Encounter (Signed)
Spoke with pt and she will come by clinic and pick up Suprep bowel kit today 04/14/2018

## 2018-04-14 NOTE — Telephone Encounter (Signed)
Patient called & needs another prep for colonoscopy called into [facility] in [place] for her colonoscopy scheduled with Dr Marius Ditch tomorrow 04-15-2018.

## 2018-04-15 ENCOUNTER — Encounter
Admission: RE | Disposition: A | Discharge status: Home / Self Care | Payer: Self-pay | Source: Home / Self Care | Attending: Gastroenterology

## 2018-04-15 ENCOUNTER — Other Ambulatory Visit: Payer: Self-pay

## 2018-04-15 ENCOUNTER — Ambulatory Visit
Admission: RE | Admit: 2018-04-15 | Discharge: 2018-04-15 | Disposition: A | Discharge status: Home / Self Care | Payer: Managed Care, Other (non HMO) | Attending: Gastroenterology | Admitting: Gastroenterology

## 2018-04-15 ENCOUNTER — Ambulatory Visit: Payer: Managed Care, Other (non HMO) | Admitting: Anesthesiology

## 2018-04-15 ENCOUNTER — Encounter: Payer: Self-pay | Admitting: *Deleted

## 2018-04-15 DIAGNOSIS — K3189 Other diseases of stomach and duodenum: Secondary | ICD-10-CM | POA: Diagnosis not present

## 2018-04-15 DIAGNOSIS — K621 Rectal polyp: Secondary | ICD-10-CM

## 2018-04-15 DIAGNOSIS — Z7982 Long term (current) use of aspirin: Secondary | ICD-10-CM | POA: Insufficient documentation

## 2018-04-15 DIAGNOSIS — Q438 Other specified congenital malformations of intestine: Secondary | ICD-10-CM | POA: Diagnosis not present

## 2018-04-15 DIAGNOSIS — Z13810 Encounter for screening for upper gastrointestinal disorder: Secondary | ICD-10-CM

## 2018-04-15 DIAGNOSIS — D128 Benign neoplasm of rectum: Secondary | ICD-10-CM | POA: Diagnosis not present

## 2018-04-15 DIAGNOSIS — R1310 Dysphagia, unspecified: Secondary | ICD-10-CM | POA: Insufficient documentation

## 2018-04-15 DIAGNOSIS — Z6841 Body Mass Index (BMI) 40.0 and over, adult: Secondary | ICD-10-CM | POA: Insufficient documentation

## 2018-04-15 DIAGNOSIS — K219 Gastro-esophageal reflux disease without esophagitis: Secondary | ICD-10-CM | POA: Insufficient documentation

## 2018-04-15 DIAGNOSIS — F329 Major depressive disorder, single episode, unspecified: Secondary | ICD-10-CM | POA: Insufficient documentation

## 2018-04-15 DIAGNOSIS — E785 Hyperlipidemia, unspecified: Secondary | ICD-10-CM | POA: Insufficient documentation

## 2018-04-15 DIAGNOSIS — R1319 Other dysphagia: Secondary | ICD-10-CM

## 2018-04-15 DIAGNOSIS — D122 Benign neoplasm of ascending colon: Secondary | ICD-10-CM | POA: Insufficient documentation

## 2018-04-15 DIAGNOSIS — Z1211 Encounter for screening for malignant neoplasm of colon: Secondary | ICD-10-CM | POA: Diagnosis not present

## 2018-04-15 DIAGNOSIS — Z7984 Long term (current) use of oral hypoglycemic drugs: Secondary | ICD-10-CM | POA: Insufficient documentation

## 2018-04-15 DIAGNOSIS — G4730 Sleep apnea, unspecified: Secondary | ICD-10-CM | POA: Insufficient documentation

## 2018-04-15 DIAGNOSIS — I10 Essential (primary) hypertension: Secondary | ICD-10-CM | POA: Insufficient documentation

## 2018-04-15 DIAGNOSIS — K2930 Chronic superficial gastritis without bleeding: Secondary | ICD-10-CM | POA: Diagnosis not present

## 2018-04-15 DIAGNOSIS — E1140 Type 2 diabetes mellitus with diabetic neuropathy, unspecified: Secondary | ICD-10-CM | POA: Insufficient documentation

## 2018-04-15 DIAGNOSIS — Z79899 Other long term (current) drug therapy: Secondary | ICD-10-CM | POA: Diagnosis not present

## 2018-04-15 HISTORY — PX: ESOPHAGOGASTRODUODENOSCOPY (EGD) WITH PROPOFOL: SHX5813

## 2018-04-15 HISTORY — PX: COLONOSCOPY WITH PROPOFOL: SHX5780

## 2018-04-15 LAB — GLUCOSE, CAPILLARY: Glucose-Capillary: 148 mg/dL — ABNORMAL HIGH (ref 70–99)

## 2018-04-15 SURGERY — ESOPHAGOGASTRODUODENOSCOPY (EGD) WITH PROPOFOL
Anesthesia: General

## 2018-04-15 MED ORDER — LIDOCAINE HCL (CARDIAC) PF 100 MG/5ML IV SOSY
PREFILLED_SYRINGE | INTRAVENOUS | Status: DC | PRN
  Administered 2018-04-15: 80 mg via INTRAVENOUS

## 2018-04-15 MED ORDER — SODIUM CHLORIDE 0.9 % IV SOLN
INTRAVENOUS | Status: DC
  Administered 2018-04-15: 10:00:00 via INTRAVENOUS

## 2018-04-15 MED ORDER — PROPOFOL 500 MG/50ML IV EMUL
INTRAVENOUS | Status: DC | PRN
  Administered 2018-04-15: 100 ug/kg/min via INTRAVENOUS

## 2018-04-15 MED ORDER — FENTANYL CITRATE (PF) 100 MCG/2ML IJ SOLN
INTRAMUSCULAR | Status: DC | PRN
  Administered 2018-04-15: 50 ug via INTRAVENOUS

## 2018-04-15 MED ORDER — FENTANYL CITRATE (PF) 100 MCG/2ML IJ SOLN
INTRAMUSCULAR | Status: AC
  Filled 2018-04-15: qty 2

## 2018-04-15 MED ORDER — PROPOFOL 500 MG/50ML IV EMUL
INTRAVENOUS | Status: AC
  Filled 2018-04-15: qty 50

## 2018-04-15 MED ORDER — PROPOFOL 10 MG/ML IV BOLUS
INTRAVENOUS | Status: AC
  Filled 2018-04-15: qty 20

## 2018-04-15 MED ORDER — PROPOFOL 10 MG/ML IV BOLUS
INTRAVENOUS | Status: AC
  Filled 2018-04-15: qty 40

## 2018-04-15 MED ORDER — PROPOFOL 10 MG/ML IV BOLUS
INTRAVENOUS | Status: DC | PRN
  Administered 2018-04-15 (×2): 50 mg via INTRAVENOUS

## 2018-04-15 NOTE — Op Note (Signed)
[facility] Gastroenterology Patient Name: Tina Short Procedure Date: 04/15/2018 9:41 AM MRN: [id number] Account #: [id number] Date of Birth: 1955-03-17 Admit Type: Outpatient Age: 63 Room: [facility] ENDO ROOM 2 Gender: Female Note Status: Finalized Procedure:            Upper GI endoscopy Indications:          Dysphagia, Follow-up of gastro-esophageal reflux                        disease, Screening for Barrett's esophagus in patient                        at risk for this condition Providers:            Lin Landsman MD, MD Referring MD:         Bethena Roys. Sowles, MD (Referring MD) Medicines:            Monitored Anesthesia Care Complications:        No immediate complications. Estimated blood loss: None. Procedure:            Pre-Anesthesia Assessment:                       - Prior to the procedure, a History and Physical was                        performed, and patient medications and allergies were                        reviewed. The patient is competent. The risks and                        benefits of the procedure and the sedation options and                        risks were discussed with the patient. All questions                        were answered and informed consent was obtained.                        Patient identification and proposed procedure were                        verified by the physician, the nurse, the                        anesthesiologist, the anesthetist and the technician in                        the pre-procedure area in the procedure room in the                        endoscopy suite. Mental Status Examination: alert and                        oriented. Airway Examination: normal oropharyngeal                        airway and neck mobility. Respiratory Examination:  clear to auscultation. CV Examination: normal.                        Prophylactic Antibiotics: The patient does not require              prophylactic antibiotics. Prior Anticoagulants: The                        patient has taken no previous anticoagulant or                        antiplatelet agents. ASA Grade Assessment: III - A                        patient with severe systemic disease. After reviewing                        the risks and benefits, the patient was deemed in                        satisfactory condition to undergo the procedure. The                        anesthesia plan was to use monitored anesthesia care                        (MAC). Immediately prior to administration of                        medications, the patient was re-assessed for adequacy                        to receive sedatives. The heart rate, respiratory rate,                        oxygen saturations, blood pressure, adequacy of                        pulmonary ventilation, and response to care were                        monitored throughout the procedure. The physical status                        of the patient was re-assessed after the procedure.                       After obtaining informed consent, the endoscope was                        passed under direct vision. Throughout the procedure,                        the patient's blood pressure, pulse, and oxygen                        saturations were monitored continuously. The Endoscope                        was introduced through the mouth, and advanced to the  second part of duodenum. The upper GI endoscopy was                        accomplished without difficulty. The patient tolerated                        the procedure well. Findings:      The duodenal bulb and second portion of the duodenum were normal.      Diffuse mildly erythematous mucosa without bleeding was found in the       gastric body and in the gastric antrum. Biopsies were taken with a cold       forceps for Helicobacter pylori testing.      The cardia and gastric fundus  were normal on retroflexion.      Esophagogastric landmarks were identified: the gastroesophageal junction       was found at 38 cm from the incisors.      The gastroesophageal junction and examined esophagus were normal, no       structural lesions to explain patient's dysphagia. Biopsies were       obtained from the esophagus with cold forceps for histology of suspected       eosinophilic esophagitis. Impression:           - Normal duodenal bulb and second portion of the                        duodenum.                       - Erythematous mucosa in the gastric body and antrum.                        Biopsied.                       - Esophagogastric landmarks identified.                       - Normal gastroesophageal junction and esophagus.                        Biopsied. Recommendation:       - Await pathology results.                       - Trial of PPI                       - If path unremarkable, will consider esophageal                        manometry Procedure Code(s):    --- Professional ---                       [phone], Esophagogastroduodenoscopy, flexible, transoral;                        with biopsy, single or multiple Diagnosis Code(s):    --- Professional ---                       K31.89, Other diseases of stomach and duodenum  R13.10, Dysphagia, unspecified                       K21.9, Gastro-esophageal reflux disease without                        esophagitis                       Z13.810, Encounter for screening for upper                        gastrointestinal disorder CPT copyright 2018 [employer]. All rights reserved. The codes documented in this report are preliminary and upon coder review may  be revised to meet current compliance requirements. Dr. Ulyess Mort Lin Landsman MD, MD 04/15/2018 10:22:23 AM This report has been signed electronically. Number of Addenda: 0 Note Initiated On: 04/15/2018 9:41 AM       [facility]
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
## 2018-04-15 NOTE — Op Note (Signed)
[facility] Gastroenterology Patient Name: Tina Short Procedure Date: 04/15/2018 9:40 AM MRN: [id number] Account #: [id number] Date of Birth: 03-06-1955 Admit Type: Outpatient Age: 63 Room: [facility] ENDO ROOM 2 Gender: Female Note Status: Finalized Procedure:            Colonoscopy Indications:          Screening for colorectal malignant neoplasm, , Last                        colonoscopy: April 2018, poor prep Providers:            Lin Landsman MD, MD Medicines:            Monitored Anesthesia Care Complications:        No immediate complications. Estimated blood loss: None. Procedure:            Pre-Anesthesia Assessment:                       - Prior to the procedure, a History and Physical was                        performed, and patient medications and allergies were                        reviewed. The patient is competent. The risks and                        benefits of the procedure and the sedation options and                        risks were discussed with the patient. All questions                        were answered and informed consent was obtained.                        Patient identification and proposed procedure were                        verified by the physician, the nurse, the                        anesthesiologist, the anesthetist and the technician in                        the pre-procedure area in the procedure room in the                        endoscopy suite. Mental Status Examination: alert and                        oriented. Airway Examination: normal oropharyngeal                        airway and neck mobility. Respiratory Examination:                        clear to auscultation. CV Examination: normal.  Prophylactic Antibiotics: The patient does not require                        prophylactic antibiotics. Prior Anticoagulants: The                        patient has taken no previous anticoagulant or                         antiplatelet agents. ASA Grade Assessment: III - A                        patient with severe systemic disease. After reviewing                        the risks and benefits, the patient was deemed in                        satisfactory condition to undergo the procedure. The                        anesthesia plan was to use monitored anesthesia care                        (MAC). Immediately prior to administration of                        medications, the patient was re-assessed for adequacy                        to receive sedatives. The heart rate, respiratory rate,                        oxygen saturations, blood pressure, adequacy of                        pulmonary ventilation, and response to care were                        monitored throughout the procedure. The physical status                        of the patient was re-assessed after the procedure.                       After obtaining informed consent, the colonoscope was                        passed under direct vision. Throughout the procedure,                        the patient's blood pressure, pulse, and oxygen                        saturations were monitored continuously. The                        Colonoscope was introduced through the anus and  advanced to the the cecum, identified by appendiceal                        orifice and ileocecal valve. The colonoscopy was                        extremely difficult due to significant looping and the                        patient's body habitus. Successful completion of the                        procedure was aided by increasing the dose of sedation                        medication, changing the patient to a prone position                        and applying abdominal pressure. The patient tolerated                        the procedure well. The quality of the bowel                        preparation was evaluated using the  BBPS [facility] Bowel                        Preparation Scale) with scores of: Right Colon = 3,                        Transverse Colon = 3 and Left Colon = 3 (entire mucosa                        seen well with no residual staining, small fragments of                        stool or opaque liquid). The total BBPS score equals 9. Findings:      The perianal and digital rectal examinations were normal. Pertinent       negatives include normal sphincter tone and no palpable rectal lesions.      Three sessile polyps were found in the rectum and ascending colon. The       polyps were 5 to 6 mm in size. These polyps were removed with a cold       snare. Rectal polyp and only AC polyp were retrieved. To prevent       bleeding after the polypectomy, one hemostatic clip was successfully       placed (MR conditional) at rectal polypectomy site. There was no       bleeding at the end of the procedure.      The retroflexed view of the distal rectum and anal verge was normal and       showed no anal or rectal abnormalities.      The exam was otherwise without abnormality. Impression:           - Three 5 to 6 mm polyps in the rectum and in the  ascending colon, removed with a cold snare. Resected                        and retrieved. Clip (MR conditional) was placed.                       - The distal rectum and anal verge are normal on                        retroflexion view.                       - The examination was otherwise normal. Recommendation:       - Discharge patient to home (with escort).                       - Resume previous diet today.                       - Continue present medications.                       - Await pathology results.                       - Repeat colonoscopy in 5 years for surveillance of                        multiple polyps. Procedure Code(s):    --- Professional ---                       [phone], Colonoscopy, flexible; with removal of  tumor(s),                        polyp(s), or other lesion(s) by snare technique Diagnosis Code(s):    --- Professional ---                       Z12.11, Encounter for screening for malignant neoplasm                        of colon                       K62.1, Rectal polyp                       D12.2, Benign neoplasm of ascending colon CPT copyright 2018 American Medical Association. All rights reserved. The codes documented in this report are preliminary and upon coder review may  be revised to meet current compliance requirements. Dr. Ulyess Mort Lin Landsman MD, MD 04/15/2018 11:11:05 AM This report has been signed electronically. Number of Addenda: 0 Note Initiated On: 04/15/2018 9:40 AM Scope Withdrawal Time: 0 hours 22 minutes 33 seconds  Total Procedure Duration: 0 hours 42 minutes 22 seconds       [facility]

## 2018-04-15 NOTE — Transfer of Care (Signed)
Immediate Anesthesia Transfer of Care Note  Patient: Tina Short  Procedure(s) Performed: ESOPHAGOGASTRODUODENOSCOPY (EGD) WITH PROPOFOL (N/A ) COLONOSCOPY WITH PROPOFOL (N/A )  Patient Location: PACU  Anesthesia Type:General  Level of Consciousness: awake, alert  and oriented  Airway & Oxygen Therapy: Patient Spontanous Breathing  Post-op Assessment: Report given to RN and Post -op Vital signs reviewed and stable  Post vital signs: Reviewed and stable  Last Vitals:  Vitals Value Taken Time  BP    Temp    Pulse 94 04/15/2018 11:12 AM  Resp 27 04/15/2018 11:12 AM  SpO2 97 % 04/15/2018 11:12 AM  Vitals shown include unvalidated device data.  Last Pain: There were no vitals filed for this visit.       Complications: No apparent anesthesia complications

## 2018-04-15 NOTE — Anesthesia Postprocedure Evaluation (Signed)
Anesthesia Post Note  Patient: Marlet Korte Napp  Procedure(s) Performed: ESOPHAGOGASTRODUODENOSCOPY (EGD) WITH PROPOFOL (N/A ) COLONOSCOPY WITH PROPOFOL (N/A )  Patient location during evaluation: Endoscopy Anesthesia Type: General Level of consciousness: awake and alert Pain management: pain level controlled Vital Signs Assessment: post-procedure vital signs reviewed and stable Respiratory status: spontaneous breathing, nonlabored ventilation, respiratory function stable and patient connected to nasal cannula oxygen Cardiovascular status: blood pressure returned to baseline and stable Postop Assessment: no apparent nausea or vomiting Anesthetic complications: no     Last Vitals:  Vitals:   04/15/18 1134 04/15/18 1137  BP: (!) 124/94   Pulse: 92 88  Resp: 18 19  Temp:    SpO2: 98% 98%    Last Pain:  Vitals:   04/15/18 1134  PainSc: 0-No pain                 Martha Clan

## 2018-04-15 NOTE — Anesthesia Post-op Follow-up Note (Signed)
Anesthesia QCDR form completed.        

## 2018-04-15 NOTE — Anesthesia Preprocedure Evaluation (Signed)
Anesthesia Evaluation  Patient identified by MRN, date of birth, ID band Patient awake    Reviewed: Allergy & Precautions, H&P , NPO status , Patient's Chart, lab work & pertinent test results, reviewed documented beta blocker date and time   History of Anesthesia Complications Negative for: history of anesthetic complications  Airway Mallampati: II   Neck ROM: full    Dental  (+) Poor Dentition, Dental Advidsory Given, Caps, Missing, Chipped   Pulmonary neg shortness of breath, sleep apnea and Continuous Positive Airway Pressure Ventilation , neg COPD, neg recent URI,           Cardiovascular Exercise Tolerance: Good hypertension, (-) angina(-) CAD, (-) Past MI, (-) Cardiac Stents and (-) CABG (-) dysrhythmias (-) Valvular Problems/Murmurs     Neuro/Psych neg Seizures PSYCHIATRIC DISORDERS Depression  Neuromuscular disease    GI/Hepatic negative GI ROS, Neg liver ROS,   Endo/Other  diabetesMorbid obesity  Renal/GU negative Renal ROS  negative genitourinary   Musculoskeletal   Abdominal   Peds  Hematology negative hematology ROS (+)   Anesthesia Other Findings Past Medical History: No date: Depression No date: Diabetes ([place]) No date: Hyperlipidemia No date: Hypertension No date: Neuropathy due to type 2 diabetes mellitus ([facility]* 11/15/2009: Obesity, morbid ([facility]) No date: Sleep apnea     Comment: cpap No date: Swelling Past Surgical History: No date: ABDOMINAL HYSTERECTOMY No date: COLONOSCOPY No date: FOOT SURGERY Right     Comment: X2 No date: SHOULDER SURGERY Right No date: WRIST SURGERY Bilateral BMI    Body Mass Index:  44.40 kg/m     Reproductive/Obstetrics negative OB ROS                             Anesthesia Physical  Anesthesia Plan  ASA: III  Anesthesia Plan: General   Post-op Pain Management:    Induction: Intravenous  PONV Risk Score and Plan: 3 and Propofol  infusion and TIVA  Airway Management Planned: Natural Airway and Nasal Cannula  Additional Equipment:   Intra-op Plan:   Post-operative Plan:   Informed Consent: I have reviewed the patients History and Physical, chart, labs and discussed the procedure including the risks, benefits and alternatives for the proposed anesthesia with the patient or authorized representative who has indicated his/her understanding and acceptance.     Dental Advisory Given  Plan Discussed with: CRNA  Anesthesia Plan Comments:         Anesthesia Quick Evaluation

## 2018-04-19 LAB — SURGICAL PATHOLOGY

## 2018-04-20 ENCOUNTER — Encounter: Payer: Self-pay | Admitting: Gastroenterology

## 2018-04-30 ENCOUNTER — Ambulatory Visit: Payer: Managed Care, Other (non HMO) | Admitting: Family Medicine

## 2018-05-31 ENCOUNTER — Ambulatory Visit (INDEPENDENT_AMBULATORY_CARE_PROVIDER_SITE_OTHER): Payer: Managed Care, Other (non HMO) | Admitting: Gastroenterology

## 2018-05-31 DIAGNOSIS — R1310 Dysphagia, unspecified: Secondary | ICD-10-CM

## 2018-05-31 DIAGNOSIS — K219 Gastro-esophageal reflux disease without esophagitis: Secondary | ICD-10-CM | POA: Diagnosis not present

## 2018-05-31 DIAGNOSIS — R1319 Other dysphagia: Secondary | ICD-10-CM

## 2018-05-31 MED ORDER — OMEPRAZOLE 40 MG PO CPDR: 40.0000 mg | DELAYED_RELEASE_CAPSULE | Freq: Two times a day (BID) | ORAL | 2 refills | Status: DC

## 2018-05-31 NOTE — Progress Notes (Signed)
Sherri Sear, MD [address]  [place]  [place],  [address]  Main: [phone]  Fax: [phone]    Gastroenterology follow-up video Visit  Referring Provider:     Steele Sizer, MD Primary Care Physician:  Steele Sizer, MD Primary Gastroenterologist:  Dr. Cephas Darby Reason for Consultation: Coughing spells, dysphagia        HPI:   Tina Short is a 63 y.o. female referred by Dr. Steele Sizer, MD  for consultation & management of coughing spells, dysphagia  Virtual Visit Video Note  I connected with Tina Short on 05/31/18 at  9:30 AM EDT by video and verified that I am speaking with the correct person using two identifiers.   I discussed the limitations, risks, security and privacy concerns of performing an evaluation and management service by video and the availability of in person appointments. I also discussed with the patient that there may be a patient responsible charge related to this service. The patient expressed understanding and agreed to proceed.  Location of the Patient: Home  Location of the provider: Home office   History of Present Illness:   Tina Short had initial visit with me on 04/06/2018 for coughing spells as well as dysphagia.  Subsequently, she underwent EGD which was unremarkable including esophageal and gastric biopsies.  I empirically started her on omeprazole 40 mg daily which has been taking since her last visit, last 2 months.  She reports significant improvement in her symptoms.  Her choking episodes and coughing spells are much less frequent.  She did not have any concerns today.  She also underwent colonoscopy for colon cancer screening along with EGD in 04/2018  NSAIDs: None  Antiplts/Anticoagulants/Anti thrombotics: None  GI Procedures:  EGD and colonoscopy 04/15/2018 DIAGNOSIS:  A. STOMACH; COLD BIOPSY:  - MILD SUPERFICIAL CHRONIC ACTIVE GASTRITIS INVOLVING ANTRAL AND OXYNTIC  MUCOSA.  - NEGATIVE  FOR H. PYLORI BY IMMUNOHISTOCHEMISTRY ([facility]).  - NEGATIVE FOR INTESTINAL METAPLASIA, DYSPLASIA, AND MALIGNANCY.   B. ESOPHAGUS; RANDOM COLON BIOPSY:  - STRATIFIED SQUAMOUS EPITHELIUM WITHOUT EOSINOPHILS, NEUTROPHILS, OR  REACTIVE CHANGES.  - NEGATIVE FOR DYSPLASIA AND MALIGNANCY.   C. COLON POLYPS X 2, ASCENDING; COLD SNARE:  - TUBULAR ADENOMA, NEGATIVE FOR HIGH-GRADE DYSPLASIA AND MALIGNANCY.  - HYPERPLASTIC POLYP, NEGATIVE FOR DYSPLASIA AND MALIGNANCY.   D. RECTUM POLYP; COLD SNARE:  - TUBULAR ADENOMA.  - NEGATIVE FOR HIGH-GRADE DYSPLASIA AND MALIGNANCY.   Colonoscopy by Dr. Bary Castilla in 2018, unable to intubate right colon - Diverticulosis in the sigmoid colon. - One 6 mm polyp in the sigmoid colon. Biopsied. - The distal rectum and anal verge are normal on retroflexion view.  DIAGNOSIS:  A. COLON POLYP, RECTOSIGMOID; COLD BIOPSY:  - HYPERPLASTIC POLYP, 2 FRAGMENTS.  - NEGATIVE FOR DYSPLASIA AND MALIGNANCY.  Past Medical History:  Diagnosis Date  . Depression   . Diabetes ([place])   . Hyperlipidemia   . Hypertension   . Neuropathy due to type 2 diabetes mellitus ([place])   . Obesity, morbid ([place]) 11/15/2009  . Sleep apnea    cpap  . Swelling     Past Surgical History:  Procedure Laterality Date  . ABDOMINAL HYSTERECTOMY    . COLONOSCOPY    . COLONOSCOPY WITH PROPOFOL N/A 05/14/2016   Procedure: COLONOSCOPY WITH PROPOFOL;  Surgeon: [name], MD;  Location: [facility] ENDOSCOPY;  Service: Endoscopy;  Laterality: N/A;  . COLONOSCOPY WITH PROPOFOL N/A 04/15/2018   Procedure: COLONOSCOPY WITH PROPOFOL;  Surgeon: Marius Ditch,  Tally Due, MD;  Location: [facility];  Service: Gastroenterology;  Laterality: N/A;  . ESOPHAGOGASTRODUODENOSCOPY (EGD) WITH PROPOFOL N/A 04/15/2018   Procedure: ESOPHAGOGASTRODUODENOSCOPY (EGD) WITH PROPOFOL;  Surgeon: Lin Landsman, MD;  Location: [facility] ENDOSCOPY;  Service: Gastroenterology;  Laterality: N/A;  . FOOT SURGERY Right    X2  .  SHOULDER SURGERY Right   . WRIST SURGERY Bilateral     Current Outpatient Medications:  .  allopurinol (ZYLOPRIM) 100 MG tablet, Take 1 tablet (100 mg total) by mouth 2 (two) times daily., Disp: 180 tablet, Rfl: 1 .  atorvastatin (LIPITOR) 40 MG tablet, Take 1 tablet (40 mg total) by mouth at bedtime., Disp: 90 tablet, Rfl: 1 .  furosemide (LASIX) 40 MG tablet, Daily, Disp: 90 tablet, Rfl: 1 .  losartan (COZAAR) 100 MG tablet, Take 1 tablet (100 mg total) by mouth daily., Disp: 90 tablet, Rfl: 1 .  metFORMIN (GLUCOPHAGE-XR) 500 MG 24 hr tablet, Take 2 tablets (1,000 mg total) by mouth daily with breakfast., Disp: 180 tablet, Rfl: 1 .  omega-3 acid ethyl esters (LOVAZA) 1 g capsule, TAKE 2 CAPSULES BY MOUTH  TWO TIMES DAILY, Disp: 360 capsule, Rfl: 1 .  omeprazole (PRILOSEC) 40 MG capsule, Take 1 capsule (40 mg total) by mouth 2 (two) times daily before a meal for 30 days., Disp: 60 capsule, Rfl: 2 .  potassium chloride SA (K-DUR,KLOR-CON) 20 MEQ tablet, TAKE 1 TABLET(20 MEQ) BY MOUTH DAILY, Disp: 90 tablet, Rfl: 0 .  Semaglutide, 1 MG/DOSE, (OZEMPIC, 1 MG/DOSE,) 2 MG/1.5ML SOPN, Inject 1 mg into the skin once a week., Disp: 9 mL, Rfl: 0 .  venlafaxine XR (EFFEXOR-XR) 150 MG 24 hr capsule, Take 2 capsules (300 mg total) by mouth daily with breakfast. One daily with breakfast., Disp: 180 capsule, Rfl: 1 .  Vitamin D, Ergocalciferol, (DRISDOL) 1.25 MG (50000 UT) CAPS capsule, Take 1 capsule (50,000 Units total) by mouth every 7 (seven) days. Weekly, Disp: 12 capsule, Rfl: 1   Family History  Problem Relation Age of Onset  . Diabetes Mother   . Cancer Mother        uterine  . Mental illness Father   . Obesity Daughter   . Diabetes Maternal Grandmother   . Heart disease Brother   . Obesity Daughter      Social History   Tobacco Use  . Smoking status: Never Smoker  . Smokeless tobacco: Never Used  Substance Use Topics  . Alcohol use: No    Alcohol/week: 0.0 standard drinks  . Drug  use: No    Allergies as of 05/31/2018 - Review Complete 05/31/2018  Allergen Reaction Noted  . Abilify [aripiprazole] Shortness Of Breath 06/05/2014  . Invokana [canagliflozin] Itching 10/30/2015     Imaging Studies: Fatty liver  Assessment and Plan:   Tina Short is a 63 y.o. female with obesity, metabolic syndrome, fatty liver with chronic dysphagia to solids status post EGD which was unremarkable, symptoms currently responding to proton pump inhibitor  Dysphagia Increase omeprazole to 40 mg twice daily for a month, if she does not notice any further improvement, advised her to decrease to once a day Consider esophageal manometry and pH impedance in future if her symptoms are persistent and not completely responding to PPI  Fatty liver Secondary to metabolic syndrome Tight control of diabetes and hyperlipidemia Encouraged to eat healthy, physical activity Most recent LFTs normal    Follow Up Instructions:   I discussed the assessment and treatment plan with the patient.  The patient was provided an opportunity to ask questions and all were answered. The patient agreed with the plan and demonstrated an understanding of the instructions.   The patient was advised to call back or seek an in-person evaluation if the symptoms worsen or if the condition fails to improve as anticipated.  I provided 10 minutes of face-to-face time during this encounter.   Follow up in 3 months   Cephas Darby, MD

## 2018-06-07 ENCOUNTER — Ambulatory Visit: Payer: Managed Care, Other (non HMO) | Admitting: Gastroenterology

## 2018-07-10 ENCOUNTER — Other Ambulatory Visit: Payer: Self-pay | Admitting: Family Medicine

## 2018-07-10 DIAGNOSIS — E1129 Type 2 diabetes mellitus with other diabetic kidney complication: Secondary | ICD-10-CM

## 2018-08-25 ENCOUNTER — Other Ambulatory Visit: Payer: Self-pay | Admitting: Family Medicine

## 2018-08-25 DIAGNOSIS — E1140 Type 2 diabetes mellitus with diabetic neuropathy, unspecified: Secondary | ICD-10-CM

## 2018-08-25 DIAGNOSIS — IMO0002 Reserved for concepts with insufficient information to code with codable children: Secondary | ICD-10-CM

## 2018-08-25 DIAGNOSIS — E1129 Type 2 diabetes mellitus with other diabetic kidney complication: Secondary | ICD-10-CM

## 2018-08-25 NOTE — Telephone Encounter (Signed)
Pt is coming in on friday

## 2018-08-27 ENCOUNTER — Other Ambulatory Visit: Payer: Self-pay

## 2018-08-27 ENCOUNTER — Encounter: Payer: Self-pay | Admitting: Family Medicine

## 2018-08-27 ENCOUNTER — Ambulatory Visit (INDEPENDENT_AMBULATORY_CARE_PROVIDER_SITE_OTHER): Payer: Managed Care, Other (non HMO) | Admitting: Family Medicine

## 2018-08-27 DIAGNOSIS — E559 Vitamin D deficiency, unspecified: Secondary | ICD-10-CM

## 2018-08-27 DIAGNOSIS — E538 Deficiency of other specified B group vitamins: Secondary | ICD-10-CM

## 2018-08-27 DIAGNOSIS — R809 Proteinuria, unspecified: Secondary | ICD-10-CM

## 2018-08-27 DIAGNOSIS — E1129 Type 2 diabetes mellitus with other diabetic kidney complication: Secondary | ICD-10-CM | POA: Diagnosis not present

## 2018-08-27 DIAGNOSIS — I10 Essential (primary) hypertension: Secondary | ICD-10-CM

## 2018-08-27 DIAGNOSIS — IMO0002 Reserved for concepts with insufficient information to code with codable children: Secondary | ICD-10-CM

## 2018-08-27 DIAGNOSIS — F339 Major depressive disorder, recurrent, unspecified: Secondary | ICD-10-CM

## 2018-08-27 DIAGNOSIS — R600 Localized edema: Secondary | ICD-10-CM

## 2018-08-27 DIAGNOSIS — E1140 Type 2 diabetes mellitus with diabetic neuropathy, unspecified: Secondary | ICD-10-CM

## 2018-08-27 DIAGNOSIS — M170 Bilateral primary osteoarthritis of knee: Secondary | ICD-10-CM

## 2018-08-27 DIAGNOSIS — E785 Hyperlipidemia, unspecified: Secondary | ICD-10-CM

## 2018-08-27 DIAGNOSIS — E1169 Type 2 diabetes mellitus with other specified complication: Secondary | ICD-10-CM

## 2018-08-27 DIAGNOSIS — E782 Mixed hyperlipidemia: Secondary | ICD-10-CM

## 2018-08-27 DIAGNOSIS — E1165 Type 2 diabetes mellitus with hyperglycemia: Secondary | ICD-10-CM

## 2018-08-27 DIAGNOSIS — M109 Gout, unspecified: Secondary | ICD-10-CM

## 2018-08-27 MED ORDER — LOSARTAN POTASSIUM 100 MG PO TABS: 100.0000 mg | ORAL_TABLET | Freq: Every day | ORAL | 1 refills | Status: DC

## 2018-08-27 MED ORDER — ALLOPURINOL 100 MG PO TABS: 100.0000 mg | ORAL_TABLET | Freq: Two times a day (BID) | ORAL | 1 refills | Status: DC

## 2018-08-27 MED ORDER — OMEGA-3-ACID ETHYL ESTERS 1 G PO CAPS: ORAL_CAPSULE | ORAL | 1 refills | Status: DC

## 2018-08-27 MED ORDER — VITAMIN D (ERGOCALCIFEROL) 1.25 MG (50000 UNIT) PO CAPS: 50000.0000 [IU] | ORAL_CAPSULE | ORAL | 1 refills | Status: DC

## 2018-08-27 MED ORDER — OZEMPIC (1 MG/DOSE) 2 MG/1.5ML ~~LOC~~ SOPN: 1.0000 mg | PEN_INJECTOR | SUBCUTANEOUS | 1 refills | Status: DC

## 2018-08-27 MED ORDER — VENLAFAXINE HCL ER 150 MG PO CP24: 300.0000 mg | ORAL_CAPSULE | Freq: Every day | ORAL | 1 refills | Status: DC

## 2018-08-27 MED ORDER — FUROSEMIDE 40 MG PO TABS: ORAL_TABLET | ORAL | 1 refills | Status: DC

## 2018-08-27 MED ORDER — ATORVASTATIN CALCIUM 40 MG PO TABS: 40.0000 mg | ORAL_TABLET | Freq: Every day | ORAL | 1 refills | Status: DC

## 2018-08-27 MED ORDER — POTASSIUM CHLORIDE CRYS ER 20 MEQ PO TBCR: EXTENDED_RELEASE_TABLET | ORAL | 0 refills | Status: DC

## 2018-08-27 MED ORDER — METFORMIN HCL ER 500 MG PO TB24: 1000.0000 mg | ORAL_TABLET | Freq: Every day | ORAL | 1 refills | Status: DC
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
## 2018-08-31 ENCOUNTER — Ambulatory Visit: Payer: Managed Care, Other (non HMO) | Admitting: Gastroenterology

## 2018-10-11 ENCOUNTER — Encounter: Payer: Self-pay | Admitting: Family Medicine

## 2018-10-14 ENCOUNTER — Encounter: Payer: Self-pay | Admitting: Family Medicine

## 2018-10-14 ENCOUNTER — Other Ambulatory Visit: Payer: Self-pay

## 2018-10-14 ENCOUNTER — Ambulatory Visit: Payer: Managed Care, Other (non HMO) | Admitting: Family Medicine

## 2018-10-14 VITALS — BP 118/62 | HR 94 | Temp 97.7°F | Resp 16 | Ht 68.0 in | Wt 296.3 lb

## 2018-10-14 DIAGNOSIS — L0291 Cutaneous abscess, unspecified: Secondary | ICD-10-CM | POA: Diagnosis not present

## 2018-10-14 DIAGNOSIS — R0989 Other specified symptoms and signs involving the circulatory and respiratory systems: Secondary | ICD-10-CM | POA: Diagnosis not present

## 2018-10-14 DIAGNOSIS — B070 Plantar wart: Secondary | ICD-10-CM

## 2018-10-14 MED ORDER — SULFAMETHOXAZOLE-TRIMETHOPRIM 800-160 MG PO TABS: 1.0000 | ORAL_TABLET | Freq: Two times a day (BID) | ORAL | 0 refills | Status: DC
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
## 2018-10-19 LAB — ANAEROBIC AND AEROBIC CULTURE

## 2018-10-26 ENCOUNTER — Ambulatory Visit (INDEPENDENT_AMBULATORY_CARE_PROVIDER_SITE_OTHER): Payer: Managed Care, Other (non HMO) | Admitting: Podiatry

## 2018-10-26 ENCOUNTER — Encounter: Payer: Self-pay | Admitting: Podiatry

## 2018-10-26 ENCOUNTER — Ambulatory Visit (INDEPENDENT_AMBULATORY_CARE_PROVIDER_SITE_OTHER): Payer: Managed Care, Other (non HMO)

## 2018-10-26 ENCOUNTER — Other Ambulatory Visit: Payer: Self-pay

## 2018-10-26 VITALS — Temp 96.9°F

## 2018-10-26 DIAGNOSIS — L97522 Non-pressure chronic ulcer of other part of left foot with fat layer exposed: Secondary | ICD-10-CM | POA: Diagnosis not present

## 2018-10-26 DIAGNOSIS — E0843 Diabetes mellitus due to underlying condition with diabetic autonomic (poly)neuropathy: Secondary | ICD-10-CM

## 2018-10-26 DIAGNOSIS — E11621 Type 2 diabetes mellitus with foot ulcer: Secondary | ICD-10-CM

## 2018-10-26 DIAGNOSIS — L97529 Non-pressure chronic ulcer of other part of left foot with unspecified severity: Secondary | ICD-10-CM

## 2018-10-26 MED ORDER — SULFAMETHOXAZOLE-TRIMETHOPRIM 800-160 MG PO TABS: 1.0000 | ORAL_TABLET | Freq: Two times a day (BID) | ORAL | 0 refills | Status: DC

## 2018-10-27 ENCOUNTER — Other Ambulatory Visit: Payer: Self-pay

## 2018-10-27 ENCOUNTER — Emergency Department: Payer: Managed Care, Other (non HMO)

## 2018-10-27 ENCOUNTER — Encounter: Payer: Self-pay | Admitting: Emergency Medicine

## 2018-10-27 ENCOUNTER — Inpatient Hospital Stay
Admission: EM | Admit: 2018-10-27 | Discharge: 2018-10-29 | DRG: 872 | Disposition: A | Discharge status: Home / Self Care | Payer: Managed Care, Other (non HMO) | Attending: Internal Medicine | Admitting: Internal Medicine

## 2018-10-27 DIAGNOSIS — Z818 Family history of other mental and behavioral disorders: Secondary | ICD-10-CM

## 2018-10-27 DIAGNOSIS — Z833 Family history of diabetes mellitus: Secondary | ICD-10-CM

## 2018-10-27 DIAGNOSIS — G4730 Sleep apnea, unspecified: Secondary | ICD-10-CM | POA: Diagnosis present

## 2018-10-27 DIAGNOSIS — Z79899 Other long term (current) drug therapy: Secondary | ICD-10-CM

## 2018-10-27 DIAGNOSIS — M109 Gout, unspecified: Secondary | ICD-10-CM | POA: Diagnosis present

## 2018-10-27 DIAGNOSIS — Z8249 Family history of ischemic heart disease and other diseases of the circulatory system: Secondary | ICD-10-CM

## 2018-10-27 DIAGNOSIS — Z20828 Contact with and (suspected) exposure to other viral communicable diseases: Secondary | ICD-10-CM | POA: Diagnosis present

## 2018-10-27 DIAGNOSIS — E872 Acidosis: Secondary | ICD-10-CM | POA: Diagnosis present

## 2018-10-27 DIAGNOSIS — L03116 Cellulitis of left lower limb: Secondary | ICD-10-CM | POA: Diagnosis present

## 2018-10-27 DIAGNOSIS — E1142 Type 2 diabetes mellitus with diabetic polyneuropathy: Secondary | ICD-10-CM | POA: Diagnosis present

## 2018-10-27 DIAGNOSIS — A419 Sepsis, unspecified organism: Secondary | ICD-10-CM | POA: Diagnosis not present

## 2018-10-27 DIAGNOSIS — Z23 Encounter for immunization: Secondary | ICD-10-CM | POA: Diagnosis not present

## 2018-10-27 DIAGNOSIS — I10 Essential (primary) hypertension: Secondary | ICD-10-CM | POA: Diagnosis present

## 2018-10-27 DIAGNOSIS — F338 Other recurrent depressive disorders: Secondary | ICD-10-CM | POA: Diagnosis present

## 2018-10-27 DIAGNOSIS — Z90710 Acquired absence of both cervix and uterus: Secondary | ICD-10-CM

## 2018-10-27 DIAGNOSIS — E1165 Type 2 diabetes mellitus with hyperglycemia: Secondary | ICD-10-CM | POA: Diagnosis present

## 2018-10-27 DIAGNOSIS — E785 Hyperlipidemia, unspecified: Secondary | ICD-10-CM | POA: Diagnosis present

## 2018-10-27 DIAGNOSIS — Z6841 Body Mass Index (BMI) 40.0 and over, adult: Secondary | ICD-10-CM

## 2018-10-27 DIAGNOSIS — Z7984 Long term (current) use of oral hypoglycemic drugs: Secondary | ICD-10-CM

## 2018-10-27 DIAGNOSIS — Z888 Allergy status to other drugs, medicaments and biological substances status: Secondary | ICD-10-CM

## 2018-10-27 DIAGNOSIS — L03032 Cellulitis of left toe: Secondary | ICD-10-CM | POA: Diagnosis present

## 2018-10-27 DIAGNOSIS — E7849 Other hyperlipidemia: Secondary | ICD-10-CM | POA: Diagnosis present

## 2018-10-27 LAB — CBC WITH DIFFERENTIAL/PLATELET
Abs Immature Granulocytes: 0.04 10*3/uL (ref 0.00–0.07)
Basophils Absolute: 0 10*3/uL (ref 0.0–0.1)
Basophils Relative: 0 %
Eosinophils Absolute: 0 10*3/uL (ref 0.0–0.5)
Eosinophils Relative: 1 %
HCT: 40.9 % (ref 36.0–46.0)
Hemoglobin: 14.3 g/dL (ref 12.0–15.0)
Immature Granulocytes: 1 %
Lymphocytes Relative: 4 %
Lymphs Abs: 0.3 10*3/uL — ABNORMAL LOW (ref 0.7–4.0)
MCH: 31.9 pg (ref 26.0–34.0)
MCHC: 35 g/dL (ref 30.0–36.0)
MCV: 91.3 fL (ref 80.0–100.0)
Monocytes Absolute: 0.3 10*3/uL (ref 0.1–1.0)
Monocytes Relative: 3 %
Neutro Abs: 7.9 10*3/uL — ABNORMAL HIGH (ref 1.7–7.7)
Neutrophils Relative %: 91 %
Platelets: 179 10*3/uL (ref 150–400)
RBC: 4.48 MIL/uL (ref 3.87–5.11)
RDW: 12.8 % (ref 11.5–15.5)
WBC: 8.6 10*3/uL (ref 4.0–10.5)
nRBC: 0 % (ref 0.0–0.2)

## 2018-10-27 LAB — COMPREHENSIVE METABOLIC PANEL
ALT: 33 U/L (ref 0–44)
AST: 46 U/L — ABNORMAL HIGH (ref 15–41)
Albumin: 3.7 g/dL (ref 3.5–5.0)
Alkaline Phosphatase: 72 U/L (ref 38–126)
Anion gap: 15 (ref 5–15)
BUN: 8 mg/dL (ref 8–23)
CO2: 23 mmol/L (ref 22–32)
Calcium: 9.1 mg/dL (ref 8.9–10.3)
Chloride: 101 mmol/L (ref 98–111)
Creatinine, Ser: 0.7 mg/dL (ref 0.44–1.00)
GFR calc Af Amer: >60 mL/min (ref >60)
GFR calc non Af Amer: >60 mL/min (ref >60)
Glucose, Bld: 330 mg/dL — ABNORMAL HIGH (ref 70–99)
Potassium: 3.7 mmol/L (ref 3.5–5.1)
Sodium: 139 mmol/L (ref 135–145)
Total Bilirubin: 1.2 mg/dL (ref 0.3–1.2)
Total Protein: 7.6 g/dL (ref 6.5–8.1)

## 2018-10-27 LAB — GLUCOSE, CAPILLARY: Glucose-Capillary: 254 mg/dL — ABNORMAL HIGH (ref 70–99)

## 2018-10-27 LAB — APTT: aPTT: 30 seconds (ref 24–36)

## 2018-10-27 LAB — URIC ACID: Uric Acid, Serum: 5.3 mg/dL (ref 2.5–7.1)

## 2018-10-27 LAB — LACTIC ACID, PLASMA
Lactic Acid, Venous: 4.6 mmol/L (ref 0.5–1.9)
Lactic Acid, Venous: 3.9 mmol/L (ref 0.5–1.9)

## 2018-10-27 MED ORDER — INSULIN ASPART 100 UNIT/ML ~~LOC~~ SOLN
0.0000 [IU] | Freq: Every day | SUBCUTANEOUS | Status: DC
  Administered 2018-10-27: 3 [IU] via SUBCUTANEOUS
  Administered 2018-10-28: 23:00:00 2 [IU] via SUBCUTANEOUS
  Filled 2018-10-27 (×2): qty 1

## 2018-10-27 MED ORDER — METRONIDAZOLE IN NACL 5-0.79 MG/ML-% IV SOLN
500.0000 mg | Freq: Once | INTRAVENOUS | Status: AC
  Administered 2018-10-27: 500 mg via INTRAVENOUS
  Filled 2018-10-27: qty 100

## 2018-10-27 MED ORDER — POLYETHYLENE GLYCOL 3350 17 G PO PACK: 17.0000 g | PACK | Freq: Every day | ORAL | Status: DC | PRN

## 2018-10-27 MED ORDER — ONDANSETRON HCL 4 MG/2ML IJ SOLN: 4.0000 mg | Freq: Four times a day (QID) | INTRAMUSCULAR | Status: DC | PRN

## 2018-10-27 MED ORDER — PNEUMOCOCCAL VAC POLYVALENT 25 MCG/0.5ML IJ INJ
0.5000 mL | INJECTION | INTRAMUSCULAR | Status: AC
  Administered 2018-10-28: 09:00:00 0.5 mL via INTRAMUSCULAR
  Filled 2018-10-27: qty 0.5

## 2018-10-27 MED ORDER — ENOXAPARIN SODIUM 40 MG/0.4ML ~~LOC~~ SOLN
40.0000 mg | Freq: Two times a day (BID) | SUBCUTANEOUS | Status: DC
  Administered 2018-10-27 – 2018-10-29 (×4): 40 mg via SUBCUTANEOUS
  Filled 2018-10-27 (×4): qty 0.4

## 2018-10-27 MED ORDER — INSULIN ASPART 100 UNIT/ML ~~LOC~~ SOLN
0.0000 [IU] | Freq: Three times a day (TID) | SUBCUTANEOUS | Status: DC
  Administered 2018-10-28 (×2): 3 [IU] via SUBCUTANEOUS
  Administered 2018-10-29: 2 [IU] via SUBCUTANEOUS
  Filled 2018-10-27 (×3): qty 1

## 2018-10-27 MED ORDER — ACETAMINOPHEN 650 MG RE SUPP: 650.0000 mg | Freq: Four times a day (QID) | RECTAL | Status: DC | PRN

## 2018-10-27 MED ORDER — SODIUM CHLORIDE 0.9% FLUSH: 3.0000 mL | Freq: Once | INTRAVENOUS | Status: DC

## 2018-10-27 MED ORDER — ALLOPURINOL 100 MG PO TABS
100.0000 mg | ORAL_TABLET | Freq: Two times a day (BID) | ORAL | Status: DC
  Administered 2018-10-27 – 2018-10-29 (×4): 100 mg via ORAL
  Filled 2018-10-27 (×6): qty 1

## 2018-10-27 MED ORDER — OXYCODONE HCL 5 MG PO TABS: 5.0000 mg | ORAL_TABLET | ORAL | Status: DC | PRN

## 2018-10-27 MED ORDER — ONDANSETRON HCL 4 MG PO TABS: 4.0000 mg | ORAL_TABLET | Freq: Four times a day (QID) | ORAL | Status: DC | PRN

## 2018-10-27 MED ORDER — SODIUM CHLORIDE 0.9 % IV SOLN
2.0000 g | Freq: Once | INTRAVENOUS | Status: AC
  Administered 2018-10-27: 2 g via INTRAVENOUS
  Filled 2018-10-27: qty 2

## 2018-10-27 MED ORDER — SODIUM CHLORIDE 0.9 % IV SOLN
INTRAVENOUS | Status: DC
  Administered 2018-10-27 – 2018-10-29 (×3): via INTRAVENOUS

## 2018-10-27 MED ORDER — ACETAMINOPHEN 325 MG PO TABS
650.0000 mg | ORAL_TABLET | Freq: Four times a day (QID) | ORAL | Status: DC | PRN
  Administered 2018-10-27 – 2018-10-28 (×2): 650 mg via ORAL
  Filled 2018-10-27 (×2): qty 2

## 2018-10-27 MED ORDER — VANCOMYCIN HCL IN DEXTROSE 1-5 GM/200ML-% IV SOLN: 1000.0000 mg | Freq: Once | INTRAVENOUS | Status: DC

## 2018-10-27 MED ORDER — ATORVASTATIN CALCIUM 20 MG PO TABS
40.0000 mg | ORAL_TABLET | Freq: Every day | ORAL | Status: DC
  Administered 2018-10-27 – 2018-10-28 (×2): 40 mg via ORAL
  Filled 2018-10-27 (×2): qty 2

## 2018-10-27 MED ORDER — VANCOMYCIN HCL IN DEXTROSE 1-5 GM/200ML-% IV SOLN
1000.0000 mg | Freq: Two times a day (BID) | INTRAVENOUS | Status: DC
  Administered 2018-10-28 – 2018-10-29 (×3): 1000 mg via INTRAVENOUS
  Filled 2018-10-27 (×5): qty 200

## 2018-10-27 MED ORDER — VANCOMYCIN HCL 10 G IV SOLR
2500.0000 mg | Freq: Once | INTRAVENOUS | Status: AC
  Administered 2018-10-27: 18:00:00 2500 mg via INTRAVENOUS
  Filled 2018-10-27: qty 2500

## 2018-10-27 MED ORDER — SODIUM CHLORIDE 0.9 % IV BOLUS: 1000.0000 mL | Freq: Once | INTRAVENOUS | Status: DC

## 2018-10-27 MED ORDER — DOCUSATE SODIUM 100 MG PO CAPS
100.0000 mg | ORAL_CAPSULE | Freq: Two times a day (BID) | ORAL | Status: DC
  Administered 2018-10-27 – 2018-10-28 (×3): 100 mg via ORAL
  Filled 2018-10-27 (×4): qty 1

## 2018-10-27 MED ORDER — SODIUM CHLORIDE 0.9 % IV BOLUS
1000.0000 mL | Freq: Once | INTRAVENOUS | Status: AC
  Administered 2018-10-27: 19:00:00 1000 mL via INTRAVENOUS

## 2018-10-27 MED ORDER — VENLAFAXINE HCL ER 150 MG PO CP24
300.0000 mg | ORAL_CAPSULE | Freq: Every day | ORAL | Status: DC
  Administered 2018-10-27 – 2018-10-29 (×2): 300 mg via ORAL
  Filled 2018-10-27 (×2): qty 2

## 2018-10-27 MED ORDER — SODIUM CHLORIDE 0.9 % IV SOLN
2.0000 g | Freq: Three times a day (TID) | INTRAVENOUS | Status: DC
  Administered 2018-10-28 – 2018-10-29 (×3): 2 g via INTRAVENOUS
  Filled 2018-10-27 (×6): qty 2

## 2018-10-27 MED ORDER — SEMAGLUTIDE (1 MG/DOSE) 2 MG/1.5ML ~~LOC~~ SOPN: 1.0000 mg | PEN_INJECTOR | SUBCUTANEOUS | Status: DC

## 2018-10-27 MED ORDER — ACETAMINOPHEN 500 MG PO TABS
1000.0000 mg | ORAL_TABLET | Freq: Once | ORAL | Status: AC
  Administered 2018-10-27: 1000 mg via ORAL
  Filled 2018-10-27: qty 2

## 2018-10-27 NOTE — Consult Note (Signed)
Pharmacy Antibiotic Note  Tina Short is a 63 y.o. female admitted on 10/27/2018 with sepsis.  Pharmacy has been consulted for Cefepime/Vancomycin dosing. Patient was seen by her PCP ~ 2 weeks ago for a boil on the posterior right calf in which the patient was treated Bactrim and boiled was cleared. However, patient noticed some drainage and was seen by the podiatrist in which she was prescribed Bactrim again.   Vancomycin 2500 mg x1 dose and Cefepime were given in the ED.  BMI > 40  Plan: 1. Cefepime 2g Q8H- next dose due 0200  2. Vancomycin 1000 mg Q12H - next dose at 0700.  AUC goal: 400-550 Expected AUC: 493.3 Cssmin 14.6   Height: 5\' 8"  (172.7 cm) Weight: 280 lb (127 kg) IBW/kg (Calculated) : 63.9  Temp (24hrs), Avg:100.5 F (38.1 C), Min:99.1 F (37.3 C), Max:101.8 F (38.8 C)  Recent Labs  Lab 10/26/18 1123 10/27/18 1557 10/27/18 1816  WBC 5.4 8.6  --   CREATININE 0.58 0.70  --   LATICACIDVEN  --  4.6* 3.9*    Estimated Creatinine Clearance: 99.9 mL/min (by C-G formula based on SCr of 0.7 mg/dL).    Allergies  Allergen Reactions  . Abilify [Aripiprazole] Shortness Of Breath    Other reaction(s): Difficulty breathing  . Invokana [Canagliflozin] Itching    Yeast infections    Antimicrobials this admission: 09/16 Cefepime >>  09/16 Vancomycin >> 09/16 Metronidazole >>    Microbiology results: 9/16 BCx: pending  9/16 UCx: pending     Thank you for allowing pharmacy to be a part of this patient's care.  Rowland Lathe 10/27/2018 8:01 PM

## 2018-10-27 NOTE — Consult Note (Signed)
PHARMACY -  BRIEF ANTIBIOTIC NOTE   Pharmacy has received consult(s) for Cefepime/Vancomycin from an ED provider.  The patient's profile has been reviewed for ht/wt/allergies/indication/available labs.    One time order(s) placed for Cefepime 2g x1 dose and Vancomycin 2500 mg x1 dose.   Further antibiotics/pharmacy consults should be ordered by admitting physician if indicated.                       Thank you, Rowland Lathe 10/27/2018  5:19 PM

## 2018-10-27 NOTE — Progress Notes (Addendum)
CODE SEPSIS - PHARMACY COMMUNICATION  **Broad Spectrum Antibiotics should be administered within 1 hour of Sepsis diagnosis**  Time Code Sepsis Called/Page Received: 5:17 pm   Antibiotics Ordered: Cefepime/Vancomycin/Metronidazole  Time of 1st antibiotic administration: @1742   Additional action taken by pharmacy: N/A  If necessary, Name of Provider/Nurse Contacted: N/A    Rowland Lathe ,PharmD Clinical Pharmacist  10/27/2018  5:21 PM

## 2018-10-27 NOTE — ED Provider Notes (Signed)
[facility] Emergency Department Provider Note  ____________________________________________   First MD Initiated Contact with Patient 10/27/18 1633     (approximate)  I have reviewed the triage vital signs and the nursing notes.   HISTORY  Chief Complaint Fever    HPI MARVEEN Short is a 63 y.o. female with depression, diabetes, hypertension, hyperlipidemia who presents with concerns for infection.  Patient had a boil on the back of her right leg.  It was positive for staph.  She took antibiotics for 7 to 10 days.  It resolved completely.  Had follow-up with her PCP and had an area on her left toe that was treated with antibiotics as well.  Yesterday they try to debride the area and she restarted antibiotics.  Patient started having a fever today that is severe, constant, nothing makes it better, nothing makes it worse.  She now endorses increasing redness going up her left leg.  She has a history of sepsis secondary to cellulitis in the past.  No extreme pain in her leg.  No abdominal pain, shortness of breath, chest pain, urinary symptoms.          Past Medical History:  Diagnosis Date  . Depression   . Diabetes ([place])   . Hyperlipidemia   . Hypertension   . Neuropathy due to type 2 diabetes mellitus ([place])   . Obesity, morbid ([place]) 11/15/2009  . Sleep apnea    cpap  . Swelling     Patient Active Problem List   Diagnosis Date Noted  . Esophageal dysphagia   . Sepsis ([place]) 08/10/2016  . Colon cancer screening 04/30/2016  . Major depression, recurrent, chronic ([place]) 10/30/2015  . Dyslipidemia associated with type 2 diabetes mellitus ([place]) 10/30/2015  . Vitamin D deficiency 07/27/2015  . Vitamin B12 deficiency 07/27/2015  . History of anemia 09/12/2014  . Carpal tunnel syndrome 09/12/2014  . Obstructive sleep apnea 09/12/2014  . Edema of extremities 09/12/2014  . Gout 09/12/2014  . Morbid obesity, unspecified obesity type ([place]) 11/15/2009   . Benign essential HTN 10/07/2006  . Type 2 diabetes mellitus, uncontrolled, with neuropathy ([place]) 10/07/2006  . Hyperlipidemia 10/07/2006    Past Surgical History:  Procedure Laterality Date  . ABDOMINAL HYSTERECTOMY    . COLONOSCOPY    . COLONOSCOPY WITH PROPOFOL N/A 05/14/2016   Procedure: COLONOSCOPY WITH PROPOFOL;  Surgeon: [name], MD;  Location: [facility] ENDOSCOPY;  Service: Endoscopy;  Laterality: N/A;  . COLONOSCOPY WITH PROPOFOL N/A 04/15/2018   Procedure: COLONOSCOPY WITH PROPOFOL;  Surgeon: Lin Landsman, MD;  Location: [facility] ENDOSCOPY;  Service: Gastroenterology;  Laterality: N/A;  . ESOPHAGOGASTRODUODENOSCOPY (EGD) WITH PROPOFOL N/A 04/15/2018   Procedure: ESOPHAGOGASTRODUODENOSCOPY (EGD) WITH PROPOFOL;  Surgeon: Lin Landsman, MD;  Location: [facility] ENDOSCOPY;  Service: Gastroenterology;  Laterality: N/A;  . FOOT SURGERY Right    X2  . SHOULDER SURGERY Right   . WRIST SURGERY Bilateral     Prior to Admission medications   Medication Sig Start Date End Date Taking? Authorizing Provider  allopurinol (ZYLOPRIM) 100 MG tablet Take 1 tablet (100 mg total) by mouth 2 (two) times daily. 08/27/18   Steele Sizer, MD  atorvastatin (LIPITOR) 40 MG tablet Take 1 tablet (40 mg total) by mouth at bedtime. 08/27/18   Steele Sizer, MD  furosemide (LASIX) 40 MG tablet Daily Patient taking differently: Take 40 mg by mouth as needed for edema. Daily 08/27/18   Steele Sizer, MD  losartan (COZAAR) 100 MG tablet Take  1 tablet (100 mg total) by mouth daily. 08/27/18   Steele Sizer, MD  metFORMIN (GLUCOPHAGE-XR) 500 MG 24 hr tablet Take 2 tablets (1,000 mg total) by mouth daily with breakfast. 08/27/18   Ancil Boozer, Drue Stager, MD  omega-3 acid ethyl esters (LOVAZA) 1 g capsule TAKE 2 CAPSULES BY MOUTH  TWO TIMES DAILY Patient not taking: Reported on 10/14/2018 08/27/18   Steele Sizer, MD  potassium chloride SA (K-DUR) 20 MEQ tablet TAKE 1 TABLET(20 MEQ) BY MOUTH DAILY Patient taking  differently: Take 20 mEq by mouth as needed. TAKE 1 TABLET(20 MEQ) BY MOUTH DAILY 08/27/18   Steele Sizer, MD  Semaglutide, 1 MG/DOSE, (OZEMPIC, 1 MG/DOSE,) 2 MG/1.5ML SOPN Inject 1 mg into the skin once a week. Patient not taking: Reported on 10/14/2018 08/27/18   Steele Sizer, MD  sulfamethoxazole-trimethoprim (BACTRIM DS) 800-160 MG tablet Take 1 tablet by mouth 2 (two) times daily. 10/26/18   Edrick Kins, DPM  venlafaxine XR (EFFEXOR-XR) 150 MG 24 hr capsule Take 2 capsules (300 mg total) by mouth daily with breakfast. One daily with breakfast. 08/27/18   Steele Sizer, MD  Vitamin D, Ergocalciferol, (DRISDOL) 1.25 MG (50000 UT) CAPS capsule Take 1 capsule (50,000 Units total) by mouth every 7 (seven) days. Weekly 08/27/18   Steele Sizer, MD    Allergies Abilify [aripiprazole] and Invokana [canagliflozin]  Family History  Problem Relation Age of Onset  . Diabetes Mother   . Cancer Mother        uterine  . Mental illness Father   . Obesity Daughter   . Diabetes Maternal Grandmother   . Heart disease Brother   . Obesity Daughter     Social History Social History   Tobacco Use  . Smoking status: Never Smoker  . Smokeless tobacco: Never Used  Substance Use Topics  . Alcohol use: No    Alcohol/week: 0.0 standard drinks  . Drug use: No      Review of Systems Constitutional: Positive fever Eyes: No visual changes. ENT: No sore throat. Cardiovascular: Denies chest pain. Respiratory: Denies shortness of breath. Gastrointestinal: No abdominal pain.  No nausea, no vomiting.  No diarrhea.  No constipation. Genitourinary: Negative for dysuria. Musculoskeletal: Negative for back pain.  Positive wound on her left toe, positive redness on her left leg Skin: Negative for rash. Neurological: Negative for headaches, focal weakness or numbness. All other ROS negative ____________________________________________   PHYSICAL EXAM:  VITAL SIGNS: ED Triage Vitals  Enc Vitals  Group     BP 10/27/18 1556 (!) 144/53     Pulse Rate 10/27/18 1556 (!) 108     Resp 10/27/18 1556 20     Temp 10/27/18 1556 99.1 F (37.3 C)     Temp src --      SpO2 10/27/18 1556 100 %     Weight 10/27/18 1553 280 lb (127 kg)     Height 10/27/18 1553 5\' 8"  (1.727 m)     Head Circumference --      Peak Flow --      Pain Score 10/27/18 1553 4     Pain Loc --      Pain Edu? --      Excl. in [place]? --     Constitutional: Alert and oriented. Well appearing and in no acute distress. Eyes: Conjunctivae are normal. EOMI. Head: Atraumatic. Nose: No congestion/rhinnorhea. Mouth/Throat: Mucous membranes are moist.   Neck: No stridor. Trachea Midline. FROM Cardiovascular: Normal rate, regular rhythm. Grossly normal heart sounds.  Good peripheral circulation. Respiratory: Normal respiratory effort.  No retractions. Lungs CTAB. Gastrointestinal: Soft and nontender. No distention. No abdominal bruits.  Musculoskeletal: Ulcer on the bottom of her left big toe with warmth and erythema on her left leg without evidence of crepitus.  Distal pulse intact.  Right leg well-healing area where there was an old boil Neurologic:  Normal speech and language. No gross focal neurologic deficits are appreciated.  Skin:  Skin is warm, dry and intact. No rash noted. Psychiatric: Mood and affect are normal. Speech and behavior are normal. GU: Deferred   ____________________________________________   LABS (all labs ordered are listed, but only abnormal results are displayed)  Labs Reviewed  LACTIC ACID, PLASMA - Abnormal; Notable for the following components:      Result Value   Lactic Acid, Venous 4.6 (*)    All other components within normal limits  LACTIC ACID, PLASMA - Abnormal; Notable for the following components:   Lactic Acid, Venous 3.9 (*)    All other components within normal limits  COMPREHENSIVE METABOLIC PANEL - Abnormal; Notable for the following components:   Glucose, Bld 330 (*)    AST  46 (*)    All other components within normal limits  CBC WITH DIFFERENTIAL/PLATELET - Abnormal; Notable for the following components:   Neutro Abs 7.9 (*)    Lymphs Abs 0.3 (*)    All other components within normal limits  CULTURE, BLOOD (SINGLE)  CULTURE, BLOOD (SINGLE)  SARS CORONAVIRUS 2 (TAT 6-24 HRS)  APTT  URINALYSIS, COMPLETE (UACMP) WITH MICROSCOPIC   ____________________________________________   ED ECG REPORT I, Vanessa [place], the attending physician, personally viewed and interpreted this ECG.  Sinus tachycardia rate of 106, no ST elevation, no T wave inversion except for aVL, normal intervals otherwise ____________________________________________  RADIOLOGY [name], personally viewed and evaluated these images (plain radiographs) as part of my medical decision making, as well as reviewing the written report by the radiologist.  ED MD interpretation: X-ray no evidence of pneumonia.  No fractures or osteomyelitis on other x-rays.  Official radiology report(s): Dg Chest 2 View  Result Date: 10/27/2018 CLINICAL DATA:  Fever. Recent soft tissue infections. EXAM: CHEST - 2 VIEW COMPARISON:  01/29/2018 FINDINGS: Heart size is within normal limits considering the AP technique. Pulmonary vascularity is normal. Lungs are clear. No effusions. No acute bone abnormality. IMPRESSION: No acute abnormalities. Electronically Signed   By: Lorriane Shire M.D.   On: 10/27/2018 16:31   Dg Tibia/fibula Left  Result Date: 10/27/2018 CLINICAL DATA:  Cellulitis. EXAM: LEFT TIBIA AND FIBULA - 2 VIEW COMPARISON:  None FINDINGS: Moderate to advanced degenerative changes are noted within the left knee. No acute fracture or dislocation identified. Mild diffuse soft tissue swelling. IMPRESSION: 1. No acute bone abnormality. 2. Soft tissue swelling 3. Left knee osteoarthritis. Electronically Signed   By: Kerby Moors M.D.   On: 10/27/2018 18:21   Dg Foot Complete Left  Result Date:  10/27/2018 Please see detailed radiograph report in office note.  Dg Toe Great Left  Result Date: 10/27/2018 CLINICAL DATA:  Cellulitis.  Status post treatment with antibiotics. EXAM: LEFT GREAT TOE COMPARISON:  None. FINDINGS: No acute fracture or dislocation identified no focal bone erosions. Degenerative changes at the first MTP joint. Mild diffuse soft tissue swelling. IMPRESSION: 1. Soft tissue swelling. 2. No acute bone abnormality. Electronically Signed   By: Kerby Moors M.D.   On: 10/27/2018 18:19    ____________________________________________   PROCEDURES  Procedure(s) performed (including Critical Care):  .Critical Care Performed by: Vanessa [place], MD Authorized by: Vanessa [place], MD   Critical care provider statement:    Critical care time (minutes):  45   Critical care was necessary to treat or prevent imminent or life-threatening deterioration of the following conditions:  Sepsis   Critical care was time spent personally by me on the following activities:  Discussions with consultants, evaluation of patient's response to treatment, examination of patient, ordering and performing treatments and interventions, ordering and review of laboratory studies, ordering and review of radiographic studies, pulse oximetry, re-evaluation of patient's condition, obtaining history from patient or surrogate and review of old charts     ____________________________________________   INITIAL IMPRESSION / ASSESSMENT AND PLAN / ED COURSE  Kaylana Schmeiser Tully was evaluated in Emergency Department on 10/27/2018 for the symptoms described in the history of present illness. She was evaluated in the context of the global COVID-19 pandemic, which necessitated consideration that the patient might be at risk for infection with the SARS-CoV-2 virus that causes COVID-19. Institutional protocols and algorithms that pertain to the evaluation of patients at risk for COVID-19 are in a state of rapid change  based on information released by regulatory bodies including the CDC and federal and state organizations. These policies and algorithms were followed during the patient's care in the ED.    Patient presents with sepsis most likely secondary to left leg cellulitis.  No crepitus and no pain on proportion to suggest necrotizing fasciitis.  No swelling of the joints to suggest septic joint.  No abdominal pain to suggest abdominal infection.  Low suspicion for UTI and pneumonia.  Will get blood cultures to evaluate for bacteremia.  Will start fluid resuscitation and start broad-spectrum antibiotics.   White count was normal.  However lactate was elevated.  Lactate is downtrending with some fluid but patient continues to get fluid and antibiotics.  On reassessment her heart rate has come down to the 90s.  Her blood pressures has remained stable.  Will discuss with hospital team for admission for sepsis.      ____________________________________________   FINAL CLINICAL IMPRESSION(S) / ED DIAGNOSES   Final diagnoses:  Sepsis, due to unspecified organism, unspecified whether acute organ dysfunction present ([place])  Cellulitis of left lower extremity      MEDICATIONS GIVEN DURING THIS VISIT:  Medications  vancomycin (VANCOCIN) 2,500 mg in sodium chloride 0.9 % 500 mL IVPB (2,500 mg Intravenous New Bag/Given 10/27/18 1829)  sodium chloride 0.9 % bolus 1,000 mL (has no administration in time range)  acetaminophen (TYLENOL) tablet 1,000 mg (1,000 mg Oral Given 10/27/18 1735)  ceFEPIme (MAXIPIME) 2 g in sodium chloride 0.9 % 100 mL IVPB (0 g Intravenous Stopped 10/27/18 1820)  metroNIDAZOLE (FLAGYL) IVPB 500 mg (500 mg Intravenous New Bag/Given 10/27/18 1746)  sodium chloride 0.9 % bolus 1,000 mL (1,000 mLs Intravenous New Bag/Given 10/27/18 1856)     ED Discharge Orders    None       Note:  This document was prepared using Dragon voice recognition software and may include unintentional  dictation errors.   Vanessa [place], MD 10/27/18 Drema Halon

## 2018-10-27 NOTE — H&P (Signed)
[place] [facility] [place] NAME: Tina Short    MR#:  [id number]  DATE OF BIRTH:  20-May-1955  DATE OF ADMISSION:  10/27/2018  PRIMARY CARE PHYSICIAN: Steele Sizer, MD   REQUESTING/REFERRING PHYSICIAN: Dr. Marjean Donna  CHIEF COMPLAINT:   Chief Complaint  Patient presents with   Fever    HISTORY OF PRESENT ILLNESS:  Tina Short  is a 63 y.o. female with a known history of diabetes mellitus, sleep apnea, peripheral neuropathy, hypertension and depression presents from home secondary to worsening fevers and chills associated with left foot pain and redness. Patient went to see her PCP about 2 weeks ago for a boil on the posterior right calf for which she was treated with Bactrim and resulted in complete clearing of the third boil.  At that time she was also noted to have a left foot first toe plantar surface wound with minimal drainage.  And she was referred to see a podiatrist.  She has seen the podiatrist yesterday and had superficial debridement done in the office and the podiatrist advised her to take Bactrim again for 2 weeks.  Patient was fine all day yesterday.  This morning she woke up, felt tired but went to work.  Had to come from work because she was started having fevers and chills and malaise all of a sudden.  Denies any vomiting, chest pain cough.  She had cough in the past for which she has seen GI and was started on PPI after EGD.  Also has chronic nausea likely secondary to her medication Ozempic which she is not taking at this time. In the ED she was noted to be febrile, tachycardic though her white count is normal.  Elevated lactic acid.  X-ray of the first toe does not show any bony involvement.  She has cellulitis of the foot extending onto the leg at this time.  PAST MEDICAL HISTORY:   Past Medical History:  Diagnosis Date   Depression    Diabetes ([place])    Hyperlipidemia    Hypertension    Neuropathy due to type 2  diabetes mellitus ([place])    Obesity, morbid ([place]) 11/15/2009   Sleep apnea    cpap   Swelling     PAST SURGICAL HISTORY:   Past Surgical History:  Procedure Laterality Date   ABDOMINAL HYSTERECTOMY     COLONOSCOPY     COLONOSCOPY WITH PROPOFOL N/A 05/14/2016   Procedure: COLONOSCOPY WITH PROPOFOL;  Surgeon: [name], MD;  Location: [facility] ENDOSCOPY;  Service: Endoscopy;  Laterality: N/A;   COLONOSCOPY WITH PROPOFOL N/A 04/15/2018   Procedure: COLONOSCOPY WITH PROPOFOL;  Surgeon: Lin Landsman, MD;  Location: [facility] ENDOSCOPY;  Service: Gastroenterology;  Laterality: N/A;   ESOPHAGOGASTRODUODENOSCOPY (EGD) WITH PROPOFOL N/A 04/15/2018   Procedure: ESOPHAGOGASTRODUODENOSCOPY (EGD) WITH PROPOFOL;  Surgeon: Lin Landsman, MD;  Location: [facility] ENDOSCOPY;  Service: Gastroenterology;  Laterality: N/A;   FOOT SURGERY Right    X2   SHOULDER SURGERY Right    WRIST SURGERY Bilateral     SOCIAL HISTORY:   Social History   Tobacco Use   Smoking status: Never Smoker   Smokeless tobacco: Never Used  Substance Use Topics   Alcohol use: No    Alcohol/week: 0.0 standard drinks    FAMILY HISTORY:   Family History  Problem Relation Age of Onset   Diabetes Mother    Cancer Mother        uterine   Mental  illness Father    Obesity Daughter    Diabetes Maternal Grandmother    Heart disease Brother    Obesity Daughter     DRUG ALLERGIES:   Allergies  Allergen Reactions   Abilify [Aripiprazole] Shortness Of Breath    Other reaction(s): Difficulty breathing   Invokana [Canagliflozin] Itching    Yeast infections    REVIEW OF SYSTEMS:   Review of Systems  Constitutional: Positive for chills, fever and malaise/fatigue. Negative for weight loss.  HENT: Negative for ear discharge, ear pain, hearing loss and nosebleeds.   Eyes: Negative for blurred vision, double vision and photophobia.  Respiratory: Negative for cough, hemoptysis, shortness of breath  and wheezing.   Cardiovascular: Negative for chest pain, palpitations, orthopnea and leg swelling.  Gastrointestinal: Positive for nausea. Negative for abdominal pain, constipation, diarrhea, heartburn, melena and vomiting.  Genitourinary: Negative for dysuria, frequency, hematuria and urgency.  Musculoskeletal: Positive for myalgias. Negative for back pain and neck pain.  Skin: Negative for rash.  Neurological: Negative for dizziness, tingling, tremors, sensory change, speech change, focal weakness and headaches.  Endo/Heme/Allergies: Does not bruise/bleed easily.  Psychiatric/Behavioral: Negative for depression.    MEDICATIONS AT HOME:   Prior to Admission medications   Medication Sig Start Date End Date Taking? Authorizing Provider  allopurinol (ZYLOPRIM) 100 MG tablet Take 1 tablet (100 mg total) by mouth 2 (two) times daily. 08/27/18  Yes Sowles, Drue Stager, MD  atorvastatin (LIPITOR) 40 MG tablet Take 1 tablet (40 mg total) by mouth at bedtime. 08/27/18  Yes Sowles, Drue Stager, MD  furosemide (LASIX) 40 MG tablet Daily Patient taking differently: Take 40 mg by mouth as needed for edema. Daily 08/27/18  Yes Sowles, Drue Stager, MD  losartan (COZAAR) 100 MG tablet Take 1 tablet (100 mg total) by mouth daily. 08/27/18  Yes Sowles, Drue Stager, MD  metFORMIN (GLUCOPHAGE-XR) 500 MG 24 hr tablet Take 2 tablets (1,000 mg total) by mouth daily with breakfast. 08/27/18  Yes Sowles, Drue Stager, MD  omega-3 acid ethyl esters (LOVAZA) 1 g capsule TAKE 2 CAPSULES BY MOUTH  TWO TIMES DAILY Patient taking differently: Take 2 g by mouth 2 (two) times daily. TAKE 2 CAPSULES BY MOUTH  TWO TIMES DAILY 08/27/18  Yes Sowles, Drue Stager, MD  potassium chloride SA (K-DUR) 20 MEQ tablet TAKE 1 TABLET(20 MEQ) BY MOUTH DAILY Patient taking differently: Take 20 mEq by mouth as needed. TAKE 1 TABLET(20 MEQ) BY MOUTH DAILY 08/27/18  Yes Sowles, Drue Stager, MD  Semaglutide, 1 MG/DOSE, (OZEMPIC, 1 MG/DOSE,) 2 MG/1.5ML SOPN Inject 1 mg into the  skin once a week. 08/27/18  Yes Sowles, Drue Stager, MD  sulfamethoxazole-trimethoprim (BACTRIM DS) 800-160 MG tablet Take 1 tablet by mouth 2 (two) times daily. 10/26/18  Yes Edrick Kins, DPM  venlafaxine XR (EFFEXOR-XR) 150 MG 24 hr capsule Take 2 capsules (300 mg total) by mouth daily with breakfast. One daily with breakfast. 08/27/18  Yes Sowles, Drue Stager, MD  Vitamin D, Ergocalciferol, (DRISDOL) 1.25 MG (50000 UT) CAPS capsule Take 1 capsule (50,000 Units total) by mouth every 7 (seven) days. Weekly 08/27/18  Yes Sowles, Drue Stager, MD      VITAL SIGNS:  Blood pressure (!) 144/53, pulse (!) 108, temperature (!) 101.8 F (38.8 C), temperature source Oral, resp. rate 20, height 5\' 8"  (1.727 m), weight 127 kg, SpO2 100 %.  PHYSICAL EXAMINATION:  Physical Exam  GENERAL:  63 y.o.-year-old obese patient lying in the bed with no acute distress.  EYES: Pupils equal, round, reactive to light and accommodation. No  scleral icterus. Extraocular muscles intact.  HEENT: Head atraumatic, normocephalic. Oropharynx and nasopharynx clear.  NECK:  Supple, no jugular venous distention. No thyroid enlargement, no tenderness.  LUNGS: Normal breath sounds bilaterally, no wheezing, rales,rhonchi or crepitation. No use of accessory muscles of respiration.  Decreased bibasilar breath sounds. CARDIOVASCULAR: S1, S2 normal. No murmurs, rubs, or gallops.  ABDOMEN: Soft, nontender, nondistended. Bowel sounds present. No organomegaly or mass.  EXTREMITIES: No  cyanosis, or clubbing.  Bilateral 2+ pedal edema.  Right calf wound is healed.  Left foot first toe plantar surface wound with superficial debridement, no discharge noted.  Erythema of the toe extending onto the foot and then proximal leg noted. NEUROLOGIC: Cranial nerves II through XII are intact. Muscle strength 5/5 in all extremities. Sensation intact. Gait not checked.  PSYCHIATRIC: The patient is alert and oriented x 3.  SKIN: No obvious rash, lesion, or ulcer.    LABORATORY PANEL:   CBC Recent Labs  Lab 10/27/18 1557  WBC 8.6  HGB 14.3  HCT 40.9  PLT 179   ------------------------------------------------------------------------------------------------------------------  Chemistries  Recent Labs  Lab 10/27/18 1557  NA 139  K 3.7  CL 101  CO2 23  GLUCOSE 330*  BUN 8  CREATININE 0.70  CALCIUM 9.1  AST 46*  ALT 33  ALKPHOS 72  BILITOT 1.2   ------------------------------------------------------------------------------------------------------------------  Cardiac Enzymes No results for input(s): TROPONINI in the last 168 hours. ------------------------------------------------------------------------------------------------------------------  RADIOLOGY:  Dg Chest 2 View  Result Date: 10/27/2018 CLINICAL DATA:  Fever. Recent soft tissue infections. EXAM: CHEST - 2 VIEW COMPARISON:  01/29/2018 FINDINGS: Heart size is within normal limits considering the AP technique. Pulmonary vascularity is normal. Lungs are clear. No effusions. No acute bone abnormality. IMPRESSION: No acute abnormalities. Electronically Signed   By: Lorriane Shire M.D.   On: 10/27/2018 16:31   Dg Tibia/fibula Left  Result Date: 10/27/2018 CLINICAL DATA:  Cellulitis. EXAM: LEFT TIBIA AND FIBULA - 2 VIEW COMPARISON:  None FINDINGS: Moderate to advanced degenerative changes are noted within the left knee. No acute fracture or dislocation identified. Mild diffuse soft tissue swelling. IMPRESSION: 1. No acute bone abnormality. 2. Soft tissue swelling 3. Left knee osteoarthritis. Electronically Signed   By: Kerby Moors M.D.   On: 10/27/2018 18:21   Dg Foot Complete Left  Result Date: 10/27/2018 Please see detailed radiograph report in office note.  Dg Toe Great Left  Result Date: 10/27/2018 CLINICAL DATA:  Cellulitis.  Status post treatment with antibiotics. EXAM: LEFT GREAT TOE COMPARISON:  None. FINDINGS: No acute fracture or dislocation identified no focal  bone erosions. Degenerative changes at the first MTP joint. Mild diffuse soft tissue swelling. IMPRESSION: 1. Soft tissue swelling. 2. No acute bone abnormality. Electronically Signed   By: Kerby Moors M.D.   On: 10/27/2018 18:19      IMPRESSION AND PLAN:   Tina Short  is a 63 y.o. female with a known history of diabetes mellitus, sleep apnea, peripheral neuropathy, hypertension and depression presents from home secondary to worsening fevers and chills associated with left foot pain and redness  1.  Sepsis-likely secondary to left toe and foot cellulitis.  X-ray negative for osteomyelitis. -Follow blood cultures.  Podiatry consult -Vancomycin and cefepime -IV fluids and monitor.  Follow-up lactic acid in a.m.  2.  Uncontrolled diabetes mellitus-check A1c. -Hold metformin given lactic acidosis at this time.  Sliding scale insulin.  Follow-up A1c.  Restart Ozempic if available as patient was in the process of restarting it.  3.  Depression-on Effexor  4.  Gout-with the left foot first toe erythema and tenderness, check uric acid levels.  Patient on allopurinol.  5.  DVT prophylaxis-Lovenox   All the records are reviewed and case discussed with ED provider. Management plans discussed with the patient, family and they are in agreement.  CODE STATUS: Full Code  TOTAL TIME TAKING CARE OF THIS PATIENT: 52 minutes.    Gladstone Lighter M.D on 10/27/2018 at 7:52 PM  Between 7am to 6pm - Pager - [phone]  After 6pm go to [email or web address] - [employer] [facility]  Office  [phone]  CC: Primary care physician; Steele Sizer, MD   Note: This dictation was prepared with Dragon dictation along with smaller phrase technology. Any transcriptional errors that result from this process are unintentional.

## 2018-10-27 NOTE — ED Notes (Addendum)
ED TO INPATIENT HANDOFF REPORT  ED Nurse Name and Phone #: Karena Addison Sims Name/Age/Gender Tina Short 63 y.o. female Room/Bed: ED06A/ED06A  Code Status   Code Status: Prior  Home/SNF/Other Home Patient oriented to: self, place, time and situation Is this baseline? Yes   Triage Complete: Triage complete  Chief Complaint Fever/toe pain  Triage Note Recent boil on back of right leg, + staph, took antibiotics for 7-10 days. Had follow up with PCP, patient had an area on left great toe, treated again with antibiotics.  Started running a fever today.  Patient states after first coarse of antibiotics, had similar symptoms and was septic.   Allergies Allergies  Allergen Reactions  . Abilify [Aripiprazole] Shortness Of Breath    Other reaction(s): Difficulty breathing  . Invokana [Canagliflozin] Itching    Yeast infections    Level of Care/Admitting Diagnosis ED Disposition    ED Disposition Condition [facility] Area: [place] [100120]  Level of Care: Med-Surg [16]  Covid Evaluation: N/A  Diagnosis: Sepsis [facility]) [id number]  Admitting Physician: Gladstone Lighter [id number]  Attending Physician: Gladstone Lighter [id number]  Estimated length of stay: past midnight tomorrow  Certification:: I certify this patient will need inpatient services for at least 2 midnights  PT Class (Do Not Modify): Inpatient [101]  PT Acc Code (Do Not Modify): Private [1]       B Medical/Surgery History Past Medical History:  Diagnosis Date  . Depression   . Diabetes ([place])   . Hyperlipidemia   . Hypertension   . Neuropathy due to type 2 diabetes mellitus ([place])   . Obesity, morbid ([place]) 11/15/2009  . Sleep apnea    cpap  . Swelling    Past Surgical History:  Procedure Laterality Date  . ABDOMINAL HYSTERECTOMY    . COLONOSCOPY    . COLONOSCOPY WITH PROPOFOL N/A 05/14/2016   Procedure: COLONOSCOPY WITH PROPOFOL;  Surgeon: [name], MD;   Location: [facility] ENDOSCOPY;  Service: Endoscopy;  Laterality: N/A;  . COLONOSCOPY WITH PROPOFOL N/A 04/15/2018   Procedure: COLONOSCOPY WITH PROPOFOL;  Surgeon: Lin Landsman, MD;  Location[facility] ENDOSCOPY;  Service: Gastroenterology;  Laterality: N/A;  . ESOPHAGOGASTRODUODENOSCOPY (EGD) WITH PROPOFOL N/A 04/15/2018   Procedure: ESOPHAGOGASTRODUODENOSCOPY (EGD) WITH PROPOFOL;  Surgeon: Lin Landsman, MD;  Location: [facility] ENDOSCOPY;  Service: Gastroenterology;  Laterality: N/A;  . FOOT SURGERY Right    X2  . SHOULDER SURGERY Right   . WRIST SURGERY Bilateral      A IV Location/Drains/Wounds Patient Lines/Drains/Airways Status   Active Line/Drains/Airways    Name:   Placement date:   Placement time:   Site:   Days:   Peripheral IV 10/27/18 Left Forearm   10/27/18    1726    Forearm   less than 1   Peripheral IV 10/27/18 Right Antecubital   10/27/18    1716    Antecubital   less than 1          Intake/Output Last 24 hours No intake or output data in the 24 hours ending 10/27/18 2040  Labs/Imaging Results for orders placed or performed during the hospital encounter of 10/27/18 (from the past 48 hour(s))  Lactic acid, plasma     Status: Abnormal   Collection Time: 10/27/18  3:57 PM  Result Value Ref Range   Lactic Acid, Venous 4.6 (HH) 0.5 - 1.9 mmol/L    Comment: CRITICAL RESULT CALLED TO, READ BACK BY AND VERIFIED WITH BILL  SMITH @1645  10/27/18 MJU Performed at [facility], [address] [place], [place] [address]   Comprehensive metabolic panel     Status: Abnormal   Collection Time: 10/27/18  3:57 PM  Result Value Ref Range   Sodium 139 135 - 145 mmol/L   Potassium 3.7 3.5 - 5.1 mmol/L   Chloride 101 98 - 111 mmol/L   CO2 23 22 - 32 mmol/L   Glucose, Bld 330 (H) 70 - 99 mg/dL   BUN 8 8 - 23 mg/dL   Creatinine, Ser 0.70 0.44 - 1.00 mg/dL   Calcium 9.1 8.9 - 10.3 mg/dL   Total Protein 7.6 6.5 - 8.1 g/dL   Albumin 3.7 3.5 - 5.0 g/dL   AST 46 (H) 15 - 41  U/L   ALT 33 0 - 44 U/L   Alkaline Phosphatase 72 38 - 126 U/L   Total Bilirubin 1.2 0.3 - 1.2 mg/dL   GFR calc non Af Amer >60 >60 mL/min   GFR calc Af Amer >60 >60 mL/min   Anion gap 15 5 - 15    Comment: Performed at [facility], [address] [place], [place] [address]  CBC with Differential     Status: Abnormal   Collection Time: 10/27/18  3:57 PM  Result Value Ref Range   WBC 8.6 4.0 - 10.5 K/uL   RBC 4.48 3.87 - 5.11 MIL/uL   Hemoglobin 14.3 12.0 - 15.0 g/dL   HCT 40.9 36.0 - 46.0 %   MCV 91.3 80.0 - 100.0 fL   MCH 31.9 26.0 - 34.0 pg   MCHC 35.0 30.0 - 36.0 g/dL   RDW 12.8 11.5 - 15.5 %   Platelets 179 150 - 400 K/uL   nRBC 0.0 0.0 - 0.2 %   Neutrophils Relative % 91 %   Neutro Abs 7.9 (H) 1.7 - 7.7 K/uL   Lymphocytes Relative 4 %   Lymphs Abs 0.3 (L) 0.7 - 4.0 K/uL   Monocytes Relative 3 %   Monocytes Absolute 0.3 0.1 - 1.0 K/uL   Eosinophils Relative 1 %   Eosinophils Absolute 0.0 0.0 - 0.5 K/uL   Basophils Relative 0 %   Basophils Absolute 0.0 0.0 - 0.1 K/uL   Immature Granulocytes 1 %   Abs Immature Granulocytes 0.04 0.00 - 0.07 K/uL    Comment: Performed at [facility], [address] [place], [place] [address]  Lactic acid, plasma     Status: Abnormal   Collection Time: 10/27/18  6:16 PM  Result Value Ref Range   Lactic Acid, Venous 3.9 (HH) 0.5 - 1.9 mmol/L    Comment: CRITICAL RESULT CALLED TO, READ BACK BY AND VERIFIED WITH BILL SMITH RN AT [id number] ON 10/27/2018 [facility] Performed at [facility], [address] [place], [place] [address]   APTT     Status: None   Collection Time: 10/27/18  6:16 PM  Result Value Ref Range   aPTT 30 24 - 36 seconds    Comment: Performed at [facility], [address] [place], [place] [address]   Dg Chest 2 View  Result Date: 10/27/2018 CLINICAL DATA:  Fever. Recent soft tissue infections. EXAM: CHEST - 2 VIEW COMPARISON:  01/29/2018 FINDINGS: Heart size is within normal limits  considering the AP technique. Pulmonary vascularity is normal. Lungs are clear. No effusions. No acute bone abnormality. IMPRESSION: No acute abnormalities. Electronically Signed   By: Lorriane Shire M.D.   On: 10/27/2018 16:31  Dg Tibia/fibula Left  Result Date: 10/27/2018 CLINICAL DATA:  Cellulitis. EXAM: LEFT TIBIA AND FIBULA - 2 VIEW COMPARISON:  None FINDINGS: Moderate to advanced degenerative changes are noted within the left knee. No acute fracture or dislocation identified. Mild diffuse soft tissue swelling. IMPRESSION: 1. No acute bone abnormality. 2. Soft tissue swelling 3. Left knee osteoarthritis. Electronically Signed   By: Kerby Moors M.D.   On: 10/27/2018 18:21   Dg Foot Complete Left  Result Date: 10/27/2018 Please see detailed radiograph report in office note.  Dg Toe Great Left  Result Date: 10/27/2018 CLINICAL DATA:  Cellulitis.  Status post treatment with antibiotics. EXAM: LEFT GREAT TOE COMPARISON:  None. FINDINGS: No acute fracture or dislocation identified no focal bone erosions. Degenerative changes at the first MTP joint. Mild diffuse soft tissue swelling. IMPRESSION: 1. Soft tissue swelling. 2. No acute bone abnormality. Electronically Signed   By: Kerby Moors M.D.   On: 10/27/2018 18:19    Pending Labs Unresulted Labs (From admission, onward)    Start     Ordered   10/28/18 0500  Creatinine, serum  Tomorrow morning,   STAT     10/27/18 2016   10/27/18 2001  Uric acid  Once,   STAT     10/27/18 2000   10/27/18 1952  CULTURE, BLOOD (ROUTINE X 2) w Reflex to ID Panel  BLOOD CULTURE X 2,   STAT     10/27/18 1951   10/27/18 1705  SARS CORONAVIRUS 2 (TAT 6-24 HRS) Nasopharyngeal Nasopharyngeal Swab  (Asymptomatic/Tier 2 Patients Labs)  Once,   STAT    Question Answer Comment  Is this test for diagnosis or screening Screening   Symptomatic for COVID-19 as defined by CDC No   Hospitalized for COVID-19 No   Admitted to ICU for COVID-19 No   Previously tested  for COVID-19 No   Resident in a congregate (group) care setting No   Employed in healthcare setting No   Pregnant No      10/27/18 1705   10/27/18 1704  Blood culture (single)  ONCE - STAT,   STAT     10/27/18 1703   10/27/18 1555  Urinalysis, Complete w Microscopic  ONCE - STAT,   STAT     10/27/18 1555   10/27/18 1555  Blood culture (single)  ONCE - STAT,   STAT     10/27/18 1555   Signed and Held  HIV antibody (Routine Testing)  Once,   R     Signed and Held   Signed and Held  Basic metabolic panel  Tomorrow morning,   R     Signed and Held   Signed and Held  CBC  Tomorrow morning,   R     Signed and Held   Signed and Held  Lactic acid, plasma  Tomorrow morning,   R     Signed and Held   Signed and Held  Hemoglobin A1c  Add-on,   R    Comments: To assess prior glycemic control    Signed and Held          Vitals/Pain Today's Vitals   10/27/18 1930 10/27/18 2000 10/27/18 2030 10/27/18 2030  BP: (!) 113/58 129/64 123/69   Pulse: 98 95 92   Resp: (!) 25 20 (!) 23   Temp:    98.5 F (36.9 C)  TempSrc:    Oral  SpO2: 95% 97% 100%   Weight:      Height:  PainSc:        Isolation Precautions No active isolations  Medications Medications  sodium chloride 0.9 % bolus 1,000 mL (has no administration in time range)  acetaminophen (TYLENOL) tablet 1,000 mg (1,000 mg Oral Given 10/27/18 1735)  ceFEPIme (MAXIPIME) 2 g in sodium chloride 0.9 % 100 mL IVPB (0 g Intravenous Stopped 10/27/18 1820)  metroNIDAZOLE (FLAGYL) IVPB 500 mg (0 mg Intravenous Stopped 10/27/18 2030)  vancomycin (VANCOCIN) 2,500 mg in sodium chloride 0.9 % 500 mL IVPB (2,500 mg Intravenous New Bag/Given 10/27/18 1829)  sodium chloride 0.9 % bolus 1,000 mL (1,000 mLs Intravenous New Bag/Given 10/27/18 1856)    Mobility walks with device Low fall risk   Focused Assessments    R Recommendations: See Admitting Provider Note  Report given to: Minette Brine, RN

## 2018-10-27 NOTE — Progress Notes (Signed)
Anticoagulation monitoring(Lovenox):  63 yo female ordered Lovenox 40 mg Q24h  Filed Weights   10/27/18 1553  Weight: 280 lb (127 kg)   BMI 42   Lab Results  Component Value Date   CREATININE 0.70 10/27/2018   CREATININE 0.58 10/26/2018   CREATININE 0.56 (L) 01/26/2018   Estimated Creatinine Clearance: 99.9 mL/min (by C-G formula based on SCr of 0.7 mg/dL). Hemoglobin & Hematocrit     Component Value Date/Time   HGB 14.3 10/27/2018 1557   HGB 14.2 10/26/2018 1123   HCT 40.9 10/27/2018 1557   HCT 41.8 10/26/2018 1123     Per Protocol for Patient with estCrcl > 30 ml/min and BMI > 40, will transition to Lovenox 40 mg Q12h.

## 2018-10-27 NOTE — ED Triage Notes (Signed)
Recent boil on back of right leg, + staph, took antibiotics for 7-10 days. Had follow up with PCP, patient had an area on left great toe, treated again with antibiotics.  Started running a fever today.  Patient states after first coarse of antibiotics, had similar symptoms and was septic.

## 2018-10-28 LAB — LIPID PANEL
Chol/HDL Ratio: 4.3 ratio (ref 0.0–4.4)
Cholesterol, Total: 116 mg/dL (ref 100–199)
HDL: 27 mg/dL — ABNORMAL LOW (ref >39)
LDL Chol Calc (NIH): 43 mg/dL (ref 0–99)
Triglycerides: 301 mg/dL — ABNORMAL HIGH (ref 0–149)
VLDL Cholesterol Cal: 46 mg/dL — ABNORMAL HIGH (ref 5–40)

## 2018-10-28 LAB — CBC
HCT: 35.4 % — ABNORMAL LOW (ref 36.0–46.0)
Hemoglobin: 12.2 g/dL (ref 12.0–15.0)
MCH: 31.4 pg (ref 26.0–34.0)
MCHC: 34.5 g/dL (ref 30.0–36.0)
MCV: 91.2 fL (ref 80.0–100.0)
Platelets: 153 10*3/uL (ref 150–400)
RBC: 3.88 MIL/uL (ref 3.87–5.11)
RDW: 13.2 % (ref 11.5–15.5)
WBC: 5.7 10*3/uL (ref 4.0–10.5)
nRBC: 0 % (ref 0.0–0.2)

## 2018-10-28 LAB — BASIC METABOLIC PANEL
Anion gap: 11 (ref 5–15)
BUN: 10 mg/dL (ref 8–23)
CO2: 24 mmol/L (ref 22–32)
Calcium: 8.2 mg/dL — ABNORMAL LOW (ref 8.9–10.3)
Chloride: 104 mmol/L (ref 98–111)
Creatinine, Ser: 0.51 mg/dL (ref 0.44–1.00)
GFR calc Af Amer: >60 mL/min (ref >60)
GFR calc non Af Amer: >60 mL/min (ref >60)
Glucose, Bld: 206 mg/dL — ABNORMAL HIGH (ref 70–99)
Potassium: 3.4 mmol/L — ABNORMAL LOW (ref 3.5–5.1)
Sodium: 139 mmol/L (ref 135–145)

## 2018-10-28 LAB — CBC WITH DIFFERENTIAL/PLATELET
Basophils Absolute: 0 10*3/uL (ref 0.0–0.2)
Basos: 1 %
EOS (ABSOLUTE): 0.1 10*3/uL (ref 0.0–0.4)
Eos: 2 %
Hematocrit: 41.8 % (ref 34.0–46.6)
Hemoglobin: 14.2 g/dL (ref 11.1–15.9)
Immature Grans (Abs): 0 10*3/uL (ref 0.0–0.1)
Immature Granulocytes: 0 %
Lymphocytes Absolute: 1.8 10*3/uL (ref 0.7–3.1)
Lymphs: 33 %
MCH: 32 pg (ref 26.6–33.0)
MCHC: 34 g/dL (ref 31.5–35.7)
MCV: 94 fL (ref 79–97)
Monocytes Absolute: 0.4 10*3/uL (ref 0.1–0.9)
Monocytes: 8 %
Neutrophils Absolute: 3 10*3/uL (ref 1.4–7.0)
Neutrophils: 56 %
Platelets: 188 10*3/uL (ref 150–450)
RBC: 4.44 x10E6/uL (ref 3.77–5.28)
RDW: 12.5 % (ref 11.7–15.4)
WBC: 5.4 10*3/uL (ref 3.4–10.8)

## 2018-10-28 LAB — GLUCOSE, CAPILLARY
Glucose-Capillary: 211 mg/dL — ABNORMAL HIGH (ref 70–99)
Glucose-Capillary: 238 mg/dL — ABNORMAL HIGH (ref 70–99)
Glucose-Capillary: 189 mg/dL — ABNORMAL HIGH (ref 70–99)
Glucose-Capillary: 202 mg/dL — ABNORMAL HIGH (ref 70–99)

## 2018-10-28 LAB — COMPREHENSIVE METABOLIC PANEL
ALT: 33 IU/L — ABNORMAL HIGH (ref 0–32)
AST: 48 IU/L — ABNORMAL HIGH (ref 0–40)
Albumin/Globulin Ratio: 1.3 (ref 1.2–2.2)
Albumin: 4.1 g/dL (ref 3.8–4.8)
Alkaline Phosphatase: 86 IU/L (ref 39–117)
BUN/Creatinine Ratio: 14 (ref 12–28)
BUN: 8 mg/dL (ref 8–27)
Bilirubin Total: 0.5 mg/dL (ref 0.0–1.2)
CO2: 19 mmol/L — ABNORMAL LOW (ref 20–29)
Calcium: 9.7 mg/dL (ref 8.7–10.3)
Chloride: 101 mmol/L (ref 96–106)
Creatinine, Ser: 0.58 mg/dL (ref 0.57–1.00)
GFR calc Af Amer: 113 mL/min/{1.73_m2} (ref >59)
GFR calc non Af Amer: 98 mL/min/{1.73_m2} (ref >59)
Globulin, Total: 3.2 g/dL (ref 1.5–4.5)
Glucose: 276 mg/dL — ABNORMAL HIGH (ref 65–99)
Potassium: 4.7 mmol/L (ref 3.5–5.2)
Sodium: 141 mmol/L (ref 134–144)
Total Protein: 7.3 g/dL (ref 6.0–8.5)

## 2018-10-28 LAB — HEMOGLOBIN A1C
Est. average glucose Bld gHb Est-mCnc: 203 mg/dL
Hgb A1c MFr Bld: 8.7 % — ABNORMAL HIGH (ref 4.8–5.6)

## 2018-10-28 LAB — VITAMIN B12: Vitamin B-12: >2000 pg/mL — ABNORMAL HIGH (ref 232–1245)

## 2018-10-28 LAB — LACTIC ACID, PLASMA: Lactic Acid, Venous: 2.4 mmol/L (ref 0.5–1.9)

## 2018-10-28 LAB — SARS CORONAVIRUS 2 (TAT 6-24 HRS): SARS Coronavirus 2: NEGATIVE

## 2018-10-28 LAB — VITAMIN D 25 HYDROXY (VIT D DEFICIENCY, FRACTURES): Vit D, 25-Hydroxy: 23.9 ng/mL — ABNORMAL LOW (ref 30.0–100.0)

## 2018-10-28 MED ORDER — INSULIN DETEMIR 100 UNIT/ML ~~LOC~~ SOLN
6.0000 [IU] | Freq: Every day | SUBCUTANEOUS | Status: DC
  Administered 2018-10-28: 6 [IU] via SUBCUTANEOUS
  Filled 2018-10-28 (×2): qty 0.06

## 2018-10-28 MED ORDER — POTASSIUM CHLORIDE CRYS ER 20 MEQ PO TBCR
40.0000 meq | EXTENDED_RELEASE_TABLET | Freq: Once | ORAL | Status: AC
  Administered 2018-10-28: 40 meq via ORAL
  Filled 2018-10-28: qty 2

## 2018-10-28 NOTE — Consult Note (Signed)
Pharmacy Antibiotic Note  Tina Short is a 63 y.o. female admitted on 10/27/2018 with sepsis.  Pharmacy has been consulted for Cefepime/Vancomycin dosing. Patient was seen by her PCP ~ 2 weeks ago for a boil on the posterior right calf in which the patient was treated with Bactrim and boil was cleared. However, patient noticed some drainage in another wound (toe wound) and was seen by the podiatrist in which she was prescribed Bactrim again following outpatient superficial I/D.  Vancomycin 2500 mg x1 dose and Cefepime were given in the ED.  BMI > 40  Plan: 1. Continue cefepime 2g Q8H  2. Continue Vancomycin 1000 mg Q12H.  AUC goal: 400-550 Expected AUC: 493. Cssmin 14.6  Pharmacy will continue to monitor serum creatinine with AM labs, and will order vancomycin levels when clinically appropriate.   Height: 5\' 8"  (172.7 cm) Weight: 280 lb (127 kg) IBW/kg (Calculated) : 63.9  Temp (24hrs), Avg:99.2 F (37.3 C), Min:98 F (36.7 C), Max:101.8 F (38.8 C)  Recent Labs  Lab 10/26/18 1123 10/27/18 1557 10/27/18 1816 10/28/18 0515  WBC 5.4 8.6  --  5.7  CREATININE 0.58 0.70  --  0.51  LATICACIDVEN  --  4.6* 3.9* 2.4*    Estimated Creatinine Clearance: 99.9 mL/min (by C-G formula based on SCr of 0.51 mg/dL).    Allergies  Allergen Reactions  . Abilify [Aripiprazole] Shortness Of Breath    Other reaction(s): Difficulty breathing  . Invokana [Canagliflozin] Itching    Yeast infections    Antimicrobials this admission: 09/16 Cefepime >>  09/16 Vancomycin >> 09/16 Metronidazole x 1 dose   Microbiology results: 9/16 Bcx: NG 9/16 COVID: neg  Thank you for allowing pharmacy to be a part of this patient's care.  Gerald Dexter, PharmD Pharmacy Resident  10/28/2018 12:31 PM

## 2018-10-28 NOTE — Progress Notes (Signed)
Inpatient Diabetes Program Recommendations  AACE/ADA: New Consensus Statement on Inpatient Glycemic Control   Target Ranges:  Prepandial:   less than 140 mg/dL      Peak postprandial:   less than 180 mg/dL (1-2 hours)      Critically ill patients:  140 - 180 mg/dL   Results for Tina Short, Tina Short (MRN [id number]) as of 10/28/2018 12:07  Ref. Range 10/27/2018 22:17 10/28/2018 08:26 10/28/2018 11:37  Glucose-Capillary Latest Ref Range: 70 - 99 mg/dL 254 (H) 211 (H) 238 (H)    Review of Glycemic Control  Diabetes history: DM2 Outpatient Diabetes medications: Metformin XR 1000 mg QAM, Ozempic 1 mg Qweek Current orders for Inpatient glycemic control: Novolog 0-9 units TID with meals, Novolog 0-5 units QHS  Inpatient Diabetes Program Recommendations:   Insulin-Basal: Please consider ordering Levemir 6 units Q24H.  Thanks, Barnie Alderman, RN, MSN, CDE Diabetes Coordinator Inpatient Diabetes Program [phone] (Team Pager from 8am to 5pm)

## 2018-10-28 NOTE — Consult Note (Signed)
Reason for Consult: Ulceration with cellulitis left great toe. Referring Physician: Marguerette Garski Patient is an 63 y.o. female.  HPI: This is a 63 year old diabetic female with some degree of neuropathy.  Relates an abscessed area on the back of her right leg a couple of weeks ago and then more recently was seen by her primary care and there was a sore on the bottom of her left great toe.  Was seen by Dr. Amalia Hailey at [facility] a couple of days ago who debrided the area.  States she has been on some antibiotics.  Was admitted to the hospital with evidence for sepsis.  Past Medical History:  Diagnosis Date  . Depression   . Diabetes ([place])   . Hyperlipidemia   . Hypertension   . Neuropathy due to type 2 diabetes mellitus ([place])   . Obesity, morbid ([place]) 11/15/2009  . Sleep apnea    cpap  . Swelling     Past Surgical History:  Procedure Laterality Date  . ABDOMINAL HYSTERECTOMY    . COLONOSCOPY    . COLONOSCOPY WITH PROPOFOL N/A 05/14/2016   Procedure: COLONOSCOPY WITH PROPOFOL;  Surgeon: [name], MD;  Location: [facility] ENDOSCOPY;  Service: Endoscopy;  Laterality: N/A;  . COLONOSCOPY WITH PROPOFOL N/A 04/15/2018   Procedure: COLONOSCOPY WITH PROPOFOL;  Surgeon: Lin Landsman, MD;  Location: [facility] ENDOSCOPY;  Service: Gastroenterology;  Laterality: N/A;  . ESOPHAGOGASTRODUODENOSCOPY (EGD) WITH PROPOFOL N/A 04/15/2018   Procedure: ESOPHAGOGASTRODUODENOSCOPY (EGD) WITH PROPOFOL;  Surgeon: Lin Landsman, MD;  Location: [facility] ENDOSCOPY;  Service: Gastroenterology;  Laterality: N/A;  . FOOT SURGERY Right    X2  . SHOULDER SURGERY Right   . WRIST SURGERY Bilateral     Family History  Problem Relation Age of Onset  . Diabetes Mother   . Cancer Mother        uterine  . Mental illness Father   . Obesity Daughter   . Diabetes Maternal Grandmother   . Heart disease Brother   . Obesity Daughter     Social History:  reports that she has never smoked. She has never  used smokeless tobacco. She reports that she does not drink alcohol or use drugs.  Allergies:  Allergies  Allergen Reactions  . Abilify [Aripiprazole] Shortness Of Breath    Other reaction(s): Difficulty breathing  . Invokana [Canagliflozin] Itching    Yeast infections    Medications:  Scheduled: . allopurinol  100 mg Oral BID  . atorvastatin  40 mg Oral QHS  . docusate sodium  100 mg Oral BID  . enoxaparin (LOVENOX) injection  40 mg Subcutaneous Q12H  . insulin aspart  0-5 Units Subcutaneous QHS  . insulin aspart  0-9 Units Subcutaneous TID WC  . venlafaxine XR  300 mg Oral Q breakfast    Results for orders placed or performed during the hospital encounter of 10/27/18 (from the past 48 hour(s))  Lactic acid, plasma     Status: Abnormal   Collection Time: 10/27/18  3:57 PM  Result Value Ref Range   Lactic Acid, Venous 4.6 (HH) 0.5 - 1.9 mmol/L    Comment: CRITICAL RESULT CALLED TO, READ BACK BY AND VERIFIED WITH BILL SMITH @1645  10/27/18 MJU Performed at [facility], [address] [place], [place] [address]   Comprehensive metabolic panel     Status: Abnormal   Collection Time: 10/27/18  3:57 PM  Result Value Ref Range   Sodium 139 135 - 145 mmol/L  Potassium 3.7 3.5 - 5.1 mmol/L   Chloride 101 98 - 111 mmol/L   CO2 23 22 - 32 mmol/L   Glucose, Bld 330 (H) 70 - 99 mg/dL   BUN 8 8 - 23 mg/dL   Creatinine, Ser 0.70 0.44 - 1.00 mg/dL   Calcium 9.1 8.9 - 10.3 mg/dL   Total Protein 7.6 6.5 - 8.1 g/dL   Albumin 3.7 3.5 - 5.0 g/dL   AST 46 (H) 15 - 41 U/L   ALT 33 0 - 44 U/L   Alkaline Phosphatase 72 38 - 126 U/L   Total Bilirubin 1.2 0.3 - 1.2 mg/dL   GFR calc non Af Amer >60 >60 mL/min   GFR calc Af Amer >60 >60 mL/min   Anion gap 15 5 - 15    Comment: Performed at [facility], [address] [place], [place] [address]  CBC with Differential     Status: Abnormal   Collection Time: 10/27/18  3:57 PM  Result Value Ref Range   WBC 8.6 4.0 - 10.5  K/uL   RBC 4.48 3.87 - 5.11 MIL/uL   Hemoglobin 14.3 12.0 - 15.0 g/dL   HCT 40.9 36.0 - 46.0 %   MCV 91.3 80.0 - 100.0 fL   MCH 31.9 26.0 - 34.0 pg   MCHC 35.0 30.0 - 36.0 g/dL   RDW 12.8 11.5 - 15.5 %   Platelets 179 150 - 400 K/uL   nRBC 0.0 0.0 - 0.2 %   Neutrophils Relative % 91 %   Neutro Abs 7.9 (H) 1.7 - 7.7 K/uL   Lymphocytes Relative 4 %   Lymphs Abs 0.3 (L) 0.7 - 4.0 K/uL   Monocytes Relative 3 %   Monocytes Absolute 0.3 0.1 - 1.0 K/uL   Eosinophils Relative 1 %   Eosinophils Absolute 0.0 0.0 - 0.5 K/uL   Basophils Relative 0 %   Basophils Absolute 0.0 0.0 - 0.1 K/uL   Immature Granulocytes 1 %   Abs Immature Granulocytes 0.04 0.00 - 0.07 K/uL    Comment: Performed at [facility], [address] [place], [place] [address]  Blood culture (single)     Status: None (Preliminary result)   Collection Time: 10/27/18  3:57 PM   Specimen: BLOOD  Result Value Ref Range   Specimen Description BLOOD LEFT ANTECUBITAL    Special Requests Blood Culture adequate volume    Culture      NO GROWTH < 24 HOURS Performed at [facility], [address] [place], [place] [address]    Report Status PENDING   Uric acid     Status: None   Collection Time: 10/27/18  3:57 PM  Result Value Ref Range   Uric Acid, Serum 5.3 2.5 - 7.1 mg/dL    Comment: Performed at [facility], [address] [place], [place] [address]  Blood culture (single)     Status: None (Preliminary result)   Collection Time: 10/27/18  5:04 PM   Specimen: BLOOD  Result Value Ref Range   Specimen Description BLOOD RIGHT ANTECUBITAL    Special Requests      BOTTLES DRAWN AEROBIC AND ANAEROBIC Blood Culture results may not be optimal due to an inadequate volume of blood received in culture bottles   Culture      NO GROWTH < 24 HOURS Performed at [facility], [address], [place], [place] [address]    Report Status PENDING   Lactic acid, plasma  Status: Abnormal    Collection Time: 10/27/18  6:16 PM  Result Value Ref Range   Lactic Acid, Venous 3.9 (HH) 0.5 - 1.9 mmol/L    Comment: CRITICAL RESULT CALLED TO, READ BACK BY AND VERIFIED WITH BILL SMITH RN AT 1851 ON 10/27/2018 [facility] Performed at [facility], [address] [place], [place] [address]   APTT     Status: None   Collection Time: 10/27/18  6:16 PM  Result Value Ref Range   aPTT 30 24 - 36 seconds    Comment: Performed at [facility], [address] [place], [place] [address]  SARS CORONAVIRUS 2 (TAT 6-24 HRS) Nasopharyngeal Nasopharyngeal Swab     Status: None   Collection Time: 10/27/18  6:32 PM   Specimen: Nasopharyngeal Swab  Result Value Ref Range   SARS Coronavirus 2 NEGATIVE NEGATIVE    Comment: (NOTE) SARS-CoV-2 target nucleic acids are NOT DETECTED. The SARS-CoV-2 RNA is generally detectable in upper and lower respiratory specimens during the acute phase of infection. Negative results do not preclude SARS-CoV-2 infection, do not rule out co-infections with other pathogens, and should not be used as the sole basis for treatment or other patient management decisions. Negative results must be combined with clinical observations, patient history, and epidemiological information. The expected result is Negative. Fact Sheet for Patients: SugarRoll.be Fact Sheet for Healthcare Providers: https://www.woods-mathews.com/ This test is not yet approved or cleared by the [place] FDA and  has been authorized for detection and/or diagnosis of SARS-CoV-2 by FDA under an Emergency Use Authorization (EUA). This EUA will remain  in effect (meaning this test can be used) for the duration of the COVID-19 declaration under Section 56 4(b)(1) of the Act, 21 U.S.C. section 360bbb-3(b)(1), unless the authorization is terminated or revoked sooner. Performed at [facility], [place] [address], [place], [place] [address]    Glucose, capillary     Status: Abnormal   Collection Time: 10/27/18 10:17 PM  Result Value Ref Range   Glucose-Capillary 254 (H) 70 - 99 mg/dL  Lactic acid, plasma     Status: Abnormal   Collection Time: 10/28/18  5:15 AM  Result Value Ref Range   Lactic Acid, Venous 2.4 (HH) 0.5 - 1.9 mmol/L    Comment: CRITICAL VALUE NOTED. VALUE IS CONSISTENT WITH PREVIOUSLY REPORTED/CALLED VALUE [facility] Performed at [facility], [address] [place], [place] [id number]   Basic metabolic panel     Status: Abnormal   Collection Time: 10/28/18  5:15 AM  Result Value Ref Range   Sodium 139 135 - 145 mmol/L   Potassium 3.4 (L) 3.5 - 5.1 mmol/L   Chloride 104 98 - 111 mmol/L   CO2 24 22 - 32 mmol/L   Glucose, Bld 206 (H) 70 - 99 mg/dL   BUN 10 8 - 23 mg/dL   Creatinine, Ser 0.51 0.44 - 1.00 mg/dL   Calcium 8.2 (L) 8.9 - 10.3 mg/dL   GFR calc non Af Amer >60 >60 mL/min   GFR calc Af Amer >60 >60 mL/min   Anion gap 11 5 - 15    Comment: Performed at [facility], [address] [place], [place] [address]  CBC     Status: Abnormal   Collection Time: 10/28/18  5:15 AM  Result Value Ref Range   WBC 5.7 4.0 - 10.5 K/uL   RBC 3.88 3.87 - 5.11 MIL/uL   Hemoglobin 12.2 12.0 - 15.0 g/dL   HCT 35.4 (  L) 36.0 - 46.0 %   MCV 91.2 80.0 - 100.0 fL   MCH 31.4 26.0 - 34.0 pg   MCHC 34.5 30.0 - 36.0 g/dL   RDW 13.2 11.5 - 15.5 %   Platelets 153 150 - 400 K/uL   nRBC 0.0 0.0 - 0.2 %    Comment: Performed at [facility], [address] [place], [place] [address]  Glucose, capillary     Status: Abnormal   Collection Time: 10/28/18  8:26 AM  Result Value Ref Range   Glucose-Capillary 211 (H) 70 - 99 mg/dL  Glucose, capillary     Status: Abnormal   Collection Time: 10/28/18 11:37 AM  Result Value Ref Range   Glucose-Capillary 238 (H) 70 - 99 mg/dL    Dg Chest 2 View  Result Date: 10/27/2018 CLINICAL DATA:  Fever. Recent soft tissue infections. EXAM: CHEST - 2 VIEW  COMPARISON:  01/29/2018 FINDINGS: Heart size is within normal limits considering the AP technique. Pulmonary vascularity is normal. Lungs are clear. No effusions. No acute bone abnormality. IMPRESSION: No acute abnormalities. Electronically Signed   By: Lorriane Shire M.D.   On: 10/27/2018 16:31   Dg Tibia/fibula Left  Result Date: 10/27/2018 CLINICAL DATA:  Cellulitis. EXAM: LEFT TIBIA AND FIBULA - 2 VIEW COMPARISON:  None FINDINGS: Moderate to advanced degenerative changes are noted within the left knee. No acute fracture or dislocation identified. Mild diffuse soft tissue swelling. IMPRESSION: 1. No acute bone abnormality. 2. Soft tissue swelling 3. Left knee osteoarthritis. Electronically Signed   By: Kerby Moors M.D.   On: 10/27/2018 18:21   Dg Foot Complete Left  Result Date: 10/27/2018 Please see detailed radiograph report in office note.  Dg Toe Great Left  Result Date: 10/27/2018 CLINICAL DATA:  Cellulitis.  Status post treatment with antibiotics. EXAM: LEFT GREAT TOE COMPARISON:  None. FINDINGS: No acute fracture or dislocation identified no focal bone erosions. Degenerative changes at the first MTP joint. Mild diffuse soft tissue swelling. IMPRESSION: 1. Soft tissue swelling. 2. No acute bone abnormality. Electronically Signed   By: Kerby Moors M.D.   On: 10/27/2018 18:19    Review of Systems  Constitutional: Negative for chills and fever.  HENT: Negative for congestion and sore throat.   Eyes: Negative for blurred vision and double vision.  Respiratory: Negative for cough and shortness of breath.   Cardiovascular: Negative for chest pain and palpitations.  Gastrointestinal: Negative for nausea and vomiting.  Genitourinary: Negative for dysuria and urgency.  Musculoskeletal:       Has had some pain in the left great toe.  Skin:       Recent sore on her left great toe that was debrided yesterday outpatient.  Has had some drainage and been bandaging.  Neurological:        She does relate some numbness and paresthesias related to her diabetic neuropathy.  Endo/Heme/Allergies: Does not bruise/bleed easily.  Psychiatric/Behavioral: Negative for depression. The patient is nervous/anxious.    Blood pressure 140/71, pulse 85, temperature 98.3 F (36.8 C), temperature source Oral, resp. rate 18, height 5\' 8"  (1.727 m), weight 127 kg, SpO2 96 %. Physical Exam  Cardiovascular:  DP and PT pulses are palpable.  Musculoskeletal:     Comments: Adequate range of motion of the pedal joints.  Muscle testing deferred.  Neurological:  Some mild impairment of protective threshold with a monofilament wire, not always feeling the monofilament.  Proprioception impaired.  Skin:  The skin is warm dry and  supple.  There is some mild diffuse erythema and edema in the left hallux as compared to the right.  Superficial granular plantar ulceration approximately 1.5 cm x 2.5 cm with a small central area of maceration but no clear deep extension.  Minimal drainage with no signs of any purulence.    Assessment/Plan: Assessment: 1.  Superficial ulceration left hallux with cellulitis. 2.  Diabetes with associated neuropathy.  Plan: Sterile gauze bandage applied.  Discussed with the patient I do not see any significant signs of infection.  Discussed daily dressing changes with antibiotic ointment and a light bandage.  Should be stable for discharge on oral antibiotics as far as her toe is concerned.  Follow-up with Dr. Amalia Hailey outpatient as scheduled next week on discharge.  Durward Fortes 10/28/2018, 12:23 PM

## 2018-10-28 NOTE — Progress Notes (Signed)
[facility] [place] NAME: Tina Short    MR#:  [id number]  DATE OF BIRTH:  20-Oct-1955  SUBJECTIVE:  CHIEF COMPLAINT:   Chief Complaint  Patient presents with  . Fever   No new complaint this morning.  Had fever with temperature of 101.8 yesterday.  Seen by podiatrist this morning.  REVIEW OF SYSTEMS:  ROS   Constitutional: Positive for chills, fever. Negative for weight loss.  HENT: Negative for ear discharge, ear pain, hearing loss and nosebleeds.   Eyes: Negative for blurred vision, double vision and photophobia.  Respiratory: Negative for cough, hemoptysis, shortness of breath and wheezing.   Cardiovascular: Negative for chest pain, palpitations, orthopnea and leg swelling.  Gastrointestinal: Negative for nausea . Negative for abdominal pain, constipation, diarrhea, heartburn, melena and vomiting.  Genitourinary: Negative for dysuria, frequency, hematuria and urgency.  Musculoskeletal: Negative for. Negative for back pain and neck pain.  Skin: Negative for rash.  Neurological: Negative for dizziness, tingling, tremors, sensory change, speech change, focal weakness and headaches.  Endo/Heme/Allergies: Does not bruise/bleed easily.  Psychiatric/Behavioral: Negative for depression.    DRUG ALLERGIES:   Allergies  Allergen Reactions  . Abilify [Aripiprazole] Shortness Of Breath    Other reaction(s): Difficulty breathing  . Invokana [Canagliflozin] Itching    Yeast infections   VITALS:  Blood pressure 140/71, pulse 85, temperature 98.3 F (36.8 C), temperature source Oral, resp. rate 18, height 5\' 8"  (1.727 m), weight 127 kg, SpO2 96 %. PHYSICAL EXAMINATION:  Physical Exam  GENERAL:  63 y.o.-year-old obese patient lying in the bed with no acute distress.  EYES: Pupils equal, round, reactive to light and accommodation. No scleral icterus. Extraocular muscles intact.  HEENT: Head atraumatic, normocephalic. Oropharynx and  nasopharynx clear.  NECK:  Supple, no jugular venous distention. No thyroid enlargement, no tenderness.  LUNGS: Normal breath sounds bilaterally, no wheezing, rales,rhonchi or crepitation. No use of accessory muscles of respiration.  Decreased bibasilar breath sounds. CARDIOVASCULAR: S1, S2 normal. No murmurs, rubs, or gallops.  ABDOMEN: Soft, nontender, nondistended. Bowel sounds present. No organomegaly or mass.  EXTREMITIES: No  cyanosis, or clubbing.  Bilateral 2+ pedal edema.  Right calf wound is healed.  Left foot first toe plantar surface wound with superficial debridement, no discharge noted.  Erythema of the toe extending onto the foot and then proximal leg noted. NEUROLOGIC: Cranial nerves II through XII are intact. Muscle strength 5/5 in all extremities. Sensation intact. Gait not checked.  PSYCHIATRIC: The patient is alert and oriented x 3.  SKIN: No obvious rash, lesion, or ulcer.   LABORATORY PANEL:  Female CBC Recent Labs  Lab 10/28/18 0515  WBC 5.7  HGB 12.2  HCT 35.4*  PLT 153   ------------------------------------------------------------------------------------------------------------------ Chemistries  Recent Labs  Lab 10/27/18 1557 10/28/18 0515  NA 139 139  K 3.7 3.4*  CL 101 104  CO2 23 24  GLUCOSE 330* 206*  BUN 8 10  CREATININE 0.70 0.51  CALCIUM 9.1 8.2*  AST 46*  --   ALT 33  --   ALKPHOS 72  --   BILITOT 1.2  --    RADIOLOGY:  Dg Chest 2 View  Result Date: 10/27/2018 CLINICAL DATA:  Fever. Recent soft tissue infections. EXAM: CHEST - 2 VIEW COMPARISON:  01/29/2018 FINDINGS: Heart size is within normal limits considering the AP technique. Pulmonary vascularity is normal. Lungs are clear. No effusions. No acute bone abnormality. IMPRESSION: No acute abnormalities. Electronically Signed  By: Lorriane Shire M.D.   On: 10/27/2018 16:31   Dg Tibia/fibula Left  Result Date: 10/27/2018 CLINICAL DATA:  Cellulitis. EXAM: LEFT TIBIA AND FIBULA - 2 VIEW  COMPARISON:  None FINDINGS: Moderate to advanced degenerative changes are noted within the left knee. No acute fracture or dislocation identified. Mild diffuse soft tissue swelling. IMPRESSION: 1. No acute bone abnormality. 2. Soft tissue swelling 3. Left knee osteoarthritis. Electronically Signed   By: Kerby Moors M.D.   On: 10/27/2018 18:21   Dg Foot Complete Left  Result Date: 10/27/2018 Please see detailed radiograph report in office note.  Dg Toe Great Left  Result Date: 10/27/2018 CLINICAL DATA:  Cellulitis.  Status post treatment with antibiotics. EXAM: LEFT GREAT TOE COMPARISON:  None. FINDINGS: No acute fracture or dislocation identified no focal bone erosions. Degenerative changes at the first MTP joint. Mild diffuse soft tissue swelling. IMPRESSION: 1. Soft tissue swelling. 2. No acute bone abnormality. Electronically Signed   By: Kerby Moors M.D.   On: 10/27/2018 18:19   ASSESSMENT AND PLAN:   Tina Short  is a 63 y.o. female with a known history of diabetes mellitus, sleep apnea, peripheral neuropathy, hypertension and depression presents from home secondary to worsening fevers and chills associated with left foot pain and redness  1.  Sepsis-secondary to left toe and foot cellulitis.  X-ray negative for osteomyelitis. -Follow blood cultures.   -Vancomycin and cefepime.  Seen by podiatrist.  Does not see any significant signs of infection.  Continue dressing.  Will transition to p.o. antibiotics on discharge possibly tomorrow and follow-up with Dr. Amalia Hailey as outpatient  2.  Uncontrolled diabetes mellitus Blood sugars uncontrolled.  Placed on Levemir insulin 6 units daily Glycosylated hemoglobin level of 8.7. Metformin on hold.  Sliding scale insulin coverage.  3.  Depression-on Effexor  4.  Gout-stable Patient on allopurinol.  DVT prophylaxis-Lovenox  All the records are reviewed and case discussed with Care Management/Social Worker. Management plans discussed  with the patient, family and they are in agreement.  CODE STATUS: Full Code  TOTAL TIME TAKING CARE OF THIS PATIENT: 34 minutes.   More than 50% of the time was spent in counseling/coordination of care: YES  POSSIBLE D/C IN 2 DAYS, DEPENDING ON CLINICAL CONDITION.   Tina Short M.D on 10/28/2018 at 1:40 PM  Between 7am to 6pm - Pager - [phone]  After 6pm go to [email or web address] - [employer] [facility]  Office  [phone]  CC: Primary care physician; Steele Sizer, MD  Note: This dictation was prepared with Dragon dictation along with smaller phrase technology. Any transcriptional errors that result from this process are unintentional.

## 2018-10-29 LAB — CBC
HCT: 34.5 % — ABNORMAL LOW (ref 36.0–46.0)
Hemoglobin: 11.8 g/dL — ABNORMAL LOW (ref 12.0–15.0)
MCH: 31.7 pg (ref 26.0–34.0)
MCHC: 34.2 g/dL (ref 30.0–36.0)
MCV: 92.7 fL (ref 80.0–100.0)
Platelets: 152 10*3/uL (ref 150–400)
RBC: 3.72 MIL/uL — ABNORMAL LOW (ref 3.87–5.11)
RDW: 13 % (ref 11.5–15.5)
WBC: 4.3 10*3/uL (ref 4.0–10.5)
nRBC: 0 % (ref 0.0–0.2)

## 2018-10-29 LAB — BASIC METABOLIC PANEL
Anion gap: 8 (ref 5–15)
BUN: 9 mg/dL (ref 8–23)
CO2: 25 mmol/L (ref 22–32)
Calcium: 8.1 mg/dL — ABNORMAL LOW (ref 8.9–10.3)
Chloride: 107 mmol/L (ref 98–111)
Creatinine, Ser: 0.52 mg/dL (ref 0.44–1.00)
GFR calc Af Amer: >60 mL/min (ref >60)
GFR calc non Af Amer: >60 mL/min (ref >60)
Glucose, Bld: 178 mg/dL — ABNORMAL HIGH (ref 70–99)
Potassium: 3.3 mmol/L — ABNORMAL LOW (ref 3.5–5.1)
Sodium: 140 mmol/L (ref 135–145)

## 2018-10-29 LAB — HEMOGLOBIN A1C
Hgb A1c MFr Bld: 8.7 % — ABNORMAL HIGH (ref 4.8–5.6)
Mean Plasma Glucose: 203 mg/dL

## 2018-10-29 LAB — WOUND CULTURE

## 2018-10-29 LAB — HIV ANTIBODY (ROUTINE TESTING W REFLEX): HIV Screen 4th Generation wRfx: NONREACTIVE

## 2018-10-29 LAB — GLUCOSE, CAPILLARY: Glucose-Capillary: 181 mg/dL — ABNORMAL HIGH (ref 70–99)

## 2018-10-29 LAB — MAGNESIUM: Magnesium: 1.6 mg/dL — ABNORMAL LOW (ref 1.7–2.4)

## 2018-10-29 MED ORDER — MAGNESIUM SULFATE 2 GM/50ML IV SOLN
2.0000 g | Freq: Once | INTRAVENOUS | Status: AC
  Administered 2018-10-29: 2 g via INTRAVENOUS
  Filled 2018-10-29: qty 50

## 2018-10-29 MED ORDER — CLINDAMYCIN HCL 150 MG PO CAPS: 300.0000 mg | ORAL_CAPSULE | Freq: Three times a day (TID) | ORAL | 0 refills | Status: AC

## 2018-10-29 MED ORDER — BACITRACIN-NEOMYCIN-POLYMYXIN 400-5-5000 EX OINT: 1.0000 "application " | TOPICAL_OINTMENT | Freq: Two times a day (BID) | CUTANEOUS | 0 refills | Status: AC

## 2018-10-29 MED ORDER — POTASSIUM CHLORIDE CRYS ER 20 MEQ PO TBCR
40.0000 meq | EXTENDED_RELEASE_TABLET | Freq: Once | ORAL | Status: AC
  Administered 2018-10-29: 08:00:00 40 meq via ORAL
  Filled 2018-10-29: qty 2

## 2018-10-29 NOTE — Progress Notes (Signed)
Pt. Discharged to home via family vehicle. Discharge instructions and medication regimen reviewed at bedside with patient. Pt. verbalizes understanding of instructions and medication regimen. Hard scripts given to pt. Patient assessment unchanged from this morning. IV discontinued per policy.

## 2018-10-29 NOTE — Discharge Summary (Signed)
[facility] NAME: Tina Short    MR#:  [id number]  DATE OF BIRTH:  06/19/1955  DATE OF ADMISSION:  10/27/2018   ADMITTING PHYSICIAN: Gladstone Lighter, MD  DATE OF DISCHARGE: 10/29/2018  PRIMARY CARE PHYSICIAN: Steele Sizer, MD   ADMISSION DIAGNOSIS:  Cellulitis of left lower extremity [L03.116] Sepsis, due to unspecified organism, unspecified whether acute organ dysfunction present ([place]) [A41.9] DISCHARGE DIAGNOSIS:  Active Problems:   Sepsis ([place])  SECONDARY DIAGNOSIS:   Past Medical History:  Diagnosis Date  . Depression   . Diabetes ([place])   . Hyperlipidemia   . Hypertension   . Neuropathy due to type 2 diabetes mellitus ([place])   . Obesity, morbid ([place]) 11/15/2009  . Sleep apnea    cpap  . Swelling    HOSPITAL COURSE:  Chief complaint; fever  History of presenting complaint; Tina Short  is a 63 y.o. female with a known history of diabetes mellitus, sleep apnea, peripheral neuropathy, hypertension and depression presented from home secondary to worsening fevers and chills associated with left foot pain and redness. Patient went to see her PCP about 2 weeks ago for a boil on the posterior right calf for which she was treated with Bactrim and resulted in complete clearing of the third boil.  At that time she was also noted to have a left foot first toe plantar surface wound with minimal drainage.  And she was referred to see a podiatrist.  She has seen the podiatrist 1 day prior to this admission and had superficial debridement done in the office and the podiatrist advised her to take Bactrim again for 2 weeks.  Patient was fine all day yesterday.  This morning she woke up, felt tired but went to work.  Had to come from work because she was started having fevers and chills and malaise all of a sudden. In the ED she was noted to be febrile, tachycardic though her white count is normal.  Elevated lactic acid.  X-ray of  the first toe did not show any bony involvement.  She has cellulitis of the foot extending onto the leg at this time.  Patient was admitted to medical service.   Hospital course; 1. Sepsis-secondary to left toe and foot cellulitis. X-ray negative for osteomyelitis.  So far no growth on cultures.  Patient was treated empirically with IV vancomycin and cefepime during this admission. Seen by podiatrist.  Does not see any significant signs of infection.  Continue dressing with topical antibiotics.  Podiatrist has given clearance to discharge on p.o. antibiotics today.  Patient to follow-up with podiatrist Dr. Amalia Hailey as outpatient.  Patient reports significant improvement clinically and also wishes to be discharged home today.  Prescription for p.o. clindamycin given. 2. Uncontrolled diabetes mellitus Blood sugars better controlled this morning.  Patient to resume home regimen for her diabetes.  Follow-up with primary care physician for monitoring and adjustment of diabetic regimen.  Glycosylated hemoglobin level of 8.7.  3. Depression-on Effexor 4. Gout-stablePatient on allopurinol.    DISCHARGE CONDITIONS:  Stable CONSULTS OBTAINED:   DRUG ALLERGIES:   Allergies  Allergen Reactions  . Abilify [Aripiprazole] Shortness Of Breath    Other reaction(s): Difficulty breathing  . Invokana [Canagliflozin] Itching    Yeast infections   DISCHARGE MEDICATIONS:   Allergies as of 10/29/2018      Reactions   Abilify [aripiprazole] Shortness Of Breath   Other reaction(s): Difficulty breathing   Invokana [canagliflozin] Itching  Yeast infections      Medication List    STOP taking these medications   sulfamethoxazole-trimethoprim 800-160 MG tablet Commonly known as: BACTRIM DS     TAKE these medications   allopurinol 100 MG tablet Commonly known as: ZYLOPRIM Take 1 tablet (100 mg total) by mouth 2 (two) times daily.   atorvastatin 40 MG tablet Commonly known as: LIPITOR Take 1  tablet (40 mg total) by mouth at bedtime.   clindamycin 150 MG capsule Commonly known as: Cleocin Take 2 capsules (300 mg total) by mouth 3 (three) times daily for 5 days.   furosemide 40 MG tablet Commonly known as: LASIX Daily What changed:   how much to take  how to take this  when to take this  reasons to take this   losartan 100 MG tablet Commonly known as: COZAAR Take 1 tablet (100 mg total) by mouth daily.   metFORMIN 500 MG 24 hr tablet Commonly known as: GLUCOPHAGE-XR Take 2 tablets (1,000 mg total) by mouth daily with breakfast.   neomycin-bacitracin-polymyxin ointment Commonly known as: NEOSPORIN Apply 1 application topically every 12 (twelve) hours for 7 days. Apply to the toe daily cover with gauze with dressing.   omega-3 acid ethyl esters 1 g capsule Commonly known as: LOVAZA TAKE 2 CAPSULES BY MOUTH  TWO TIMES DAILY What changed:   how much to take  how to take this  when to take this   Ozempic (1 MG/DOSE) 2 MG/1.5ML Sopn Generic drug: Semaglutide (1 MG/DOSE) Inject 1 mg into the skin once a week.   potassium chloride SA 20 MEQ tablet Commonly known as: K-DUR TAKE 1 TABLET(20 MEQ) BY MOUTH DAILY What changed:   how much to take  how to take this  when to take this  reasons to take this   venlafaxine XR 150 MG 24 hr capsule Commonly known as: EFFEXOR-XR Take 2 capsules (300 mg total) by mouth daily with breakfast. One daily with breakfast.   Vitamin D (Ergocalciferol) 1.25 MG (50000 UT) Caps capsule Commonly known as: DRISDOL Take 1 capsule (50,000 Units total) by mouth every 7 (seven) days. Weekly        DISCHARGE INSTRUCTIONS:   DIET:  Diabetic diet DISCHARGE CONDITION:  Stable ACTIVITY:  Activity as tolerated OXYGEN:  Home Oxygen: No.  Oxygen Delivery: room air DISCHARGE LOCATION:  home   If you experience worsening of your admission symptoms, develop shortness of breath, life threatening emergency, suicidal or  homicidal thoughts you must seek medical attention immediately by calling 911 or calling your MD immediately  if symptoms less severe.  You Must read complete instructions/literature along with all the possible adverse reactions/side effects for all the Medicines you take and that have been prescribed to you. Take any new Medicines after you have completely understood and accpet all the possible adverse reactions/side effects.   Please note  You were cared for by a hospitalist during your hospital stay. If you have any questions about your discharge medications or the care you received while you were in the hospital after you are discharged, you can call the unit and asked to speak with the hospitalist on call if the hospitalist that took care of you is not available. Once you are discharged, your primary care physician will handle any further medical issues. Please note that NO REFILLS for any discharge medications will be authorized once you are discharged, as it is imperative that you return to your primary care physician (or establish a  relationship with a primary care physician if you do not have one) for your aftercare needs so that they can reassess your need for medications and monitor your lab values.    On the day of Discharge:  VITAL SIGNS:  Blood pressure (!) 143/78, pulse 75, temperature 98.3 F (36.8 C), temperature source Oral, resp. rate 17, height 5\' 8"  (1.727 m), weight 127 kg, SpO2 96 %. PHYSICAL EXAMINATION:  GENERAL:  63 y.o.-year-old patient lying in the bed with no acute distress.  EYES: Pupils equal, round, reactive to light and accommodation. No scleral icterus. Extraocular muscles intact.  HEENT: Head atraumatic, normocephalic. Oropharynx and nasopharynx clear.  NECK:  Supple, no jugular venous distention. No thyroid enlargement, no tenderness.  LUNGS: Normal breath sounds bilaterally, no wheezing, rales,rhonchi or crepitation. No use of accessory muscles of respiration.   CARDIOVASCULAR: S1, S2 normal. No murmurs, rubs, or gallops.  ABDOMEN: Soft, non-tender, non-distended. Bowel sounds present. No organomegaly or mass.  EXTREMITIES: Erythema on the left great toe significantly improved.  No pedal edema, cyanosis, or clubbing.  NEUROLOGIC: Cranial nerves II through XII are intact. Muscle strength 5/5 in all extremities. Sensation intact. Gait not checked.  PSYCHIATRIC: The patient is alert and oriented x 3.  SKIN: No obvious rash, lesion,  DATA REVIEW:   CBC Recent Labs  Lab 10/29/18 0412  WBC 4.3  HGB 11.8*  HCT 34.5*  PLT 152    Chemistries  Recent Labs  Lab 10/27/18 1557  10/29/18 0412  NA 139   < > 140  K 3.7   < > 3.3*  CL 101   < > 107  CO2 23   < > 25  GLUCOSE 330*   < > 178*  BUN 8   < > 9  CREATININE 0.70   < > 0.52  CALCIUM 9.1   < > 8.1*  MG  --   --  1.6*  AST 46*  --   --   ALT 33  --   --   ALKPHOS 72  --   --   BILITOT 1.2  --   --    < > = values in this interval not displayed.     Microbiology Results  Results for orders placed or performed during the hospital encounter of 10/27/18  Blood culture (single)     Status: None (Preliminary result)   Collection Time: 10/27/18  3:57 PM   Specimen: BLOOD  Result Value Ref Range Status   Specimen Description BLOOD LEFT ANTECUBITAL  Final   Special Requests Blood Culture adequate volume  Final   Culture   Final    NO GROWTH 2 DAYS Performed at [facility], [address], [place], [place] [address]    Report Status PENDING  Incomplete  Blood culture (single)     Status: None (Preliminary result)   Collection Time: 10/27/18  5:04 PM   Specimen: BLOOD  Result Value Ref Range Status   Specimen Description BLOOD RIGHT ANTECUBITAL  Final   Special Requests   Final    BOTTLES DRAWN AEROBIC AND ANAEROBIC Blood Culture results may not be optimal due to an inadequate volume of blood received in culture bottles   Culture   Final    NO GROWTH 2 DAYS Performed at  [facility], [address] [place], [place] [address]    Report Status PENDING  Incomplete  SARS CORONAVIRUS 2 (TAT 6-24 HRS) Nasopharyngeal Nasopharyngeal Swab     Status: None  Collection Time: 10/27/18  6:32 PM   Specimen: Nasopharyngeal Swab  Result Value Ref Range Status   SARS Coronavirus 2 NEGATIVE NEGATIVE Final    Comment: (NOTE) SARS-CoV-2 target nucleic acids are NOT DETECTED. The SARS-CoV-2 RNA is generally detectable in upper and lower respiratory specimens during the acute phase of infection. Negative results do not preclude SARS-CoV-2 infection, do not rule out co-infections with other pathogens, and should not be used as the sole basis for treatment or other patient management decisions. Negative results must be combined with clinical observations, patient history, and epidemiological information. The expected result is Negative. Fact Sheet for Patients: SugarRoll.be Fact Sheet for Healthcare Providers: https://www.woods-mathews.com/ This test is not yet approved or cleared by the [place] FDA and  has been authorized for detection and/or diagnosis of SARS-CoV-2 by FDA under an Emergency Use Authorization (EUA). This EUA will remain  in effect (meaning this test can be used) for the duration of the COVID-19 declaration under Section 56 4(b)(1) of the Act, 21 U.S.C. section 360bbb-3(b)(1), unless the authorization is terminated or revoked sooner. Performed at [facility], [place] [address] [place], [place] [address]     RADIOLOGY:  No results found.   Management plans discussed with the patient, family and they are in agreement.  CODE STATUS: Full Code   TOTAL TIME TAKING CARE OF THIS PATIENT: 36 minutes.    Basya Casavant M.D on 10/29/2018 at 10:36 AM  Between 7am to 6pm - Pager - [phone]  After 6pm go to [email or web address] - Technical brewer [facility]  Office   [phone]  CC: Primary care physician; Steele Sizer, MD   Note: This dictation was prepared with Dragon dictation along with smaller phrase technology. Any transcriptional errors that result from this process are unintentional.

## 2018-10-29 NOTE — Progress Notes (Signed)
   Subjective:  63 y.o. female with PMHx of diabetes mellitus type 2, referred by Dr. Ancil Boozer, with a chief complaint of a lesion noted to the left great toe that appeared a few weeks ago. She was told by Dr. Ancil Boozer that the pain is caused from a plantar wart. She now reports a new wound next to the old lesion. She denies significant pain secondary to neuropathy. She reports taking Bactrim DS recently for a staph infection of the RLE. She denies modifying factors. Patient is here for further evaluation and treatment.   Past Medical History:  Diagnosis Date  . Depression   . Diabetes ([place])   . Hyperlipidemia   . Hypertension   . Neuropathy due to type 2 diabetes mellitus ([place])   . Obesity, morbid ([place]) 11/15/2009  . Sleep apnea    cpap  . Swelling       Objective/Physical Exam General: The patient is alert and oriented x3 in no acute distress.  Dermatology:  Wound #1 noted to the left great toe measuring 2.0 x 3.0 x 0.2 cm (LxWxD).   To the noted ulceration(s), there is no eschar. There is a moderate amount of slough, fibrin, and necrotic tissue noted. Granulation tissue and wound base is red. There is a minimal amount of serosanguineous drainage noted. There is no exposed bone muscle-tendon ligament or joint. There is no malodor. Periwound integrity is intact. Skin is warm, dry and supple bilateral lower extremities.  Vascular: Palpable pedal pulses bilaterally. No edema or erythema noted. Capillary refill within normal limits.  Neurological: Epicritic and protective threshold diminished bilaterally.   Musculoskeletal Exam: Range of motion within normal limits to all pedal and ankle joints bilateral. Muscle strength 5/5 in all groups bilateral.   Radiographic Exam: Hallux IPJ sesamoid noted contributory to the overlying ulceration.   Assessment: 1. Ulceration of the left great toe secondary to diabetes mellitus 2. diabetes mellitus w/ peripheral neuropathy   Plan of Care:  1.  Patient was evaluated. X-Rays reviewed.  2. medically necessary excisional debridement including subcutaneous tissue was performed using a tissue nipper and a chisel blade. Excisional debridement of all the necrotic nonviable tissue down to healthy bleeding viable tissue was performed with post-debridement measurements same as pre-. 3. the wound was cleansed and dry sterile dressing applied. 4. Culture taken from the wound.  5. Prescription for Bactrim DS provided to patient.  6. Recommended Betadine daily.  7. Resume using CAM boot at home.  8. Patient is to return to clinic in 2 weeks.   Edrick Kins, DPM [facility]  Dr. Edrick Kins, [address]                                        [place], [place] [address]                Office [phone]  Fax [phone]

## 2018-11-01 ENCOUNTER — Telehealth: Payer: Self-pay

## 2018-11-01 LAB — CULTURE, BLOOD (SINGLE)
Culture: NO GROWTH
Culture: NO GROWTH
Special Requests: ADEQUATE

## 2018-11-01 LAB — GLUCOSE, CAPILLARY: Glucose-Capillary: 217 mg/dL — ABNORMAL HIGH (ref 70–99)

## 2018-11-01 NOTE — Telephone Encounter (Signed)
Transition Care Management Follow-up Telephone Call  Date of discharge and from where: 10/29/18 [facility]  How have you been since you were released from the hospital? Pt states she is doing okay, denies pain but waiting for toe to heal   Any questions or concerns? No   Items Reviewed:  Did the pt receive and understand the discharge instructions provided? Yes   Medications obtained and verified? Yes   Any new allergies since your discharge? No   Dietary orders reviewed? Yes  Do you have support at home? Yes   Functional Questionnaire: (I = Independent and D = Dependent) ADLs: I  Bathing/Dressing- I  Meal Prep- I  Eating- I  Maintaining continence- I  Transferring/Ambulation- I  Managing Meds- I  Follow up appointments reviewed:   PCP Hospital f/u appt confirmed? Yes  Scheduled to see Dr. Ancil Boozer on 11/03/18 @ 10:20.  [facility] f/u appt confirmed? Yes  Scheduled to see Dr. Amalia Hailey on 11/12/18.  Are transportation arrangements needed? No   If their condition worsens, is the pt aware to call PCP or go to the Emergency Dept.? Yes  Was the patient provided with contact information for the PCP's office or ED? Yes  Was to pt encouraged to call back with questions or concerns? Yes

## 2018-11-03 ENCOUNTER — Encounter: Payer: Self-pay | Admitting: Family Medicine

## 2018-11-03 ENCOUNTER — Other Ambulatory Visit: Payer: Self-pay

## 2018-11-03 ENCOUNTER — Ambulatory Visit: Payer: Managed Care, Other (non HMO) | Admitting: Family Medicine

## 2018-11-03 VITALS — BP 134/78 | HR 95 | Temp 97.1°F | Resp 16 | Ht 68.0 in | Wt 288.1 lb

## 2018-11-03 DIAGNOSIS — L03032 Cellulitis of left toe: Secondary | ICD-10-CM

## 2018-11-03 DIAGNOSIS — L97509 Non-pressure chronic ulcer of other part of unspecified foot with unspecified severity: Secondary | ICD-10-CM

## 2018-11-03 DIAGNOSIS — E1140 Type 2 diabetes mellitus with diabetic neuropathy, unspecified: Secondary | ICD-10-CM | POA: Diagnosis not present

## 2018-11-03 DIAGNOSIS — E11621 Type 2 diabetes mellitus with foot ulcer: Secondary | ICD-10-CM

## 2018-11-03 DIAGNOSIS — Z09 Encounter for follow-up examination after completed treatment for conditions other than malignant neoplasm: Secondary | ICD-10-CM

## 2018-11-03 DIAGNOSIS — R790 Abnormal level of blood mineral: Secondary | ICD-10-CM

## 2018-11-03 DIAGNOSIS — T3695XA Adverse effect of unspecified systemic antibiotic, initial encounter: Secondary | ICD-10-CM

## 2018-11-03 DIAGNOSIS — IMO0002 Reserved for concepts with insufficient information to code with codable children: Secondary | ICD-10-CM

## 2018-11-03 DIAGNOSIS — E1165 Type 2 diabetes mellitus with hyperglycemia: Secondary | ICD-10-CM

## 2018-11-03 DIAGNOSIS — Z1231 Encounter for screening mammogram for malignant neoplasm of breast: Secondary | ICD-10-CM

## 2018-11-03 DIAGNOSIS — B379 Candidiasis, unspecified: Secondary | ICD-10-CM

## 2018-11-03 MED ORDER — FLUCONAZOLE 150 MG PO TABS: 150.0000 mg | ORAL_TABLET | ORAL | 0 refills | Status: DC

## 2018-11-03 MED ORDER — BLOOD GLUCOSE METER KIT: PACK | 0 refills | Status: AC

## 2018-11-03 NOTE — Progress Notes (Signed)
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 02/08/2018 01/29/2018  Falls in the past year? 0 0 0 1 1  Number falls in past yr: 0 0 0 1 1  Injury with Fall? 0 0 0 1 1  Risk for fall due to : - - - - History of fall(s)     Functional Status Survey: Is the patient deaf or have difficulty hearing?: No Does the patient have difficulty seeing, even when wearing glasses/contacts?: No Does the patient have difficulty concentrating, remembering, or making  decisions?: No Does the patient have difficulty walking or climbing stairs?: No Does the patient have difficulty dressing or bathing?: No Does the patient have difficulty doing errands alone such as visiting a doctor's office or shopping?: No    Assessment & Plan   1. Hospital discharge follow-up  Reviewed records, and discussed with patient, keep follow up with podiatrist and area cleaned as instructed. Check labs today   2. Cellulitis of toe of left foot  - CBC with Differential/Platelet - Comprehensive metabolic panel  3. Type 2 diabetes mellitus, uncontrolled, with neuropathy (HCC)  - blood glucose meter kit and supplies; Dispense based on patient and insurance preference. Use up to four times daily as directed. (FOR ICD-10 E10.9, E11.9).  Dispense: 1 each; Refill: 0  4. Uncontrolled diabetes mellitus with foot ulcer ([place])  - Comprehensive metabolic panel - blood glucose meter kit and supplies; Dispense based on patient and insurance preference. Use up to four times daily as directed. (FOR ICD-10 E10.9, E11.9).  Dispense: 1 each; Refill: 0  5. Antibiotic-induced yeast infection  - fluconazole (DIFLUCAN) 150 MG tablet; Take 1 tablet (150 mg total) by mouth every other day.  Dispense: 3 tablet; Refill: 0  6. Low magnesium level  - Magnesium

## 2018-11-07 ENCOUNTER — Other Ambulatory Visit: Payer: Self-pay | Admitting: Family Medicine

## 2018-11-12 ENCOUNTER — Encounter: Payer: Self-pay | Admitting: Podiatry

## 2018-11-12 ENCOUNTER — Ambulatory Visit: Payer: Managed Care, Other (non HMO) | Admitting: Podiatry

## 2018-11-12 ENCOUNTER — Other Ambulatory Visit: Payer: Self-pay

## 2018-11-12 DIAGNOSIS — L97522 Non-pressure chronic ulcer of other part of left foot with fat layer exposed: Secondary | ICD-10-CM | POA: Diagnosis not present

## 2018-11-12 DIAGNOSIS — E0843 Diabetes mellitus due to underlying condition with diabetic autonomic (poly)neuropathy: Secondary | ICD-10-CM

## 2018-11-16 NOTE — Progress Notes (Signed)
   Subjective:  63 y.o. female with PMHx of diabetes mellitus type 2 presenting today for follow up evaluation of an ulceration of the left great toe. She states she was admitted to [facility] for cellulitis since her last visit. She states she is doing well now. She reports finishing a course of Keflex last week. There are no modifying factors noted. Patient is here for further evaluation and treatment.   Past Medical History:  Diagnosis Date  . Depression   . Diabetes ([place])   . Hyperlipidemia   . Hypertension   . Neuropathy due to type 2 diabetes mellitus ([place])   . Obesity, morbid ([place]) 11/15/2009  . Sleep apnea    cpap  . Swelling       Objective/Physical Exam General: The patient is alert and oriented x3 in no acute distress.  Dermatology:  Wound #1 noted to the left great toe measuring 1.5 x 1.0 x 0.2 cm (LxWxD).   To the noted ulceration(s), there is no eschar. There is a moderate amount of slough, fibrin, and necrotic tissue noted. Granulation tissue and wound base is red. There is a minimal amount of serosanguineous drainage noted. There is no exposed bone muscle-tendon ligament or joint. There is no malodor. Periwound integrity is intact. Skin is warm, dry and supple bilateral lower extremities.  Vascular: Palpable pedal pulses bilaterally. No edema or erythema noted. Capillary refill within normal limits.  Neurological: Epicritic and protective threshold diminished bilaterally.   Musculoskeletal Exam: Range of motion within normal limits to all pedal and ankle joints bilateral. Muscle strength 5/5 in all groups bilateral.     Assessment: 1. Ulceration of the left great toe secondary to diabetes mellitus 2. diabetes mellitus w/ peripheral neuropathy   Plan of Care:  1. Patient was evaluated. 2. medically necessary excisional debridement including subcutaneous tissue was performed using a tissue nipper and a chisel blade. Excisional debridement of all the necrotic  nonviable tissue down to healthy bleeding viable tissue was performed with post-debridement measurements same as pre-. 3. the wound was cleansed and dry sterile dressing applied. 4. Recommended Betadine ointment daily with a bandage.  5. Post op shoe dispensed.  6. Return to clinic in 3 weeks.    Edrick Kins, DPM [facility]  Dr. Edrick Kins, [place]                                        [place], [place] [address]                Office [phone]  Fax [phone]

## 2018-11-21 ENCOUNTER — Other Ambulatory Visit: Payer: Self-pay | Admitting: Family Medicine

## 2018-11-21 DIAGNOSIS — F339 Major depressive disorder, recurrent, unspecified: Secondary | ICD-10-CM

## 2018-11-24 ENCOUNTER — Encounter: Payer: Managed Care, Other (non HMO) | Admitting: Family Medicine

## 2018-12-29 ENCOUNTER — Ambulatory Visit: Payer: Managed Care, Other (non HMO) | Admitting: Family Medicine

## 2019-01-03 ENCOUNTER — Encounter: Payer: Managed Care, Other (non HMO) | Admitting: Family Medicine

## 2019-01-28 ENCOUNTER — Ambulatory Visit: Payer: Managed Care, Other (non HMO) | Admitting: Family Medicine

## 2019-01-31 ENCOUNTER — Encounter: Payer: Managed Care, Other (non HMO) | Admitting: Family Medicine

## 2019-02-26 ENCOUNTER — Other Ambulatory Visit: Payer: Self-pay | Admitting: Family Medicine

## 2019-02-26 DIAGNOSIS — R809 Proteinuria, unspecified: Secondary | ICD-10-CM

## 2019-02-26 DIAGNOSIS — E559 Vitamin D deficiency, unspecified: Secondary | ICD-10-CM

## 2019-02-26 DIAGNOSIS — E1129 Type 2 diabetes mellitus with other diabetic kidney complication: Secondary | ICD-10-CM

## 2019-02-26 DIAGNOSIS — E1140 Type 2 diabetes mellitus with diabetic neuropathy, unspecified: Secondary | ICD-10-CM

## 2019-02-26 DIAGNOSIS — IMO0002 Reserved for concepts with insufficient information to code with codable children: Secondary | ICD-10-CM

## 2019-02-27 NOTE — Telephone Encounter (Signed)
Requested medication (s) are due for refill today: yes  Requested medication (s) are on the active medication list: yes  Last refill:  08/27/18 #12 with 1 refill  Future visit scheduled: yes  Notes to clinic:  Refill not delegated per protocol    Requested Prescriptions  Pending Prescriptions Disp Refills   Vitamin D, Ergocalciferol, (DRISDOL) 1.25 MG (50000 UNIT) CAPS capsule [Pharmacy Med Name: VITAMIN D2 50,000IU (ERGO) CAP RX] 12 capsule 1    Sig: TAKE ONE CAPSULE BY MOUTH EVERY 7 DAYS      Endocrinology:  Vitamins - Vitamin D Supplementation Failed - 02/26/2019 10:23 AM      Failed - 50,000 IU strengths are not delegated      Failed - Ca in normal range and within 360 days    Calcium  Date Value Ref Range Status  10/29/2018 8.1 (L) 8.9 - 10.3 mg/dL Final          Failed - Phosphate in normal range and within 360 days    No results found for: PHOS        Failed - Vitamin D in normal range and within 360 days    Vit D, 25-Hydroxy  Date Value Ref Range Status  10/26/2018 23.9 (L) 30.0 - 100.0 ng/mL Final    Comment:    Vitamin D deficiency has been defined by the Institute of Medicine and an Endocrine Society practice guideline as a level of serum 25-OH vitamin D less than 20 ng/mL (1,2). The Endocrine Society went on to further define vitamin D insufficiency as a level between 21 and 29 ng/mL (2). 1. IOM (Institute of Medicine). 2010. Dietary reference    intakes for calcium and D. Effort: The    [employer]. 2. Holick MF, Binkley , Bischoff-Ferrari HA, et al.    Evaluation, treatment, and prevention of vitamin D    deficiency: an Endocrine Society clinical practice    guideline. JCEM. 2011 Jul; 96(7):1911-30.           Passed - Valid encounter within last 12 months    Recent Outpatient Visits           3 months ago Hospital discharge follow-up   [facility] Steele Sizer, MD   4 months ago Abscess   [facility] [place], Drue Stager, MD   6 months ago Morbid obesity, unspecified obesity type [facility])   [facility] Steele Sizer, MD   1 year ago UTI symptoms   [place], NP   1 year ago Type 2 diabetes mellitus with microalbuminuria, without long-term current use of insulin [facility])   [facility] [place], Drue Stager, MD       Future Appointments             In 2 weeks Steele Sizer, MD [facility]             Signed Prescriptions Disp Refills   metFORMIN (GLUCOPHAGE-XR) 500 MG 24 hr tablet 180 tablet 1    Sig: TAKE 2 TABLETS(1000 MG) BY MOUTH DAILY WITH BREAKFAST      Endocrinology:  Diabetes - Biguanides Failed - 02/26/2019 10:23 AM      Failed - HBA1C is between 0 and 7.9 and within 180 days    HbA1c, POC (prediabetic range)  Date Value Ref Range Status  01/29/2018 6.7 (A) 5.7 - 6.4 % Final   HbA1c, POC (controlled diabetic range)  Date Value Ref Range Status  01/29/2018 6.7 0.0 - 7.0 % Final   HbA1c POC (<> result, manual entry)  Date Value Ref Range Status  01/29/2018 6.7 4.0 - 5.6 % Final   Hgb A1c MFr Bld  Date Value Ref Range Status  10/27/2018 8.7 (H) 4.8 - 5.6 % Final    Comment:    (NOTE)         Prediabetes: 5.7 - 6.4         Diabetes: >6.4         Glycemic control for adults with diabetes: <7.0           Passed - Cr in normal range and within 360 days    Creatinine, Ser  Date Value Ref Range Status  10/29/2018 0.52 0.44 - 1.00 mg/dL Final          Passed - eGFR in normal range and within 360 days    GFR calc Af Amer  Date Value Ref Range Status  10/29/2018 >60 >60 mL/min Final   GFR calc non Af Amer  Date Value Ref Range Status  10/29/2018 >60 >60 mL/min Final          Passed - Valid encounter within last 6 months    Recent Outpatient Visits           3 months ago Hospital discharge follow-up   [facility] Sizer, MD   4 months ago Abscess   [facility] Steele Sizer, MD   6 months ago Morbid obesity, unspecified obesity type [facility])   [facility] Steele Sizer, MD   1 year ago UTI symptoms   [place], NP   1 year ago Type 2 diabetes mellitus with microalbuminuria, without long-term current use of insulin [facility])   [facility] Steele Sizer, MD       Future Appointments             In 2 weeks Steele Sizer, MD [facility]

## 2019-03-18 ENCOUNTER — Ambulatory Visit (INDEPENDENT_AMBULATORY_CARE_PROVIDER_SITE_OTHER): Payer: Managed Care, Other (non HMO) | Admitting: Family Medicine

## 2019-03-18 ENCOUNTER — Encounter: Payer: Self-pay | Admitting: Family Medicine

## 2019-03-18 ENCOUNTER — Other Ambulatory Visit: Payer: Self-pay

## 2019-03-18 VITALS — BP 126/74 | HR 93 | Temp 97.7°F | Resp 16 | Ht 67.0 in | Wt 287.0 lb

## 2019-03-18 DIAGNOSIS — F339 Major depressive disorder, recurrent, unspecified: Secondary | ICD-10-CM | POA: Diagnosis not present

## 2019-03-18 DIAGNOSIS — E1169 Type 2 diabetes mellitus with other specified complication: Secondary | ICD-10-CM

## 2019-03-18 DIAGNOSIS — M109 Gout, unspecified: Secondary | ICD-10-CM

## 2019-03-18 DIAGNOSIS — E1140 Type 2 diabetes mellitus with diabetic neuropathy, unspecified: Secondary | ICD-10-CM

## 2019-03-18 DIAGNOSIS — IMO0002 Reserved for concepts with insufficient information to code with codable children: Secondary | ICD-10-CM

## 2019-03-18 DIAGNOSIS — E785 Hyperlipidemia, unspecified: Secondary | ICD-10-CM

## 2019-03-18 DIAGNOSIS — L304 Erythema intertrigo: Secondary | ICD-10-CM

## 2019-03-18 DIAGNOSIS — I10 Essential (primary) hypertension: Secondary | ICD-10-CM | POA: Diagnosis not present

## 2019-03-18 DIAGNOSIS — E1165 Type 2 diabetes mellitus with hyperglycemia: Secondary | ICD-10-CM | POA: Diagnosis not present

## 2019-03-18 DIAGNOSIS — E782 Mixed hyperlipidemia: Secondary | ICD-10-CM

## 2019-03-18 DIAGNOSIS — Z0000 Encounter for general adult medical examination without abnormal findings: Secondary | ICD-10-CM

## 2019-03-18 LAB — POCT GLYCOSYLATED HEMOGLOBIN (HGB A1C): Hemoglobin A1C: 8.1 % — AB (ref 4.0–5.6)

## 2019-03-18 MED ORDER — ALLOPURINOL 100 MG PO TABS: 100.0000 mg | ORAL_TABLET | Freq: Two times a day (BID) | ORAL | 1 refills | Status: DC

## 2019-03-18 MED ORDER — HYDROCORTISONE 0.5 % EX CREA: 1.0000 "application " | TOPICAL_CREAM | Freq: Two times a day (BID) | CUTANEOUS | 0 refills | Status: DC

## 2019-03-18 MED ORDER — LOSARTAN POTASSIUM 100 MG PO TABS: 100.0000 mg | ORAL_TABLET | Freq: Every day | ORAL | 1 refills | Status: DC

## 2019-03-18 MED ORDER — OZEMPIC (0.25 OR 0.5 MG/DOSE) 2 MG/1.5ML ~~LOC~~ SOPN: 0.5000 mg | PEN_INJECTOR | SUBCUTANEOUS | 0 refills | Status: DC

## 2019-03-18 MED ORDER — VENLAFAXINE HCL ER 150 MG PO CP24: 150.0000 mg | ORAL_CAPSULE | Freq: Every day | ORAL | 0 refills | Status: DC

## 2019-03-18 MED ORDER — ATORVASTATIN CALCIUM 40 MG PO TABS: 40.0000 mg | ORAL_TABLET | Freq: Every day | ORAL | 1 refills | Status: DC

## 2019-03-18 MED ORDER — CLOTRIMAZOLE 1 % EX CREA: 1.0000 "application " | TOPICAL_CREAM | Freq: Two times a day (BID) | CUTANEOUS | 0 refills | Status: DC

## 2019-03-18 NOTE — Patient Instructions (Signed)
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
## 2019-03-19 LAB — COMPREHENSIVE METABOLIC PANEL
ALT: 34 IU/L — ABNORMAL HIGH (ref 0–32)
AST: 35 IU/L (ref 0–40)
Albumin/Globulin Ratio: 1.3 (ref 1.2–2.2)
Albumin: 4.1 g/dL (ref 3.8–4.8)
Alkaline Phosphatase: 82 IU/L (ref 39–117)
BUN/Creatinine Ratio: 18 (ref 12–28)
BUN: 10 mg/dL (ref 8–27)
Bilirubin Total: 0.8 mg/dL (ref 0.0–1.2)
CO2: 23 mmol/L (ref 20–29)
Calcium: 9.5 mg/dL (ref 8.7–10.3)
Chloride: 101 mmol/L (ref 96–106)
Creatinine, Ser: 0.55 mg/dL — ABNORMAL LOW (ref 0.57–1.00)
GFR calc Af Amer: 115 mL/min/{1.73_m2} (ref >59)
GFR calc non Af Amer: 99 mL/min/{1.73_m2} (ref >59)
Globulin, Total: 3.2 g/dL (ref 1.5–4.5)
Glucose: 267 mg/dL — ABNORMAL HIGH (ref 65–99)
Potassium: 4.3 mmol/L (ref 3.5–5.2)
Sodium: 141 mmol/L (ref 134–144)
Total Protein: 7.3 g/dL (ref 6.0–8.5)

## 2019-03-19 LAB — LIPID PANEL
Chol/HDL Ratio: 4.8 ratio — ABNORMAL HIGH (ref 0.0–4.4)
Cholesterol, Total: 163 mg/dL (ref 100–199)
HDL: 34 mg/dL — ABNORMAL LOW (ref >39)
LDL Chol Calc (NIH): 74 mg/dL (ref 0–99)
Triglycerides: 347 mg/dL — ABNORMAL HIGH (ref 0–149)
VLDL Cholesterol Cal: 55 mg/dL — ABNORMAL HIGH (ref 5–40)

## 2019-03-19 LAB — CBC WITH DIFFERENTIAL/PLATELET
Basophils Absolute: 0 10*3/uL (ref 0.0–0.2)
Basos: 1 %
EOS (ABSOLUTE): 0.1 10*3/uL (ref 0.0–0.4)
Eos: 1 %
Hematocrit: 44.5 % (ref 34.0–46.6)
Hemoglobin: 15.4 g/dL (ref 11.1–15.9)
Immature Grans (Abs): 0 10*3/uL (ref 0.0–0.1)
Immature Granulocytes: 0 %
Lymphocytes Absolute: 1.9 10*3/uL (ref 0.7–3.1)
Lymphs: 32 %
MCH: 31.9 pg (ref 26.6–33.0)
MCHC: 34.6 g/dL (ref 31.5–35.7)
MCV: 92 fL (ref 79–97)
Monocytes Absolute: 0.5 10*3/uL (ref 0.1–0.9)
Monocytes: 8 %
Neutrophils Absolute: 3.3 10*3/uL (ref 1.4–7.0)
Neutrophils: 58 %
Platelets: 185 10*3/uL (ref 150–450)
RBC: 4.83 x10E6/uL (ref 3.77–5.28)
RDW: 14 % (ref 11.7–15.4)
WBC: 5.8 10*3/uL (ref 3.4–10.8)

## 2019-03-19 LAB — HEMOGLOBIN A1C
Est. average glucose Bld gHb Est-mCnc: 197 mg/dL
Hgb A1c MFr Bld: 8.5 % — ABNORMAL HIGH (ref 4.8–5.6)

## 2019-03-19 LAB — MAGNESIUM: Magnesium: 1.7 mg/dL (ref 1.6–2.3)

## 2019-03-19 LAB — VITAMIN D 25 HYDROXY (VIT D DEFICIENCY, FRACTURES): Vit D, 25-Hydroxy: 20.4 ng/mL — ABNORMAL LOW (ref 30.0–100.0)

## 2019-03-19 LAB — VITAMIN B12: Vitamin B-12: 1696 pg/mL — ABNORMAL HIGH (ref 232–1245)

## 2019-03-20 ENCOUNTER — Other Ambulatory Visit: Payer: Self-pay | Admitting: Family Medicine

## 2019-03-20 MED ORDER — FENOFIBRATE 145 MG PO TABS: 145.0000 mg | ORAL_TABLET | Freq: Every day | ORAL | 1 refills | Status: DC

## 2019-04-21 ENCOUNTER — Encounter: Payer: Self-pay | Admitting: Internal Medicine

## 2019-04-21 ENCOUNTER — Inpatient Hospital Stay: Payer: Managed Care, Other (non HMO)

## 2019-04-21 ENCOUNTER — Ambulatory Visit: Payer: Managed Care, Other (non HMO) | Admitting: Internal Medicine

## 2019-04-21 ENCOUNTER — Inpatient Hospital Stay
Admission: EM | Admit: 2019-04-21 | Discharge: 2019-04-25 | DRG: 603 | Disposition: A | Discharge status: Home / Self Care | Payer: Managed Care, Other (non HMO) | Attending: Internal Medicine | Admitting: Internal Medicine

## 2019-04-21 ENCOUNTER — Other Ambulatory Visit: Payer: Self-pay

## 2019-04-21 ENCOUNTER — Telehealth: Payer: Self-pay

## 2019-04-21 VITALS — BP 108/70 | HR 103 | Temp 97.6°F | Resp 16 | Ht 68.0 in | Wt 279.2 lb

## 2019-04-21 DIAGNOSIS — Z8619 Personal history of other infectious and parasitic diseases: Secondary | ICD-10-CM

## 2019-04-21 DIAGNOSIS — Z87440 Personal history of urinary (tract) infections: Secondary | ICD-10-CM

## 2019-04-21 DIAGNOSIS — Z8349 Family history of other endocrine, nutritional and metabolic diseases: Secondary | ICD-10-CM

## 2019-04-21 DIAGNOSIS — R42 Dizziness and giddiness: Secondary | ICD-10-CM

## 2019-04-21 DIAGNOSIS — R7881 Bacteremia: Secondary | ICD-10-CM | POA: Diagnosis present

## 2019-04-21 DIAGNOSIS — E1140 Type 2 diabetes mellitus with diabetic neuropathy, unspecified: Secondary | ICD-10-CM | POA: Diagnosis present

## 2019-04-21 DIAGNOSIS — I10 Essential (primary) hypertension: Secondary | ICD-10-CM | POA: Diagnosis present

## 2019-04-21 DIAGNOSIS — Z6841 Body Mass Index (BMI) 40.0 and over, adult: Secondary | ICD-10-CM

## 2019-04-21 DIAGNOSIS — D696 Thrombocytopenia, unspecified: Secondary | ICD-10-CM | POA: Diagnosis present

## 2019-04-21 DIAGNOSIS — E872 Acidosis: Secondary | ICD-10-CM | POA: Diagnosis present

## 2019-04-21 DIAGNOSIS — R6883 Chills (without fever): Secondary | ICD-10-CM

## 2019-04-21 DIAGNOSIS — Z8249 Family history of ischemic heart disease and other diseases of the circulatory system: Secondary | ICD-10-CM | POA: Diagnosis not present

## 2019-04-21 DIAGNOSIS — E876 Hypokalemia: Secondary | ICD-10-CM | POA: Diagnosis not present

## 2019-04-21 DIAGNOSIS — E559 Vitamin D deficiency, unspecified: Secondary | ICD-10-CM | POA: Diagnosis present

## 2019-04-21 DIAGNOSIS — M109 Gout, unspecified: Secondary | ICD-10-CM | POA: Diagnosis present

## 2019-04-21 DIAGNOSIS — L03116 Cellulitis of left lower limb: Secondary | ICD-10-CM

## 2019-04-21 DIAGNOSIS — Z79899 Other long term (current) drug therapy: Secondary | ICD-10-CM

## 2019-04-21 DIAGNOSIS — N390 Urinary tract infection, site not specified: Secondary | ICD-10-CM | POA: Diagnosis present

## 2019-04-21 DIAGNOSIS — Z90710 Acquired absence of both cervix and uterus: Secondary | ICD-10-CM

## 2019-04-21 DIAGNOSIS — F339 Major depressive disorder, recurrent, unspecified: Secondary | ICD-10-CM | POA: Diagnosis present

## 2019-04-21 DIAGNOSIS — L0390 Cellulitis, unspecified: Secondary | ICD-10-CM | POA: Diagnosis present

## 2019-04-21 DIAGNOSIS — R600 Localized edema: Secondary | ICD-10-CM

## 2019-04-21 DIAGNOSIS — Z8049 Family history of malignant neoplasm of other genital organs: Secondary | ICD-10-CM

## 2019-04-21 DIAGNOSIS — Z20822 Contact with and (suspected) exposure to covid-19: Secondary | ICD-10-CM | POA: Diagnosis present

## 2019-04-21 DIAGNOSIS — E1169 Type 2 diabetes mellitus with other specified complication: Secondary | ICD-10-CM | POA: Diagnosis present

## 2019-04-21 DIAGNOSIS — G4733 Obstructive sleep apnea (adult) (pediatric): Secondary | ICD-10-CM | POA: Diagnosis present

## 2019-04-21 DIAGNOSIS — E1165 Type 2 diabetes mellitus with hyperglycemia: Secondary | ICD-10-CM

## 2019-04-21 DIAGNOSIS — Z833 Family history of diabetes mellitus: Secondary | ICD-10-CM

## 2019-04-21 DIAGNOSIS — L539 Erythematous condition, unspecified: Secondary | ICD-10-CM | POA: Diagnosis not present

## 2019-04-21 DIAGNOSIS — IMO0002 Reserved for concepts with insufficient information to code with codable children: Secondary | ICD-10-CM

## 2019-04-21 DIAGNOSIS — E785 Hyperlipidemia, unspecified: Secondary | ICD-10-CM | POA: Diagnosis present

## 2019-04-21 DIAGNOSIS — A419 Sepsis, unspecified organism: Secondary | ICD-10-CM

## 2019-04-21 DIAGNOSIS — Z888 Allergy status to other drugs, medicaments and biological substances status: Secondary | ICD-10-CM

## 2019-04-21 DIAGNOSIS — Z818 Family history of other mental and behavioral disorders: Secondary | ICD-10-CM

## 2019-04-21 LAB — CBC WITH DIFFERENTIAL/PLATELET
Abs Immature Granulocytes: 0.03 10*3/uL (ref 0.00–0.07)
Basophils Absolute: 0 10*3/uL (ref 0.0–0.1)
Basophils Relative: 1 %
Eosinophils Absolute: 0 10*3/uL (ref 0.0–0.5)
Eosinophils Relative: 0 %
HCT: 46 % (ref 36.0–46.0)
Hemoglobin: 15.9 g/dL — ABNORMAL HIGH (ref 12.0–15.0)
Immature Granulocytes: 0 %
Lymphocytes Relative: 17 %
Lymphs Abs: 1.3 10*3/uL (ref 0.7–4.0)
MCH: 32.3 pg (ref 26.0–34.0)
MCHC: 34.6 g/dL (ref 30.0–36.0)
MCV: 93.3 fL (ref 80.0–100.0)
Monocytes Absolute: 0.6 10*3/uL (ref 0.1–1.0)
Monocytes Relative: 7 %
Neutro Abs: 5.9 10*3/uL (ref 1.7–7.7)
Neutrophils Relative %: 75 %
Platelets: 158 10*3/uL (ref 150–400)
RBC: 4.93 MIL/uL (ref 3.87–5.11)
RDW: 13.6 % (ref 11.5–15.5)
WBC: 7.9 10*3/uL (ref 4.0–10.5)
nRBC: 0 % (ref 0.0–0.2)

## 2019-04-21 LAB — GLUCOSE, CAPILLARY
Glucose-Capillary: 194 mg/dL — ABNORMAL HIGH (ref 70–99)
Glucose-Capillary: 188 mg/dL — ABNORMAL HIGH (ref 70–99)

## 2019-04-21 LAB — COMPREHENSIVE METABOLIC PANEL
ALT: 32 U/L (ref 0–44)
AST: 38 U/L (ref 15–41)
Albumin: 3.7 g/dL (ref 3.5–5.0)
Alkaline Phosphatase: 55 U/L (ref 38–126)
Anion gap: 12 (ref 5–15)
BUN: 17 mg/dL (ref 8–23)
CO2: 26 mmol/L (ref 22–32)
Calcium: 8.9 mg/dL (ref 8.9–10.3)
Chloride: 97 mmol/L — ABNORMAL LOW (ref 98–111)
Creatinine, Ser: 0.68 mg/dL (ref 0.44–1.00)
GFR calc Af Amer: >60 mL/min (ref >60)
GFR calc non Af Amer: >60 mL/min (ref >60)
Glucose, Bld: 300 mg/dL — ABNORMAL HIGH (ref 70–99)
Potassium: 3.6 mmol/L (ref 3.5–5.1)
Sodium: 135 mmol/L (ref 135–145)
Total Bilirubin: 1.5 mg/dL — ABNORMAL HIGH (ref 0.3–1.2)
Total Protein: 8.3 g/dL — ABNORMAL HIGH (ref 6.5–8.1)

## 2019-04-21 LAB — LACTIC ACID, PLASMA
Lactic Acid, Venous: 3.3 mmol/L (ref 0.5–1.9)
Lactic Acid, Venous: 3 mmol/L (ref 0.5–1.9)

## 2019-04-21 MED ORDER — INSULIN ASPART 100 UNIT/ML ~~LOC~~ SOLN
0.0000 [IU] | Freq: Three times a day (TID) | SUBCUTANEOUS | Status: DC
  Administered 2019-04-21 – 2019-04-22 (×2): 5 [IU] via SUBCUTANEOUS
  Administered 2019-04-22: 8 [IU] via SUBCUTANEOUS
  Administered 2019-04-22 – 2019-04-23 (×3): 5 [IU] via SUBCUTANEOUS
  Administered 2019-04-23: 3 [IU] via SUBCUTANEOUS
  Administered 2019-04-24: 2 [IU] via SUBCUTANEOUS
  Administered 2019-04-24 (×2): 5 [IU] via SUBCUTANEOUS
  Administered 2019-04-25 (×2): 3 [IU] via SUBCUTANEOUS
  Filled 2019-04-21 (×12): qty 1

## 2019-04-21 MED ORDER — SODIUM CHLORIDE 0.9 % IV BOLUS (SEPSIS)
500.0000 mL | Freq: Once | INTRAVENOUS | Status: AC
  Administered 2019-04-21: 500 mL via INTRAVENOUS

## 2019-04-21 MED ORDER — ENOXAPARIN SODIUM 40 MG/0.4ML ~~LOC~~ SOLN
40.0000 mg | Freq: Two times a day (BID) | SUBCUTANEOUS | Status: DC
  Administered 2019-04-21 – 2019-04-25 (×8): 40 mg via SUBCUTANEOUS
  Filled 2019-04-21 (×8): qty 0.4

## 2019-04-21 MED ORDER — SODIUM CHLORIDE 0.9% FLUSH
3.0000 mL | Freq: Two times a day (BID) | INTRAVENOUS | Status: DC
  Administered 2019-04-21 – 2019-04-25 (×4): 3 mL via INTRAVENOUS

## 2019-04-21 MED ORDER — ONDANSETRON HCL 4 MG/2ML IJ SOLN: 4.0000 mg | Freq: Four times a day (QID) | INTRAMUSCULAR | Status: DC | PRN

## 2019-04-21 MED ORDER — VANCOMYCIN HCL IN DEXTROSE 1-5 GM/200ML-% IV SOLN
1000.0000 mg | Freq: Once | INTRAVENOUS | Status: AC
  Administered 2019-04-21: 1000 mg via INTRAVENOUS
  Filled 2019-04-21: qty 200

## 2019-04-21 MED ORDER — VANCOMYCIN HCL 1500 MG/300ML IV SOLN
1500.0000 mg | Freq: Once | INTRAVENOUS | Status: AC
  Administered 2019-04-21: 1500 mg via INTRAVENOUS
  Filled 2019-04-21 (×2): qty 300

## 2019-04-21 MED ORDER — INSULIN ASPART 100 UNIT/ML ~~LOC~~ SOLN: 0.0000 [IU] | Freq: Every day | SUBCUTANEOUS | Status: DC

## 2019-04-21 MED ORDER — SODIUM CHLORIDE 0.9 % IV SOLN
2.0000 g | Freq: Once | INTRAVENOUS | Status: AC
  Administered 2019-04-21: 2 g via INTRAVENOUS
  Filled 2019-04-21: qty 2

## 2019-04-21 MED ORDER — METRONIDAZOLE IN NACL 5-0.79 MG/ML-% IV SOLN
500.0000 mg | Freq: Once | INTRAVENOUS | Status: AC
  Administered 2019-04-21: 500 mg via INTRAVENOUS
  Filled 2019-04-21: qty 100

## 2019-04-21 MED ORDER — ONDANSETRON HCL 4 MG PO TABS: 4.0000 mg | ORAL_TABLET | Freq: Four times a day (QID) | ORAL | Status: DC | PRN

## 2019-04-21 MED ORDER — VANCOMYCIN HCL IN DEXTROSE 1-5 GM/200ML-% IV SOLN
1000.0000 mg | Freq: Two times a day (BID) | INTRAVENOUS | Status: DC
  Filled 2019-04-21: qty 200

## 2019-04-21 MED ORDER — SODIUM CHLORIDE 0.9 % IV SOLN
2.0000 g | Freq: Three times a day (TID) | INTRAVENOUS | Status: DC
  Administered 2019-04-21 – 2019-04-25 (×11): 2 g via INTRAVENOUS
  Filled 2019-04-21 (×13): qty 2

## 2019-04-21 MED ORDER — TRAMADOL HCL 50 MG PO TABS
50.0000 mg | ORAL_TABLET | Freq: Four times a day (QID) | ORAL | Status: DC | PRN
  Administered 2019-04-22: 50 mg via ORAL
  Filled 2019-04-21: qty 1

## 2019-04-21 MED ORDER — BISACODYL 5 MG PO TBEC: 5.0000 mg | DELAYED_RELEASE_TABLET | Freq: Every day | ORAL | Status: DC | PRN

## 2019-04-21 MED ORDER — SODIUM CHLORIDE 0.9% FLUSH: 3.0000 mL | Freq: Once | INTRAVENOUS | Status: DC

## 2019-04-21 MED ORDER — ACETAMINOPHEN 325 MG PO TABS
650.0000 mg | ORAL_TABLET | Freq: Four times a day (QID) | ORAL | Status: DC | PRN
  Administered 2019-04-21: 650 mg via ORAL
  Filled 2019-04-21: qty 2

## 2019-04-21 MED ORDER — ACETAMINOPHEN 650 MG RE SUPP: 650.0000 mg | Freq: Four times a day (QID) | RECTAL | Status: DC | PRN

## 2019-04-21 NOTE — ED Notes (Signed)
Rainbow, gray on ice, and one set of [facility] was sent to lab.

## 2019-04-21 NOTE — Telephone Encounter (Signed)
Spoke with Kathlee Nations at [facility] ED. She was informed of patients history of sepsis, pyelonephritis, Orthastatic, 2-3 days sick with fever, diabetic, UTI symptoms and cellulitis. Per Dr. Roxan Hockey request.

## 2019-04-21 NOTE — Progress Notes (Signed)
PHARMACIST - PHYSICIAN COMMUNICATION  CONCERNING:  Enoxaparin (Lovenox) for DVT Prophylaxis    RECOMMENDATION: Patient was prescribed enoxaprin 40mg  q24 hours for VTE prophylaxis.   Filed Weights   04/21/19 1219  Weight: 279 lb 3.2 oz (126.6 kg)    Body mass index is 42.45 kg/m.  Estimated Creatinine Clearance: 99.8 mL/min (by C-G formula based on SCr of 0.68 mg/dL).   Based on [place] patient is candidate for enoxaparin 40mg  every 12 hour dosing due to BMI being >40.   DESCRIPTION: Pharmacy has adjusted enoxaparin dose per [facility] policy.  Patient is now receiving enoxaparin 40mg  every 12 hours.  Rowland Lathe, PharmD Clinical Pharmacist  04/21/2019 3:33 PM

## 2019-04-21 NOTE — ED Triage Notes (Signed)
Pt comes with c/o left leg infection. Pt states her doctor sent her over for possible staph infection.  Pt states no weeping or discharge noted. Pt states swelling and warmth to touch.

## 2019-04-21 NOTE — Progress Notes (Signed)
Patient ID: Tina Short, female    DOB: Feb 07, 1956, 64 y.o.   MRN: [id number]  PCP: Steele Sizer, MD  Chief Complaint  Patient presents with  . Cellulitis    Left leg, onset 04/18/19 after dinner    Subjective:   Tina Short is a 64 y.o. female, presents to clinic with CC of the following:  Chief Complaint  Patient presents with  . Cellulitis    Left leg, onset 04/18/19 after dinner    HPI:  Patient is a 64 year old female patient of Dr. Ancil Boozer who was last seen on February 06/2019. Has a history of hypertension, diabetes -not well controlled in the recent past with her last A1C 8.5, hyperlipidemia, morbid obesity, depression, gout, sleep apnea, and GERD Follows up today with concerns for a lower extremity cellulitis.  Patient with a history of sepsis noted, and she notes Monday evening, she felt ill, with flulike symptoms.  She was very achy, had chills, did not check her temperature, and went to bed.  The next couple days the symptoms continued, and she basically went from bed to recliner and back.  She felt out of it, did not take her medicines, and she stated she just forgot.  She noted that has not happened in her past.  She yesterday noted a rash on her left lower extremity, but presently progressed to the mid calf region.  She noted getting up today, she feels dizzy when she stands up, has tried to stay hydrated to the best she could.  She notes some increased frequency, some burning when she pees, and some suprapubic pain, and has a history of pyelonephritis in her past as well.  She denies any focal flank pains, although has had some back pains as well. She states being up and about today she thinks she may feel a little better this morning than in the last couple days, and really does not want to go to an emergency room setting as we were discussing that option, and how I thought that was the best next steps. She is a patient of Dr. Ancil Boozer, and she asked if she could get  her opinion as she saw her on the way into the office this morning.  Dr. Ancil Boozer did come and see the patient, and agrees with the plan to proceed to the emergency room for further management.  He was very helpful as the patient was quite upset with the concerns for cost of the emergency room visit as she noted she just retired a week ago, although she does note she still has insurance, just that it is a high deductible and not that great in her opinion.  Patient Active Problem List   Diagnosis Date Noted  . Esophageal dysphagia   . Sepsis ([place]) 08/10/2016  . Colon cancer screening 04/30/2016  . Major depression, recurrent, chronic ([place]) 10/30/2015  . Dyslipidemia associated with type 2 diabetes mellitus ([place]) 10/30/2015  . Vitamin D deficiency 07/27/2015  . Vitamin B12 deficiency 07/27/2015  . History of anemia 09/12/2014  . Carpal tunnel syndrome 09/12/2014  . Obstructive sleep apnea 09/12/2014  . Edema of extremities 09/12/2014  . Gout 09/12/2014  . Morbid obesity, unspecified obesity type ([place]) 11/15/2009  . Benign essential HTN 10/07/2006  . Type 2 diabetes mellitus, uncontrolled, with neuropathy ([place]) 10/07/2006  . Hyperlipidemia 10/07/2006      Current Outpatient Medications:  .  allopurinol (ZYLOPRIM) 100 MG tablet, Take 1 tablet (100 mg total) by mouth 2 (  two) times daily., Disp: 180 tablet, Rfl: 1 .  atorvastatin (LIPITOR) 40 MG tablet, Take 1 tablet (40 mg total) by mouth at bedtime., Disp: 90 tablet, Rfl: 1 .  blood glucose meter kit and supplies, Dispense based on patient and insurance preference. Use up to four times daily as directed. (FOR ICD-10 E10.9, E11.9)., Disp: 1 each, Rfl: 0 .  clotrimazole (LOTRIMIN) 1 % cream, Apply 1 application topically 2 (two) times daily., Disp: 30 g, Rfl: 0 .  fenofibrate (TRICOR) 145 MG tablet, Take 1 tablet (145 mg total) by mouth daily., Disp: 90 tablet, Rfl: 1 .  furosemide (LASIX) 40 MG tablet, Daily (Patient taking differently: Take  40 mg by mouth as needed for edema. Daily), Disp: 90 tablet, Rfl: 1 .  hydrocortisone cream 0.5 %, Apply 1 application topically 2 (two) times daily., Disp: 30 g, Rfl: 0 .  losartan (COZAAR) 100 MG tablet, Take 1 tablet (100 mg total) by mouth daily., Disp: 90 tablet, Rfl: 1 .  ONETOUCH ULTRA test strip, TEST UP TO FOUR TIMES A DAY AS DIRECTED, Disp: 300 strip, Rfl: 0 .  potassium chloride SA (K-DUR) 20 MEQ tablet, TAKE 1 TABLET(20 MEQ) BY MOUTH DAILY (Patient taking differently: Take 20 mEq by mouth as needed. TAKE 1 TABLET(20 MEQ) BY MOUTH DAILY), Disp: 90 tablet, Rfl: 0 .  Semaglutide,0.25 or 0.5MG/DOS, (OZEMPIC, 0.25 OR 0.5 MG/DOSE,) 2 MG/1.5ML SOPN, Inject 0.5 mg into the skin once a week., Disp: 2 pen, Rfl: 0 .  venlafaxine XR (EFFEXOR-XR) 150 MG 24 hr capsule, Take 1 capsule (150 mg total) by mouth daily with breakfast., Disp: 180 capsule, Rfl: 0 .  Vitamin D, Ergocalciferol, (DRISDOL) 1.25 MG (50000 UNIT) CAPS capsule, TAKE ONE CAPSULE BY MOUTH EVERY 7 DAYS, Disp: 12 capsule, Rfl: 1   Allergies  Allergen Reactions  . Abilify [Aripiprazole] Shortness Of Breath    Other reaction(s): Difficulty breathing  . Invokana [Canagliflozin] Itching    Yeast infections  . Metformin And Related     Diarrhea   . Lovaza [Omega-3-Acid Ethyl Esters] Other (See Comments)    Burping      Past Surgical History:  Procedure Laterality Date  . ABDOMINAL HYSTERECTOMY    . COLONOSCOPY    . COLONOSCOPY WITH PROPOFOL N/A 05/14/2016   Procedure: COLONOSCOPY WITH PROPOFOL;  Surgeon: [name], MD;  Location: [facility] ENDOSCOPY;  Service: Endoscopy;  Laterality: N/A;  . COLONOSCOPY WITH PROPOFOL N/A 04/15/2018   Procedure: COLONOSCOPY WITH PROPOFOL;  Surgeon: Lin Landsman, MD;  Location: [facility] ENDOSCOPY;  Service: Gastroenterology;  Laterality: N/A;  . ESOPHAGOGASTRODUODENOSCOPY (EGD) WITH PROPOFOL N/A 04/15/2018   Procedure: ESOPHAGOGASTRODUODENOSCOPY (EGD) WITH PROPOFOL;  Surgeon: Lin Landsman, MD;  Location: [facility] ENDOSCOPY;  Service: Gastroenterology;  Laterality: N/A;  . FOOT SURGERY Right    X2  . SHOULDER SURGERY Right   . WRIST SURGERY Bilateral      Family History  Problem Relation Age of Onset  . Diabetes Mother   . Cancer Mother        uterine  . Mental illness Father   . Obesity Daughter   . Diabetes Maternal Grandmother   . Heart disease Brother   . Obesity Daughter      Social History   Tobacco Use  . Smoking status: Never Smoker  . Smokeless tobacco: Never Used  Substance Use Topics  . Alcohol use: No    Alcohol/week: 0.0 standard drinks    With staff assistance, above reviewed with the patient  today.  ROS: As per HPI, otherwise no specific complaints on a limited and focused system review   No results found for this or any previous visit (from the past 72 hour(s)).   PHQ2/9: Depression screen [facility] 2/9 04/21/2019 03/18/2019 11/03/2018 10/14/2018 08/27/2018  Decreased Interest 0 0 1 1 0  Down, Depressed, Hopeless 0 _0 PHQ - 2 Score 0 _1 Altered sleeping 0 1 0 1 0  Tired, decreased energy 0 _2 0  Change in appetite 0 0 0 1 0  Feeling bad or failure about yourself  0 0 1 1 0  Trouble concentrating 0 0 0 0 0  Moving slowly or fidgety/restless 0 0 0 0 0  Suicidal thoughts 0 0 0 0 0  PHQ-9 Score 0 _3 Difficult doing work/chores Not difficult at all Somewhat difficult Somewhat difficult Somewhat difficult Not difficult at all  Some recent data might be hidden   PHQ-2/9 Result is neg  Fall Risk: Fall Risk  04/21/2019 03/18/2019 11/03/2018 10/14/2018 08/27/2018  Falls in the past year? 0 0 0 0 0  Number falls in past yr: 0 0 0 0 0  Injury with Fall? 0 0 0 0 0  Risk for fall due to : - - - - -      Objective:   Vitals:   04/21/19 1110  BP: 108/70  Pulse: (!) 103  Resp: 16  Temp: 97.6 F (36.4 C)  TempSrc: Temporal  SpO2: 96%  Weight: 279 lb 3.2 oz (126.6 kg)  Height: _4  (1.727 m)    Body mass index is 42.45  kg/m.  Physical Exam   NAD, masked, obese, fatigued appearing, sitting in a wheelchair HEENT - [place]/AT, sclera anicteric, Neck - supple, Car -mildly tachycardic, regular without m/g/r Pulm- RR and effort normal at rest,  Abd - soft, obese, NT Back - no focal CVA tenderness Ext - + LE edema bilat, an erythematous and at places having a small confluence of a macular process with areas of a macular papular rash on the dorsum of the left foot extending up to the mid shin region.  This area was warm to the touch.  There were no blisters, no open ulcerative regions, no lymphangitic streaks.  The plantar aspect of the left foot had some small areas of peeling and cracking, with no ulcer identified.  No open areas between the toes. She had good motion of the ankle without significant pain, could wiggle the toes. No focal calf tenderness with no cords identified. Neuro/psychiatric - affect was not flat, appropriate with conversation  Alert and oriented  Grossly non-focal - good strength on testing extremities, sensation intact to LT in distal extremities  Speech and gait are normal   Results for orders placed or performed in visit on 03/18/19  POCT HgB A1C  Result Value Ref Range   Hemoglobin A1C 8.1 (A) 4.0 - 5.6 %   HbA1c POC (<> result, manual entry)     HbA1c, POC (prediabetic range)     HbA1c, POC (controlled diabetic range)         Assessment & Plan:   1. Cellulitis of left lower extremity Very concerned with patient's history of sepsis, more recent uncontrolled diabetes, orthostatic symptoms, fevers and chills and inability to get out of bed as described for the past 2 to 3 days, some more recent urinary tract infectious symptoms and a history of pyelonephritis with  the need to be seen emergently with IV fluids given, IV antibiotics likely needed, and some more immediate testing of laboratory studies to help in the management. Dr. Ancil Boozer was asked to give an opinion, and she agreed and  was helpful in having the patient understand the importance of these next steps in her management. Both Dr. Ancil Boozer and myself also communicated with the husband who came into the office these concerns, and he can take her to the hospital which is adjacent to our practice for further management.  2. Type 2 diabetes mellitus, uncontrolled, with neuropathy ([place]) As above  3. Morbid obesity, unspecified obesity type ([place])   4. Benign essential HTN Blood pressure was okay on check today  5. Orthostatic dizziness As above  6. History of sepsis As above  7. Chills As above    Patient referred to the emergency room for further evaluation and management.     Towanda Malkin, MD 04/21/19 11:19 AM

## 2019-04-21 NOTE — Consult Note (Signed)
Pharmacy Antibiotic Note  Tina Short is a 64 y.o. female admitted on 04/21/2019 with cellulitis.  Pharmacy has been consulted for Vancomycin dosing. Vancomycin 1g x1 dose ordered in the ED.   Plan: 1. Will order Vancomycin 1.5 g x1 dose for a total loading dose of 2500 mg. Maintenance: Vancomycin 1000 mg Q12H. AUC 400-550. Expected AUC 494.6. Expected Cssmin 14.6. Scr used 0.8  Mg/dL; Vd 0.5 used.  Height: 5\' 8"  (172.7 cm) Weight: 279 lb 3.2 oz (126.6 kg) IBW/kg (Calculated) : 63.9  Temp (24hrs), Avg:98.4 F (36.9 C), Min:97.6 F (36.4 C), Max:99.8 F (37.7 C)  Recent Labs  Lab 04/21/19 1219 04/21/19 1421  WBC 7.9  --   CREATININE 0.68  --   LATICACIDVEN 3.3* 3.0*    Estimated Creatinine Clearance: 99.8 mL/min (by C-G formula based on SCr of 0.68 mg/dL).    Allergies  Allergen Reactions  . Abilify [Aripiprazole] Shortness Of Breath    Other reaction(s): Difficulty breathing  . Invokana [Canagliflozin] Itching    Yeast infections  . Metformin And Related     Diarrhea   . Lovaza [Omega-3-Acid Ethyl Esters] Other (See Comments)    Burping     Antimicrobials this admission:   Dose adjustments this admission:   Microbiology results:  Thank you for allowing pharmacy to be a part of this patient's care.  Rowland Lathe 04/21/2019 5:24 PM

## 2019-04-21 NOTE — ED Notes (Signed)
ED TO INPATIENT HANDOFF REPORT  ED Nurse Name and Phone #: [phone]  S Name/Age/Gender Tina Short Maris 64 y.o. female Room/Bed: ED11HA/ED11HA  Code Status   Code Status: Full Code  Home/SNF/Other Home Patient oriented to: self, place, time and situation Is this baseline? Yes   Triage Complete: Triage complete  Chief Complaint Cellulitis [id number]  Triage Note Pt comes with c/o left leg infection. Pt states her doctor sent her over for possible staph infection.  Pt states no weeping or discharge noted. Pt states swelling and warmth to touch.    Allergies Allergies  Allergen Reactions  . Abilify [Aripiprazole] Shortness Of Breath    Other reaction(s): Difficulty breathing  . Invokana [Canagliflozin] Itching    Yeast infections  . Metformin And Related     Diarrhea   . Lovaza [Omega-3-Acid Ethyl Esters] Other (See Comments)    Burping     Level of Care/Admitting Diagnosis ED Disposition    ED Disposition Condition [place]: [place] [[id number]]  Level of Care: Med-Surg [16]  Covid Evaluation: Asymptomatic Screening Protocol (No Symptoms)  Diagnosis: Cellulitis [id number]  Admitting Physician: Wyvonnia Dusky [id number]  Attending Physician: Wyvonnia Dusky [id number]  Estimated length of stay: past midnight tomorrow  Certification:: I certify this patient will need inpatient services for at least 2 midnights       B Medical/Surgery History Past Medical History:  Diagnosis Date  . Depression   . Diabetes ([place])   . Hyperlipidemia   . Hypertension   . Neuropathy due to type 2 diabetes mellitus ([place])   . Obesity, morbid ([place]) 11/15/2009  . Sleep apnea    cpap  . Swelling    Past Surgical History:  Procedure Laterality Date  . ABDOMINAL HYSTERECTOMY    . COLONOSCOPY    . COLONOSCOPY WITH PROPOFOL N/A 05/14/2016   Procedure: COLONOSCOPY WITH PROPOFOL;  Surgeon: [name], MD;  Location: [facility]  ENDOSCOPY;  Service: Endoscopy;  Laterality: N/A;  . COLONOSCOPY WITH PROPOFOL N/A 04/15/2018   Procedure: COLONOSCOPY WITH PROPOFOL;  Surgeon: Lin Landsman, MD;  Location: [facility] ENDOSCOPY;  Service: Gastroenterology;  Laterality: N/A;  . ESOPHAGOGASTRODUODENOSCOPY (EGD) WITH PROPOFOL N/A 04/15/2018   Procedure: ESOPHAGOGASTRODUODENOSCOPY (EGD) WITH PROPOFOL;  Surgeon: Lin Landsman, MD;  Location: [facility] ENDOSCOPY;  Service: Gastroenterology;  Laterality: N/A;  . FOOT SURGERY Right    X2  . SHOULDER SURGERY Right   . WRIST SURGERY Bilateral      A IV Location/Drains/Wounds Patient Lines/Drains/Airways Status   Active Line/Drains/Airways    Name:   Placement date:   Placement time:   Site:   Days:   Peripheral IV 04/21/19 Left Antecubital   04/21/19    1453    Antecubital   less than 1          Intake/Output Last 24 hours No intake or output data in the 24 hours ending 04/21/19 1603  Labs/Imaging Results for orders placed or performed during the hospital encounter of 04/21/19 (from the past 48 hour(s))  Lactic acid, plasma     Status: Abnormal   Collection Time: 04/21/19 12:19 PM  Result Value Ref Range   Lactic Acid, Venous 3.3 (HH) 0.5 - 1.9 mmol/L    Comment: CRITICAL RESULT CALLED TO, READ BACK BY AND VERIFIED WITH Kris Mouton 04/21/19 1314 KLW Performed at [facility], [address], [place], [place] [address]   Comprehensive metabolic panel     Status:  Abnormal   Collection Time: 04/21/19 12:19 PM  Result Value Ref Range   Sodium 135 135 - 145 mmol/L   Potassium 3.6 3.5 - 5.1 mmol/L   Chloride 97 (L) 98 - 111 mmol/L   CO2 26 22 - 32 mmol/L   Glucose, Bld 300 (H) 70 - 99 mg/dL    Comment: Glucose reference range applies only to samples taken after fasting for at least 8 hours.   BUN 17 8 - 23 mg/dL   Creatinine, Ser 0.68 0.44 - 1.00 mg/dL   Calcium 8.9 8.9 - 10.3 mg/dL   Total Protein 8.3 (H) 6.5 - 8.1 g/dL   Albumin 3.7 3.5 - 5.0 g/dL   AST 38 15  - 41 U/L   ALT 32 0 - 44 U/L   Alkaline Phosphatase 55 38 - 126 U/L   Total Bilirubin 1.5 (H) 0.3 - 1.2 mg/dL   GFR calc non Af Amer >60 >60 mL/min   GFR calc Af Amer >60 >60 mL/min   Anion gap 12 5 - 15    Comment: Performed at [facility], [address] [place], [place] [address]  CBC with Differential     Status: Abnormal   Collection Time: 04/21/19 12:19 PM  Result Value Ref Range   WBC 7.9 4.0 - 10.5 K/uL   RBC 4.93 3.87 - 5.11 MIL/uL   Hemoglobin 15.9 (H) 12.0 - 15.0 g/dL   HCT 46.0 36.0 - 46.0 %   MCV 93.3 80.0 - 100.0 fL   MCH 32.3 26.0 - 34.0 pg   MCHC 34.6 30.0 - 36.0 g/dL   RDW 13.6 11.5 - 15.5 %   Platelets 158 150 - 400 K/uL   nRBC 0.0 0.0 - 0.2 %   Neutrophils Relative % 75 %   Neutro Abs 5.9 1.7 - 7.7 K/uL   Lymphocytes Relative 17 %   Lymphs Abs 1.3 0.7 - 4.0 K/uL   Monocytes Relative 7 %   Monocytes Absolute 0.6 0.1 - 1.0 K/uL   Eosinophils Relative 0 %   Eosinophils Absolute 0.0 0.0 - 0.5 K/uL   Basophils Relative 1 %   Basophils Absolute 0.0 0.0 - 0.1 K/uL   Immature Granulocytes 0 %   Abs Immature Granulocytes 0.03 0.00 - 0.07 K/uL    Comment: Performed at [facility], [address] [place], [place] [address]  Lactic acid, plasma     Status: Abnormal   Collection Time: 04/21/19  2:21 PM  Result Value Ref Range   Lactic Acid, Venous 3.0 (HH) 0.5 - 1.9 mmol/L    Comment: CRITICAL VALUE NOTED. VALUE IS CONSISTENT WITH PREVIOUSLY REPORTED/CALLED VALUE KLW Performed at [facility], [address] [place], [place] [address]    No results found.  Pending Labs Unresulted Labs (From admission, onward)    Start     Ordered   04/22/19 0500  CBC  Daily,   STAT     04/21/19 1554   04/22/19 0500  Comprehensive metabolic panel  Daily,   STAT     04/21/19 1554   04/21/19 1558  Urine Culture  Once,   STAT     04/21/19 1557   04/21/19 1512  SARS CORONAVIRUS 2 (TAT 6-24 HRS) Nasopharyngeal Nasopharyngeal Swab  (Tier 3 (TAT  6-24 hrs))  Once,   STAT    Question Answer Comment  Is this test for diagnosis or screening Screening   Symptomatic for COVID-19 as defined by CDC No   Hospitalized  for COVID-19 No   Admitted to ICU for COVID-19 No   Previously tested for COVID-19 Yes   Resident in a congregate (group) care setting No   Employed in healthcare setting No   Pregnant No      04/21/19 1511   04/21/19 1439  Blood Culture (routine x 2)  BLOOD CULTURE X 2,   STAT     04/21/19 1439   04/21/19 1221  Urinalysis, Complete w Microscopic  ONCE - STAT,   STAT     04/21/19 1220          Vitals/Pain Today's Vitals   04/21/19 1219 04/21/19 1221  BP:  (!) 108/57  Pulse:  94  Resp:  17  Temp:  97.9 F (36.6 C)  SpO2:  95%  Weight: 126.6 kg   Height: 5\' 8"  (1.727 m)   PainSc: 6      Isolation Precautions No active isolations  Medications Medications  sodium chloride flush (NS) 0.9 % injection 3 mL ( Intravenous Canceled Entry 04/21/19 1503)  metroNIDAZOLE (FLAGYL) IVPB 500 mg (has no administration in time range)  vancomycin (VANCOCIN) IVPB 1000 mg/200 mL premix (has no administration in time range)  enoxaparin (LOVENOX) injection 40 mg (has no administration in time range)  sodium chloride flush (NS) 0.9 % injection 3 mL (has no administration in time range)  acetaminophen (TYLENOL) tablet 650 mg (has no administration in time range)    Or  acetaminophen (TYLENOL) suppository 650 mg (has no administration in time range)  traMADol (ULTRAM) tablet 50 mg (has no administration in time range)  bisacodyl (DULCOLAX) EC tablet 5 mg (has no administration in time range)  ondansetron (ZOFRAN) tablet 4 mg (has no administration in time range)    Or  ondansetron (ZOFRAN) injection 4 mg (has no administration in time range)  vancomycin (VANCOREADY) IVPB 1500 mg/300 mL (has no administration in time range)  insulin aspart (novoLOG) injection 0-15 Units (has no administration in time range)  insulin aspart  (novoLOG) injection 0-5 Units (has no administration in time range)  ceFEPIme (MAXIPIME) 2 g in sodium chloride 0.9 % 100 mL IVPB (has no administration in time range)  sodium chloride 0.9 % bolus 500 mL (500 mLs Intravenous New Bag/Given 04/21/19 1523)  ceFEPIme (MAXIPIME) 2 g in sodium chloride 0.9 % 100 mL IVPB (2 g Intravenous New Bag/Given 04/21/19 1524)    Mobility walks Low fall risk   Focused Assessments    R Recommendations: See Admitting Provider Note  Report given to:

## 2019-04-21 NOTE — H&P (Signed)
History and Physical    SANDEEP DELAGARZA SUP:[id number] DOB: [birth year] DOA: 04/21/2019  PCP: Steele Sizer, MD   Patient coming from: home    Chief Complaint: LLE pain & swelling  HPI: 64 y/o F w/ PMH of morbid obesity, HLD, depression, OSA on CPAP intermittently who presents who LLE pain x swelling x 4 days. The pain is sharp, intermittent, w/o radiation. Tylenol makes the pain better and nothing makes the pain worse. The severity is currently 0/10. Pt also c/o subjective fevers and chills. Pt denies any dizziness/lightheadedness, sweating, chest pain, shortness of breath, nausea, vomiting, abd pain, diarrhea or constipation. Of note, pt c/o dysuria, urinary frequency & urgency x 3 days. Pt has hx of UTIs.    Review of Systems: As per HPI otherwise 10 point review of systems negative.    Past Medical History:  Diagnosis Date  . Depression   . Diabetes ([place])   . Hyperlipidemia   . Hypertension   . Neuropathy due to type 2 diabetes mellitus ([place])   . Obesity, morbid ([place]) 11/15/2009  . Sleep apnea    cpap  . Swelling     Past Surgical History:  Procedure Laterality Date  . ABDOMINAL HYSTERECTOMY    . COLONOSCOPY    . COLONOSCOPY WITH PROPOFOL N/A 05/14/2016   Procedure: COLONOSCOPY WITH PROPOFOL;  Surgeon: [name], MD;  Location: [facility] ENDOSCOPY;  Service: Endoscopy;  Laterality: N/A;  . COLONOSCOPY WITH PROPOFOL N/A 04/15/2018   Procedure: COLONOSCOPY WITH PROPOFOL;  Surgeon: Lin Landsman, MD;  Location: [facility] ENDOSCOPY;  Service: Gastroenterology;  Laterality: N/A;  . ESOPHAGOGASTRODUODENOSCOPY (EGD) WITH PROPOFOL N/A 04/15/2018   Procedure: ESOPHAGOGASTRODUODENOSCOPY (EGD) WITH PROPOFOL;  Surgeon: Lin Landsman, MD;  Location: [facility] ENDOSCOPY;  Service: Gastroenterology;  Laterality: N/A;  . FOOT SURGERY Right    X2  . SHOULDER SURGERY Right   . WRIST SURGERY Bilateral      reports that she has never smoked. She has never used smokeless tobacco. She  reports that she does not drink alcohol or use drugs.  Allergies  Allergen Reactions  . Abilify [Aripiprazole] Shortness Of Breath    Other reaction(s): Difficulty breathing  . Invokana [Canagliflozin] Itching    Yeast infections  . Metformin And Related     Diarrhea   . Lovaza [Omega-3-Acid Ethyl Esters] Other (See Comments)    Burping     Family History  Problem Relation Age of Onset  . Diabetes Mother   . Cancer Mother        uterine  . Mental illness Father   . Obesity Daughter   . Diabetes Maternal Grandmother   . Heart disease Brother   . Obesity Daughter      Prior to Admission medications   Medication Sig Start Date End Date Taking? Authorizing Provider  allopurinol (ZYLOPRIM) 100 MG tablet Take 1 tablet (100 mg total) by mouth 2 (two) times daily. 03/18/19   Steele Sizer, MD  atorvastatin (LIPITOR) 40 MG tablet Take 1 tablet (40 mg total) by mouth at bedtime. 03/18/19   Steele Sizer, MD  blood glucose meter kit and supplies Dispense based on patient and insurance preference. Use up to four times daily as directed. (FOR ICD-10 E10.9, E11.9). 11/03/18   Steele Sizer, MD  clotrimazole (LOTRIMIN) 1 % cream Apply 1 application topically 2 (two) times daily. 03/18/19   Steele Sizer, MD  fenofibrate (TRICOR) 145 MG tablet Take 1 tablet (145 mg total) by mouth daily. 03/20/19   Sowles,  Drue Stager, MD  furosemide (LASIX) 40 MG tablet Daily Patient taking differently: Take 40 mg by mouth as needed for edema. Daily 08/27/18   Steele Sizer, MD  hydrocortisone cream 0.5 % Apply 1 application topically 2 (two) times daily. 03/18/19   Steele Sizer, MD  losartan (COZAAR) 100 MG tablet Take 1 tablet (100 mg total) by mouth daily. 03/18/19   Steele Sizer, MD  ONETOUCH ULTRA test strip TEST UP TO FOUR TIMES A DAY AS DIRECTED 11/08/18   Ancil Boozer, Drue Stager, MD  potassium chloride SA (K-DUR) 20 MEQ tablet TAKE 1 TABLET(20 MEQ) BY MOUTH DAILY Patient taking differently: Take 20 mEq by  mouth as needed. TAKE 1 TABLET(20 MEQ) BY MOUTH DAILY 08/27/18   Steele Sizer, MD  Semaglutide,0.25 or 0.5MG/DOS, (OZEMPIC, 0.25 OR 0.5 MG/DOSE,) 2 MG/1.5ML SOPN Inject 0.5 mg into the skin once a week. 03/18/19   Steele Sizer, MD  venlafaxine XR (EFFEXOR-XR) 150 MG 24 hr capsule Take 1 capsule (150 mg total) by mouth daily with breakfast. 03/18/19   Steele Sizer, MD  Vitamin D, Ergocalciferol, (DRISDOL) 1.25 MG (50000 UNIT) CAPS capsule TAKE ONE CAPSULE BY MOUTH EVERY 7 DAYS 02/28/19   Steele Sizer, MD    Physical Exam: Vitals:   04/21/19 1219 04/21/19 1221  BP:  (!) 108/57  Pulse:  94  Resp:  17  Temp:  97.9 F (36.6 C)  SpO2:  95%  Weight: 126.6 kg   Height: _0  (1.727 m)     Constitutional: NAD, calm, comfortable Vitals:   04/21/19 1219 04/21/19 1221  BP:  (!) 108/57  Pulse:  94  Resp:  17  Temp:  97.9 F (36.6 C)  SpO2:  95%  Weight: 126.6 kg   Height: _1  (1.727 m)    Eyes: PERRL, lids and conjunctivae normal ENMT: Mucous membranes are moist. Posterior pharynx clear of any exudate or lesions Neck: normal, supple,  Respiratory: diminished breath sounds b/l. No rales, wheezes. No accessory muscle use.  Cardiovascular: S1/S2+. no  rubs / gallops. 3+ pitting edema of LLE  Abdomen: soft, no tenderness, obese. Hypoactive bowel sounds positive.  Musculoskeletal:  Decreased ROM of b/l LE in all directions, no contractures. Normal muscle tone.  Skin: LLE erythema, warmth & tenderness to palpation of LLE Neurologic: CN 2-12 grossly intact. Moves all 4 extremities  Psychiatric: Normal judgment and insight. Alert and oriented x 3. Flat mood and affect     Labs on Admission: I have personally reviewed following labs and imaging studies  CBC: Recent Labs  Lab 04/21/19 1219  WBC 7.9  NEUTROABS 5.9  HGB 15.9*  HCT 46.0  MCV 93.3  PLT 016   Basic Metabolic Panel: Recent Labs  Lab 04/21/19 1219  NA 135  K 3.6  CL 97*  CO2 26  GLUCOSE 300*  BUN 17    CREATININE 0.68  CALCIUM 8.9   GFR: Estimated Creatinine Clearance: 99.8 mL/min (by C-G formula based on SCr of 0.68 mg/dL). Liver Function Tests: Recent Labs  Lab 04/21/19 1219  AST 38  ALT 32  ALKPHOS 55  BILITOT 1.5*  PROT 8.3*  ALBUMIN 3.7   No results for input(s): LIPASE, AMYLASE in the last 168 hours. No results for input(s): AMMONIA in the last 168 hours. Coagulation Profile: No results for input(s): INR, PROTIME in the last 168 hours. Cardiac Enzymes: No results for input(s): CKTOTAL, CKMB, CKMBINDEX, TROPONINI in the last 168 hours. BNP (last 3 results) No results for input(s): PROBNP in the last  8760 hours. HbA1C: No results for input(s): HGBA1C in the last 72 hours. CBG: No results for input(s): GLUCAP in the last 168 hours. Lipid Profile: No results for input(s): CHOL, HDL, LDLCALC, TRIG, CHOLHDL, LDLDIRECT in the last 72 hours. Thyroid Function Tests: No results for input(s): TSH, T4TOTAL, FREET4, T3FREE, THYROIDAB in the last 72 hours. Anemia Panel: No results for input(s): VITAMINB12, FOLATE, FERRITIN, TIBC, IRON, RETICCTPCT in the last 72 hours. Urine analysis:    Component Value Date/Time   COLORURINE YELLOW (A) 12/24/2016 0931   APPEARANCEUR HAZY (A) 12/24/2016 0931   LABSPEC 1.021 12/24/2016 0931   PHURINE 5.0 12/24/2016 0931   GLUCOSEU NEGATIVE 12/24/2016 0931   HGBUR NEGATIVE 12/24/2016 0931   BILIRUBINUR NEGATIVE 02/08/2018 1549   KETONESUR NEGATIVE 12/24/2016 0931   PROTEINUR Positive (A) 02/08/2018 1549   PROTEINUR NEGATIVE 12/24/2016 0931   UROBILINOGEN 0.2 02/08/2018 1549   NITRITE POSITIVE 02/08/2018 1549   NITRITE NEGATIVE 12/24/2016 0931   LEUKOCYTESUR Moderate (2+) (A) 02/08/2018 1549    Radiological Exams on Admission: No results found.  EKG: Independently reviewed.   Assessment/Plan Active Problems:   * No active hospital problems. *  LLE cellulitis: will continue on IV vanco & cefepime today and will reassess in AM.  Blood cxs pending. Will consult wound care. [place] of LLE to r/o DVT  Morbid obesity: BMI 42.4. Would benefit greatly from weight loss  DM2: uncontrolled, HbA1c 8.1. Will hold home dose of ozempic. Will start SSI w/ accuchecks. Carb modified diet   Hyperbilirubinemia: etiology unclear. Will continue to monitor   Likely UTI: UA is positive, pt has GU symptoms & urine cx is pending. Continue on IV abxs  OSA: continue on CPAP at night  Depression: severity unknown. Will continue on home dose of venlafaxine   HLD: will continue on statin, fenofibrate   Gout: will continue on home dose of allopurinol    DVT prophylaxis: lovenox  Code Status: full  Family Communication:  Disposition Plan: likely home vs home health depends on therapy recs; will need IV abxs for a couple of days likely  Consults called:none Admission status: inpatient    Wyvonnia Dusky MD [facility] Pager [phone]   If 7PM-7AM, please contact night-coverage [email or web address]   04/21/2019, 3:13 PM

## 2019-04-21 NOTE — ED Provider Notes (Addendum)
[facility] Emergency Department Provider Note   ____________________________________________    I have reviewed the triage vital signs and the nursing notes.   HISTORY  Chief Complaint left leg infection     HPI Tina Short is a 64 y.o. female with a history of diabetes, hypertension, sepsis, recurrent left leg infections presents with concern for left leg cellulitis.  Patient reports 2 days ago she began having fevers, chills, fatigue.  She knew this indicated that she was developing another infection in her leg and sure enough her leg became erythematous this morning.  She called her PCP who told her to come to the hospital.  She continues to feel fatigued and has chills.  She does have a history of diabetes.  Has not take anything for this.  She reports her pain is burning in nature.  Denies Covid symptoms  Past Medical History:  Diagnosis Date  . Depression   . Diabetes ([place])   . Hyperlipidemia   . Hypertension   . Neuropathy due to type 2 diabetes mellitus ([place])   . Obesity, morbid ([place]) 11/15/2009  . Sleep apnea    cpap  . Swelling     Patient Active Problem List   Diagnosis Date Noted  . Esophageal dysphagia   . Sepsis ([place]) 08/10/2016  . Colon cancer screening 04/30/2016  . Major depression, recurrent, chronic ([place]) 10/30/2015  . Dyslipidemia associated with type 2 diabetes mellitus ([place]) 10/30/2015  . Vitamin D deficiency 07/27/2015  . Vitamin B12 deficiency 07/27/2015  . History of anemia 09/12/2014  . Carpal tunnel syndrome 09/12/2014  . Obstructive sleep apnea 09/12/2014  . Edema of extremities 09/12/2014  . Gout 09/12/2014  . Morbid obesity, unspecified obesity type ([place]) 11/15/2009  . Benign essential HTN 10/07/2006  . Type 2 diabetes mellitus, uncontrolled, with neuropathy ([place]) 10/07/2006  . Hyperlipidemia 10/07/2006    Past Surgical History:  Procedure Laterality Date  . ABDOMINAL HYSTERECTOMY    . COLONOSCOPY     . COLONOSCOPY WITH PROPOFOL N/A 05/14/2016   Procedure: COLONOSCOPY WITH PROPOFOL;  Surgeon:  Bellow, MD;  Location: [facility] ENDOSCOPY;  Service: Endoscopy;  Laterality: N/A;  . COLONOSCOPY WITH PROPOFOL N/A 04/15/2018   Procedure: COLONOSCOPY WITH PROPOFOL;  Surgeon: Lin Landsman, MD;  Location: [facility] ENDOSCOPY;  Service: Gastroenterology;  Laterality: N/A;  . ESOPHAGOGASTRODUODENOSCOPY (EGD) WITH PROPOFOL N/A 04/15/2018   Procedure: ESOPHAGOGASTRODUODENOSCOPY (EGD) WITH PROPOFOL;  Surgeon: Lin Landsman, MD;  Location: [facility] ENDOSCOPY;  Service: Gastroenterology;  Laterality: N/A;  . FOOT SURGERY Right    X2  . SHOULDER SURGERY Right   . WRIST SURGERY Bilateral     Prior to Admission medications   Medication Sig Start Date End Date Taking? Authorizing Provider  allopurinol (ZYLOPRIM) 100 MG tablet Take 1 tablet (100 mg total) by mouth 2 (two) times daily. 03/18/19   Steele Sizer, MD  atorvastatin (LIPITOR) 40 MG tablet Take 1 tablet (40 mg total) by mouth at bedtime. 03/18/19   Steele Sizer, MD  blood glucose meter kit and supplies Dispense based on patient and insurance preference. Use up to four times daily as directed. (FOR ICD-10 E10.9, E11.9). 11/03/18   Steele Sizer, MD  clotrimazole (LOTRIMIN) 1 % cream Apply 1 application topically 2 (two) times daily. 03/18/19   Steele Sizer, MD  fenofibrate (TRICOR) 145 MG tablet Take 1 tablet (145 mg total) by mouth daily. 03/20/19   Steele Sizer, MD  furosemide (LASIX) 40 MG tablet Daily Patient taking  differently: Take 40 mg by mouth as needed for edema. Daily 08/27/18   Steele Sizer, MD  hydrocortisone cream 0.5 % Apply 1 application topically 2 (two) times daily. 03/18/19   Steele Sizer, MD  losartan (COZAAR) 100 MG tablet Take 1 tablet (100 mg total) by mouth daily. 03/18/19   Steele Sizer, MD  ONETOUCH ULTRA test strip TEST UP TO FOUR TIMES A DAY AS DIRECTED 11/08/18   Ancil Boozer, Drue Stager, MD  potassium chloride SA  (K-DUR) 20 MEQ tablet TAKE 1 TABLET(20 MEQ) BY MOUTH DAILY Patient taking differently: Take 20 mEq by mouth as needed. TAKE 1 TABLET(20 MEQ) BY MOUTH DAILY 08/27/18   Steele Sizer, MD  Semaglutide,0.25 or 0.5MG/DOS, (OZEMPIC, 0.25 OR 0.5 MG/DOSE,) 2 MG/1.5ML SOPN Inject 0.5 mg into the skin once a week. 03/18/19   Steele Sizer, MD  venlafaxine XR (EFFEXOR-XR) 150 MG 24 hr capsule Take 1 capsule (150 mg total) by mouth daily with breakfast. 03/18/19   Steele Sizer, MD  Vitamin D, Ergocalciferol, (DRISDOL) 1.25 MG (50000 UNIT) CAPS capsule TAKE ONE CAPSULE BY MOUTH EVERY 7 DAYS 02/28/19   Steele Sizer, MD     Allergies Abilify [aripiprazole], Invokana [canagliflozin], Metformin and related, and Lovaza [omega-3-acid ethyl esters]  Family History  Problem Relation Age of Onset  . Diabetes Mother   . Cancer Mother        uterine  . Mental illness Father   . Obesity Daughter   . Diabetes Maternal Grandmother   . Heart disease Brother   . Obesity Daughter     Social History Social History   Tobacco Use  . Smoking status: Never Smoker  . Smokeless tobacco: Never Used  Substance Use Topics  . Alcohol use: No    Alcohol/week: 0.0 standard drinks  . Drug use: No    Review of Systems  Constitutional: Positive fevers Eyes: No visual changes.  ENT: No sore throat. Cardiovascular: Denies chest pain. Respiratory: Denies shortness of breath. Gastrointestinal: No abdominal pain.   Genitourinary: Negative for dysuria. Musculoskeletal: As above Skin: Negative for rash. Neurological: Negative for headaches   ____________________________________________   PHYSICAL EXAM:  VITAL SIGNS: ED Triage Vitals  Enc Vitals Group     BP 04/21/19 1221 (!) 108/57     Pulse Rate 04/21/19 1221 94     Resp 04/21/19 1221 17     Temp 04/21/19 1221 97.9 F (36.6 C)     Temp src --      SpO2 04/21/19 1221 95 %     Weight 04/21/19 1219 126.6 kg (279 lb 3.2 oz)     Height 04/21/19 1219 1.727  m (_0 )     Head Circumference --      Peak Flow --      Pain Score 04/21/19 1219 6     Pain Loc --      Pain Edu? --      Excl. in [place]? --     Constitutional: Alert and oriented.   Nose: No congestion/rhinnorhea. Mouth/Throat: Mucous membranes are moist.    Cardiovascular: Normal rate, regular rhythm.   Good peripheral circulation. Respiratory: Normal respiratory effort.  No retractions.  Gastrointestinal: Soft and nontender. No distention.  Musculoskeletal: Left lower leg: Erythema extending almost to the knee, positive swelling, no purulent discharge Neurologic:  Normal speech and language. No gross focal neurologic deficits are appreciated.  Skin:  Skin is warm, dry and intact, see above Psychiatric: Mood and affect are normal. Speech and behavior are normal.  ____________________________________________   LABS (all labs ordered are listed, but only abnormal results are displayed)  Labs Reviewed  LACTIC ACID, PLASMA - Abnormal; Notable for the following components:      Result Value   Lactic Acid, Venous 3.3 (*)    All other components within normal limits  COMPREHENSIVE METABOLIC PANEL - Abnormal; Notable for the following components:   Chloride 97 (*)    Glucose, Bld 300 (*)    Total Protein 8.3 (*)    Total Bilirubin 1.5 (*)    All other components within normal limits  CBC WITH DIFFERENTIAL/PLATELET - Abnormal; Notable for the following components:   Hemoglobin 15.9 (*)    All other components within normal limits  CULTURE, BLOOD (ROUTINE X 2)  CULTURE, BLOOD (ROUTINE X 2)  SARS CORONAVIRUS 2 (TAT 6-24 HRS)  LACTIC ACID, PLASMA  URINALYSIS, COMPLETE (UACMP) WITH MICROSCOPIC   ____________________________________________  EKG  None ____________________________________________  RADIOLOGY  None ____________________________________________   PROCEDURES  Procedure(s) performed: yes  Angiocath insertion Performed by: Lavonia Drafts  Consent:  Verbal consent obtained. Risks and benefits: risks, benefits and alternatives were discussed Time out: Immediately prior to procedure a "time out" was called to verify the correct patient, procedure, equipment, support staff and site/side marked as required.  Preparation: Patient was prepped and draped in the usual sterile fashion.  Vein Location: left arm  Ultrasound Guided  Gauge: 18  Normal blood return and flush without difficulty Patient tolerance: Patient tolerated the procedure well with no immediate complications.     Procedures   Critical Care performed: No ____________________________________________   INITIAL IMPRESSION / ASSESSMENT AND PLAN / ED COURSE  Pertinent labs & imaging results that were available during my care of the patient were reviewed by me and considered in my medical decision making (see chart for details).  Patient presents with exam and HPI consistent with left leg lower cellulitis, white blood cell count is normal however patient's lactic acid is elevated.  She is also had fevers at home and is a diabetic.  She is also been admitted multiple times for sepsis related to similar infection.  Given the above we will give IV antibiotics, after blood cultures and admit to the hospitalist service.  No tachycardia or fever or hypotension here, single elevated lactic acid  Discussed with Dr. Jimmye Norman of hospitalist service for admission   ____________________________________________   FINAL CLINICAL IMPRESSION(S) / ED DIAGNOSES  Final diagnoses:  Cellulitis of left lower extremity  Sepsis without acute organ dysfunction, due to unspecified organism [facility])        Note:  This document was prepared using Dragon voice recognition software and may include unintentional dictation errors.   Lavonia Drafts, MD 04/21/19 1512    Lavonia Drafts, MD 04/21/19 1540

## 2019-04-21 NOTE — Consult Note (Signed)
PHARMACY -  BRIEF ANTIBIOTIC NOTE   Pharmacy has received consult(s) for Cefepime and Vancomycin from an ED provider.  The patient's profile has been reviewed for ht/wt/allergies/indication/available labs.    One time order(s) placed for Vancomycin 1g x1 and Cefepime 2g x1.   Further antibiotics/pharmacy consults should be ordered by admitting physician if indicated.                       Thank you, Rowland Lathe 04/21/2019  3:05 PM

## 2019-04-21 NOTE — Patient Instructions (Signed)
After consultation with Dr. Ancil Boozer, referred to the emergency room for further care.

## 2019-04-22 ENCOUNTER — Encounter: Payer: Self-pay | Admitting: Internal Medicine

## 2019-04-22 DIAGNOSIS — R7881 Bacteremia: Secondary | ICD-10-CM

## 2019-04-22 LAB — CBC
HCT: 42.4 % (ref 36.0–46.0)
Hemoglobin: 14.5 g/dL (ref 12.0–15.0)
MCH: 31.7 pg (ref 26.0–34.0)
MCHC: 34.2 g/dL (ref 30.0–36.0)
MCV: 92.6 fL (ref 80.0–100.0)
Platelets: 150 10*3/uL (ref 150–400)
RBC: 4.58 MIL/uL (ref 3.87–5.11)
RDW: 13.5 % (ref 11.5–15.5)
WBC: 7.3 10*3/uL (ref 4.0–10.5)
nRBC: 0 % (ref 0.0–0.2)

## 2019-04-22 LAB — GLUCOSE, CAPILLARY
Glucose-Capillary: 209 mg/dL — ABNORMAL HIGH (ref 70–99)
Glucose-Capillary: 258 mg/dL — ABNORMAL HIGH (ref 70–99)
Glucose-Capillary: 213 mg/dL — ABNORMAL HIGH (ref 70–99)
Glucose-Capillary: 166 mg/dL — ABNORMAL HIGH (ref 70–99)

## 2019-04-22 LAB — LACTIC ACID, PLASMA
Lactic Acid, Venous: 2.2 mmol/L (ref 0.5–1.9)
Lactic Acid, Venous: 2.3 mmol/L (ref 0.5–1.9)
Lactic Acid, Venous: 3.5 mmol/L (ref 0.5–1.9)

## 2019-04-22 LAB — COMPREHENSIVE METABOLIC PANEL
ALT: 29 U/L (ref 0–44)
AST: 37 U/L (ref 15–41)
Albumin: 3.5 g/dL (ref 3.5–5.0)
Alkaline Phosphatase: 55 U/L (ref 38–126)
Anion gap: 12 (ref 5–15)
BUN: 15 mg/dL (ref 8–23)
CO2: 23 mmol/L (ref 22–32)
Calcium: 8.6 mg/dL — ABNORMAL LOW (ref 8.9–10.3)
Chloride: 102 mmol/L (ref 98–111)
Creatinine, Ser: 0.58 mg/dL (ref 0.44–1.00)
GFR calc Af Amer: >60 mL/min (ref >60)
GFR calc non Af Amer: >60 mL/min (ref >60)
Glucose, Bld: 241 mg/dL — ABNORMAL HIGH (ref 70–99)
Potassium: 3.4 mmol/L — ABNORMAL LOW (ref 3.5–5.1)
Sodium: 137 mmol/L (ref 135–145)
Total Bilirubin: 1.1 mg/dL (ref 0.3–1.2)
Total Protein: 7.6 g/dL (ref 6.5–8.1)

## 2019-04-22 LAB — SARS CORONAVIRUS 2 (TAT 6-24 HRS): SARS Coronavirus 2: NEGATIVE

## 2019-04-22 MED ORDER — POTASSIUM CHLORIDE CRYS ER 20 MEQ PO TBCR
40.0000 meq | EXTENDED_RELEASE_TABLET | Freq: Once | ORAL | Status: AC
  Administered 2019-04-22: 40 meq via ORAL
  Filled 2019-04-22: qty 2

## 2019-04-22 MED ORDER — SODIUM CHLORIDE 0.9 % IV SOLN
INTRAVENOUS | Status: DC | PRN
  Administered 2019-04-22 – 2019-04-23 (×3): 500 mL via INTRAVENOUS

## 2019-04-22 MED ORDER — VENLAFAXINE HCL ER 75 MG PO CP24
150.0000 mg | ORAL_CAPSULE | Freq: Every day | ORAL | Status: DC
  Administered 2019-04-22 – 2019-04-25 (×4): 150 mg via ORAL
  Filled 2019-04-22 (×4): qty 2

## 2019-04-22 MED ORDER — PHENOL 1.4 % MT LIQD
1.0000 | OROMUCOSAL | Status: DC | PRN
  Filled 2019-04-22 (×2): qty 177

## 2019-04-22 MED ORDER — SODIUM CHLORIDE 0.9 % IV SOLN: INTRAVENOUS | Status: DC

## 2019-04-22 MED ORDER — DIPHENHYDRAMINE HCL 25 MG PO CAPS: 50.0000 mg | ORAL_CAPSULE | Freq: Every evening | ORAL | Status: DC | PRN

## 2019-04-22 NOTE — Consult Note (Signed)
[place] Nurse Consult Note: Reason for Consult:LLE cellulitis with erythema and minimal weeping  tenderness Wound type:infectious Pressure Injury POA: NA Measurement: erythema from anterior lower leg to lateral aspect Wound [id number]  Drainage (amount, consistency, odor) weeping Periwound:edema to LLE Dressing procedure/placement/frequency: Open to air.  If weeping increases, apply Xeroform gauze to erythema.  Secure with kerlix.  Change daily.   Will not follow at this time.  Please re-consult if needed.  Domenic Moras MSN, RN, FNP-BC CWON Wound, Ostomy, Continence Nurse Pager [phone]

## 2019-04-22 NOTE — Progress Notes (Signed)
PROGRESS NOTE    Tina Short  [id number] DOB: Jan 20, 1956 DOA: 04/21/2019 PCP: Steele Sizer, MD      Assessment & Plan:   Active Problems:   Cellulitis   LLE cellulitis:  Improving. Will continue on IV cefepime & d/c IV vanco. Blood cx 1 of 2 growing gram neg rods. Non-toxic appearing, no fevers so far. Wound care recs apprec. [place] of LLE neg for DVT  Bacteremia: 1 of 2 growing gram neg rods, sens pending. Continue on IV cefepime  Lactic acidosis: continue on IVFs. Repeat lactic acid ordered  Morbid obesity: BMI 42.4. Would benefit greatly from weight loss  DM2: uncontrolled, HbA1c 8.1. Will hold home dose of ozempic. Will continue on SSI w/ accuchecks. Carb modified diet   Hypokalemia: KCl repleated. Will continue to monitor   Hyperbilirubinemia: resolved  Likely UTI: UA is positive, pt has GU symptoms & urine cx ordered but not sent yet. Notified pt's nurse. Continue on IV abxs  OSA: continue on CPAP at night  Depression: severity unknown. Will continue on home dose of venlafaxine   HLD: will continue on statin, fenofibrate   Gout: will continue on home dose of allopurinol    DVT prophylaxis: lovenox Code Status: full  Family Communication: discussed pt's care w/ pt's husband, Darrell, and answered his questions Disposition Plan: will likely d/c home in a couple of days as pt clinically improves   Consultants:   none   Procedures:    Antimicrobials: cefepime    Subjective: Pt c/o being hot and uncomfortable  Objective: Vitals:   04/21/19 1722 04/21/19 1959 04/22/19 0003 04/22/19 0409  BP: 124/71 131/62  131/74  Pulse: 87 85 82 78  Resp:  20 18 20   Temp: 99.8 F (37.7 C) 98.6 F (37 C)  98.4 F (36.9 C)  TempSrc: Oral Oral  Oral  SpO2: 98% 97% 97% 96%  Weight: 126.6 kg     Height: 5\' 8"  (1.727 m)       Intake/Output Summary (Last 24 hours) at 04/22/2019 0802 Last data filed at 04/22/2019 0510 Gross per 24 hour  Intake  217.24 ml  Output 400 ml  Net -182.76 ml   Filed Weights   04/21/19 1219 04/21/19 1722  Weight: 126.6 kg 126.6 kg    Examination:  General exam: Appears calm but uncomfortable  Respiratory system: diminished breath sounds b/l. No wheezes, rales Cardiovascular system: S1 & S2 +. No rubs, gallops or clicks. 3+ pitting edema of LLE Gastrointestinal system: Abdomen is obese, soft and nontender.  Hypoactive bowel sounds heard. Central nervous system: Alert and oriented. Moves all 4 extremies Skin: LLE erythema, warmth & tenderness to palpation  Psychiatry: Judgement and insight appear normal. Flat mood and affect    Data Reviewed: I have personally reviewed following labs and imaging studies  CBC: Recent Labs  Lab 04/21/19 1219 04/22/19 0453  WBC 7.9 7.3  NEUTROABS 5.9  --   HGB 15.9* 14.5  HCT 46.0 42.4  MCV 93.3 92.6  PLT 158 [id number]   Basic Metabolic Panel: Recent Labs  Lab 04/21/19 1219 04/22/19 0453  NA 135 137  K 3.6 3.4*  CL 97* 102  CO2 26 23  GLUCOSE 300* 241*  BUN 17 15  CREATININE 0.68 0.58  CALCIUM 8.9 8.6*   GFR: Estimated Creatinine Clearance: 99.8 mL/min (by C-G formula based on SCr of 0.58 mg/dL). Liver Function Tests: Recent Labs  Lab 04/21/19 1219 04/22/19 0453  AST 38 37  ALT 32 29  ALKPHOS 55 55  BILITOT 1.5* 1.1  PROT 8.3* 7.6  ALBUMIN 3.7 3.5   No results for input(s): LIPASE, AMYLASE in the last 168 hours. No results for input(s): AMMONIA in the last 168 hours. Coagulation Profile: No results for input(s): INR, PROTIME in the last 168 hours. Cardiac Enzymes: No results for input(s): CKTOTAL, CKMB, CKMBINDEX, TROPONINI in the last 168 hours. BNP (last 3 results) No results for input(s): PROBNP in the last 8760 hours. HbA1C: No results for input(s): HGBA1C in the last 72 hours. CBG: Recent Labs  Lab 04/21/19 1716 04/21/19 2113 04/22/19 0750  GLUCAP 194* 188* 209*   Lipid Profile: No results for input(s): CHOL, HDL, LDLCALC,  TRIG, CHOLHDL, LDLDIRECT in the last 72 hours. Thyroid Function Tests: No results for input(s): TSH, T4TOTAL, FREET4, T3FREE, THYROIDAB in the last 72 hours. Anemia Panel: No results for input(s): VITAMINB12, FOLATE, FERRITIN, TIBC, IRON, RETICCTPCT in the last 72 hours. Sepsis Labs: Recent Labs  Lab 04/21/19 1219 04/21/19 1421 04/22/19 0453  LATICACIDVEN 3.3* 3.0* 2.2*    Recent Results (from the past 240 hour(s))  Blood Culture (routine x 2)     Status: None (Preliminary result)   Collection Time: 04/21/19  3:23 PM   Specimen: BLOOD  Result Value Ref Range Status   Specimen Description BLOOD LEFT ANTECUBITAL  Final   Special Requests   Final    BOTTLES DRAWN AEROBIC AND ANAEROBIC Blood Culture results may not be optimal due to an excessive volume of blood received in culture bottles   Culture   Final    NO GROWTH < 24 HOURS Performed at [facility], [address] [place], [place] [address]    Report Status PENDING  Incomplete  Blood Culture (routine x 2)     Status: None (Preliminary result)   Collection Time: 04/21/19  3:23 PM   Specimen: BLOOD  Result Value Ref Range Status   Specimen Description BLOOD RIGHT ANTECUBITAL  Final   Special Requests   Final    BOTTLES DRAWN AEROBIC AND ANAEROBIC Blood Culture results may not be optimal due to an excessive volume of blood received in culture bottles   Culture   Final    NO GROWTH < 24 HOURS Performed at [facility], [address] [place], [place] [address]    Report Status PENDING  Incomplete  SARS CORONAVIRUS 2 (TAT 6-24 HRS) Nasopharyngeal Nasopharyngeal Swab     Status: None   Collection Time: 04/21/19  3:47 PM   Specimen: Nasopharyngeal Swab  Result Value Ref Range Status   SARS Coronavirus 2 NEGATIVE NEGATIVE Final    Comment: (NOTE) SARS-CoV-2 target nucleic acids are NOT DETECTED. The SARS-CoV-2 RNA is generally detectable in upper and lower respiratory specimens during the acute phase of  infection. Negative results do not preclude SARS-CoV-2 infection, do not rule out co-infections with other pathogens, and should not be used as the sole basis for treatment or other patient management decisions. Negative results must be combined with clinical observations, patient history, and epidemiological information. The expected result is Negative. Fact Sheet for Patients: SugarRoll.be Fact Sheet for Healthcare Providers: https://www.woods-mathews.com/ This test is not yet approved or cleared by the [place] FDA and  has been authorized for detection and/or diagnosis of SARS-CoV-2 by FDA under an Emergency Use Authorization (EUA). This EUA will remain  in effect (meaning this test can be used) for the duration of the COVID-19 declaration under Section 56 4(b)(1) of the Act, 21 U.S.C. section  360bbb-3(b)(1), unless the authorization is terminated or revoked sooner. Performed at [facility], [place] [address] [place], [place] [address]          Radiology Studies: US Venous Img Lower Unilateral Left (DVT)  Result Date: 04/21/2019 CLINICAL DATA:  Acute left leg pain and edema for 1 day EXAM: LEFT LOWER EXTREMITY VENOUS DOPPLER ULTRASOUND TECHNIQUE: Gray-scale sonography with graded compression, as well as color Doppler and duplex ultrasound were performed to evaluate the lower extremity deep venous systems from the level of the common femoral vein and including the common femoral, femoral, profunda femoral, popliteal and calf veins including the posterior tibial, peroneal and gastrocnemius veins when visible. The superficial great saphenous vein was also interrogated. Spectral Doppler was utilized to evaluate flow at rest and with distal augmentation maneuvers in the common femoral, femoral and popliteal veins. COMPARISON:  None. FINDINGS: Contralateral Common Femoral Vein: Respiratory phasicity is normal and symmetric with the  symptomatic side. No evidence of thrombus. Normal compressibility. Common Femoral Vein: No evidence of thrombus. Normal compressibility, respiratory phasicity and response to augmentation. Saphenofemoral Junction: No evidence of thrombus. Normal compressibility and flow on color Doppler imaging. Profunda Femoral Vein: No evidence of thrombus. Normal compressibility and flow on color Doppler imaging. Femoral Vein: No evidence of thrombus. Normal compressibility, respiratory phasicity and response to augmentation. Popliteal Vein: No evidence of thrombus. Normal compressibility, respiratory phasicity and response to augmentation. Calf Veins: Limited assessment of the calf veins without gross occlusive thrombus appreciated. IMPRESSION: No significant left lower extremity DVT. Limited assessment of the calf veins. Electronically Signed   By: Jerilynn Mages.  Shick M.D.   On: 04/21/2019 16:37        Scheduled Meds: . enoxaparin (LOVENOX) injection  40 mg Subcutaneous Q12H  . insulin aspart  0-15 Units Subcutaneous TID WC  . insulin aspart  0-5 Units Subcutaneous QHS  . potassium chloride  40 mEq Oral Once  . sodium chloride flush  3 mL Intravenous Once  . sodium chloride flush  3 mL Intravenous Q12H   Continuous Infusions: . sodium chloride    . ceFEPime (MAXIPIME) IV 2 g (04/22/19 0510)  . vancomycin       LOS: 1 day    Time spent: 33 mins     Wyvonnia Dusky, MD [facility] Pager [phone]  If 7PM-7AM, please contact night-coverage [email or web address] 04/22/2019, 8:02 AM

## 2019-04-22 NOTE — Progress Notes (Addendum)
PHARMACY - PHYSICIAN COMMUNICATION CRITICAL VALUE ALERT - BLOOD CULTURE IDENTIFICATION (BCID)  Tina Short is an 64 y.o. female who presented to [facility] on 04/21/2019 with a chief complaint of cellulitis  Assessment: 1 of 4 bottles (aerobic); gram-positive rods; lactic acid down from yesterday. Likely a contaminant.  Name of physician (or Provider) Contacted: Dr. Jimmye Norman   Current antibiotics: Cefepime, Vancomycin  Changes to prescribed antibiotics recommended:  D/c vancomycin   Results for orders placed or performed during the hospital encounter of 08/10/16  Blood Culture ID Panel (Reflexed) (Collected: 08/10/2016  6:30 AM)  Result Value Ref Range   Enterococcus species NOT DETECTED NOT DETECTED   Listeria monocytogenes NOT DETECTED NOT DETECTED   Staphylococcus species NOT DETECTED NOT DETECTED   Staphylococcus aureus (BCID) NOT DETECTED NOT DETECTED   Streptococcus species DETECTED (A) NOT DETECTED   Streptococcus agalactiae DETECTED (A) NOT DETECTED   Streptococcus pneumoniae NOT DETECTED NOT DETECTED   Streptococcus pyogenes NOT DETECTED NOT DETECTED   Acinetobacter baumannii NOT DETECTED NOT DETECTED   Enterobacteriaceae species NOT DETECTED NOT DETECTED   Enterobacter cloacae complex NOT DETECTED NOT DETECTED   Escherichia coli NOT DETECTED NOT DETECTED   Klebsiella oxytoca NOT DETECTED NOT DETECTED   Klebsiella pneumoniae NOT DETECTED NOT DETECTED   Proteus species NOT DETECTED NOT DETECTED   Serratia marcescens NOT DETECTED NOT DETECTED   Haemophilus influenzae NOT DETECTED NOT DETECTED   Neisseria meningitidis NOT DETECTED NOT DETECTED   Pseudomonas aeruginosa NOT DETECTED NOT DETECTED   Candida albicans NOT DETECTED NOT DETECTED   Candida glabrata NOT DETECTED NOT DETECTED   Candida krusei NOT DETECTED NOT DETECTED   Candida parapsilosis NOT DETECTED NOT DETECTED   Candida tropicalis NOT DETECTED NOT DETECTED    Rowland Lathe 04/22/2019  12:26 PM

## 2019-04-22 NOTE — Progress Notes (Signed)
Inpatient Diabetes Program Recommendations  AACE/ADA: New Consensus Statement on Inpatient Glycemic Control   Target Ranges:  Prepandial:   less than 140 mg/dL      Peak postprandial:   less than 180 mg/dL (1-2 hours)      Critically ill patients:  140 - 180 mg/dL   Results for JOVONDA, WOOLWORTH (MRN [id number]) as of 04/22/2019 09:03  Ref. Range 04/21/2019 17:16 04/21/2019 21:13 04/22/2019 07:50  Glucose-Capillary Latest Ref Range: 70 - 99 mg/dL 194 (H) 188 (H) 209 (H)   Review of Glycemic Control  Diabetes history: DM2 Outpatient Diabetes medications: Ozempic 0.5 mg Qweek Current orders for Inpatient glycemic control: Novolog 0-15 units TID with meals, Novolog 0-5 units QHS  Inpatient Diabetes Program Recommendations:   Insulin-Basal: Please consider ordering Lantus 6 units Q24H.  Thanks, Barnie Alderman, RN, MSN, CDE Diabetes Coordinator Inpatient Diabetes Program [phone] (Team Pager from 8am to 5pm)

## 2019-04-22 NOTE — Progress Notes (Addendum)
[employer] contacted about need to look at patient's home CPAP machine. Went to patient's room and visualized no frayed wires until [employer] can check at later time.

## 2019-04-23 LAB — GLUCOSE, CAPILLARY
Glucose-Capillary: 203 mg/dL — ABNORMAL HIGH (ref 70–99)
Glucose-Capillary: 209 mg/dL — ABNORMAL HIGH (ref 70–99)
Glucose-Capillary: 153 mg/dL — ABNORMAL HIGH (ref 70–99)
Glucose-Capillary: 149 mg/dL — ABNORMAL HIGH (ref 70–99)

## 2019-04-23 LAB — BASIC METABOLIC PANEL
Anion gap: 11 (ref 5–15)
BUN: 11 mg/dL (ref 8–23)
CO2: 21 mmol/L — ABNORMAL LOW (ref 22–32)
Calcium: 8.1 mg/dL — ABNORMAL LOW (ref 8.9–10.3)
Chloride: 106 mmol/L (ref 98–111)
Creatinine, Ser: 0.42 mg/dL — ABNORMAL LOW (ref 0.44–1.00)
GFR calc Af Amer: >60 mL/min (ref >60)
GFR calc non Af Amer: >60 mL/min (ref >60)
Glucose, Bld: 177 mg/dL — ABNORMAL HIGH (ref 70–99)
Potassium: 3.2 mmol/L — ABNORMAL LOW (ref 3.5–5.1)
Sodium: 138 mmol/L (ref 135–145)

## 2019-04-23 LAB — URINALYSIS, COMPLETE (UACMP) WITH MICROSCOPIC
Bacteria, UA: NONE SEEN
Bilirubin Urine: NEGATIVE
Glucose, UA: NEGATIVE mg/dL
Hgb urine dipstick: NEGATIVE
Ketones, ur: NEGATIVE mg/dL
Leukocytes,Ua: NEGATIVE
Nitrite: NEGATIVE
Protein, ur: NEGATIVE mg/dL
Specific Gravity, Urine: 1.018 (ref 1.005–1.030)
pH: 5 (ref 5.0–8.0)

## 2019-04-23 LAB — CBC
HCT: 37.7 % (ref 36.0–46.0)
Hemoglobin: 12.9 g/dL (ref 12.0–15.0)
MCH: 31.6 pg (ref 26.0–34.0)
MCHC: 34.2 g/dL (ref 30.0–36.0)
MCV: 92.4 fL (ref 80.0–100.0)
Platelets: 147 10*3/uL — ABNORMAL LOW (ref 150–400)
RBC: 4.08 MIL/uL (ref 3.87–5.11)
RDW: 13.3 % (ref 11.5–15.5)
WBC: 4.5 10*3/uL (ref 4.0–10.5)
nRBC: 0 % (ref 0.0–0.2)

## 2019-04-23 LAB — LACTIC ACID, PLASMA
Lactic Acid, Venous: 2.1 mmol/L (ref 0.5–1.9)
Lactic Acid, Venous: 2.4 mmol/L (ref 0.5–1.9)

## 2019-04-23 MED ORDER — POTASSIUM CHLORIDE CRYS ER 20 MEQ PO TBCR
40.0000 meq | EXTENDED_RELEASE_TABLET | Freq: Two times a day (BID) | ORAL | Status: AC
  Administered 2019-04-23 (×2): 40 meq via ORAL
  Filled 2019-04-23 (×2): qty 2

## 2019-04-23 MED ORDER — SODIUM BICARBONATE 650 MG PO TABS
650.0000 mg | ORAL_TABLET | Freq: Every day | ORAL | Status: DC
  Administered 2019-04-23 – 2019-04-25 (×3): 650 mg via ORAL
  Filled 2019-04-23 (×3): qty 1

## 2019-04-23 NOTE — Progress Notes (Signed)
PROGRESS NOTE    Tina Short  [id number] DOB: 01-31-1956 DOA: 04/21/2019 PCP: Steele Sizer, MD      Assessment & Plan:   Active Problems:   Cellulitis   LLE cellulitis:  Improving. Will continue on IV cefepime . Blood cx 1 of 2 growing corynebacterium, sens not available as per micro lab. Non-toxic appearing, no fevers so far. Wound care recs apprec. [place] of LLE neg for DVT  Bacteremia: Blood cx 1 of 2 growing corynebacterium, sens not available as per micro lab. Continue on IV cefepime. Echo ordered  Lactic acidosis: continue on IVFs. Repeat lactic acid ordered  Thrombocytopenia: etiology unclear. Will continue to monitor   Morbid obesity: BMI 42.4. Would benefit greatly from weight loss  DM2: uncontrolled, HbA1c 8.1. Will hold home dose of ozempic. Will continue on SSI w/ accuchecks. Carb modified diet   Hypokalemia: KCl repleated. Will continue to monitor   Hyperbilirubinemia: resolved  Likely UTI: UA is positive, pt has GU symptoms & urine cx ordered but not sent yet. Notified pt's nurse but still not done, nurse notified again today. Continue on IV abxs  OSA: continue on CPAP at night  Depression: severity unknown. Will continue on home dose of venlafaxine   HLD: will continue on statin, fenofibrate   Gout: will continue on home dose of allopurinol    DVT prophylaxis: lovenox Code Status: full  Family Communication: called pt's husband, Darrell, but no answer  Disposition Plan: will likely d/c home in a couple of days as pt clinically improves   Consultants:   none   Procedures:    Antimicrobials: cefepime    Subjective: Pt c/o fatigue   Objective: Vitals:   04/22/19 0409 04/22/19 1407 04/22/19 2010 04/23/19 0528  BP: 131/74 134/69 (!) 150/63 (!) 115/55  Pulse: 78 80 81 71  Resp: 20  20 20   Temp: 98.4 F (36.9 C) (!) 97.4 F (36.3 C) 98 F (36.7 C) 97.9 F (36.6 C)  TempSrc: Oral Oral Oral Oral  SpO2: 96% 97% 98% 95%   Weight:      Height:        Intake/Output Summary (Last 24 hours) at 04/23/2019 0826 Last data filed at 04/23/2019 0815 Gross per 24 hour  Intake 1815.38 ml  Output 900 ml  Net 915.38 ml   Filed Weights   04/21/19 1219 04/21/19 1722  Weight: 126.6 kg 126.6 kg    Examination:  General exam: Appears calm but uncomfortable  Respiratory system: decreased breath sounds b/l. No rhonchi Cardiovascular system: S1 & S2 +. No rubs, gallops or clicks. 3+ pitting edema of LLE Gastrointestinal system: Abdomen is obese, soft and nontender.  Hypoactive bowel sounds heard. Central nervous system: Alert and oriented. Moves all 4 extremies Skin: LLE erythema, warmth & tenderness to palpation  Psychiatry: Judgement and insight appear normal. Flat mood and affect    Data Reviewed: I have personally reviewed following labs and imaging studies  CBC: Recent Labs  Lab 04/21/19 1219 04/22/19 0453 04/23/19 0504  WBC 7.9 7.3 4.5  NEUTROABS 5.9  --   --   HGB 15.9* 14.5 12.9  HCT 46.0 42.4 37.7  MCV 93.3 92.6 92.4  PLT 158 150 [id number]*   Basic Metabolic Panel: Recent Labs  Lab 04/21/19 1219 04/22/19 0453 04/23/19 0504  NA 135 137 138  K 3.6 3.4* 3.2*  CL 97* 102 106  CO2 26 23 21*  GLUCOSE 300* 241* 177*  BUN 17 15 11   CREATININE 0.68 0.58 0.42*  CALCIUM 8.9 8.6* 8.1*   GFR: Estimated Creatinine Clearance: 99.8 mL/min (A) (by C-G formula based on SCr of 0.42 mg/dL (L)). Liver Function Tests: Recent Labs  Lab 04/21/19 1219 04/22/19 0453  AST 38 37  ALT 32 29  ALKPHOS 55 55  BILITOT 1.5* 1.1  PROT 8.3* 7.6  ALBUMIN 3.7 3.5   No results for input(s): LIPASE, AMYLASE in the last 168 hours. No results for input(s): AMMONIA in the last 168 hours. Coagulation Profile: No results for input(s): INR, PROTIME in the last 168 hours. Cardiac Enzymes: No results for input(s): CKTOTAL, CKMB, CKMBINDEX, TROPONINI in the last 168 hours. BNP (last 3 results) No results for input(s):  PROBNP in the last 8760 hours. HbA1C: No results for input(s): HGBA1C in the last 72 hours. CBG: Recent Labs  Lab 04/22/19 0750 04/22/19 1153 04/22/19 1816 04/22/19 2113 04/23/19 0748  GLUCAP 209* 258* 213* 166* 203*   Lipid Profile: No results for input(s): CHOL, HDL, LDLCALC, TRIG, CHOLHDL, LDLDIRECT in the last 72 hours. Thyroid Function Tests: No results for input(s): TSH, T4TOTAL, FREET4, T3FREE, THYROIDAB in the last 72 hours. Anemia Panel: No results for input(s): VITAMINB12, FOLATE, FERRITIN, TIBC, IRON, RETICCTPCT in the last 72 hours. Sepsis Labs: Recent Labs  Lab 04/21/19 1421 04/22/19 0453 04/22/19 0854 04/22/19 1549  LATICACIDVEN 3.0* 2.2* 2.3* 3.5*    Recent Results (from the past 240 hour(s))  Blood Culture (routine x 2)     Status: None (Preliminary result)   Collection Time: 04/21/19  3:23 PM   Specimen: BLOOD  Result Value Ref Range Status   Specimen Description BLOOD LEFT ANTECUBITAL  Final   Special Requests   Final    BOTTLES DRAWN AEROBIC AND ANAEROBIC Blood Culture results may not be optimal due to an excessive volume of blood received in culture bottles   Culture  Setup Time   Final    GRAM POSITIVE RODS AEROBIC BOTTLE ONLY CRITICAL RESULT CALLED TO, READ BACK BY AND VERIFIED WITH: ABBY ELLINGTON ON 04/22/19 AT 1223 QSD Performed at [facility], [address] [place], [place] [address]    Culture GRAM POSITIVE RODS  Final   Report Status PENDING  Incomplete  Blood Culture (routine x 2)     Status: None (Preliminary result)   Collection Time: 04/21/19  3:23 PM   Specimen: BLOOD  Result Value Ref Range Status   Specimen Description BLOOD RIGHT ANTECUBITAL  Final   Special Requests   Final    BOTTLES DRAWN AEROBIC AND ANAEROBIC Blood Culture results may not be optimal due to an excessive volume of blood received in culture bottles   Culture   Final    NO GROWTH 2 DAYS Performed at [facility], [address]  [place], [place] [address]    Report Status PENDING  Incomplete  SARS CORONAVIRUS 2 (TAT 6-24 HRS) Nasopharyngeal Nasopharyngeal Swab     Status: None   Collection Time: 04/21/19  3:47 PM   Specimen: Nasopharyngeal Swab  Result Value Ref Range Status   SARS Coronavirus 2 NEGATIVE NEGATIVE Final    Comment: (NOTE) SARS-CoV-2 target nucleic acids are NOT DETECTED. The SARS-CoV-2 RNA is generally detectable in upper and lower respiratory specimens during the acute phase of infection. Negative results do not preclude SARS-CoV-2 infection, do not rule out co-infections with other pathogens, and should not be used as the sole basis for treatment or other patient management decisions. Negative results must be combined with clinical observations, patient history, and  epidemiological information. The expected result is Negative. Fact Sheet for Patients: SugarRoll.be Fact Sheet for Healthcare Providers: https://www.woods-mathews.com/ This test is not yet approved or cleared by the [place] FDA and  has been authorized for detection and/or diagnosis of SARS-CoV-2 by FDA under an Emergency Use Authorization (EUA). This EUA will remain  in effect (meaning this test can be used) for the duration of the COVID-19 declaration under Section 56 4(b)(1) of the Act, 21 U.S.C. section 360bbb-3(b)(1), unless the authorization is terminated or revoked sooner. Performed at [facility], [place] [address], [place], [place] [address]          Radiology Studies: US Venous Img Lower Unilateral Left (DVT)  Result Date: 04/21/2019 CLINICAL DATA:  Acute left leg pain and edema for 1 day EXAM: LEFT LOWER EXTREMITY VENOUS DOPPLER ULTRASOUND TECHNIQUE: Gray-scale sonography with graded compression, as well as color Doppler and duplex ultrasound were performed to evaluate the lower extremity deep venous systems from the level of the common femoral vein and including the  common femoral, femoral, profunda femoral, popliteal and calf veins including the posterior tibial, peroneal and gastrocnemius veins when visible. The superficial great saphenous vein was also interrogated. Spectral Doppler was utilized to evaluate flow at rest and with distal augmentation maneuvers in the common femoral, femoral and popliteal veins. COMPARISON:  None. FINDINGS: Contralateral Common Femoral Vein: Respiratory phasicity is normal and symmetric with the symptomatic side. No evidence of thrombus. Normal compressibility. Common Femoral Vein: No evidence of thrombus. Normal compressibility, respiratory phasicity and response to augmentation. Saphenofemoral Junction: No evidence of thrombus. Normal compressibility and flow on color Doppler imaging. Profunda Femoral Vein: No evidence of thrombus. Normal compressibility and flow on color Doppler imaging. Femoral Vein: No evidence of thrombus. Normal compressibility, respiratory phasicity and response to augmentation. Popliteal Vein: No evidence of thrombus. Normal compressibility, respiratory phasicity and response to augmentation. Calf Veins: Limited assessment of the calf veins without gross occlusive thrombus appreciated. IMPRESSION: No significant left lower extremity DVT. Limited assessment of the calf veins. Electronically Signed   By: Jerilynn Mages.  Shick M.D.   On: 04/21/2019 16:37        Scheduled Meds: . enoxaparin (LOVENOX) injection  40 mg Subcutaneous Q12H  . insulin aspart  0-15 Units Subcutaneous TID WC  . insulin aspart  0-5 Units Subcutaneous QHS  . sodium chloride flush  3 mL Intravenous Once  . sodium chloride flush  3 mL Intravenous Q12H  . venlafaxine XR  150 mg Oral Q breakfast   Continuous Infusions: . sodium chloride 75 mL/hr at 04/23/19 0640  . sodium chloride 10 mL/hr at 04/23/19 0640  . ceFEPime (MAXIPIME) IV Stopped (04/23/19 [id number])     LOS: 2 days    Time spent: 30 mins     Wyvonnia Dusky, MD [facility] Pager [phone]  If 7PM-7AM, please contact night-coverage [email or web address] 04/23/2019, 8:26 AM

## 2019-04-24 DIAGNOSIS — D696 Thrombocytopenia, unspecified: Secondary | ICD-10-CM

## 2019-04-24 LAB — CBC
HCT: 38.2 % (ref 36.0–46.0)
Hemoglobin: 13.1 g/dL (ref 12.0–15.0)
MCH: 32.3 pg (ref 26.0–34.0)
MCHC: 34.3 g/dL (ref 30.0–36.0)
MCV: 94.3 fL (ref 80.0–100.0)
Platelets: 146 10*3/uL — ABNORMAL LOW (ref 150–400)
RBC: 4.05 MIL/uL (ref 3.87–5.11)
RDW: 13.2 % (ref 11.5–15.5)
WBC: 4.3 10*3/uL (ref 4.0–10.5)
nRBC: 0 % (ref 0.0–0.2)

## 2019-04-24 LAB — BASIC METABOLIC PANEL
Anion gap: 9 (ref 5–15)
BUN: 9 mg/dL (ref 8–23)
CO2: 21 mmol/L — ABNORMAL LOW (ref 22–32)
Calcium: 8 mg/dL — ABNORMAL LOW (ref 8.9–10.3)
Chloride: 110 mmol/L (ref 98–111)
Creatinine, Ser: 0.41 mg/dL — ABNORMAL LOW (ref 0.44–1.00)
GFR calc Af Amer: >60 mL/min (ref >60)
GFR calc non Af Amer: >60 mL/min (ref >60)
Glucose, Bld: 188 mg/dL — ABNORMAL HIGH (ref 70–99)
Potassium: 3.9 mmol/L (ref 3.5–5.1)
Sodium: 140 mmol/L (ref 135–145)

## 2019-04-24 LAB — GLUCOSE, CAPILLARY
Glucose-Capillary: 203 mg/dL — ABNORMAL HIGH (ref 70–99)
Glucose-Capillary: 209 mg/dL — ABNORMAL HIGH (ref 70–99)
Glucose-Capillary: 147 mg/dL — ABNORMAL HIGH (ref 70–99)
Glucose-Capillary: 185 mg/dL — ABNORMAL HIGH (ref 70–99)

## 2019-04-24 LAB — CULTURE, BLOOD (ROUTINE X 2)

## 2019-04-24 LAB — LACTIC ACID, PLASMA: Lactic Acid, Venous: 1.7 mmol/L (ref 0.5–1.9)

## 2019-04-24 MED ORDER — MAGIC MOUTHWASH
15.0000 mL | Freq: Four times a day (QID) | ORAL | Status: DC
  Administered 2019-04-24 – 2019-04-25 (×4): 15 mL via ORAL
  Filled 2019-04-24 (×7): qty 15

## 2019-04-24 MED ORDER — FLUCONAZOLE 50 MG PO TABS
150.0000 mg | ORAL_TABLET | Freq: Once | ORAL | Status: AC
  Administered 2019-04-24: 150 mg via ORAL
  Filled 2019-04-24: qty 1

## 2019-04-24 MED ORDER — MAGIC MOUTHWASH
15.0000 mL | Freq: Four times a day (QID) | ORAL | Status: DC
  Filled 2019-04-24 (×5): qty 20

## 2019-04-24 NOTE — Progress Notes (Signed)
PROGRESS NOTE    Tina Short  [id number] DOB: 04-09-1955 DOA: 04/21/2019 PCP: Steele Sizer, MD      Assessment & Plan:   Active Problems:   Cellulitis   LLE cellulitis: improving. Will continue on IV cefepime. Non-toxic appearing, no fevers so far. Wound care recs apprec. [place] of LLE neg for DVT  Bacteremia: Blood cx 1 of 2 growing corynebacterium, sens not available as per micro lab. Continue on IV cefepime. Repeat blood cxs ordered. Echo ordered  Lactic acidosis: resolved  Thrombocytopenia: etiology unclear. Will continue to monitor   Morbid obesity: BMI 42.4. Would benefit greatly from weight loss  DM2: uncontrolled, HbA1c 8.1. Will hold home dose of ozempic. Will continue on SSI w/ accuchecks. Carb modified diet   Hypokalemia: KCl repleated. Will continue to monitor   Hyperbilirubinemia: resolved  Likely UTI: UA is positive, pt has GU symptoms but unfortunately urine cx was not collected until 04/23/19 despite being ordered on 04/21/19 which will likely show no growth. Continue on IV abxs  OSA: continue on CPAP at night  Depression: severity unknown. Will continue on home dose of venlafaxine   HLD: will continue on statin, fenofibrate   Gout: will continue on home dose of allopurinol    DVT prophylaxis: lovenox Code Status: full  Family Communication:  Disposition Plan: will likely d/c home in a couple of days as pt clinically improves   Consultants:   none   Procedures:    Antimicrobials: cefepime    Subjective: Pt c/o fatigue   Objective: Vitals:   04/23/19 0528 04/23/19 1154 04/23/19 2124 04/24/19 0457  BP: (!) 115/55 140/72 (!) 153/97 140/71  Pulse: 71 75 78 71  Resp: 20  20 18   Temp: 97.9 F (36.6 C) 97.9 F (36.6 C) 98.5 F (36.9 C) 97.8 F (36.6 C)  TempSrc: Oral  Oral Oral  SpO2: 95% 97% 98% 95%  Weight:      Height:        Intake/Output Summary (Last 24 hours) at 04/24/2019 0816 Last data filed at 04/24/2019  [id number] Gross per 24 hour  Intake 1466.71 ml  Output 700 ml  Net 766.71 ml   Filed Weights   04/21/19 1219 04/21/19 1722  Weight: 126.6 kg 126.6 kg    Examination:  General exam: Appears calm but uncomfortable  Respiratory system: diminished breath sounds b/l. No wheezes, rales  Cardiovascular system: S1 & S2 +. No rubs, gallops or clicks. 3+ pitting edema of LLE Gastrointestinal system: Abdomen is obese, soft and nontender. Normal bowel sounds heard. Central nervous system: Alert and oriented. Moves all 4 extremies Skin: LLE erythema, warmth & tenderness to palpation  Psychiatry: Judgement and insight appear normal. Flat mood and affect    Data Reviewed: I have personally reviewed following labs and imaging studies  CBC: Recent Labs  Lab 04/21/19 1219 04/22/19 0453 04/23/19 0504  WBC 7.9 7.3 4.5  NEUTROABS 5.9  --   --   HGB 15.9* 14.5 12.9  HCT 46.0 42.4 37.7  MCV 93.3 92.6 92.4  PLT 158 150 [id number]*   Basic Metabolic Panel: Recent Labs  Lab 04/21/19 1219 04/22/19 0453 04/23/19 0504  NA 135 137 138  K 3.6 3.4* 3.2*  CL 97* 102 106  CO2 26 23 21*  GLUCOSE 300* 241* 177*  BUN 17 15 11   CREATININE 0.68 0.58 0.42*  CALCIUM 8.9 8.6* 8.1*   GFR: Estimated Creatinine Clearance: 99.8 mL/min (A) (by C-G formula based on SCr of 0.42 mg/dL (L)).  Liver Function Tests: Recent Labs  Lab 04/21/19 1219 04/22/19 0453  AST 38 37  ALT 32 29  ALKPHOS 55 55  BILITOT 1.5* 1.1  PROT 8.3* 7.6  ALBUMIN 3.7 3.5   No results for input(s): LIPASE, AMYLASE in the last 168 hours. No results for input(s): AMMONIA in the last 168 hours. Coagulation Profile: No results for input(s): INR, PROTIME in the last 168 hours. Cardiac Enzymes: No results for input(s): CKTOTAL, CKMB, CKMBINDEX, TROPONINI in the last 168 hours. BNP (last 3 results) No results for input(s): PROBNP in the last 8760 hours. HbA1C: No results for input(s): HGBA1C in the last 72 hours. CBG: Recent Labs  Lab  04/23/19 0748 04/23/19 1152 04/23/19 1705 04/23/19 2120 04/24/19 0752  GLUCAP 203* 209* 153* 149* 203*   Lipid Profile: No results for input(s): CHOL, HDL, LDLCALC, TRIG, CHOLHDL, LDLDIRECT in the last 72 hours. Thyroid Function Tests: No results for input(s): TSH, T4TOTAL, FREET4, T3FREE, THYROIDAB in the last 72 hours. Anemia Panel: No results for input(s): VITAMINB12, FOLATE, FERRITIN, TIBC, IRON, RETICCTPCT in the last 72 hours. Sepsis Labs: Recent Labs  Lab 04/22/19 0854 04/22/19 1549 04/23/19 0852 04/23/19 1141  LATICACIDVEN 2.3* 3.5* 2.1* 2.4*    Recent Results (from the past 240 hour(s))  Blood Culture (routine x 2)     Status: Abnormal (Preliminary result)   Collection Time: 04/21/19  3:23 PM   Specimen: BLOOD  Result Value Ref Range Status   Specimen Description   Final    BLOOD LEFT ANTECUBITAL Performed at [facility], [address], [place], [place] [address]    Special Requests   Final    BOTTLES DRAWN AEROBIC AND ANAEROBIC Blood Culture results may not be optimal due to an excessive volume of blood received in culture bottles Performed at [facility], [address], [place], [place] [address]    Culture  Setup Time   Final    GRAM POSITIVE RODS AEROBIC BOTTLE ONLY CRITICAL RESULT CALLED TO, READ BACK BY AND VERIFIED WITH: ABBY ELLINGTON ON 04/22/19 AT 1223 QSD Performed at [facility], [address] [place], [place] [address]    Culture (A)  Final    DIPHTHEROIDS(CORYNEBACTERIUM SPECIES) Standardized susceptibility testing for this organism is not available. Performed at [facility], [place] [address] [place], [place] [address]    Report Status PENDING  Incomplete  Blood Culture (routine x 2)     Status: None (Preliminary result)   Collection Time: 04/21/19  3:23 PM   Specimen: BLOOD  Result Value Ref Range Status   Specimen Description BLOOD RIGHT ANTECUBITAL  Final   Special Requests   Final    BOTTLES  DRAWN AEROBIC AND ANAEROBIC Blood Culture results may not be optimal due to an excessive volume of blood received in culture bottles   Culture   Final    NO GROWTH 3 DAYS Performed at [facility], [address] [place], [place] [address]    Report Status PENDING  Incomplete  SARS CORONAVIRUS 2 (TAT 6-24 HRS) Nasopharyngeal Nasopharyngeal Swab     Status: None   Collection Time: 04/21/19  3:47 PM   Specimen: Nasopharyngeal Swab  Result Value Ref Range Status   SARS Coronavirus 2 NEGATIVE NEGATIVE Final    Comment: (NOTE) SARS-CoV-2 target nucleic acids are NOT DETECTED. The SARS-CoV-2 RNA is generally detectable in upper and lower respiratory specimens during the acute phase of infection. Negative results do not preclude SARS-CoV-2 infection, do not  rule out co-infections with other pathogens, and should not be used as the sole basis for treatment or other patient management decisions. Negative results must be combined with clinical observations, patient history, and epidemiological information. The expected result is Negative. Fact Sheet for Patients: SugarRoll.be Fact Sheet for Healthcare Providers: https://www.woods-mathews.com/ This test is not yet approved or cleared by the [place] FDA and  has been authorized for detection and/or diagnosis of SARS-CoV-2 by FDA under an Emergency Use Authorization (EUA). This EUA will remain  in effect (meaning this test can be used) for the duration of the COVID-19 declaration under Section 56 4(b)(1) of the Act, 21 U.S.C. section 360bbb-3(b)(1), unless the authorization is terminated or revoked sooner. Performed at [facility], [place] [address] [place],  [address]          Radiology Studies: No results found.      Scheduled Meds: . enoxaparin (LOVENOX) injection  40 mg Subcutaneous Q12H  . insulin aspart  0-15 Units Subcutaneous TID WC  . insulin aspart   0-5 Units Subcutaneous QHS  . sodium bicarbonate  650 mg Oral Daily  . sodium chloride flush  3 mL Intravenous Once  . sodium chloride flush  3 mL Intravenous Q12H  . venlafaxine XR  150 mg Oral Q breakfast   Continuous Infusions: . sodium chloride 100 mL/hr at 04/24/19 0125  . sodium chloride 500 mL (04/23/19 1421)  . ceFEPime (MAXIPIME) IV 2 g (04/24/19 0545)     LOS: 3 days    Time spent: 30 mins     Wyvonnia Dusky, MD [facility] Pager [phone]  If 7PM-7AM, please contact night-coverage [email or web address] 04/24/2019, 8:16 AM

## 2019-04-24 NOTE — Evaluation (Signed)
Physical Therapy Evaluation Patient Details Name: Tina Short MRN: [id number] DOB: [birth year] Today's Date: 04/24/2019   History of Present Illness  Tina Short is a 64 y.o. female with a history of diabetes, hypertension, sepsis, recurrent left leg infections presents with concern for left leg cellulitis.  Patient reports 2 days ago she began having fevers, chills, fatigue.  She knew this indicated that she was developing another infection in her leg and sure enough her leg became erythematous this morning.  She called her PCP who told her to come to the hospital.  She continues to feel fatigued and has chills.  She does have a history of diabetes.  Has not take anything for this. PLOF ind with all ADLs, no AD, ind with community errands and distances, and recently retired  Clinical Impression  Pt is ind with all bed mobility and STS transfer. Is able to ambulate 236f without AD with some decreased R stance and L step length d/t R knee discomfort that she reports is "normal" for her. She does report some fatigue following ambulation, and notes she thinks she is de-conditioned d/t hospital stay. Patient is not appropriate for continued acute care PT as she is ind with household mobility, but is a good candidate for outpatient services to address high level balance deficits and decreased endurance.     Follow Up Recommendations Outpatient PT    Equipment Recommendations  Rolling walker with 5" wheels    Recommendations for Other Services       Precautions / Restrictions        Mobility  Bed Mobility Overal bed mobility: Independent                Transfers Overall transfer level: Needs assistance   Transfers: Sit to/from Stand Sit to Stand: Independent            Ambulation/Gait Ambulation/Gait assistance: Modified independent (Device/Increase time) Gait Distance (Feet): 200 Feet     Gait velocity: decreased   General Gait Details: decreased R stance and L  step length d/t R knee discomfort, pt reports its "bone on bone", increased time needed  Stairs            Wheelchair Mobility    Modified Rankin (Stroke Patients Only)       Balance Overall balance assessment: Needs assistance Sitting-balance support: No upper extremity supported;Feet supported Sitting balance-Leahy Scale: Good     Standing balance support: During functional activity Standing balance-Leahy Scale: Good   Single Leg Stance - Right Leg: 0 Single Leg Stance - Left Leg: 0 Tandem Stance - Right Leg: 10 Tandem Stance - Left Leg: 10 Rhomberg - Eyes Opened: 30                   Pertinent Vitals/Pain Pain Assessment: No/denies pain    Home Living Family/patient expects to be discharged to:: Private residence Living Arrangements: Spouse/significant other Available Help at Discharge: Family Type of Home: House Home Access: Ramped entrance Entrance Stairs-Rails: None   Home Layout: Multi-level Home Equipment: Grab bars - tub/shower Additional Comments: REports the person that lived in the home before her made it more adaptable    Prior Function Level of Independence: Independent         Comments: Just retired, no falls     Hand Dominance   Dominant Hand: Right    Extremity/Trunk Assessment   Upper Extremity Assessment Upper Extremity Assessment: Overall WFL for tasks assessed    Lower Extremity Assessment  Lower Extremity Assessment: Overall WFL for tasks assessed    Cervical / Trunk Assessment Cervical / Trunk Assessment: Normal  Communication   Communication: No difficulties  Cognition Arousal/Alertness: Awake/alert Behavior During Therapy: WFL for tasks assessed/performed Overall Cognitive Status: Within Functional Limits for tasks assessed                                        General Comments      Exercises Other Exercises Other Exercises: supine > sit > stand IND with no difficulty Other Exercises:  Patient able to ambulate 260f with supervision with minimal difficulty other than some R knee discomfort that she reports is ongoing d/t "needing a R knee replacement" Other Exercises: tandem bilat with gaurding needed and minA to resestablish balance following LOB with R foot forward, both under 10sec. CGA for SLS stance attempt, patient unable. Rhomberg eyes open and closed x30sec with CGA for safety.   Assessment/Plan    PT Assessment All further PT needs can be met in the next venue of care  PT Problem List Decreased strength;Decreased balance;Decreased coordination       PT Treatment Interventions DME instruction;Therapeutic activities;Therapeutic exercise;Balance training;Functional mobility training;Neuromuscular re-education;Patient/family education    PT Goals (Current goals can be found in the Care Plan section)  Acute Rehab PT Goals Patient Stated Goal: go home PT Goal Formulation: With patient Time For Goal Achievement: 05/08/19 Potential to Achieve Goals: Fair    Frequency     Barriers to discharge        Co-evaluation               AM-PAC PT "6 Clicks" Mobility  Outcome Measure Help needed turning from your back to your side while in a flat bed without using bedrails?: None Help needed moving from lying on your back to sitting on the side of a flat bed without using bedrails?: None Help needed moving to and from a bed to a chair (including a wheelchair)?: None Help needed standing up from a chair using your arms (e.g., wheelchair or bedside chair)?: None Help needed to walk in hospital room?: None Help needed climbing 3-5 steps with a railing? : A Little 6 Click Score: 23    End of Session Equipment Utilized During Treatment: Gait belt Activity Tolerance: Patient tolerated treatment well Patient left: in chair;with call bell/phone within reach Nurse Communication: Mobility status PT Visit Diagnosis: Unsteadiness on feet (R26.81);Muscle weakness  (generalized) (M62.81);Difficulty in walking, not elsewhere classified (R26.2)    Time: 19983-3825PT Time Calculation (min) (ACUTE ONLY): 23 min   Charges:   PT Evaluation $PT Eval Moderate Complexity: 1 Mod PT Treatments $Therapeutic Activity: 23-37 mins        CShelton SilvasPT, DPT CShelton Silvas3/14/2021, 3:41 PM

## 2019-04-25 ENCOUNTER — Inpatient Hospital Stay
Admit: 2019-04-25 | Discharge: 2019-04-25 | Disposition: A | Discharge status: Home / Self Care | Payer: Managed Care, Other (non HMO) | Attending: Internal Medicine | Admitting: Internal Medicine

## 2019-04-25 DIAGNOSIS — E876 Hypokalemia: Secondary | ICD-10-CM

## 2019-04-25 LAB — BASIC METABOLIC PANEL
Anion gap: 10 (ref 5–15)
BUN: 8 mg/dL (ref 8–23)
CO2: 22 mmol/L (ref 22–32)
Calcium: 8.2 mg/dL — ABNORMAL LOW (ref 8.9–10.3)
Chloride: 110 mmol/L (ref 98–111)
Creatinine, Ser: 0.44 mg/dL (ref 0.44–1.00)
GFR calc Af Amer: >60 mL/min (ref >60)
GFR calc non Af Amer: >60 mL/min (ref >60)
Glucose, Bld: 176 mg/dL — ABNORMAL HIGH (ref 70–99)
Potassium: 3.4 mmol/L — ABNORMAL LOW (ref 3.5–5.1)
Sodium: 142 mmol/L (ref 135–145)

## 2019-04-25 LAB — CBC
HCT: 34.9 % — ABNORMAL LOW (ref 36.0–46.0)
Hemoglobin: 12.2 g/dL (ref 12.0–15.0)
MCH: 32.8 pg (ref 26.0–34.0)
MCHC: 35 g/dL (ref 30.0–36.0)
MCV: 93.8 fL (ref 80.0–100.0)
Platelets: 144 10*3/uL — ABNORMAL LOW (ref 150–400)
RBC: 3.72 MIL/uL — ABNORMAL LOW (ref 3.87–5.11)
RDW: 13.2 % (ref 11.5–15.5)
WBC: 4.8 10*3/uL (ref 4.0–10.5)
nRBC: 0 % (ref 0.0–0.2)

## 2019-04-25 LAB — URINE CULTURE: Culture: NO GROWTH

## 2019-04-25 LAB — ECHOCARDIOGRAM COMPLETE
Height: 68 in
Weight: 4467.19 oz

## 2019-04-25 LAB — GLUCOSE, CAPILLARY
Glucose-Capillary: 179 mg/dL — ABNORMAL HIGH (ref 70–99)
Glucose-Capillary: 195 mg/dL — ABNORMAL HIGH (ref 70–99)

## 2019-04-25 MED ORDER — POTASSIUM CHLORIDE CRYS ER 20 MEQ PO TBCR
40.0000 meq | EXTENDED_RELEASE_TABLET | Freq: Once | ORAL | Status: AC
  Administered 2019-04-25: 40 meq via ORAL
  Filled 2019-04-25: qty 2

## 2019-04-25 MED ORDER — ATORVASTATIN CALCIUM 20 MG PO TABS: 40.0000 mg | ORAL_TABLET | Freq: Every day | ORAL | Status: DC

## 2019-04-25 MED ORDER — LOSARTAN POTASSIUM 50 MG PO TABS
100.0000 mg | ORAL_TABLET | Freq: Every day | ORAL | Status: DC
  Administered 2019-04-25: 100 mg via ORAL
  Filled 2019-04-25: qty 2

## 2019-04-25 MED ORDER — CEFDINIR 300 MG PO CAPS: 300.0000 mg | ORAL_CAPSULE | Freq: Two times a day (BID) | ORAL | 0 refills | Status: AC

## 2019-04-25 MED ORDER — CEFAZOLIN SODIUM-DEXTROSE 2-4 GM/100ML-% IV SOLN
2.0000 g | Freq: Three times a day (TID) | INTRAVENOUS | Status: DC
  Filled 2019-04-25 (×2): qty 100

## 2019-04-25 MED ORDER — ALLOPURINOL 100 MG PO TABS
100.0000 mg | ORAL_TABLET | Freq: Two times a day (BID) | ORAL | Status: DC
  Administered 2019-04-25: 100 mg via ORAL
  Filled 2019-04-25: qty 1

## 2019-04-25 NOTE — Progress Notes (Signed)
*  PRELIMINARY RESULTS* Echocardiogram 2D Echocardiogram has been performed.  Sherrie Sport 04/25/2019, 9:38 AM

## 2019-04-25 NOTE — Progress Notes (Signed)
OT Cancellation Note  Patient Details Name: Tina Short MRN: [id number] DOB: February 09, 1956   Cancelled Treatment:    Reason Eval/Treat Not Completed: Patient at procedure or test/ unavailable  OT consult received and chart reviewed. When OT presents for evaluation this AM, pt about to receive Echo in room. Will f/u for OT evaluation as able/as pt becomes available. Thank you.  Gerrianne Scale, [place], OTR/L ascom [phone] 04/25/19, 9:05 AM

## 2019-04-25 NOTE — Consult Note (Signed)
Pharmacy Antibiotic Note  Tina Short is a 64 y.o. female admitted on 04/21/2019 with cellulitis (and possible bacteremia).    Pharmacy has been consulted for Cefazolin dosing.  Plan: Cefazolin 2g q 8h  Height: 5\' 8"  (172.7 cm) Weight: 279 lb 3.2 oz (126.6 kg) IBW/kg (Calculated) : 63.9  Temp (24hrs), Avg:97.8 F (36.6 C), Min:97.4 F (36.3 C), Max:98.2 F (36.8 C)  Recent Labs  Lab 04/21/19 1219 04/21/19 1421 04/22/19 0453 04/22/19 0453 04/22/19 0854 04/22/19 1549 04/23/19 0504 04/23/19 0852 04/23/19 1141 04/24/19 0824 04/25/19 0545  WBC 7.9  --  7.3  --   --   --  4.5  --   --  4.3 4.8  CREATININE 0.68  --  0.58  --   --   --  0.42*  --   --  0.41* 0.44  LATICACIDVEN 3.3*   < > 2.2*   < > 2.3* 3.5*  --  2.1* 2.4* 1.7  --    < > = values in this interval not displayed.    Estimated Creatinine Clearance: 99.8 mL/min (by C-G formula based on SCr of 0.44 mg/dL).    Allergies  Allergen Reactions  . Abilify [Aripiprazole] Shortness Of Breath    Other reaction(s): Difficulty breathing  . Invokana [Canagliflozin] Itching    Yeast infections  . Metformin And Related     Diarrhea   . Lovaza [Omega-3-Acid Ethyl Esters] Other (See Comments)    Burping     Antimicrobials this admission: Cefepime 3/11>3/15 Cefazolin 3/15 >>  Dose adjustments this admission: None  Microbiology results: 3/11 BCx: *1/4 bottles Coag Neg Staph, Diphtheroids 3/14 BCx: NGTD  Thank you for allowing pharmacy to be a part of this patient's care.  Lu Duffel, PharmD, BCPS Clinical Pharmacist 04/25/2019 8:58 AM

## 2019-04-25 NOTE — Discharge Summary (Signed)
Physician Discharge Summary  Tina Short CWC:[id number] DOB: [birth year] DOA: 04/21/2019  PCP: Steele Sizer, MD  Admit date: 04/21/2019 Discharge date: 04/25/2019  Admitted From: home Disposition:  home  Recommendations for Outpatient Follow-up:  1. Follow up with PCP in 1-2 weeks   Home Health: no Equipment/Devices: walker but pt refused one   Discharge Condition: stable CODE STATUS: full  Diet recommendation: Heart Healthy / Carb Modified  Brief/Interim Summary: 64 y/o F w/ PMH of morbid obesity, HLD, depression, OSA on CPAP intermittently who presents who LLE pain x swelling x 4 days. The pain is sharp, intermittent, w/o radiation. Tylenol makes the pain better and nothing makes the pain worse. The severity is currently 0/10. Pt also c/o subjective fevers and chills. Pt denies any dizziness/lightheadedness, sweating, chest pain, shortness of breath, nausea, vomiting, abd pain, diarrhea or constipation. Of note, pt c/o dysuria, urinary frequency & urgency x 3 days. Pt has hx of UTIs.    Pt was treated w/ IV cefepime for LLE cellulitis. Pt was also found to have bacteremia w/ 1 of 2 cx growing corynebacterium, sens were not available as per micro lab. Echo was done which did not show any endocarditis. Repeat blood cxs were neg x 24 hours at the time of d/c. Pt was transition to po cefdinir at d/c to finish the course at home. Of note, PT saw the pt and recommended outpatient PT and a walker but pt refused.   Discharge Diagnoses:  Active Problems:   Cellulitis  LLE cellulitis: improving. Will continue on IV cefepime. Non-toxic appearing, no fevers so far. Wound care recs apprec. [place] of LLE neg for DVT  Bacteremia: Blood cx 1 of 2 growing corynebacterium, sens not available as per micro lab. Continue on IV cefepime. Repeat blood cxs ordered. Echo ordered  Lactic acidosis: resolved  Thrombocytopenia: etiology unclear. Will continue to monitor   Morbid obesity: BMI 42.4. Would  benefit greatly from weight loss  DM2: uncontrolled, HbA1c 8.1. Will hold home dose of ozempic. Will continue on SSI w/ accuchecks. Carb modified diet   Hypokalemia: KCl repleated. Will continue to monitor   Hyperbilirubinemia: resolved  Likely UTI: UA is positive, pt had GU symptoms & urine cx shows no growth as expected as urine cx was not collected until 04/23/19 despite being ordered on 04/21/19.  OSA: continue on CPAP at night  Depression: severity unknown. Will continue on home dose of venlafaxine   HLD: will continue on statin, fenofibrate   Gout: will continue on home dose of allopurinol  Discharge Instructions  Discharge Instructions    Diet - low sodium heart healthy   Complete by: As directed    Diet Carb Modified   Complete by: As directed    Discharge instructions   Complete by: As directed    F/u PCP in 1-2 weeks   Increase activity slowly   Complete by: As directed      Allergies as of 04/25/2019      Reactions   Abilify [aripiprazole] Shortness Of Breath   Other reaction(s): Difficulty breathing   Invokana [canagliflozin] Itching   Yeast infections   Metformin And Related    Diarrhea   Lovaza [omega-3-acid Ethyl Esters] Other (See Comments)   Burping       Medication List    TAKE these medications   acetaminophen 500 MG tablet Commonly known as: TYLENOL Take 1,000 mg by mouth every 6 (six) hours as needed.   allopurinol 100 MG tablet Commonly known as:  ZYLOPRIM Take 1 tablet (100 mg total) by mouth 2 (two) times daily.   atorvastatin 40 MG tablet Commonly known as: LIPITOR Take 1 tablet (40 mg total) by mouth at bedtime.   blood glucose meter kit and supplies Dispense based on patient and insurance preference. Use up to four times daily as directed. (FOR ICD-10 E10.9, E11.9).   cefdinir 300 MG capsule Commonly known as: OMNICEF Take 1 capsule (300 mg total) by mouth 2 (two) times daily for 5 days.   clotrimazole 1 % cream Commonly  known as: LOTRIMIN Apply 1 application topically 2 (two) times daily.   fenofibrate 145 MG tablet Commonly known as: Tricor Take 1 tablet (145 mg total) by mouth daily.   furosemide 40 MG tablet Commonly known as: LASIX Daily What changed:   how much to take  how to take this  when to take this  reasons to take this  additional instructions   hydrocortisone cream 0.5 % Apply 1 application topically 2 (two) times daily.   losartan 100 MG tablet Commonly known as: COZAAR Take 1 tablet (100 mg total) by mouth daily.   multivitamin with minerals Tabs tablet Take 1 tablet by mouth daily.   OneTouch Ultra test strip Generic drug: glucose blood TEST UP TO FOUR TIMES A DAY AS DIRECTED   Ozempic (0.25 or 0.5 MG/DOSE) 2 MG/1.5ML Sopn Generic drug: Semaglutide(0.25 or 0.'5MG'$ /DOS) Inject 0.5 mg into the skin once a week.   potassium chloride SA 20 MEQ tablet Commonly known as: KLOR-CON TAKE 1 TABLET(20 MEQ) BY MOUTH DAILY What changed:   how much to take  how to take this  when to take this  reasons to take this  additional instructions   venlafaxine XR 150 MG 24 hr capsule Commonly known as: EFFEXOR-XR Take 1 capsule (150 mg total) by mouth daily with breakfast.   vitamin B-1 250 MG tablet Take 250 mg by mouth daily.   Vitamin D (Ergocalciferol) 1.25 MG (50000 UNIT) Caps capsule Commonly known as: DRISDOL TAKE ONE CAPSULE BY MOUTH EVERY 7 DAYS            Durable Medical Equipment  (From admission, onward)         Start     Ordered   04/25/19 0808  For home use only DME Walker rolling  Once    Question Answer Comment  Walker: With 5 Inch Wheels   Patient needs a walker to treat with the following condition Generalized weakness      04/25/19 0807          Allergies  Allergen Reactions  . Abilify [Aripiprazole] Shortness Of Breath    Other reaction(s): Difficulty breathing  . Invokana [Canagliflozin] Itching    Yeast infections  .  Metformin And Related     Diarrhea   . Lovaza [Omega-3-Acid Ethyl Esters] Other (See Comments)    Burping     Consultations:  none   Procedures/Studies: US Venous Img Lower Unilateral Left (DVT)  Result Date: 04/21/2019 CLINICAL DATA:  Acute left leg pain and edema for 1 day EXAM: LEFT LOWER EXTREMITY VENOUS DOPPLER ULTRASOUND TECHNIQUE: Gray-scale sonography with graded compression, as well as color Doppler and duplex ultrasound were performed to evaluate the lower extremity deep venous systems from the level of the common femoral vein and including the common femoral, femoral, profunda femoral, popliteal and calf veins including the posterior tibial, peroneal and gastrocnemius veins when visible. The superficial great saphenous vein was also interrogated. Spectral Doppler was  utilized to evaluate flow at rest and with distal augmentation maneuvers in the common femoral, femoral and popliteal veins. COMPARISON:  None. FINDINGS: Contralateral Common Femoral Vein: Respiratory phasicity is normal and symmetric with the symptomatic side. No evidence of thrombus. Normal compressibility. Common Femoral Vein: No evidence of thrombus. Normal compressibility, respiratory phasicity and response to augmentation. Saphenofemoral Junction: No evidence of thrombus. Normal compressibility and flow on color Doppler imaging. Profunda Femoral Vein: No evidence of thrombus. Normal compressibility and flow on color Doppler imaging. Femoral Vein: No evidence of thrombus. Normal compressibility, respiratory phasicity and response to augmentation. Popliteal Vein: No evidence of thrombus. Normal compressibility, respiratory phasicity and response to augmentation. Calf Veins: Limited assessment of the calf veins without gross occlusive thrombus appreciated. IMPRESSION: No significant left lower extremity DVT. Limited assessment of the calf veins. Electronically Signed   By: Jerilynn Mages.  Shick M.D.   On: 04/21/2019 16:37    ECHOCARDIOGRAM COMPLETE  Result Date: 04/25/2019    ECHOCARDIOGRAM REPORT   Patient Name:   Tina Short Date of Exam: 04/25/2019 Medical Rec #:  [id number]       Height:       68.0 in Accession #:    [id number]      Weight:       279.2 lb Date of Birth:  10/23/1955       BSA:          2.355 m Patient Age:    64 years        BP:           139/77 mmHg Patient Gender: F               HR:           67 bpm. Exam Location:  [facility] Procedure: 2D Echo, Cardiac Doppler and Color Doppler Indications:     Bacteremia 790.7  History:         Patient has prior history of Echocardiogram examinations, most                  recent 08/13/2016. Risk Factors:Hypertension and Diabetes.  Sonographer:     Sherrie Sport RDCS (AE) Referring Phys:  [id number] Wyvonnia Dusky Diagnosing Phys: Neoma Laming MD IMPRESSIONS  1. Left ventricular ejection fraction, by estimation, is 50 to 55%. The left ventricle has low normal function. The left ventricle has no regional wall motion abnormalities. Left ventricular diastolic parameters are consistent with Grade I diastolic dysfunction (impaired relaxation).  2. Right ventricular systolic function is normal. The right ventricular size is normal. There is normal pulmonary artery systolic pressure.  3. The mitral valve is normal in structure. No evidence of mitral valve regurgitation. No evidence of mitral stenosis.  4. The aortic valve is normal in structure. Aortic valve regurgitation is not visualized. No aortic stenosis is present.  5. The inferior vena cava is normal in size with greater than 50% respiratory variability, suggesting right atrial pressure of 3 mmHg. FINDINGS  Left Ventricle: Left ventricular ejection fraction, by estimation, is 50 to 55%. The left ventricle has low normal function. The left ventricle has no regional wall motion abnormalities. The left ventricular internal cavity size was normal in size. There is no left ventricular hypertrophy. Left ventricular diastolic parameters  are consistent with Grade I diastolic dysfunction (impaired relaxation). Right Ventricle: The right ventricular size is normal. No increase in right ventricular wall thickness. Right ventricular systolic function is normal. There is normal pulmonary artery systolic pressure. The tricuspid  regurgitant velocity is 2.20 m/s, and  with an assumed right atrial pressure of 10 mmHg, the estimated right ventricular systolic pressure is 34.9 mmHg. Left Atrium: Left atrial size was normal in size. Right Atrium: Right atrial size was normal in size. Pericardium: There is no evidence of pericardial effusion. Mitral Valve: The mitral valve is normal in structure. Normal mobility of the mitral valve leaflets. No evidence of mitral valve regurgitation. No evidence of mitral valve stenosis. Tricuspid Valve: The tricuspid valve is normal in structure. Tricuspid valve regurgitation is not demonstrated. No evidence of tricuspid stenosis. Aortic Valve: The aortic valve is normal in structure. Aortic valve regurgitation is not visualized. No aortic stenosis is present. Aortic valve mean gradient measures 4.5 mmHg. Aortic valve peak gradient measures 7.0 mmHg. Aortic valve area, by VTI measures 2.67 cm. Pulmonic Valve: The pulmonic valve was normal in structure. Pulmonic valve regurgitation is not visualized. No evidence of pulmonic stenosis. Aorta: The aortic root is normal in size and structure. Venous: The inferior vena cava is normal in size with greater than 50% respiratory variability, suggesting right atrial pressure of 3 mmHg. IAS/Shunts: No atrial level shunt detected by color flow Doppler.  LEFT VENTRICLE PLAX 2D LVIDd:         4.70 cm  Diastology LVIDs:         3.61 cm  LV e' lateral:   8.05 cm/s LV PW:         1.25 cm  LV E/e' lateral: 11.3 LV IVS:        1.01 cm  LV e' medial:    5.77 cm/s LVOT diam:     2.10 cm  LV E/e' medial:  15.7 LV SV:         73 LV SV Index:   31 LVOT Area:     3.46 cm  RIGHT VENTRICLE RV Basal  diam:  3.47 cm RV S prime:     17.30 cm/s TAPSE (M-mode): 4.3 cm LEFT ATRIUM            Index       RIGHT ATRIUM           Index LA diam:      2.70 cm  1.15 cm/m  RA Area:     20.10 cm LA Vol (A4C): 144.0 ml 61.14 ml/m RA Volume:   58.20 ml  24.71 ml/m  AORTIC VALVE                   PULMONIC VALVE AV Area (Vmax):    2.29 cm    PV Vmax:        0.64 m/s AV Area (Vmean):   2.38 cm    PV Peak grad:   1.7 mmHg AV Area (VTI):     2.67 cm    RVOT Peak grad: 2 mmHg AV Vmax:           132.00 cm/s AV Vmean:          99.300 cm/s AV VTI:            0.275 m AV Peak Grad:      7.0 mmHg AV Mean Grad:      4.5 mmHg LVOT Vmax:         87.20 cm/s LVOT Vmean:        68.300 cm/s LVOT VTI:          0.212 m LVOT/AV VTI ratio: 0.77  AORTA Ao Root diam: 3.45 cm MITRAL VALVE  TRICUSPID VALVE MV Area (PHT): 3.48 cm    TR Peak grad:   19.4 mmHg MV Decel Time: 218 msec    TR Vmax:        220.00 cm/s MV E velocity: 90.70 cm/s MV A velocity: 94.60 cm/s  SHUNTS MV E/A ratio:  0.96        Systemic VTI:  0.21 m                            Systemic Diam: 2.10 cm Neoma Laming MD Electronically signed by Neoma Laming MD Signature Date/Time: 04/25/2019/11:59:20 AM    Final      Subjective: Pt c/o fatigue   Discharge Exam: Vitals:   04/25/19 0558 04/25/19 1203  BP: 139/77 (!) 157/84  Pulse: 67 74  Resp:  18  Temp: 98.2 F (36.8 C) 97.7 F (36.5 C)  SpO2: 98% 100%   Vitals:   04/24/19 0457 04/24/19 1306 04/25/19 0558 04/25/19 1203  BP: 140/71 (!) 157/70 139/77 (!) 157/84  Pulse: 71 85 67 74  Resp: _0 Temp: 97.8 F (36.6 C) (!) 97.4 F (36.3 C) 98.2 F (36.8 C) 97.7 F (36.5 C)  TempSrc: Oral Oral Oral Oral  SpO2: 95% 96% 98% 100%  Weight:      Height:        General: Pt is alert, awake, not in acute distress Cardiovascular: S1/S2 +, no rubs, no gallops Respiratory: diminished breath sounds b/l otherwise clear Abdominal: Soft, NT, obese, bowel sounds + Extremities: b/l LE edema, no  cyanosis    The results of significant diagnostics from this hospitalization (including imaging, microbiology, ancillary and laboratory) are listed below for reference.     Microbiology: Recent Results (from the past 240 hour(s))  Blood Culture (routine x 2)     Status: Abnormal   Collection Time: 04/21/19  3:23 PM   Specimen: BLOOD  Result Value Ref Range Status   Specimen Description   Final    BLOOD LEFT ANTECUBITAL Performed at [facility], [address] [place], [place] [address]    Special Requests   Final    BOTTLES DRAWN AEROBIC AND ANAEROBIC Blood Culture results may not be optimal due to an excessive volume of blood received in culture bottles Performed at [facility], [address] [place], [place] [address]    Culture  Setup Time   Final    GRAM POSITIVE RODS AEROBIC BOTTLE ONLY CRITICAL RESULT CALLED TO, READ BACK BY AND VERIFIED WITH: ABBY ELLINGTON ON 04/22/19 AT 1223 QSD Performed at [facility], [address] [place], [place] [address]    Culture (A)  Final    DIPHTHEROIDS(CORYNEBACTERIUM SPECIES) Standardized susceptibility testing for this organism is not available. STAPHYLOCOCCUS SPECIES (COAGULASE NEGATIVE) THE SIGNIFICANCE OF ISOLATING THIS ORGANISM FROM A SINGLE SET OF BLOOD CULTURES WHEN MULTIPLE SETS ARE DRAWN IS UNCERTAIN. PLEASE NOTIFY THE MICROBIOLOGY DEPARTMENT WITHIN ONE WEEK IF SPECIATION AND SENSITIVITIES ARE REQUIRED. Performed at [facility], [place] [address], [place],  [address]    Report Status 04/24/2019 FINAL  Final  Blood Culture (routine x 2)     Status: None (Preliminary result)   Collection Time: 04/21/19  3:23 PM   Specimen: BLOOD  Result Value Ref Range Status   Specimen Description BLOOD RIGHT ANTECUBITAL  Final   Special Requests   Final    BOTTLES DRAWN AEROBIC AND ANAEROBIC Blood Culture results may  not be optimal due to an excessive volume of blood received in culture bottles    Culture   Final    NO GROWTH 4 DAYS Performed at [facility], [address] [place], [place] [address]    Report Status PENDING  Incomplete  SARS CORONAVIRUS 2 (TAT 6-24 HRS) Nasopharyngeal Nasopharyngeal Swab     Status: None   Collection Time: 04/21/19  3:47 PM   Specimen: Nasopharyngeal Swab  Result Value Ref Range Status   SARS Coronavirus 2 NEGATIVE NEGATIVE Final    Comment: (NOTE) SARS-CoV-2 target nucleic acids are NOT DETECTED. The SARS-CoV-2 RNA is generally detectable in upper and lower respiratory specimens during the acute phase of infection. Negative results do not preclude SARS-CoV-2 infection, do not rule out co-infections with other pathogens, and should not be used as the sole basis for treatment or other patient management decisions. Negative results must be combined with clinical observations, patient history, and epidemiological information. The expected result is Negative. Fact Sheet for Patients: SugarRoll.be Fact Sheet for Healthcare Providers: https://www.woods-mathews.com/ This test is not yet approved or cleared by the [place] FDA and  has been authorized for detection and/or diagnosis of SARS-CoV-2 by FDA under an Emergency Use Authorization (EUA). This EUA will remain  in effect (meaning this test can be used) for the duration of the COVID-19 declaration under Section 56 4(b)(1) of the Act, 21 U.S.C. section 360bbb-3(b)(1), unless the authorization is terminated or revoked sooner. Performed at [facility], [place] [address], [place], [place] [address]   Urine Culture     Status: None   Collection Time: 04/23/19  3:23 PM   Specimen: Urine, Random  Result Value Ref Range Status   Specimen Description   Final    URINE, RANDOM Performed at [facility], [address] [place], [place] [address]    Special Requests   Final    NONE Performed at [facility], [address] [place], [place] [address]    Culture   Final    NO GROWTH Performed at [facility], [place] [address], [place], [place] [address]    Report Status 04/25/2019 FINAL  Final  CULTURE, BLOOD (ROUTINE X 2) w Reflex to ID Panel     Status: None (Preliminary result)   Collection Time: 04/24/19 11:40 AM   Specimen: BLOOD  Result Value Ref Range Status   Specimen Description BLOOD FOREARM  Final   Special Requests   Final    BOTTLES DRAWN AEROBIC AND ANAEROBIC Blood Culture adequate volume   Culture   Final    NO GROWTH < 24 HOURS Performed at [facility], [address], [place], [place] [address]    Report Status PENDING  Incomplete  CULTURE, BLOOD (ROUTINE X 2) w Reflex to ID Panel     Status: None (Preliminary result)   Collection Time: 04/24/19 11:40 AM   Specimen: BLOOD  Result Value Ref Range Status   Specimen Description BLOOD BLOOD LEFT HAND  Final   Special Requests   Final    BOTTLES DRAWN AEROBIC AND ANAEROBIC Blood Culture results may not be optimal due to an inadequate volume of blood received in culture bottles   Culture   Final    NO GROWTH < 24 HOURS Performed at [facility], [address] [place],  [address]    Report Status PENDING  Incomplete     Labs: BNP (last 3 results) No results for input(s):  BNP in the last 8760 hours. Basic Metabolic Panel: Recent Labs  Lab 04/21/19 1219 04/22/19 0453 04/23/19 0504 04/24/19 0824 04/25/19 0545  NA 135 137 138 140 142  K 3.6 3.4* 3.2* 3.9 3.4*  CL 97* 102 106 110 110  CO2 26 23 21* 21* 22  GLUCOSE 300* 241* 177* 188* 176*  BUN _0 CREATININE 0.68 0.58 0.42* 0.41* 0.44  CALCIUM 8.9 8.6* 8.1* 8.0* 8.2*   Liver Function Tests: Recent Labs  Lab 04/21/19 1219 04/22/19 0453  AST 38 37  ALT 32 29  ALKPHOS 55 55  BILITOT 1.5* 1.1  PROT 8.3* 7.6  ALBUMIN 3.7 3.5   No results for input(s): LIPASE, AMYLASE in the last 168 hours. No results for input(s):  AMMONIA in the last 168 hours. CBC: Recent Labs  Lab 04/21/19 1219 04/22/19 0453 04/23/19 0504 04/24/19 0824 04/25/19 0545  WBC 7.9 7.3 4.5 4.3 4.8  NEUTROABS 5.9  --   --   --   --   HGB 15.9* 14.5 12.9 13.1 12.2  HCT 46.0 42.4 37.7 38.2 34.9*  MCV 93.3 92.6 92.4 94.3 93.8  PLT 158 150 147* 146* 144*   Cardiac Enzymes: No results for input(s): CKTOTAL, CKMB, CKMBINDEX, TROPONINI in the last 168 hours. BNP: Invalid input(s): POCBNP CBG: Recent Labs  Lab 04/24/19 1216 04/24/19 1645 04/24/19 2158 04/25/19 0750 04/25/19 1201  GLUCAP 209* 147* 185* 179* 195*   D-Dimer No results for input(s): DDIMER in the last 72 hours. Hgb A1c No results for input(s): HGBA1C in the last 72 hours. Lipid Profile No results for input(s): CHOL, HDL, LDLCALC, TRIG, CHOLHDL, LDLDIRECT in the last 72 hours. Thyroid function studies No results for input(s): TSH, T4TOTAL, T3FREE, THYROIDAB in the last 72 hours.  Invalid input(s): FREET3 Anemia work up No results for input(s): VITAMINB12, FOLATE, FERRITIN, TIBC, IRON, RETICCTPCT in the last 72 hours. Urinalysis    Component Value Date/Time   COLORURINE YELLOW (A) 04/23/2019 1523   APPEARANCEUR CLEAR (A) 04/23/2019 1523   LABSPEC 1.018 04/23/2019 1523   PHURINE 5.0 04/23/2019 1523   GLUCOSEU NEGATIVE 04/23/2019 1523   HGBUR NEGATIVE 04/23/2019 1523   BILIRUBINUR NEGATIVE 04/23/2019 1523   BILIRUBINUR NEGATIVE 02/08/2018 1549   KETONESUR NEGATIVE 04/23/2019 1523   PROTEINUR NEGATIVE 04/23/2019 1523   UROBILINOGEN 0.2 02/08/2018 1549   NITRITE NEGATIVE 04/23/2019 1523   LEUKOCYTESUR NEGATIVE 04/23/2019 1523   Sepsis Labs Invalid input(s): PROCALCITONIN,  WBC,  LACTICIDVEN Microbiology Recent Results (from the past 240 hour(s))  Blood Culture (routine x 2)     Status: Abnormal   Collection Time: 04/21/19  3:23 PM   Specimen: BLOOD  Result Value Ref Range Status   Specimen Description   Final    BLOOD LEFT ANTECUBITAL Performed at  [facility], [address] [place], [place] [address]    Special Requests   Final    BOTTLES DRAWN AEROBIC AND ANAEROBIC Blood Culture results may not be optimal due to an excessive volume of blood received in culture bottles Performed at [facility], [address] [place], [place] [address]    Culture  Setup Time   Final    GRAM POSITIVE RODS AEROBIC BOTTLE ONLY CRITICAL RESULT CALLED TO, READ BACK BY AND VERIFIED WITH: ABBY ELLINGTON ON 04/22/19 AT 1223 QSD Performed at [facility], [address], [place],  [address]    Culture (A)  Final    DIPHTHEROIDS(CORYNEBACTERIUM SPECIES) Standardized susceptibility testing for  this organism is not available. STAPHYLOCOCCUS SPECIES (COAGULASE NEGATIVE) THE SIGNIFICANCE OF ISOLATING THIS ORGANISM FROM A SINGLE SET OF BLOOD CULTURES WHEN MULTIPLE SETS ARE DRAWN IS UNCERTAIN. PLEASE NOTIFY THE MICROBIOLOGY DEPARTMENT WITHIN ONE WEEK IF SPECIATION AND SENSITIVITIES ARE REQUIRED. Performed at [facility], [place] [address] [place], [place] [address]    Report Status 04/24/2019 FINAL  Final  Blood Culture (routine x 2)     Status: None (Preliminary result)   Collection Time: 04/21/19  3:23 PM   Specimen: BLOOD  Result Value Ref Range Status   Specimen Description BLOOD RIGHT ANTECUBITAL  Final   Special Requests   Final    BOTTLES DRAWN AEROBIC AND ANAEROBIC Blood Culture results may not be optimal due to an excessive volume of blood received in culture bottles   Culture   Final    NO GROWTH 4 DAYS Performed at [facility], [address] [place], [place] [address]    Report Status PENDING  Incomplete  SARS CORONAVIRUS 2 (TAT 6-24 HRS) Nasopharyngeal Nasopharyngeal Swab     Status: None   Collection Time: 04/21/19  3:47 PM   Specimen: Nasopharyngeal Swab  Result Value Ref Range Status   SARS Coronavirus 2 NEGATIVE NEGATIVE Final    Comment: (NOTE) SARS-CoV-2 target nucleic acids  are NOT DETECTED. The SARS-CoV-2 RNA is generally detectable in upper and lower respiratory specimens during the acute phase of infection. Negative results do not preclude SARS-CoV-2 infection, do not rule out co-infections with other pathogens, and should not be used as the sole basis for treatment or other patient management decisions. Negative results must be combined with clinical observations, patient history, and epidemiological information. The expected result is Negative. Fact Sheet for Patients: SugarRoll.be Fact Sheet for Healthcare Providers: https://www.woods-mathews.com/ This test is not yet approved or cleared by the [place] FDA and  has been authorized for detection and/or diagnosis of SARS-CoV-2 by FDA under an Emergency Use Authorization (EUA). This EUA will remain  in effect (meaning this test can be used) for the duration of the COVID-19 declaration under Section 56 4(b)(1) of the Act, 21 U.S.C. section 360bbb-3(b)(1), unless the authorization is terminated or revoked sooner. Performed at [facility], [place] [address] [place], [place] [address]   Urine Culture     Status: None   Collection Time: 04/23/19  3:23 PM   Specimen: Urine, Random  Result Value Ref Range Status   Specimen Description   Final    URINE, RANDOM Performed at [facility], [address], [place], [place] [address]    Special Requests   Final    NONE Performed at [facility], [address] [place], [place] [address]    Culture   Final    NO GROWTH Performed at [facility], [place] [address] [place], [place] [address]    Report Status 04/25/2019 FINAL  Final  CULTURE, BLOOD (ROUTINE X 2) w Reflex to ID Panel     Status: None (Preliminary result)   Collection Time: 04/24/19 11:40 AM   Specimen: BLOOD  Result Value Ref Range Status   Specimen Description BLOOD FOREARM  Final   Special Requests   Final     BOTTLES DRAWN AEROBIC AND ANAEROBIC Blood Culture adequate volume   Culture   Final    NO GROWTH < 24 HOURS Performed at [facility], [address] [place], [place] [address]    Report Status PENDING  Incomplete  CULTURE, BLOOD (ROUTINE X 2) w Reflex to ID Panel     Status: None (Preliminary result)   Collection Time: 04/24/19 11:40 AM   Specimen: BLOOD  Result Value Ref Range Status   Specimen Description BLOOD BLOOD LEFT HAND  Final   Special Requests   Final    BOTTLES DRAWN AEROBIC AND ANAEROBIC Blood Culture results may not be optimal due to an inadequate volume of blood received in culture bottles   Culture   Final    NO GROWTH < 24 HOURS Performed at [facility], [address] [place], [place] [address]    Report Status PENDING  Incomplete     Time coordinating discharge: Over 30 minutes  SIGNED:   Wyvonnia Dusky, MD  [facility] 04/25/2019, 1:23 PM Pager   If 7PM-7AM, please contact night-coverage [email or web address]

## 2019-04-25 NOTE — Progress Notes (Signed)
Tina Short to be D/C'd home with husband per MD order.  Discussed prescriptions and follow up appointments with the patient. Prescriptions given to patient, medication list explained in detail. Pt verbalized understanding.  Allergies as of 04/25/2019       Reactions   Abilify [aripiprazole] Shortness Of Breath   Other reaction(s): Difficulty breathing   Invokana [canagliflozin] Itching   Yeast infections   Metformin And Related    Diarrhea   Lovaza [omega-3-acid Ethyl Esters] Other (See Comments)   Burping         Medication List     TAKE these medications    acetaminophen 500 MG tablet Commonly known as: TYLENOL Take 1,000 mg by mouth every 6 (six) hours as needed.   allopurinol 100 MG tablet Commonly known as: ZYLOPRIM Take 1 tablet (100 mg total) by mouth 2 (two) times daily.   atorvastatin 40 MG tablet Commonly known as: LIPITOR Take 1 tablet (40 mg total) by mouth at bedtime.   blood glucose meter kit and supplies Dispense based on patient and insurance preference. Use up to four times daily as directed. (FOR ICD-10 E10.9, E11.9).   cefdinir 300 MG capsule Commonly known as: OMNICEF Take 1 capsule (300 mg total) by mouth 2 (two) times daily for 5 days.   clotrimazole 1 % cream Commonly known as: LOTRIMIN Apply 1 application topically 2 (two) times daily.   fenofibrate 145 MG tablet Commonly known as: Tricor Take 1 tablet (145 mg total) by mouth daily.   furosemide 40 MG tablet Commonly known as: LASIX Daily What changed:  how much to take how to take this when to take this reasons to take this additional instructions   hydrocortisone cream 0.5 % Apply 1 application topically 2 (two) times daily.   losartan 100 MG tablet Commonly known as: COZAAR Take 1 tablet (100 mg total) by mouth daily.   multivitamin with minerals Tabs tablet Take 1 tablet by mouth daily.   OneTouch Ultra test strip Generic drug: glucose blood TEST UP TO FOUR TIMES A  DAY AS DIRECTED   Ozempic (0.25 or 0.5 MG/DOSE) 2 MG/1.5ML Sopn Generic drug: Semaglutide(0.25 or 0.5MG/DOS) Inject 0.5 mg into the skin once a week.   potassium chloride SA 20 MEQ tablet Commonly known as: KLOR-CON TAKE 1 TABLET(20 MEQ) BY MOUTH DAILY What changed:  how much to take how to take this when to take this reasons to take this additional instructions   venlafaxine XR 150 MG 24 hr capsule Commonly known as: EFFEXOR-XR Take 1 capsule (150 mg total) by mouth daily with breakfast.   vitamin B-1 250 MG tablet Take 250 mg by mouth daily.   Vitamin D (Ergocalciferol) 1.25 MG (50000 UNIT) Caps capsule Commonly known as: DRISDOL TAKE ONE CAPSULE BY MOUTH EVERY 7 DAYS               Durable Medical Equipment  (From admission, onward)           Start     Ordered   04/25/19 0808  For home use only DME Walker rolling  Once    Question Answer Comment  Walker: With 5 Inch Wheels   Patient needs a walker to treat with the following condition Generalized weakness      04/25/19 0807            Vitals:   04/25/19 0558 04/25/19 1203  BP: 139/77 (!) 157/84  Pulse: 67 74  Resp:  18  Temp: 98.2 F (  36.8 C) 97.7 F (36.5 C)  SpO2: 98% 100%    Skin clean, dry and intact without evidence of skin break down, no evidence of skin tears noted. IV catheter discontinued intact. Site without signs and symptoms of complications. Dressing and pressure applied. Pt denies pain at this time. No complaints noted.  An After Visit Summary was printed and given to the patient. Patient escorted via [place], and D/C home via private auto.  [place] A Tina Short

## 2019-04-25 NOTE — Care Management (Signed)
PT has assessed patient and recommends RW and outpatient PT at discharge.   RNCM discussed recommendations with patient.  She declines RW at this time.  I have informed patient that should she change her mind to notify her nurse and I will get one prior to discharge

## 2019-04-25 NOTE — Evaluation (Signed)
Occupational Therapy Evaluation Patient Details Name: Tina Short MRN: [id number] DOB: 28-Feb-1955 Today's Date: 04/25/2019    History of Present Illness Tina Short is a 64 y.o. female with a history of diabetes, hypertension, sepsis, recurrent left leg infections presents with concern for left leg cellulitis.  Patient reports 2 days ago she began having fevers, chills, fatigue.  She knew this indicated that she was developing another infection in her leg and sure enough her leg became erythematous this morning.  She called her PCP who told her to come to the hospital.  She continues to feel fatigued and has chills.  She does have a history of diabetes.  Has not take anything for this. PLOF ind with all ADLs, no AD, ind with community errands and distances, and recently retired   Clinical Impression   Pt was seen for OT evaluation this date. Prior to hospital admission, pt was Indep with ADLs/IADLs. Pt lives with spouse in multi level home with ramped entrance and 2 steps to negotiate inside. Currently pt demonstrates very minimal decreased standing balance/tolerance related to decreased clearance of L LE which appears to be d/t edema. Pt close to fxl baseline with ADL/self-care tasks per her self report. OT initially provides Supv for fxl mobility/ADL transfers with no AD, but pt demos good safety awareness and pacing appropriate to complete at MOD I to Indep level.  Pt does not appear to have any further acute OT needs at this time and do not anticipate need for f/u upon hospital d/c. Thank you.    Follow Up Recommendations  No OT follow up    Equipment Recommendations  None recommended by OT    Recommendations for Other Services       Precautions / Restrictions Restrictions Weight Bearing Restrictions: No      Mobility Bed Mobility Overal bed mobility: Independent                Transfers     Transfers: Sit to/from Stand Sit to Stand: Independent              Balance Overall balance assessment: Needs assistance Sitting-balance support: No upper extremity supported;Feet supported Sitting balance-Leahy Scale: Normal     Standing balance support: During functional activity Standing balance-Leahy Scale: Good                             ADL either performed or assessed with clinical judgement   ADL                                         General ADL Comments: Pt demos modified technique for donning socks in sitting with external hip rotation which she states she's done for years. Pt compeltes all seated ADLs with Indep/MOD I, completes standing ADLs/ADL transfers with no AD at supv to MOD I level (use of grab bar in restroom).     Vision Patient Visual Report: No change from baseline       Perception     Praxis      Pertinent Vitals/Pain Pain Assessment: No/denies pain(some vague discomfort of swollen L LE, but reports not painful)     Hand Dominance Right   Extremity/Trunk Assessment Upper Extremity Assessment Upper Extremity Assessment: Overall WFL for tasks assessed   Lower Extremity Assessment Lower Extremity Assessment: Overall WFL for tasks assessed  Cervical / Trunk Assessment Cervical / Trunk Assessment: Normal   Communication Communication Communication: No difficulties   Cognition Arousal/Alertness: Awake/alert Behavior During Therapy: WFL for tasks assessed/performed Overall Cognitive Status: Within Functional Limits for tasks assessed                                     General Comments       Exercises Other Exercises Other Exercises: OT facilitates education re: role of OT and pt verbalized good understanding.   Shoulder Instructions      Home Living Family/patient expects to be discharged to:: Private residence Living Arrangements: Spouse/significant other Available Help at Discharge: Family Type of Home: House Home Access: Ramped entrance   Entrance  Stairs-Rails: None Home Layout: Multi-level Alternate Level Stairs-Number of Steps: 2 Alternate Level Stairs-Rails: Left Bathroom Shower/Tub: Occupational psychologist: Handicapped height     Home Equipment: Grab bars - tub/shower   Additional Comments: Reports the person that lived in the home before her made it more adaptable      Prior Functioning/Environment Level of Independence: Independent        Comments: Just retired, no falls. Reports being completely indep with ADLs/IADLs including yard work.        OT Problem List: Decreased activity tolerance;Obesity;Increased edema      OT Treatment/Interventions: Self-care/ADL training;Therapeutic exercise;Therapeutic activities    OT Goals(Current goals can be found in the care plan section) Acute Rehab OT Goals Patient Stated Goal: go home OT Goal Formulation: All assessment and education complete, DC therapy  OT Frequency:     Barriers to D/C:            Co-evaluation              AM-PAC OT "6 Clicks" Daily Activity     Outcome Measure Help from another person eating meals?: None Help from another person taking care of personal grooming?: None Help from another person toileting, which includes using toliet, bedpan, or urinal?: None Help from another person bathing (including washing, rinsing, drying)?: None Help from another person to put on and taking off regular upper body clothing?: None Help from another person to put on and taking off regular lower body clothing?: None 6 Click Score: 24   End of Session Equipment Utilized During Treatment: Gait belt  Activity Tolerance: Patient tolerated treatment well Patient left: in bed;with call bell/phone within reach  OT Visit Diagnosis: Other abnormalities of gait and mobility (R26.89)                Time: [id number] OT Time Calculation (min): 23 min Charges:  OT General Charges $OT Visit: 1 Visit OT Evaluation $OT Eval Low Complexity: 1 Low OT  Treatments $Self Care/Home Management : 8-22 mins  Gerrianne Scale, MS, OTR/L ascom [phone] 04/25/19, 12:48 PM

## 2019-04-26 ENCOUNTER — Telehealth: Payer: Self-pay

## 2019-04-26 LAB — CULTURE, BLOOD (ROUTINE X 2): Culture: NO GROWTH

## 2019-04-26 NOTE — Telephone Encounter (Signed)
Transition Care Management Follow-up Telephone Call  Date of discharge and from where: 04/25/19 [facility]  How have you been since you were released from the hospital? Pt states she is doing okay, denies pain but still c/o swelling in LLE.   Any questions or concerns? No   Items Reviewed:  Did the pt receive and understand the discharge instructions provided? Yes   Medications obtained and verified? Yes   Any new allergies since your discharge? No   Dietary orders reviewed? Yes  Do you have support at home? Yes   Functional Questionnaire: (I = Independent and D = Dependent) ADLs: I  Bathing/Dressing- I  Meal Prep- I  Eating- I  Maintaining continence- I  Transferring/Ambulation- I  Managing Meds- I  Follow up appointments reviewed:   PCP Hospital f/u appt confirmed? Yes  Scheduled to see Dr. Ancil Boozer on 05/02/19 @ 3:00.  Are transportation arrangements needed? No   If their condition worsens, is the pt aware to call PCP or go to the Emergency Dept.? Yes  Was the patient provided with contact information for the PCP's office or ED? Yes  Was to pt encouraged to call back with questions or concerns? Yes

## 2019-04-29 LAB — CULTURE, BLOOD (ROUTINE X 2)
Culture: NO GROWTH
Culture: NO GROWTH
Special Requests: ADEQUATE

## 2019-05-02 ENCOUNTER — Other Ambulatory Visit: Payer: Self-pay

## 2019-05-02 ENCOUNTER — Ambulatory Visit (INDEPENDENT_AMBULATORY_CARE_PROVIDER_SITE_OTHER): Payer: Managed Care, Other (non HMO) | Admitting: Family Medicine

## 2019-05-02 ENCOUNTER — Encounter: Payer: Self-pay | Admitting: Family Medicine

## 2019-05-02 VITALS — BP 130/80 | HR 103 | Temp 97.1°F | Ht 68.0 in | Wt 282.1 lb

## 2019-05-02 DIAGNOSIS — B379 Candidiasis, unspecified: Secondary | ICD-10-CM

## 2019-05-02 DIAGNOSIS — R600 Localized edema: Secondary | ICD-10-CM

## 2019-05-02 DIAGNOSIS — E1140 Type 2 diabetes mellitus with diabetic neuropathy, unspecified: Secondary | ICD-10-CM

## 2019-05-02 DIAGNOSIS — IMO0002 Reserved for concepts with insufficient information to code with codable children: Secondary | ICD-10-CM

## 2019-05-02 DIAGNOSIS — T3695XA Adverse effect of unspecified systemic antibiotic, initial encounter: Secondary | ICD-10-CM

## 2019-05-02 DIAGNOSIS — E1165 Type 2 diabetes mellitus with hyperglycemia: Secondary | ICD-10-CM

## 2019-05-02 DIAGNOSIS — L03116 Cellulitis of left lower limb: Secondary | ICD-10-CM | POA: Diagnosis not present

## 2019-05-02 DIAGNOSIS — D696 Thrombocytopenia, unspecified: Secondary | ICD-10-CM

## 2019-05-02 MED ORDER — FLUCONAZOLE 150 MG PO TABS: 150.0000 mg | ORAL_TABLET | ORAL | 0 refills | Status: DC

## 2019-05-02 NOTE — Progress Notes (Signed)
Name: Tina Short   MRN: [id number]    DOB: [birth year]   Date:05/02/2019       Progress Note  Subjective  Chief Complaint  Chief Complaint  Patient presents with  . Hospitalization Follow-up    HPI  Tina Short came in to our office on 04/21/2019 with complaints of leg swelling, pain, redness , fever and malaise, she was also having dysuria and urinary frequency. Dr. Roxan Hockey advised her to go to [facility] for possible admission. She was found to have corynebacterium bacteriemia in 1 of 2 cultures, echo was negative for endocarditis. Urine culture was negative.  She was treated with Cefepime for LLE cellulitis and transitioned to Cefdinir upon discharge which she has already finished. She states she feels much better. Urinary symptoms resolved, she has been afebrile, appetite is normal, no nausea, vomiting. She still has mild erythema and slight edema on LLE.  Her platelets were low and we will recheck it today , she denies any bleeding  Patient Active Problem List   Diagnosis Date Noted  . Cellulitis 04/21/2019  . Esophageal dysphagia   . Sepsis ([place]) 08/10/2016  . Colon cancer screening 04/30/2016  . Major depression, recurrent, chronic ([place]) 10/30/2015  . Dyslipidemia associated with type 2 diabetes mellitus ([place]) 10/30/2015  . Vitamin D deficiency 07/27/2015  . Vitamin B12 deficiency 07/27/2015  . History of anemia 09/12/2014  . Carpal tunnel syndrome 09/12/2014  . Obstructive sleep apnea 09/12/2014  . Edema of extremities 09/12/2014  . Gout 09/12/2014  . Morbid obesity, unspecified obesity type ([place]) 11/15/2009  . Benign essential HTN 10/07/2006  . Type 2 diabetes mellitus, uncontrolled, with neuropathy ([place]) 10/07/2006  . Hyperlipidemia 10/07/2006    Past Surgical History:  Procedure Laterality Date  . ABDOMINAL HYSTERECTOMY    . COLONOSCOPY    . COLONOSCOPY WITH PROPOFOL N/A 05/14/2016   Procedure: COLONOSCOPY WITH PROPOFOL;  Surgeon: [name], MD;  Location: [facility]  ENDOSCOPY;  Service: Endoscopy;  Laterality: N/A;  . COLONOSCOPY WITH PROPOFOL N/A 04/15/2018   Procedure: COLONOSCOPY WITH PROPOFOL;  Surgeon: Lin Landsman, MD;  Location: [facility] ENDOSCOPY;  Service: Gastroenterology;  Laterality: N/A;  . ESOPHAGOGASTRODUODENOSCOPY (EGD) WITH PROPOFOL N/A 04/15/2018   Procedure: ESOPHAGOGASTRODUODENOSCOPY (EGD) WITH PROPOFOL;  Surgeon: Lin Landsman, MD;  Location: [facility] ENDOSCOPY;  Service: Gastroenterology;  Laterality: N/A;  . FOOT SURGERY Right    X2  . SHOULDER SURGERY Right   . WRIST SURGERY Bilateral     Family History  Problem Relation Age of Onset  . Diabetes Mother   . Cancer Mother        uterine  . Mental illness Father   . Obesity Daughter   . Diabetes Maternal Grandmother   . Heart disease Brother   . Obesity Daughter     Social History   Tobacco Use  . Smoking status: Never Smoker  . Smokeless tobacco: Never Used  Substance Use Topics  . Alcohol use: No    Alcohol/week: 0.0 standard drinks     Current Outpatient Medications:  .  acetaminophen (TYLENOL) 500 MG tablet, Take 1,000 mg by mouth every 6 (six) hours as needed., Disp: , Rfl:  .  allopurinol (ZYLOPRIM) 100 MG tablet, Take 1 tablet (100 mg total) by mouth 2 (two) times daily., Disp: 180 tablet, Rfl: 1 .  atorvastatin (LIPITOR) 40 MG tablet, Take 1 tablet (40 mg total) by mouth at bedtime., Disp: 90 tablet, Rfl: 1 .  blood glucose meter kit and supplies, Dispense based on  patient and insurance preference. Use up to four times daily as directed. (FOR ICD-10 E10.9, E11.9)., Disp: 1 each, Rfl: 0 .  clotrimazole (LOTRIMIN) 1 % cream, Apply 1 application topically 2 (two) times daily., Disp: 30 g, Rfl: 0 .  fenofibrate (TRICOR) 145 MG tablet, Take 1 tablet (145 mg total) by mouth daily., Disp: 90 tablet, Rfl: 1 .  furosemide (LASIX) 40 MG tablet, Daily (Patient taking differently: Take 40 mg by mouth daily as needed for edema. ), Disp: 90 tablet, Rfl: 1 .   hydrocortisone cream 0.5 %, Apply 1 application topically 2 (two) times daily., Disp: 30 g, Rfl: 0 .  losartan (COZAAR) 100 MG tablet, Take 1 tablet (100 mg total) by mouth daily., Disp: 90 tablet, Rfl: 1 .  Multiple Vitamin (MULTIVITAMIN WITH MINERALS) TABS tablet, Take 1 tablet by mouth daily., Disp: , Rfl:  .  ONETOUCH ULTRA test strip, TEST UP TO FOUR TIMES A DAY AS DIRECTED, Disp: 300 strip, Rfl: 0 .  potassium chloride SA (K-DUR) 20 MEQ tablet, TAKE 1 TABLET(20 MEQ) BY MOUTH DAILY (Patient taking differently: Take 20 mEq by mouth daily as needed. Takes with lasix), Disp: 90 tablet, Rfl: 0 .  Semaglutide,0.25 or 0.5MG/DOS, (OZEMPIC, 0.25 OR 0.5 MG/DOSE,) 2 MG/1.5ML SOPN, Inject 0.5 mg into the skin once a week., Disp: 2 pen, Rfl: 0 .  Thiamine HCl (VITAMIN B-1) 250 MG tablet, Take 250 mg by mouth daily., Disp: , Rfl:  .  venlafaxine XR (EFFEXOR-XR) 150 MG 24 hr capsule, Take 1 capsule (150 mg total) by mouth daily with breakfast., Disp: 180 capsule, Rfl: 0 .  Vitamin D, Ergocalciferol, (DRISDOL) 1.25 MG (50000 UNIT) CAPS capsule, TAKE ONE CAPSULE BY MOUTH EVERY 7 DAYS, Disp: 12 capsule, Rfl: 1  Allergies  Allergen Reactions  . Abilify [Aripiprazole] Shortness Of Breath    Other reaction(s): Difficulty breathing  . Invokana [Canagliflozin] Itching    Yeast infections  . Metformin And Related     Diarrhea   . Lovaza [Omega-3-Acid Ethyl Esters] Other (See Comments)    Burping     I personally reviewed active problem list, medication list, allergies, family history, social history, health maintenance with the patient/caregiver today.   ROS  Constitutional: Negative for fever or weight change.  Respiratory: Negative for cough and shortness of breath.   Cardiovascular: Negative for chest pain or palpitations.  Gastrointestinal: Negative for abdominal pain, no bowel changes.  Musculoskeletal: Negative for gait problem or joint swelling.  Skin: still has redness on left leg   Neurological: Negative for dizziness or headache.  No other specific complaints in a complete review of systems (except as listed in HPI above).  Objective  Vitals:   05/02/19 1535  BP: 130/80  Pulse: (!) 103  Temp: (!) 97.1 F (36.2 C)  TempSrc: Temporal  Weight: 282 lb 1.6 oz (128 kg)  Height: _0  (1.727 m)    Body mass index is 42.89 kg/m.  Physical Exam  Constitutional: Patient appears well-developed and well-nourished. Obese No distress.  HEENT: head atraumatic, normocephalic, pupils equal and reactive to light, neck supple, throat within normal limits Cardiovascular: Normal rate, regular rhythm and normal heart sounds.  No murmur heard.  BLE edema, left more than right, some erythema and still mild increase in warmth on posterior left leg . Pulmonary/Chest: Effort normal and breath sounds normal. No respiratory distress. Abdominal: Soft.  There is no tenderness. Psychiatric: Patient has a normal mood and affect. behavior is normal. Judgment and thought content  normal.  Recent Results (from the past 2160 hour(s))  CBC with Differential/Platelet     Status: None   Collection Time: 03/18/19 10:45 AM  Result Value Ref Range   WBC 5.8 3.4 - 10.8 x10E3/uL   RBC 4.83 3.77 - 5.28 x10E6/uL   Hemoglobin 15.4 11.1 - 15.9 g/dL   Hematocrit 44.5 34.0 - 46.6 %   MCV 92 79 - 97 fL   MCH 31.9 26.6 - 33.0 pg   MCHC 34.6 31.5 - 35.7 g/dL   RDW 14.0 11.7 - 15.4 %   Platelets 185 150 - 450 x10E3/uL   Neutrophils 58 Not Estab. %   Lymphs 32 Not Estab. %   Monocytes 8 Not Estab. %   Eos 1 Not Estab. %   Basos 1 Not Estab. %   Neutrophils Absolute 3.3 1.4 - 7.0 x10E3/uL   Lymphocytes Absolute 1.9 0.7 - 3.1 x10E3/uL   Monocytes Absolute 0.5 0.1 - 0.9 x10E3/uL   EOS (ABSOLUTE) 0.1 0.0 - 0.4 x10E3/uL   Basophils Absolute 0.0 0.0 - 0.2 x10E3/uL   Immature Granulocytes 0 Not Estab. %   Immature Grans (Abs) 0.0 0.0 - 0.1 x10E3/uL  Comprehensive metabolic panel     Status: Abnormal    Collection Time: 03/18/19 10:45 AM  Result Value Ref Range   Glucose 267 (H) 65 - 99 mg/dL   BUN 10 8 - 27 mg/dL   Creatinine, Ser 0.55 (L) 0.57 - 1.00 mg/dL   GFR calc non Af Amer 99 >59 mL/min/1.73   GFR calc Af Amer 115 >59 mL/min/1.73   BUN/Creatinine Ratio 18 12 - 28   Sodium 141 134 - 144 mmol/L   Potassium 4.3 3.5 - 5.2 mmol/L   Chloride 101 96 - 106 mmol/L   CO2 23 20 - 29 mmol/L   Calcium 9.5 8.7 - 10.3 mg/dL   Total Protein 7.3 6.0 - 8.5 g/dL   Albumin 4.1 3.8 - 4.8 g/dL   Globulin, Total 3.2 1.5 - 4.5 g/dL   Albumin/Globulin Ratio 1.3 1.2 - 2.2   Bilirubin Total 0.8 0.0 - 1.2 mg/dL   Alkaline Phosphatase 82 39 - 117 IU/L   AST 35 0 - 40 IU/L   ALT 34 (H) 0 - 32 IU/L  Magnesium     Status: None   Collection Time: 03/18/19 10:45 AM  Result Value Ref Range   Magnesium 1.7 1.6 - 2.3 mg/dL  Lipid panel     Status: Abnormal   Collection Time: 03/18/19 10:45 AM  Result Value Ref Range   Cholesterol, Total 163 100 - 199 mg/dL   Triglycerides 347 (H) 0 - 149 mg/dL   HDL 34 (L) >39 mg/dL   VLDL Cholesterol Cal 55 (H) 5 - 40 mg/dL   LDL Chol Calc (NIH) 74 0 - 99 mg/dL   Chol/HDL Ratio 4.8 (H) 0.0 - 4.4 ratio    Comment:                                   T. Chol/HDL Ratio                                             Men  Women  1/2 Avg.Risk  3.4    3.3                                   Avg.Risk  5.0    4.4                                2X Avg.Risk  9.6    7.1                                3X Avg.Risk 23.4   11.0   Hemoglobin A1c     Status: Abnormal   Collection Time: 03/18/19 10:45 AM  Result Value Ref Range   Hgb A1c MFr Bld 8.5 (H) 4.8 - 5.6 %    Comment:          Prediabetes: 5.7 - 6.4          Diabetes: >6.4          Glycemic control for adults with diabetes: <7.0    Est. average glucose Bld gHb Est-mCnc 197 mg/dL  VITAMIN D 25 Hydroxy (Vit-D Deficiency, Fractures)     Status: Abnormal   Collection Time: 03/18/19 10:45 AM   Result Value Ref Range   Vit D, 25-Hydroxy 20.4 (L) 30.0 - 100.0 ng/mL    Comment: Vitamin D deficiency has been defined by the [place] and an Endocrine Society practice guideline as a level of serum 25-OH vitamin D less than 20 ng/mL (1,2). The Endocrine Society went on to further define vitamin D insufficiency as a level between 21 and 29 ng/mL (2). 1. IOM (Institute of Medicine). 2010. Dietary reference    intakes for calcium and D. [place]: The    [employer]. 2. Holick MF, Binkley Sandoval, Bischoff-Ferrari HA, et al.    Evaluation, treatment, and prevention of vitamin D    deficiency: an Endocrine Society clinical practice    guideline. JCEM. 2011 Jul; 96(7):1911-30.   Vitamin B12     Status: Abnormal   Collection Time: 03/18/19 10:45 AM  Result Value Ref Range   Vitamin B-12 1,696 (H) 232 - 1,245 pg/mL  POCT HgB A1C     Status: Abnormal   Collection Time: 03/18/19 12:12 PM  Result Value Ref Range   Hemoglobin A1C 8.1 (A) 4.0 - 5.6 %   HbA1c POC (<> result, manual entry)     HbA1c, POC (prediabetic range)     HbA1c, POC (controlled diabetic range)    Lactic acid, plasma     Status: Abnormal   Collection Time: 04/21/19 12:19 PM  Result Value Ref Range   Lactic Acid, Venous 3.3 (HH) 0.5 - 1.9 mmol/L    Comment: CRITICAL RESULT CALLED TO, READ BACK BY AND VERIFIED WITH LIZ GANNON 04/21/19 1314 KLW Performed at [facility], [address] [place], [place] [address]   Comprehensive metabolic panel     Status: Abnormal   Collection Time: 04/21/19 12:19 PM  Result Value Ref Range   Sodium 135 135 - 145 mmol/L   Potassium 3.6 3.5 - 5.1 mmol/L   Chloride 97 (L) 98 - 111 mmol/L   CO2 26 22 - 32 mmol/L   Glucose, Bld 300 (H) 70 - 99 mg/dL    Comment: Glucose reference range applies only to samples taken  after fasting for at least 8 hours.   BUN 17 8 - 23 mg/dL   Creatinine, Ser 0.68 0.44 - 1.00 mg/dL   Calcium 8.9 8.9 - 10.3 mg/dL    Total Protein 8.3 (H) 6.5 - 8.1 g/dL   Albumin 3.7 3.5 - 5.0 g/dL   AST 38 15 - 41 U/L   ALT 32 0 - 44 U/L   Alkaline Phosphatase 55 38 - 126 U/L   Total Bilirubin 1.5 (H) 0.3 - 1.2 mg/dL   GFR calc non Af Amer >60 >60 mL/min   GFR calc Af Amer >60 >60 mL/min   Anion gap 12 5 - 15    Comment: Performed at [facility], [address] [place], [place] [address]  CBC with Differential     Status: Abnormal   Collection Time: 04/21/19 12:19 PM  Result Value Ref Range   WBC 7.9 4.0 - 10.5 K/uL   RBC 4.93 3.87 - 5.11 MIL/uL   Hemoglobin 15.9 (H) 12.0 - 15.0 g/dL   HCT 46.0 36.0 - 46.0 %   MCV 93.3 80.0 - 100.0 fL   MCH 32.3 26.0 - 34.0 pg   MCHC 34.6 30.0 - 36.0 g/dL   RDW 13.6 11.5 - 15.5 %   Platelets 158 150 - 400 K/uL   nRBC 0.0 0.0 - 0.2 %   Neutrophils Relative % 75 %   Neutro Abs 5.9 1.7 - 7.7 K/uL   Lymphocytes Relative 17 %   Lymphs Abs 1.3 0.7 - 4.0 K/uL   Monocytes Relative 7 %   Monocytes Absolute 0.6 0.1 - 1.0 K/uL   Eosinophils Relative 0 %   Eosinophils Absolute 0.0 0.0 - 0.5 K/uL   Basophils Relative 1 %   Basophils Absolute 0.0 0.0 - 0.1 K/uL   Immature Granulocytes 0 %   Abs Immature Granulocytes 0.03 0.00 - 0.07 K/uL    Comment: Performed at [facility], [address] [place], [place] [address]  Lactic acid, plasma     Status: Abnormal   Collection Time: 04/21/19  2:21 PM  Result Value Ref Range   Lactic Acid, Venous 3.0 (HH) 0.5 - 1.9 mmol/L    Comment: CRITICAL VALUE NOTED. VALUE IS CONSISTENT WITH PREVIOUSLY REPORTED/CALLED VALUE KLW Performed at [facility], [address] [place], [place] [address]   Blood Culture (routine x 2)     Status: Abnormal   Collection Time: 04/21/19  3:23 PM   Specimen: BLOOD  Result Value Ref Range   Specimen Description      BLOOD LEFT ANTECUBITAL Performed at [facility], [address] [place], [place] [address]    Special Requests      BOTTLES DRAWN AEROBIC AND  ANAEROBIC Blood Culture results may not be optimal due to an excessive volume of blood received in culture bottles Performed at [facility], [address] [place], [place] [address]    Culture  Setup Time      GRAM POSITIVE RODS AEROBIC BOTTLE ONLY CRITICAL RESULT CALLED TO, READ BACK BY AND VERIFIED WITH: ABBY ELLINGTON ON 04/22/19 AT 1223 QSD Performed at [facility], [address] [place], [place] [address]    Culture (A)     DIPHTHEROIDS(CORYNEBACTERIUM SPECIES) Standardized susceptibility testing for this organism is not available. STAPHYLOCOCCUS SPECIES (COAGULASE NEGATIVE) THE SIGNIFICANCE OF ISOLATING THIS ORGANISM FROM A SINGLE SET OF BLOOD CULTURES WHEN MULTIPLE SETS ARE DRAWN IS UNCERTAIN. PLEASE NOTIFY THE MICROBIOLOGY DEPARTMENT WITHIN ONE WEEK  IF SPECIATION AND SENSITIVITIES ARE REQUIRED. Performed at [facility], [place] [address], [place], [place] [address]    Report Status 04/24/2019 FINAL   Blood Culture (routine x 2)     Status: None   Collection Time: 04/21/19  3:23 PM   Specimen: BLOOD  Result Value Ref Range   Specimen Description BLOOD RIGHT ANTECUBITAL    Special Requests      BOTTLES DRAWN AEROBIC AND ANAEROBIC Blood Culture results may not be optimal due to an excessive volume of blood received in culture bottles   Culture      NO GROWTH 5 DAYS Performed at [facility], [address], [place], [place] [address]    Report Status 04/26/2019 FINAL   SARS CORONAVIRUS 2 (TAT 6-24 HRS) Nasopharyngeal Nasopharyngeal Swab     Status: None   Collection Time: 04/21/19  3:47 PM   Specimen: Nasopharyngeal Swab  Result Value Ref Range   SARS Coronavirus 2 NEGATIVE NEGATIVE    Comment: (NOTE) SARS-CoV-2 target nucleic acids are NOT DETECTED. The SARS-CoV-2 RNA is generally detectable in upper and lower respiratory specimens during the acute phase of infection. Negative results do not preclude SARS-CoV-2 infection, do not rule  out co-infections with other pathogens, and should not be used as the sole basis for treatment or other patient management decisions. Negative results must be combined with clinical observations, patient history, and epidemiological information. The expected result is Negative. Fact Sheet for Patients: SugarRoll.be Fact Sheet for Healthcare Providers: https://www.woods-mathews.com/ This test is not yet approved or cleared by the [place] FDA and  has been authorized for detection and/or diagnosis of SARS-CoV-2 by FDA under an Emergency Use Authorization (EUA). This EUA will remain  in effect (meaning this test can be used) for the duration of the COVID-19 declaration under Section 56 4(b)(1) of the Act, 21 U.S.C. section 360bbb-3(b)(1), unless the authorization is terminated or revoked sooner. Performed at [facility], [place] [address], [place], [place] [address]   Glucose, capillary     Status: Abnormal   Collection Time: 04/21/19  5:16 PM  Result Value Ref Range   Glucose-Capillary 194 (H) 70 - 99 mg/dL    Comment: Glucose reference range applies only to samples taken after fasting for at least 8 hours.   Comment 1 Notify RN   Glucose, capillary     Status: Abnormal   Collection Time: 04/21/19  9:13 PM  Result Value Ref Range   Glucose-Capillary 188 (H) 70 - 99 mg/dL    Comment: Glucose reference range applies only to samples taken after fasting for at least 8 hours.  CBC     Status: None   Collection Time: 04/22/19  4:53 AM  Result Value Ref Range   WBC 7.3 4.0 - 10.5 K/uL   RBC 4.58 3.87 - 5.11 MIL/uL   Hemoglobin 14.5 12.0 - 15.0 g/dL   HCT 42.4 36.0 - 46.0 %   MCV 92.6 80.0 - 100.0 fL   MCH 31.7 26.0 - 34.0 pg   MCHC 34.2 30.0 - 36.0 g/dL   RDW 13.5 11.5 - 15.5 %   Platelets 150 150 - 400 K/uL   nRBC 0.0 0.0 - 0.2 %    Comment: Performed at [facility], [address] [place], [place] [address]   Comprehensive metabolic panel     Status: Abnormal   Collection Time: 04/22/19  4:53 AM  Result Value Ref Range   Sodium 137 135 - 145  mmol/L   Potassium 3.4 (L) 3.5 - 5.1 mmol/L   Chloride 102 98 - 111 mmol/L   CO2 23 22 - 32 mmol/L   Glucose, Bld 241 (H) 70 - 99 mg/dL    Comment: Glucose reference range applies only to samples taken after fasting for at least 8 hours.   BUN 15 8 - 23 mg/dL   Creatinine, Ser 0.58 0.44 - 1.00 mg/dL   Calcium 8.6 (L) 8.9 - 10.3 mg/dL   Total Protein 7.6 6.5 - 8.1 g/dL   Albumin 3.5 3.5 - 5.0 g/dL   AST 37 15 - 41 U/L   ALT 29 0 - 44 U/L   Alkaline Phosphatase 55 38 - 126 U/L   Total Bilirubin 1.1 0.3 - 1.2 mg/dL   GFR calc non Af Amer >60 >60 mL/min   GFR calc Af Amer >60 >60 mL/min   Anion gap 12 5 - 15    Comment: Performed at [facility], [address] [place], [place] [address]  Lactic acid, plasma     Status: Abnormal   Collection Time: 04/22/19  4:53 AM  Result Value Ref Range   Lactic Acid, Venous 2.2 (HH) 0.5 - 1.9 mmol/L    Comment: CRITICAL VALUE NOTED. VALUE IS CONSISTENT WITH PREVIOUSLY REPORTED/CALLED VALUE RWW Performed at [facility], [address] [place], [place] [address]   Glucose, capillary     Status: Abnormal   Collection Time: 04/22/19  7:50 AM  Result Value Ref Range   Glucose-Capillary 209 (H) 70 - 99 mg/dL    Comment: Glucose reference range applies only to samples taken after fasting for at least 8 hours.   Comment 1 Notify RN   Lactic acid, plasma     Status: Abnormal   Collection Time: 04/22/19  8:54 AM  Result Value Ref Range   Lactic Acid, Venous 2.3 (HH) 0.5 - 1.9 mmol/L    Comment: CRITICAL VALUE NOTED. VALUE IS CONSISTENT WITH PREVIOUSLY REPORTED/CALLED VALUE.PMF Performed at [facility], [address] [place], [place] [address]   Glucose, capillary     Status: Abnormal   Collection Time: 04/22/19 11:53 AM  Result Value Ref Range   Glucose-Capillary 258 (H) 70 - 99  mg/dL    Comment: Glucose reference range applies only to samples taken after fasting for at least 8 hours.   Comment 1 Notify RN   Lactic acid, plasma     Status: Abnormal   Collection Time: 04/22/19  3:49 PM  Result Value Ref Range   Lactic Acid, Venous 3.5 (HH) 0.5 - 1.9 mmol/L    Comment: CRITICAL VALUE NOTED. VALUE IS CONSISTENT WITH PREVIOUSLY REPORTED/CALLED VALUE.PMF Performed at [facility], [address] [place], [place] [address]   Glucose, capillary     Status: Abnormal   Collection Time: 04/22/19  6:16 PM  Result Value Ref Range   Glucose-Capillary 213 (H) 70 - 99 mg/dL    Comment: Glucose reference range applies only to samples taken after fasting for at least 8 hours.  Glucose, capillary     Status: Abnormal   Collection Time: 04/22/19  9:13 PM  Result Value Ref Range   Glucose-Capillary 166 (H) 70 - 99 mg/dL    Comment: Glucose reference range applies only to samples taken after fasting for at least 8 hours.  CBC     Status: Abnormal   Collection Time: 04/23/19  5:04 AM  Result Value Ref Range   WBC 4.5 4.0 -  10.5 K/uL   RBC 4.08 3.87 - 5.11 MIL/uL   Hemoglobin 12.9 12.0 - 15.0 g/dL   HCT 37.7 36.0 - 46.0 %   MCV 92.4 80.0 - 100.0 fL   MCH 31.6 26.0 - 34.0 pg   MCHC 34.2 30.0 - 36.0 g/dL   RDW 13.3 11.5 - 15.5 %   Platelets 147 (L) 150 - 400 K/uL   nRBC 0.0 0.0 - 0.2 %    Comment: Performed at [facility], [address] [place], [place] [address]  Basic metabolic panel     Status: Abnormal   Collection Time: 04/23/19  5:04 AM  Result Value Ref Range   Sodium 138 135 - 145 mmol/L   Potassium 3.2 (L) 3.5 - 5.1 mmol/L   Chloride 106 98 - 111 mmol/L   CO2 21 (L) 22 - 32 mmol/L   Glucose, Bld 177 (H) 70 - 99 mg/dL    Comment: Glucose reference range applies only to samples taken after fasting for at least 8 hours.   BUN 11 8 - 23 mg/dL   Creatinine, Ser 0.42 (L) 0.44 - 1.00 mg/dL   Calcium 8.1 (L) 8.9 - 10.3 mg/dL   GFR calc non Af  Amer >60 >60 mL/min   GFR calc Af Amer >60 >60 mL/min   Anion gap 11 5 - 15    Comment: Performed at [facility], [address] [place], [place] [address]  Glucose, capillary     Status: Abnormal   Collection Time: 04/23/19  7:48 AM  Result Value Ref Range   Glucose-Capillary 203 (H) 70 - 99 mg/dL    Comment: Glucose reference range applies only to samples taken after fasting for at least 8 hours.  Lactic acid, plasma     Status: Abnormal   Collection Time: 04/23/19  8:52 AM  Result Value Ref Range   Lactic Acid, Venous 2.1 (HH) 0.5 - 1.9 mmol/L    Comment: CRITICAL VALUE NOTED. VALUE IS CONSISTENT WITH PREVIOUSLY REPORTED/CALLED VALUE.PMF Performed at [facility], [address] [place], [place] [address]   Lactic acid, plasma     Status: Abnormal   Collection Time: 04/23/19 11:41 AM  Result Value Ref Range   Lactic Acid, Venous 2.4 (HH) 0.5 - 1.9 mmol/L    Comment: CRITICAL VALUE NOTED. VALUE IS CONSISTENT WITH PREVIOUSLY REPORTED/CALLED VALUE / JAG Performed at [facility], [address] [place], [place] [address]   Glucose, capillary     Status: Abnormal   Collection Time: 04/23/19 11:52 AM  Result Value Ref Range   Glucose-Capillary 209 (H) 70 - 99 mg/dL    Comment: Glucose reference range applies only to samples taken after fasting for at least 8 hours.  Urinalysis, Complete w Microscopic     Status: Abnormal   Collection Time: 04/23/19  3:23 PM  Result Value Ref Range   Color, Urine YELLOW (A) YELLOW   APPearance CLEAR (A) CLEAR   Specific Gravity, Urine 1.018 1.005 - 1.030   pH 5.0 5.0 - 8.0   Glucose, UA NEGATIVE NEGATIVE mg/dL   Hgb urine dipstick NEGATIVE NEGATIVE   Bilirubin Urine NEGATIVE NEGATIVE   Ketones, ur NEGATIVE NEGATIVE mg/dL   Protein, ur NEGATIVE NEGATIVE mg/dL   Nitrite NEGATIVE NEGATIVE   Leukocytes,Ua NEGATIVE NEGATIVE   RBC / HPF 0-5 0 - 5 RBC/hpf   WBC, UA 0-5 0 - 5 WBC/hpf   Bacteria, UA NONE SEEN NONE  SEEN   Squamous Epithelial /  LPF 0-5 0 - 5   Mucus PRESENT    Hyaline Casts, UA PRESENT     Comment: Performed at [facility], [address], [place], [place] [address]  Urine Culture     Status: None   Collection Time: 04/23/19  3:23 PM   Specimen: Urine, Random  Result Value Ref Range   Specimen Description      URINE, RANDOM Performed at [facility], [address] [place], [place] [address]    Special Requests      NONE Performed at [facility], [address], [place], [place] [address]    Culture      NO GROWTH Performed at [facility], [place] [address], [place], [place] [address]    Report Status 04/25/2019 FINAL   Glucose, capillary     Status: Abnormal   Collection Time: 04/23/19  5:05 PM  Result Value Ref Range   Glucose-Capillary 153 (H) 70 - 99 mg/dL    Comment: Glucose reference range applies only to samples taken after fasting for at least 8 hours.  Glucose, capillary     Status: Abnormal   Collection Time: 04/23/19  9:20 PM  Result Value Ref Range   Glucose-Capillary 149 (H) 70 - 99 mg/dL    Comment: Glucose reference range applies only to samples taken after fasting for at least 8 hours.   Comment 1 Notify RN   Glucose, capillary     Status: Abnormal   Collection Time: 04/24/19  7:52 AM  Result Value Ref Range   Glucose-Capillary 203 (H) 70 - 99 mg/dL    Comment: Glucose reference range applies only to samples taken after fasting for at least 8 hours.   Comment 1 Notify RN   CBC     Status: Abnormal   Collection Time: 04/24/19  8:24 AM  Result Value Ref Range   WBC 4.3 4.0 - 10.5 K/uL   RBC 4.05 3.87 - 5.11 MIL/uL   Hemoglobin 13.1 12.0 - 15.0 g/dL   HCT 38.2 36.0 - 46.0 %   MCV 94.3 80.0 - 100.0 fL   MCH 32.3 26.0 - 34.0 pg   MCHC 34.3 30.0 - 36.0 g/dL   RDW 13.2 11.5 - 15.5 %   Platelets 146 (L) 150 - 400 K/uL   nRBC 0.0 0.0 - 0.2 %    Comment: Performed at [facility], [address]  [place], [place] [address]  Basic metabolic panel     Status: Abnormal   Collection Time: 04/24/19  8:24 AM  Result Value Ref Range   Sodium 140 135 - 145 mmol/L   Potassium 3.9 3.5 - 5.1 mmol/L   Chloride 110 98 - 111 mmol/L   CO2 21 (L) 22 - 32 mmol/L   Glucose, Bld 188 (H) 70 - 99 mg/dL    Comment: Glucose reference range applies only to samples taken after fasting for at least 8 hours.   BUN 9 8 - 23 mg/dL   Creatinine, Ser 0.41 (L) 0.44 - 1.00 mg/dL   Calcium 8.0 (L) 8.9 - 10.3 mg/dL   GFR calc non Af Amer >60 >60 mL/min   GFR calc Af Amer >60 >60 mL/min   Anion gap 9 5 - 15    Comment: Performed at [facility], [address] [place], [place] [address]  Lactic acid, plasma     Status: None   Collection Time: 04/24/19  8:24 AM  Result Value Ref Range  Lactic Acid, Venous 1.7 0.5 - 1.9 mmol/L    Comment: Performed at [facility], [address] [place], [place] [address]  CULTURE, BLOOD (ROUTINE X 2) w Reflex to ID Panel     Status: None   Collection Time: 04/24/19 11:40 AM   Specimen: BLOOD  Result Value Ref Range   Specimen Description BLOOD FOREARM    Special Requests      BOTTLES DRAWN AEROBIC AND ANAEROBIC Blood Culture adequate volume   Culture      NO GROWTH 5 DAYS Performed at [facility], [address] [place], [place] [address]    Report Status 04/29/2019 FINAL   CULTURE, BLOOD (ROUTINE X 2) w Reflex to ID Panel     Status: None   Collection Time: 04/24/19 11:40 AM   Specimen: BLOOD  Result Value Ref Range   Specimen Description BLOOD BLOOD LEFT HAND    Special Requests      BOTTLES DRAWN AEROBIC AND ANAEROBIC Blood Culture results may not be optimal due to an inadequate volume of blood received in culture bottles   Culture      NO GROWTH 5 DAYS Performed at [facility], [address] [place], [place] [address]    Report Status 04/29/2019 FINAL   Glucose, capillary     Status: Abnormal   Collection Time:  04/24/19 12:16 PM  Result Value Ref Range   Glucose-Capillary 209 (H) 70 - 99 mg/dL    Comment: Glucose reference range applies only to samples taken after fasting for at least 8 hours.  Glucose, capillary     Status: Abnormal   Collection Time: 04/24/19  4:45 PM  Result Value Ref Range   Glucose-Capillary 147 (H) 70 - 99 mg/dL    Comment: Glucose reference range applies only to samples taken after fasting for at least 8 hours.  Glucose, capillary     Status: Abnormal   Collection Time: 04/24/19  9:58 PM  Result Value Ref Range   Glucose-Capillary 185 (H) 70 - 99 mg/dL    Comment: Glucose reference range applies only to samples taken after fasting for at least 8 hours.  CBC     Status: Abnormal   Collection Time: 04/25/19  5:45 AM  Result Value Ref Range   WBC 4.8 4.0 - 10.5 K/uL   RBC 3.72 (L) 3.87 - 5.11 MIL/uL   Hemoglobin 12.2 12.0 - 15.0 g/dL   HCT 34.9 (L) 36.0 - 46.0 %   MCV 93.8 80.0 - 100.0 fL   MCH 32.8 26.0 - 34.0 pg   MCHC 35.0 30.0 - 36.0 g/dL   RDW 13.2 11.5 - 15.5 %   Platelets 144 (L) 150 - 400 K/uL   nRBC 0.0 0.0 - 0.2 %    Comment: Performed at [facility], [address] [place],  [address]  Basic metabolic panel     Status: Abnormal   Collection Time: 04/25/19  5:45 AM  Result Value Ref Range   Sodium 142 135 - 145 mmol/L   Potassium 3.4 (L) 3.5 - 5.1 mmol/L   Chloride 110 98 - 111 mmol/L   CO2 22 22 - 32 mmol/L   Glucose, Bld 176 (H) 70 - 99 mg/dL    Comment: Glucose reference range applies only to samples taken after fasting for at least 8 hours.   BUN 8 8 - 23 mg/dL   Creatinine, Ser 0.44 0.44 - 1.00 mg/dL   Calcium 8.2 (L) 8.9 - 10.3  mg/dL   GFR calc non Af Amer >60 >60 mL/min   GFR calc Af Amer >60 >60 mL/min   Anion gap 10 5 - 15    Comment: Performed at [facility], [address] [place], [place] [address]  Glucose, capillary     Status: Abnormal   Collection Time: 04/25/19  7:50 AM  Result Value Ref Range    Glucose-Capillary 179 (H) 70 - 99 mg/dL    Comment: Glucose reference range applies only to samples taken after fasting for at least 8 hours.  ECHOCARDIOGRAM COMPLETE     Status: None   Collection Time: 04/25/19  9:38 AM  Result Value Ref Range   Weight 4,467.19 oz   Height 68 in   BP 139/77 mmHg  Glucose, capillary     Status: Abnormal   Collection Time: 04/25/19 12:01 PM  Result Value Ref Range   Glucose-Capillary 195 (H) 70 - 99 mg/dL    Comment: Glucose reference range applies only to samples taken after fasting for at least 8 hours.     PHQ2/9: Depression screen [facility] 2/9 05/02/2019 04/21/2019 03/18/2019 11/03/2018 10/14/2018  Decreased Interest 0 0 0 1 1  Down, Depressed, Hopeless 0 0 _0 PHQ - 2 Score 0 0 _1 Altered sleeping 0 0 1 0 1  Tired, decreased energy 0 0 _2 Change in appetite 0 0 0 0 1  Feeling bad or failure about yourself  0 0 0 1 1  Trouble concentrating 0 0 0 0 0  Moving slowly or fidgety/restless 0 0 0 0 0  Suicidal thoughts 0 0 0 0 0  PHQ-9 Score 0 0 _3 Difficult doing work/chores - Not difficult at all Somewhat difficult Somewhat difficult Somewhat difficult  Some recent data might be hidden    phq 9 is negative   Fall Risk: Fall Risk  05/02/2019 04/21/2019 03/18/2019 11/03/2018 10/14/2018  Falls in the past year? 0 0 0 0 0  Number falls in past yr: 0 0 0 0 0  Injury with Fall? 0 0 0 0 0  Risk for fall due to : - - - - -    Functional Status Survey: Is the patient deaf or have difficulty hearing?: No Does the patient have difficulty seeing, even when wearing glasses/contacts?: No Does the patient have difficulty concentrating, remembering, or making decisions?: No Does the patient have difficulty walking or climbing stairs?: No Does the patient have difficulty dressing or bathing?: No Does the patient have difficulty doing errands alone such as visiting a doctor's office or shopping?: No    Assessment & Plan  1. Thrombocytopenia (HCC)  -  CBC with Differential/Platelet  2. Cellulitis of left leg  - Comprehensive metabolic panel - Ambulatory referral to Vascular Surgery  3. Antibiotic-induced yeast infection  - fluconazole (DIFLUCAN) 150 MG tablet; Take 1 tablet (150 mg total) by mouth every other day.  Dispense: 3 tablet; Refill: 0  4. Type 2 diabetes mellitus, uncontrolled, with neuropathy ([place])  Advised her to check glucose at home  5. Bilateral lower extremity edema  - Ambulatory referral to Vascular Surgery

## 2019-06-15 ENCOUNTER — Ambulatory Visit: Payer: Managed Care, Other (non HMO) | Admitting: Family Medicine

## 2019-09-07 ENCOUNTER — Ambulatory Visit (INDEPENDENT_AMBULATORY_CARE_PROVIDER_SITE_OTHER): Payer: PPO | Admitting: Family Medicine

## 2019-09-07 ENCOUNTER — Other Ambulatory Visit: Payer: Self-pay

## 2019-09-07 ENCOUNTER — Encounter: Payer: Self-pay | Admitting: Family Medicine

## 2019-09-07 VITALS — BP 120/78 | HR 105 | Temp 97.3°F | Resp 16 | Ht 68.0 in | Wt 274.2 lb

## 2019-09-07 DIAGNOSIS — E1169 Type 2 diabetes mellitus with other specified complication: Secondary | ICD-10-CM

## 2019-09-07 DIAGNOSIS — E1140 Type 2 diabetes mellitus with diabetic neuropathy, unspecified: Secondary | ICD-10-CM

## 2019-09-07 DIAGNOSIS — M109 Gout, unspecified: Secondary | ICD-10-CM

## 2019-09-07 DIAGNOSIS — F339 Major depressive disorder, recurrent, unspecified: Secondary | ICD-10-CM

## 2019-09-07 DIAGNOSIS — I10 Essential (primary) hypertension: Secondary | ICD-10-CM | POA: Diagnosis not present

## 2019-09-07 DIAGNOSIS — E538 Deficiency of other specified B group vitamins: Secondary | ICD-10-CM

## 2019-09-07 DIAGNOSIS — F5105 Insomnia due to other mental disorder: Secondary | ICD-10-CM

## 2019-09-07 DIAGNOSIS — E785 Hyperlipidemia, unspecified: Secondary | ICD-10-CM

## 2019-09-07 DIAGNOSIS — E782 Mixed hyperlipidemia: Secondary | ICD-10-CM

## 2019-09-07 DIAGNOSIS — E559 Vitamin D deficiency, unspecified: Secondary | ICD-10-CM | POA: Diagnosis not present

## 2019-09-07 LAB — POCT GLYCOSYLATED HEMOGLOBIN (HGB A1C): Hemoglobin A1C: 6.8 % — AB (ref 4.0–5.6)

## 2019-09-07 MED ORDER — FENOFIBRATE 145 MG PO TABS: 145.0000 mg | ORAL_TABLET | Freq: Every day | ORAL | 1 refills | Status: DC

## 2019-09-07 MED ORDER — ALLOPURINOL 100 MG PO TABS: 100.0000 mg | ORAL_TABLET | Freq: Two times a day (BID) | ORAL | 1 refills | Status: DC

## 2019-09-07 MED ORDER — ATORVASTATIN CALCIUM 40 MG PO TABS: 40.0000 mg | ORAL_TABLET | Freq: Every day | ORAL | 1 refills | Status: DC

## 2019-09-07 MED ORDER — LOSARTAN POTASSIUM 100 MG PO TABS: 100.0000 mg | ORAL_TABLET | Freq: Every day | ORAL | 1 refills | Status: DC

## 2019-09-07 MED ORDER — VENLAFAXINE HCL ER 150 MG PO CP24: 150.0000 mg | ORAL_CAPSULE | Freq: Every day | ORAL | 1 refills | Status: DC

## 2019-09-07 MED ORDER — VITAMIN D (ERGOCALCIFEROL) 1.25 MG (50000 UNIT) PO CAPS: 50000.0000 [IU] | ORAL_CAPSULE | ORAL | 1 refills | Status: DC

## 2019-09-07 MED ORDER — METFORMIN HCL ER 750 MG PO TB24: 750.0000 mg | ORAL_TABLET | Freq: Two times a day (BID) | ORAL | 1 refills | Status: DC

## 2019-09-07 NOTE — Progress Notes (Signed)
Name: Tina Short   MRN: [id number]    DOB: [birth year]   Date:09/07/2019       Progress Note  Subjective  Chief Complaint  Chief Complaint  Patient presents with   Diabetes   Depression   Hypertension   Sleep Apnea   Hyperlipidemia    HPI  DMII: she has a history of DM for many years, A1C is at goal today at 6.8%, she is on Metformin XR- diarrhea resolved, on Ozempic but down to 0.25 mg but cannot stay because of cost. We will try increasing dose of Metformin, continue life style modification. She denies polyphagia, but haspolydipsia, nopolyuria. She takes statin, Lovaza,Losartan ( [facility])for microabuminuriaShe has neuropathy,she denies pain just has numbness and stable, not on medicationand states pain not currently present.She cannottolerate SGL-2 agonist because it caused yeast infection .She has not been taking lovaza because it gives a weird taste in her mouth She is now on Atorvastatin and also Tricor   Morbid obesity:weighthas gone down, she has been cutting down on bread. She was on Ozempic 0.5 mg dose, however went down to 0.25 mg because of change in insurance - now on medicare and will need to replace medication for DM. She states appetite is better, making better food choices and is happy with weight loss.   Major Depression: Her father committed suicide in his 38's and there are other family members with history of psychiatric illness. She denies suicidal thoughts, but would be fine if she died, she states that is a chronic feeling, she states she would never do it. No planning. She still feels hurt about the break down of her previous church - where she was a member for almost 71 years. Phq 9 positive today. Still struggles but does not want to change medications at this time.  OSA: she uses CPAP every night. She had a repeat study, compliant withtreatment.She states that if she does not use CPAP she feels shaky and has a headache when she wakes up.she  tried Provigil but it kept her awake at night. Unchanged   B12 deficiency: shehas been taking supplements otc   Gout: no recent episodes, she is now on Allopurinol twice daily and denies side effects of medication, she is compliant with medication.Denies any flares   Vitamin D:she has rx vitamin D at home   HTN: she is taking medications, bp is at goal, no chest pain, palpitation.    Patient Active Problem List   Diagnosis Date Noted   Esophageal dysphagia    Colon cancer screening 04/30/2016   Major depression, recurrent, chronic ([place]) 10/30/2015   Dyslipidemia associated with type 2 diabetes mellitus ([place]) 10/30/2015   Vitamin D deficiency 07/27/2015   Vitamin B12 deficiency 07/27/2015   History of anemia 09/12/2014   Carpal tunnel syndrome 09/12/2014   Obstructive sleep apnea 09/12/2014   Edema of extremities 09/12/2014   Gout 09/12/2014   Morbid obesity, unspecified obesity type ([place]) 11/15/2009   Benign essential HTN 10/07/2006   Type 2 diabetes mellitus, uncontrolled, with neuropathy ([place]) 10/07/2006   Hyperlipidemia 10/07/2006    Past Surgical History:  Procedure Laterality Date   ABDOMINAL HYSTERECTOMY     COLONOSCOPY     COLONOSCOPY WITH PROPOFOL N/A 05/14/2016   Procedure: COLONOSCOPY WITH PROPOFOL;  Surgeon: [name], MD;  Location: [facility] ENDOSCOPY;  Service: Endoscopy;  Laterality: N/A;   COLONOSCOPY WITH PROPOFOL N/A 04/15/2018   Procedure: COLONOSCOPY WITH PROPOFOL;  Surgeon: Lin Landsman, MD;  Location: [place];  Service: Gastroenterology;  Laterality: N/A;   ESOPHAGOGASTRODUODENOSCOPY (EGD) WITH PROPOFOL N/A 04/15/2018   Procedure: ESOPHAGOGASTRODUODENOSCOPY (EGD) WITH PROPOFOL;  Surgeon: Lin Landsman, MD;  Location: [facility] ENDOSCOPY;  Service: Gastroenterology;  Laterality: N/A;   FOOT SURGERY Right    X2   SHOULDER SURGERY Right    WRIST SURGERY Bilateral     Family History  Problem Relation Age of  Onset   Diabetes Mother    Cancer Mother        uterine   Mental illness Father    Obesity Daughter    Diabetes Maternal Grandmother    Heart disease Brother    Obesity Daughter     Social History   Tobacco Use   Smoking status: Never Smoker   Smokeless tobacco: Never Used  Substance Use Topics   Alcohol use: No    Alcohol/week: 0.0 standard drinks     Current Outpatient Medications:    acetaminophen (TYLENOL) 500 MG tablet, Take 1,000 mg by mouth every 6 (six) hours as needed., Disp: , Rfl:    allopurinol (ZYLOPRIM) 100 MG tablet, Take 1 tablet (100 mg total) by mouth 2 (two) times daily., Disp: 180 tablet, Rfl: 1   atorvastatin (LIPITOR) 40 MG tablet, Take 1 tablet (40 mg total) by mouth at bedtime., Disp: 90 tablet, Rfl: 1   blood glucose meter kit and supplies, Dispense based on patient and insurance preference. Use up to four times daily as directed. (FOR ICD-10 E10.9, E11.9)., Disp: 1 each, Rfl: 0   clotrimazole (LOTRIMIN) 1 % cream, Apply 1 application topically 2 (two) times daily., Disp: 30 g, Rfl: 0   fenofibrate (TRICOR) 145 MG tablet, Take 1 tablet (145 mg total) by mouth daily., Disp: 90 tablet, Rfl: 1   furosemide (LASIX) 40 MG tablet, Daily (Patient taking differently: Take 40 mg by mouth daily as needed for edema. ), Disp: 90 tablet, Rfl: 1   hydrocortisone cream 0.5 %, Apply 1 application topically 2 (two) times daily., Disp: 30 g, Rfl: 0   losartan (COZAAR) 100 MG tablet, Take 1 tablet (100 mg total) by mouth daily., Disp: 90 tablet, Rfl: 1   metFORMIN (GLUCOPHAGE-XR) 750 MG 24 hr tablet, Take 1 tablet (750 mg total) by mouth 2 (two) times daily., Disp: 180 tablet, Rfl: 1   Multiple Vitamin (MULTIVITAMIN WITH MINERALS) TABS tablet, Take 1 tablet by mouth daily., Disp: , Rfl:    ONETOUCH ULTRA test strip, TEST UP TO FOUR TIMES A DAY AS DIRECTED, Disp: 300 strip, Rfl: 0   potassium chloride SA (K-DUR) 20 MEQ tablet, TAKE 1 TABLET(20 MEQ) BY  MOUTH DAILY (Patient taking differently: Take 20 mEq by mouth daily as needed. Takes with lasix), Disp: 90 tablet, Rfl: 0   Thiamine HCl (VITAMIN B-1) 250 MG tablet, Take 250 mg by mouth daily., Disp: , Rfl:    venlafaxine XR (EFFEXOR-XR) 150 MG 24 hr capsule, Take 1 capsule (150 mg total) by mouth daily with breakfast., Disp: 90 capsule, Rfl: 1   Vitamin D, Ergocalciferol, (DRISDOL) 1.25 MG (50000 UNIT) CAPS capsule, Take 1 capsule (50,000 Units total) by mouth every 7 (seven) days., Disp: 12 capsule, Rfl: 1  Allergies  Allergen Reactions   Abilify [Aripiprazole] Shortness Of Breath    Other reaction(s): Difficulty breathing   Invokana [Canagliflozin] Itching    Yeast infections   Metformin And Related     Diarrhea, but doing well with XR formulation     Lovaza [Omega-3-Acid Ethyl Esters] Other (See Comments)  Burping     I personally reviewed active problem list, medication list, allergies, family history, social history, health maintenance with the patient/caregiver today.   ROS  Constitutional: Negative for fever , positiive for weight change.  Respiratory: Negative for cough and shortness of breath.   Cardiovascular: Negative for chest pain or palpitations.  Gastrointestinal: Negative for abdominal pain, no bowel changes.  Musculoskeletal: Negative for gait problem or joint swelling.  Skin: Negative for rash.  Neurological: Negative for dizziness or headache.  No other specific complaints in a complete review of systems (except as listed in HPI above).  Objective  Vitals:   09/07/19 0946  BP: 120/78  Pulse: 105  Resp: 16  Temp: (!) 97.3 F (36.3 C)  TempSrc: Temporal  SpO2: 97%  Weight: (!) 274 lb 3.2 oz (124.4 kg)  Height: '5\' 8"'  (1.727 m)    Body mass index is 41.69 kg/m.  Physical Exam  Constitutional: Patient appears well-developed and well-nourished. Obese  No distress.  HEENT: head atraumatic, normocephalic, pupils equal and reactive to light,   neck supple Cardiovascular: Normal rate, regular rhythm and normal heart sounds.  No murmur heard. Positive for  BLE edema. Pulmonary/Chest: Effort normal and breath sounds normal. No respiratory distress. Abdominal: Soft.  There is no tenderness. Psychiatric: Patient has a normal mood and affect. behavior is normal. Judgment and thought content normal.  PHQ2/9: Depression screen [facility] 2/9 09/07/2019 09/07/2019 05/02/2019 04/21/2019 03/18/2019  Decreased Interest 2 0 0 0 0  Down, Depressed, Hopeless 1 0 0 0 1  PHQ - 2 Score 3 0 0 0 1  Altered sleeping 2 0 0 0 1  Tired, decreased energy 2 0 0 0 1  Change in appetite 0 0 0 0 0  Feeling bad or failure about yourself  1 0 0 0 0  Trouble concentrating 0 0 0 0 0  Moving slowly or fidgety/restless 0 0 0 0 0  Suicidal thoughts 0 0 0 0 0  PHQ-9 Score 8 0 0 0 3  Difficult doing work/chores Not difficult at all - - Not difficult at all Somewhat difficult  Some recent data might be hidden    phq 9 is negative   Fall Risk: Fall Risk  09/07/2019 05/02/2019 04/21/2019 03/18/2019 11/03/2018  Falls in the past year? 0 0 0 0 0  Number falls in past yr: 0 0 0 0 0  Injury with Fall? 0 0 0 0 0  Risk for fall due to : - - - - -    Assessment & Plan  1. Controlled type 2 diabetes mellitus with neuropathy (HCC)  - POCT HgB A1C  2. Morbid obesity, unspecified obesity type [facility])  Discussed with the patient the risk posed by an increased BMI. Discussed importance of portion control, calorie counting and at least 150 minutes of physical activity weekly. Avoid sweet beverages and drink more water. Eat at least 6 servings of fruit and vegetables daily   3. Benign essential HTN  - losartan (COZAAR) 100 MG tablet; Take 1 tablet (100 mg total) by mouth daily.  Dispense: 90 tablet; Refill: 1  4. Mixed dyslipidemia  - atorvastatin (LIPITOR) 40 MG tablet; Take 1 tablet (40 mg total) by mouth at bedtime.  Dispense: 90 tablet; Refill: 1  5. Vitamin B12  deficiency   6. Vitamin D deficiency  - Vitamin D, Ergocalciferol, (DRISDOL) 1.25 MG (50000 UNIT) CAPS capsule; Take 1 capsule (50,000 Units total) by mouth every 7 (seven) days.  Dispense: 12  capsule; Refill: 1  7. Major depression, recurrent, chronic (HCC)  - venlafaxine XR (EFFEXOR-XR) 150 MG 24 hr capsule; Take 1 capsule (150 mg total) by mouth daily with breakfast.  Dispense: 90 capsule; Refill: 1  8. Controlled gout  - allopurinol (ZYLOPRIM) 100 MG tablet; Take 1 tablet (100 mg total) by mouth 2 (two) times daily.  Dispense: 180 tablet; Refill: 1  9. Dyslipidemia associated with type 2 diabetes mellitus (HCC)  - atorvastatin (LIPITOR) 40 MG tablet; Take 1 tablet (40 mg total) by mouth at bedtime.  Dispense: 90 tablet; Refill: 1  10. Insomnia due to mental disorder  Discussed sleep hygiene

## 2019-12-29 ENCOUNTER — Other Ambulatory Visit: Payer: Self-pay | Admitting: Family Medicine

## 2019-12-29 DIAGNOSIS — Z1231 Encounter for screening mammogram for malignant neoplasm of breast: Secondary | ICD-10-CM

## 2020-01-09 ENCOUNTER — Ambulatory Visit (INDEPENDENT_AMBULATORY_CARE_PROVIDER_SITE_OTHER): Payer: PPO | Admitting: Family Medicine

## 2020-01-09 ENCOUNTER — Other Ambulatory Visit: Payer: Self-pay

## 2020-01-09 ENCOUNTER — Encounter: Payer: Self-pay | Admitting: Family Medicine

## 2020-01-09 VITALS — BP 118/76 | HR 96 | Temp 97.6°F | Resp 18 | Ht 68.0 in | Wt 276.7 lb

## 2020-01-09 DIAGNOSIS — E1169 Type 2 diabetes mellitus with other specified complication: Secondary | ICD-10-CM

## 2020-01-09 DIAGNOSIS — M109 Gout, unspecified: Secondary | ICD-10-CM

## 2020-01-09 DIAGNOSIS — Z23 Encounter for immunization: Secondary | ICD-10-CM

## 2020-01-09 DIAGNOSIS — E559 Vitamin D deficiency, unspecified: Secondary | ICD-10-CM | POA: Diagnosis not present

## 2020-01-09 DIAGNOSIS — E538 Deficiency of other specified B group vitamins: Secondary | ICD-10-CM

## 2020-01-09 DIAGNOSIS — E782 Mixed hyperlipidemia: Secondary | ICD-10-CM

## 2020-01-09 DIAGNOSIS — E785 Hyperlipidemia, unspecified: Secondary | ICD-10-CM | POA: Diagnosis not present

## 2020-01-09 DIAGNOSIS — F339 Major depressive disorder, recurrent, unspecified: Secondary | ICD-10-CM | POA: Diagnosis not present

## 2020-01-09 DIAGNOSIS — E1140 Type 2 diabetes mellitus with diabetic neuropathy, unspecified: Secondary | ICD-10-CM | POA: Diagnosis not present

## 2020-01-09 DIAGNOSIS — I10 Essential (primary) hypertension: Secondary | ICD-10-CM

## 2020-01-09 LAB — POCT GLYCOSYLATED HEMOGLOBIN (HGB A1C): Hemoglobin A1C: 8 % — AB (ref 4.0–5.6)

## 2020-01-09 MED ORDER — ATORVASTATIN CALCIUM 40 MG PO TABS: 40.0000 mg | ORAL_TABLET | Freq: Every day | ORAL | 1 refills | Status: DC

## 2020-01-09 MED ORDER — ALLOPURINOL 100 MG PO TABS: 100.0000 mg | ORAL_TABLET | Freq: Two times a day (BID) | ORAL | 1 refills | Status: DC

## 2020-01-09 MED ORDER — VITAMIN D (ERGOCALCIFEROL) 1.25 MG (50000 UNIT) PO CAPS: 50000.0000 [IU] | ORAL_CAPSULE | ORAL | 1 refills | Status: DC

## 2020-01-09 MED ORDER — GLIPIZIDE ER 5 MG PO TB24: 5.0000 mg | ORAL_TABLET | Freq: Every day | ORAL | 1 refills | Status: DC

## 2020-01-09 MED ORDER — VENLAFAXINE HCL ER 150 MG PO CP24: 150.0000 mg | ORAL_CAPSULE | Freq: Every day | ORAL | 1 refills | Status: DC

## 2020-01-09 MED ORDER — LOSARTAN POTASSIUM 100 MG PO TABS: 100.0000 mg | ORAL_TABLET | Freq: Every day | ORAL | 1 refills | Status: DC

## 2020-01-09 NOTE — Progress Notes (Signed)
Name: Tina Short   MRN: [id number]    DOB: Jun 27, 1955   Date:01/09/2020       Progress Note  Subjective  Chief Complaint  Follow up  HPI   DMII: she has a history of DM for many years, A1C was  at goal last visit at  6.8% but is now up to 8 %, she is on Metformin XR- diarrhea resolved, off Ozempic due to cost. She denies polyphagia, but haspolydipsia, nopolyuria. She takes statin, Lovaza,Losartan ( ARB)for microabuminuriaShe has neuropathy,she denies pain just has numbness and stable, not on medicationand states pain not currently present.She cannottolerate SGL-2 agonist because it caused yeast infection .She has not been taking lovaza because it gives a weird taste in her mouth She is now on Atorvastatin and also Tricor . She states not eating sweets , she does not eat a lot of bread of pasta, she thinks her weakness is potatoes. She is still drinking tea but down to a few times a weeks, drinking more water. We will add Glipizide and also explained try to reduce starch intake overall   Morbid obesity:weighthas gone down, she has been cutting down on bread. She was on Ozempic 0.5 mg dose, however stopped due to cost, now on medicare and will need to replace medication for DM. Weight is stable, only gained 2 lbs since last visit   Major Depression: Her father committed suicide in his 106's and there are other family members with history of psychiatric illness. She denies suicidal thoughts, but would be fine if she died, she states that is a chronic feeling, she states she would never do it. No planning. She still feels hurt about the break down of her previous church - where she was a member for almost 36 years. Phq 9 is stable, she states she has to sleep with TV on to keep her mind busy, gets up and goes to bed and is able to fall back asleep. Still struggles but does not want to change medications at this time, insurance will not cover the higher dose   OSA: she uses CPAP every  night. She had a repeat study, compliant withtreatment.She states that if she does not use CPAP she feels shaky and has a headache when she wakes up.She is compliant   B12 deficiency: shehas been taking supplements otc   Gout: no recent episodes, she is now on Allopurinol twice daily and denies side effects of medication, she is compliant with medication. No recent flares   Vitamin D:she has rx vitamin D at home , she has been compliant   HTN: she is taking medications, bp is at goal, no chest pain, palpitation, swelling on legs has improved, she is no longer sitting all day - since she retired, only taking lasix and potassium prn   Patient Active Problem List   Diagnosis Date Noted   Esophageal dysphagia    Colon cancer screening 04/30/2016   Major depression, recurrent, chronic ([place]) 10/30/2015   Dyslipidemia associated with type 2 diabetes mellitus ([place]) 10/30/2015   Vitamin D deficiency 07/27/2015   Vitamin B12 deficiency 07/27/2015   History of anemia 09/12/2014   Carpal tunnel syndrome 09/12/2014   Obstructive sleep apnea 09/12/2014   Edema of extremities 09/12/2014   Gout 09/12/2014   Morbid obesity, unspecified obesity type ([place]) 11/15/2009   Benign essential HTN 10/07/2006   Type 2 diabetes mellitus, uncontrolled, with neuropathy ([place]) 10/07/2006   Hyperlipidemia 10/07/2006    Past Surgical History:  Procedure Laterality  Date   ABDOMINAL HYSTERECTOMY     COLONOSCOPY     COLONOSCOPY WITH PROPOFOL N/A 05/14/2016   Procedure: COLONOSCOPY WITH PROPOFOL;  Surgeon: Earline Mayotte, MD;  [facility] END[facility]COPY;  Service: Endoscopy;  Laterality: N/A;   COLONOSCOPY WITH PROPOFOL N/A 04/15/2018   Procedure: COLONOSCOPY WITH PROPOFOL;  Surgeon: Toney Reil, MD;  Location[facility] ENDOSCOPY;  Service: Gastroenterology;  Laterality: N/A;   ESOPHAGOGASTRODUODENOSCOPY (EGD) WITH PROPOFOL N/A 04/15/2018   Procedure: ESOPHAGOGASTRODUODENOSCOPY (EGD) WITH  PROPOFOL;  Surgeon: Toney Reil, MD;  Location: [facility] ENDOSCOPY;  Service: Gastroenterology;  Laterality: N/A;   FOOT SURGERY Right    X2   SHOULDER SURGERY Right    WRIST SURGERY Bilateral     Family History  Problem Relation Age of Onset   Diabetes Mother    Cancer Mother        uterine   Mental illness Father    Obesity Daughter    Diabetes Maternal Grandmother    Heart disease Brother    Obesity Daughter     Social History   Tobacco Use   Smoking status: Never Smoker   Smokeless tobacco: Never Used  Substance Use Topics   Alcohol use: No    Alcohol/week: 0.0 standard drinks     Current Outpatient Medications:    allopurinol (ZYLOPRIM) 100 MG tablet, Take 1 tablet (100 mg total) by mouth 2 (two) times daily., Disp: 180 tablet, Rfl: 1   atorvastatin (LIPITOR) 40 MG tablet, Take 1 tablet (40 mg total) by mouth at bedtime., Disp: 90 tablet, Rfl: 1   blood glucose meter kit and supplies, Dispense based on patient and insurance preference. Use up to four times daily as directed. (FOR ICD-10 E10.9, E11.9)., Disp: 1 each, Rfl: 0   clotrimazole (LOTRIMIN) 1 % cream, Apply 1 application topically 2 (two) times daily., Disp: 30 g, Rfl: 0   fenofibrate (TRICOR) 145 MG tablet, Take 1 tablet (145 mg total) by mouth daily., Disp: 90 tablet, Rfl: 1   hydrocortisone cream 0.5 %, Apply 1 application topically 2 (two) times daily., Disp: 30 g, Rfl: 0   losartan (COZAAR) 100 MG tablet, Take 1 tablet (100 mg total) by mouth daily., Disp: 90 tablet, Rfl: 1   metFORMIN (GLUCOPHAGE-XR) 750 MG 24 hr tablet, Take 1 tablet (750 mg total) by mouth 2 (two) times daily. (Patient taking differently: Take 1,500 mg by mouth daily. ), Disp: 180 tablet, Rfl: 1   Multiple Vitamin (MULTIVITAMIN WITH MINERALS) TABS tablet, Take 1 tablet by mouth daily., Disp: , Rfl:    ONETOUCH ULTRA test strip, TEST UP TO FOUR TIMES A DAY AS DIRECTED, Disp: 300 strip, Rfl: 0   potassium  chloride SA (K-DUR) 20 MEQ tablet, TAKE 1 TABLET(20 MEQ) BY MOUTH DAILY (Patient taking differently: Take 20 mEq by mouth daily as needed. Takes with lasix), Disp: 90 tablet, Rfl: 0   venlafaxine XR (EFFEXOR-XR) 150 MG 24 hr capsule, Take 1 capsule (150 mg total) by mouth daily with breakfast., Disp: 90 capsule, Rfl: 1   Vitamin D, Ergocalciferol, (DRISDOL) 1.25 MG (50000 UNIT) CAPS capsule, Take 1 capsule (50,000 Units total) by mouth every 7 (seven) days., Disp: 12 capsule, Rfl: 1   furosemide (LASIX) 40 MG tablet, Daily (Patient not taking: Reported on 01/09/2020), Disp: 90 tablet, Rfl: 1  Allergies  Allergen Reactions   Abilify [Aripiprazole] Shortness Of Breath    Other reaction(s): Difficulty breathing   Invokana [Canagliflozin] Itching    Yeast infections   Metformin  And Related     Diarrhea, but doing well with XR formulation     Lovaza [Omega-3-Acid Ethyl Esters] Other (See Comments)    Burping     I personally reviewed active problem list, medication list, allergies, family history, social history, health maintenance, notes from last encounter with the patient/caregiver today.   ROS  Constitutional: Negative for fever or weight change.  Respiratory: Negative for cough and shortness of breath.  She has noticed some nasal congestion, advised to try saline spray and otc medication Cardiovascular: Negative for chest pain or palpitations.  Gastrointestinal: Negative for abdominal pain, no bowel changes.  Musculoskeletal: Negative for gait problem or joint swelling.  Skin: Negative for rash.  Neurological: Negative for dizziness or headache.  No other specific complaints in a complete review of systems (except as listed in HPI above).  Objective  Vitals:   01/09/20 1200  BP: 118/76  Pulse: 96  Resp: 18  Temp: 97.6 F (36.4 C)  TempSrc: Oral  SpO2: 95%  Weight: 276 lb 11.2 oz (125.5 kg)  Height: _0  (1.727 m)    Body mass index is 42.07 kg/m.  Physical  Exam  Constitutional: Patient appears well-developed and well-nourished. Obese No distress.  HEENT: head atraumatic, normocephalic, pupils equal and reactive to light,neck supple Cardiovascular: Normal rate, regular rhythm and normal heart sounds.  No murmur heard. Trace BLE edema. Pulmonary/Chest: Effort normal and breath sounds normal. No respiratory distress. Abdominal: Soft.  There is no tenderness. Psychiatric: Patient has a normal mood and affect. behavior is normal. Judgment and thought content normal.  Recent Results (from the past 2160 hour(s))  POCT HgB A1C     Status: Abnormal   Collection Time: 01/09/20 12:06 PM  Result Value Ref Range   Hemoglobin A1C 8.0 (A) 4.0 - 5.6 %   HbA1c POC (<> result, manual entry)     HbA1c, POC (prediabetic range)     HbA1c, POC (controlled diabetic range)      Diabetic Foot Exam: Diabetic Foot Exam - Simple   Simple Foot Form Diabetic Foot exam was performed with the following findings: Yes 01/09/2020 12:19 PM  Visual Inspection See comments: Yes Sensation Testing Intact to touch and monofilament testing bilaterally: Yes Pulse Check Posterior Tibialis and Dorsalis pulse intact bilaterally: Yes Comments Dry feet, some pilling, callus formation      PHQ2/9: Depression screen [facility] 2/9 01/09/2020 09/07/2019 09/07/2019 05/02/2019 04/21/2019  Decreased Interest 1 2 0 0 0  Down, Depressed, Hopeless 1 1 0 0 0  PHQ - 2 Score 2 3 0 0 0  Altered sleeping 2 2 0 0 0  Tired, decreased energy 2 2 0 0 0  Change in appetite 1 0 0 0 0  Feeling bad or failure about yourself  1 1 0 0 0  Trouble concentrating 0 0 0 0 0  Moving slowly or fidgety/restless 0 0 0 0 0  Suicidal thoughts 0 0 0 0 0  PHQ-9 Score 8 8 0 0 0  Difficult doing work/chores Somewhat difficult Not difficult at all - - Not difficult at all  Some recent data might be hidden    phq 9 is positive   Fall Risk: Fall Risk  01/09/2020 09/07/2019 05/02/2019 04/21/2019 03/18/2019  Falls in  the past year? 1 0 0 0 0  Number falls in past yr: 0 0 0 0 0  Injury with Fall? 0 0 0 0 0  Risk for fall due to : - - - - -  Functional Status Survey: Is the patient deaf or have difficulty hearing?: No Does the patient have difficulty seeing, even when wearing glasses/contacts?: No Does the patient have difficulty concentrating, remembering, or making decisions?: No Does the patient have difficulty walking or climbing stairs?: Yes Does the patient have difficulty dressing or bathing?: No Does the patient have difficulty doing errands alone such as visiting a doctor's office or shopping?: No    Assessment & Plan  1. Controlled type 2 diabetes mellitus with neuropathy (HCC)  - POCT HgB A1C - HM Diabetes Foot Exam - glipiZIDE (GLUCOTROL XL) 5 MG 24 hr tablet; Take 1 tablet (5 mg total) by mouth daily with breakfast.  Dispense: 90 tablet; Refill: 1 - Microalbumin / creatinine urine ratio  2. Controlled gout  - allopurinol (ZYLOPRIM) 100 MG tablet; Take 1 tablet (100 mg total) by mouth 2 (two) times daily.  Dispense: 180 tablet; Refill: 1  3. Mixed dyslipidemia  - atorvastatin (LIPITOR) 40 MG tablet; Take 1 tablet (40 mg total) by mouth at bedtime.  Dispense: 90 tablet; Refill: 1  4. Dyslipidemia associated with type 2 diabetes mellitus (HCC)  - atorvastatin (LIPITOR) 40 MG tablet; Take 1 tablet (40 mg total) by mouth at bedtime.  Dispense: 90 tablet; Refill: 1 - Lipid panel  5. Benign essential HTN  - losartan (COZAAR) 100 MG tablet; Take 1 tablet (100 mg total) by mouth daily.  Dispense: 90 tablet; Refill: 1 - CBC with Differential/Platelet - Comprehensive metabolic panel  6. Major depression, recurrent, chronic (HCC)  - venlafaxine XR (EFFEXOR-XR) 150 MG 24 hr capsule; Take 1 capsule (150 mg total) by mouth daily with breakfast.  Dispense: 90 capsule; Refill: 1  7. Vitamin D deficiency  - Vitamin D, Ergocalciferol, (DRISDOL) 1.25 MG (50000 UNIT) CAPS capsule; Take  1 capsule (50,000 Units total) by mouth every 7 (seven) days.  Dispense: 12 capsule; Refill: 1  8. Vitamin B12 deficiency   9. Morbid obesity, unspecified obesity type [facility])   Discussed with the patient the risk posed by an increased BMI. Discussed importance of portion control, calorie counting and at least 150 minutes of physical activity weekly. Avoid sweet beverages and drink more water. Eat at least 6 servings of fruit and vegetables daily

## 2020-01-26 ENCOUNTER — Telehealth: Payer: Self-pay | Admitting: *Deleted

## 2020-01-26 NOTE — Chronic Care Management (AMB) (Signed)
  Chronic Care Management   Outreach Note  01/26/2020 Name: [name] MRN: [id number] DOB: [birth year]  Tina Short is a 64 y.o. year old female who is a primary care patient of Steele Sizer, MD. I reached out to Gwenyth Ober by phone today in response to a referral sent by Ms. [employer] health plan.     An unsuccessful telephone outreach was attempted today. The patient was referred to the case management team for assistance with care management and care coordination.   Follow Up Plan: A HIPAA compliant phone message was left for the patient providing contact information and requesting a return call. The care management team will reach out to the patient again over the next 7 days. If patient returns call to provider office, please advise to call [place] at [phone].  [place] Management  Direct Dial: [phone]

## 2020-01-31 NOTE — Chronic Care Management (AMB) (Signed)
  Chronic Care Management   Outreach Note  01/31/2020 Name: Tina Short MRN: [id number] DOB: 10-29-1955  Cherry [place] Boan is a 64 y.o. year old female who is a primary care patient of Steele Sizer, MD. I reached out to Gwenyth Ober by phone today in response to a referral sent by Ms. [employer] health plan.     A second unsuccessful telephone outreach was attempted today. The patient was referred to the case management team for assistance with care management and care coordination.   Follow Up Plan: A HIPAA compliant phone message was left for the patient providing contact information and requesting a return call. The care management team will reach out to the patient again over the next 7 days. If patient returns call to provider office, please advise to call [place] at [phone].  [place] Management

## 2020-02-07 NOTE — Chronic Care Management (AMB) (Signed)
°  Chronic Care Management   Outreach Note  02/07/2020 Name: Tina Short MRN: [id number] DOB: [birth year]  Tina Short is a 64 y.o. year old female who is a primary care patient of Alba Cory, MD. I reached out to [name] by phone today in response to a referral sent by [name] health plan.     Third unsuccessful telephone outreach was attempted today. The patient was referred to the case management team for assistance with care management and care coordination. The patient's primary care provider has been notified of our unsuccessful attempts to make or maintain contact with the patient. The care management team is pleased to engage with this patient at any time in the future should he/she be interested in assistance from the care management team.   Follow Up Plan:  A HIPAA compliant phone message was left for the patient providing contact information and requesting a return call. If patient returns call to provider office, please advise to call [facility] Management Care Guide Gwenevere Ghazi at [phone].  Gwenevere Ghazi  Care Guide, Embedded Care Coordination [facility] Management

## 2020-03-24 ENCOUNTER — Other Ambulatory Visit: Payer: Self-pay | Admitting: Family Medicine

## 2020-03-24 DIAGNOSIS — E1140 Type 2 diabetes mellitus with diabetic neuropathy, unspecified: Secondary | ICD-10-CM

## 2020-03-24 DIAGNOSIS — IMO0002 Reserved for concepts with insufficient information to code with codable children: Secondary | ICD-10-CM

## 2020-03-24 DIAGNOSIS — E1129 Type 2 diabetes mellitus with other diabetic kidney complication: Secondary | ICD-10-CM

## 2020-03-26 ENCOUNTER — Other Ambulatory Visit: Payer: Self-pay | Admitting: Family Medicine

## 2020-03-27 ENCOUNTER — Other Ambulatory Visit: Payer: Self-pay | Admitting: Family Medicine

## 2020-05-05 ENCOUNTER — Other Ambulatory Visit: Payer: Self-pay | Admitting: Family Medicine

## 2020-05-05 NOTE — Telephone Encounter (Signed)
Approved per protocol.  Requested Prescriptions  Pending Prescriptions Disp Refills  . ONETOUCH ULTRA test strip [employer] Med Name: [place] TESTST(NEW)100] 300 strip 0    Sig: TEST UP TO FOUR TIMES A DAY AS DIRECTED     Endocrinology: Diabetes - Testing Supplies Passed - 05/05/2020  9:10 AM      Passed - Valid encounter within last 12 months    Recent Outpatient Visits          3 months ago Controlled type 2 diabetes mellitus with neuropathy [facility])   [facility] [place], Drue Stager, MD   8 months ago Controlled type 2 diabetes mellitus with neuropathy [facility])   [facility] Steele Sizer, MD   1 year ago Thrombocytopenia [facility])   [facility] Steele Sizer, MD   1 year ago Cellulitis of left lower extremity   [place], MD   1 year ago Type 2 diabetes mellitus, uncontrolled, with neuropathy [facility])   [facility] Steele Sizer, MD      Future Appointments            In 3 days Steele Sizer, MD [facility], [facility]

## 2020-05-07 NOTE — Progress Notes (Addendum)
Name: Tina Short   MRN: [id number]    DOB: March 02, 1955   Date:05/08/2020       Progress Note  Subjective  Chief Complaint  Follow Up  HPI  DMII: she has a history of DM for many years, A1C was 6.8% and last visit was up to 8 % but today we are unable to check it in house, she will go to [facility] after her visit. , she was on Metformin, but could not afford it and is now on Glipizide, she states she feels hungry all the time. She denies polydipsia, nopolyuria. She takes statin, she stopped taking Lovaza ( due to size and bad taste)  and is back on fenofibrate ,Losartan ( [facility])for microabuminuriaShe has neuropathy,she denies pain just has numbness and seems to be doing better. .She cannottolerate SGL-2 agonist because it caused yeast infection . She has been trying to eat healthy, but has been craving more carb lately   Morbid obesity:weight is up 8 lbs since last visit . She was on Ozempic 0.5 mg dose, however stopped due to cost.   Major Depression: Her father committed suicide in his 107's and there are other family members with history of psychiatric illness. She denies suicidal thoughts, but would be fine if she died, she states that is a chronic feeling, she states she would never do it. No planning. She still feels hurt about the break down of her previous church - where she was a member for almost 5 years. Phq 9 is is much higher. She states recently she went three days without sleep, she also states she did very well on Ability in the past but stopped because she was having sob with activity. Discussed trying another mood stabilizer . She has been crying more than usual also. Daughter moved in with them and two grand-daughters.   OSA: she uses CPAP every night. She had a repeat study, compliant withtreatment.She states that if she does not use CPAP she feels shaky and has a headache when she wakes up.Unchanged   B12 deficiency: shehas been out supplements but will get some  more today   Gout: no recent episodes, she is now on Allopurinol twice daily and denies side effects of medication, she is compliant with medication. No recent flares  . Unchanged   Vitamin D:she has rx vitamin D at home , she has been compliant   HTN: she is taking medications, bp is at goal, no chest pain, palpitation, she was more active right after retirement but has been feeling depressed again and has been sitting down a lot, but elevating her leg and has not noticed increase in swelling   OA knee: worse on right side, daily pain, worse with activity or standing, she was seen at [facility] in the past and ready to go back, willing to have knee replacement   Patient Active Problem List   Diagnosis Date Noted  . Esophageal dysphagia   . Colon cancer screening 04/30/2016  . Major depression, recurrent, chronic ([place]) 10/30/2015  . Dyslipidemia associated with type 2 diabetes mellitus ([place]) 10/30/2015  . Vitamin D deficiency 07/27/2015  . Vitamin B12 deficiency 07/27/2015  . History of anemia 09/12/2014  . Carpal tunnel syndrome 09/12/2014  . Obstructive sleep apnea 09/12/2014  . Edema of extremities 09/12/2014  . Gout 09/12/2014  . Morbid obesity, unspecified obesity type ([place]) 11/15/2009  . Benign essential HTN 10/07/2006  . Type 2 diabetes mellitus, uncontrolled, with neuropathy ([place]) 10/07/2006  . Hyperlipidemia 10/07/2006  Past Surgical History:  Procedure Laterality Date  . ABDOMINAL HYSTERECTOMY    . COLONOSCOPY    . COLONOSCOPY WITH PROPOFOL N/A 05/14/2016   Procedure: COLONOSCOPY WITH PROPOFOL;  Surgeon: [name], MD;  Location[facility] ENDOSCOPY;  Service: Endoscopy;  Laterality: N/A;  . COLONOSCOPY WITH PROPOFOL N/A 04/15/2018   Procedure: COLONOSCOPY WITH PROPOFOL;  Surgeon: Lin Landsman, MD;  Location: [facility] ENDOSCOPY;  Service: Gastroenterology;  Laterality: N/A;  . ESOPHAGOGASTRODUODENOSCOPY (EGD) WITH PROPOFOL N/A 04/15/2018   Procedure:  ESOPHAGOGASTRODUODENOSCOPY (EGD) WITH PROPOFOL;  Surgeon: Lin Landsman, MD;  Location: [facility] ENDOSCOPY;  Service: Gastroenterology;  Laterality: N/A;  . FOOT SURGERY Right    X2  . SHOULDER SURGERY Right   . WRIST SURGERY Bilateral     Family History  Problem Relation Age of Onset  . Diabetes Mother   . Cancer Mother        uterine  . Mental illness Father   . Obesity Daughter   . Diabetes Maternal Grandmother   . Heart disease Brother   . Obesity Daughter     Social History   Tobacco Use  . Smoking status: Never Smoker  . Smokeless tobacco: Never Used  Substance Use Topics  . Alcohol use: No    Alcohol/week: 0.0 standard drinks     Current Outpatient Medications:  .  allopurinol (ZYLOPRIM) 100 MG tablet, Take 1 tablet (100 mg total) by mouth 2 (two) times daily., Disp: 180 tablet, Rfl: 1 .  atorvastatin (LIPITOR) 40 MG tablet, Take 1 tablet (40 mg total) by mouth at bedtime., Disp: 90 tablet, Rfl: 1 .  blood glucose meter kit and supplies, Dispense based on patient and insurance preference. Use up to four times daily as directed. (FOR ICD-10 E10.9, E11.9)., Disp: 1 each, Rfl: 0 .  fenofibrate (TRICOR) 145 MG tablet, TAKE 1 TABLET(145 MG) BY MOUTH DAILY, Disp: 90 tablet, Rfl: 1 .  glipiZIDE (GLUCOTROL XL) 5 MG 24 hr tablet, Take 1 tablet (5 mg total) by mouth daily with breakfast., Disp: 90 tablet, Rfl: 1 .  losartan (COZAAR) 100 MG tablet, Take 1 tablet (100 mg total) by mouth daily., Disp: 90 tablet, Rfl: 1 .  metFORMIN (GLUCOPHAGE-XR) 750 MG 24 hr tablet, TAKE 1 TABLET(750 MG) BY MOUTH TWICE DAILY, Disp: 180 tablet, Rfl: 1 .  Multiple Vitamin (MULTIVITAMIN WITH MINERALS) TABS tablet, Take 1 tablet by mouth daily., Disp: , Rfl:  .  ONETOUCH ULTRA test strip, TEST UP TO FOUR TIMES A DAY AS DIRECTED, Disp: 300 strip, Rfl: 0 .  venlafaxine XR (EFFEXOR-XR) 150 MG 24 hr capsule, Take 1 capsule (150 mg total) by mouth daily with breakfast., Disp: 90 capsule, Rfl: 1 .   vitamin B-12 (CYANOCOBALAMIN) 500 MCG tablet, Take 500 mcg by mouth daily., Disp: , Rfl:  .  Vitamin D, Ergocalciferol, (DRISDOL) 1.25 MG (50000 UNIT) CAPS capsule, Take 1 capsule (50,000 Units total) by mouth every 7 (seven) days., Disp: 12 capsule, Rfl: 1 .  clotrimazole (LOTRIMIN) 1 % cream, Apply 1 application topically 2 (two) times daily. (Patient not taking: Reported on 05/08/2020), Disp: 30 g, Rfl: 0 .  furosemide (LASIX) 40 MG tablet, Daily (Patient not taking: Reported on 05/08/2020), Disp: 90 tablet, Rfl: 1 .  hydrocortisone cream 0.5 %, Apply 1 application topically 2 (two) times daily. (Patient not taking: Reported on 05/08/2020), Disp: 30 g, Rfl: 0 .  potassium chloride SA (K-DUR) 20 MEQ tablet, TAKE 1 TABLET(20 MEQ) BY MOUTH DAILY (Patient not taking: Reported on  05/08/2020), Disp: 90 tablet, Rfl: 0  Allergies  Allergen Reactions  . Abilify [Aripiprazole] Shortness Of Breath    Other reaction(s): Difficulty breathing  . Invokana [Canagliflozin] Itching    Yeast infections  . Metformin And Related     Diarrhea, but doing well with XR formulation    . Lovaza [Omega-3-Acid Ethyl Esters] Other (See Comments)    Burping     I personally reviewed active problem list, medication list, allergies, family history, social history, health maintenance with the patient/caregiver today.   ROS  Constitutional: Negative for fever , positive for  weight change.  Respiratory: Negative for cough and shortness of breath.   Cardiovascular: Negative for chest pain or palpitations.  Gastrointestinal: Negative for abdominal pain, no bowel changes.  Musculoskeletal:positive for gait problem but no  joint swelling.  Skin: Negative for rash.  Neurological: Negative for dizziness or headache.  No other specific complaints in a complete review of systems (except as listed in HPI above).  Objective  Vitals:   05/08/20 1108  BP: 132/74  Pulse: 95  Resp: 16  Temp: 98.2 F (36.8 C)  TempSrc: Oral   SpO2: 97%  Weight: 284 lb (128.8 kg)  Height: _0  (1.727 m)    Body mass index is 43.18 kg/m.  Physical Exam  Constitutional: Patient appears well-developed and well-nourished. Obese  No distress.  HEENT: head atraumatic, normocephalic, pupils equal and reactive to light,  neck supple Cardiovascular: Normal rate, regular rhythm and normal heart sounds.  No murmur heard. No BLE edema. Pulmonary/Chest: Effort normal and breath sounds normal. No respiratory distress. Abdominal: Soft.  There is no tenderness. Muscular skeletal: crepitus with extension of both knees Psychiatric: Patient has a normal mood and affect. behavior is normal. Judgment and thought content normal.   PHQ2/9: Depression screen [facility] 2/9 05/08/2020 01/09/2020 09/07/2019 09/07/2019 05/02/2019  Decreased Interest _1 0 0  Down, Depressed, Hopeless _2 0 0  PHQ - 2 Score _3 0 0  Altered sleeping _4 0 0  Tired, decreased energy _5 0 0  Change in appetite 3 1 0 0 0  Feeling bad or failure about yourself  _6 0 0  Trouble concentrating 0 0 0 0 0  Moving slowly or fidgety/restless 0 0 0 0 0  Suicidal thoughts 0 0 0 0 0  PHQ-9 Score _7 0 0  Difficult doing work/chores - Somewhat difficult Not difficult at all - -  Some recent data might be hidden    phq 9 is positive   Fall Risk: Fall Risk  05/08/2020 01/09/2020 09/07/2019 05/02/2019 04/21/2019  Falls in the past year? 0 1 0 0 0  Number falls in past yr: 0 0 0 0 0  Injury with Fall? 0 0 0 0 0  Risk for fall due to : - - - - -     Functional Status Survey: Is the patient deaf or have difficulty hearing?: No Does the patient have difficulty seeing, even when wearing glasses/contacts?: No Does the patient have difficulty concentrating, remembering, or making decisions?: No Does the patient have difficulty walking or climbing stairs?: Yes Does the patient have difficulty dressing or bathing?: No Does the patient have difficulty doing errands  alone such as visiting a doctor's office or shopping?: No    Assessment & Plan  1. Controlled type 2 diabetes mellitus with neuropathy (HCC)  - HgB A1c - AMB Referral to [facility]  Coordinaton - she did well on GLP-1 agonist, placing referral to see if she can qualify for assistance   2. Dyslipidemia associated with type 2 diabetes mellitus (HCC)  - AMB Referral to [facility]  3. Benign essential HTN   4. Vitamin B12 deficiency   5. Vitamin D deficiency   6. Morbid obesity, unspecified obesity type ([place])  - AMB Referral to [facility] ( for the nurse to assist with dietary changes and weight loss )   7. Controlled gout   8. Mood disorder (HCC)  - divalproex (DEPAKOTE ER) 250 MG 24 hr tablet; Take 1 tablet (250 mg total) by mouth daily.  Dispense: 90 tablet; Refill: 1  9. Major depression, recurrent, chronic ([place])   10. Primary osteoarthritis of both knees  - Ambulatory referral to Orthopedic Surgery

## 2020-05-08 ENCOUNTER — Encounter: Payer: Self-pay | Admitting: Family Medicine

## 2020-05-08 ENCOUNTER — Ambulatory Visit (INDEPENDENT_AMBULATORY_CARE_PROVIDER_SITE_OTHER): Payer: PPO | Admitting: Family Medicine

## 2020-05-08 ENCOUNTER — Other Ambulatory Visit: Payer: Self-pay

## 2020-05-08 VITALS — BP 132/74 | HR 95 | Temp 98.2°F | Resp 16 | Ht 68.0 in | Wt 284.0 lb

## 2020-05-08 DIAGNOSIS — E1140 Type 2 diabetes mellitus with diabetic neuropathy, unspecified: Secondary | ICD-10-CM | POA: Diagnosis not present

## 2020-05-08 DIAGNOSIS — F339 Major depressive disorder, recurrent, unspecified: Secondary | ICD-10-CM

## 2020-05-08 DIAGNOSIS — M170 Bilateral primary osteoarthritis of knee: Secondary | ICD-10-CM

## 2020-05-08 DIAGNOSIS — E559 Vitamin D deficiency, unspecified: Secondary | ICD-10-CM

## 2020-05-08 DIAGNOSIS — E538 Deficiency of other specified B group vitamins: Secondary | ICD-10-CM

## 2020-05-08 DIAGNOSIS — F39 Unspecified mood [affective] disorder: Secondary | ICD-10-CM | POA: Diagnosis not present

## 2020-05-08 DIAGNOSIS — M109 Gout, unspecified: Secondary | ICD-10-CM | POA: Diagnosis not present

## 2020-05-08 DIAGNOSIS — I10 Essential (primary) hypertension: Secondary | ICD-10-CM | POA: Diagnosis not present

## 2020-05-08 DIAGNOSIS — E1169 Type 2 diabetes mellitus with other specified complication: Secondary | ICD-10-CM | POA: Diagnosis not present

## 2020-05-08 DIAGNOSIS — E785 Hyperlipidemia, unspecified: Secondary | ICD-10-CM | POA: Diagnosis not present

## 2020-05-08 MED ORDER — DIVALPROEX SODIUM ER 250 MG PO TB24: 250.0000 mg | ORAL_TABLET | Freq: Every day | ORAL | 1 refills | Status: DC

## 2020-05-08 NOTE — Addendum Note (Signed)
Addended by: Steele Sizer F on: 05/08/2020 11:54 AM   Modules accepted: Orders

## 2020-05-09 ENCOUNTER — Telehealth: Payer: Self-pay | Admitting: *Deleted

## 2020-05-09 LAB — COMPREHENSIVE METABOLIC PANEL
ALT: 31 IU/L (ref 0–32)
AST: 39 IU/L (ref 0–40)
Albumin/Globulin Ratio: 1.6 (ref 1.2–2.2)
Albumin: 4.6 g/dL (ref 3.8–4.8)
Alkaline Phosphatase: 60 IU/L (ref 44–121)
BUN/Creatinine Ratio: 19 (ref 12–28)
BUN: 12 mg/dL (ref 8–27)
Bilirubin Total: 0.8 mg/dL (ref 0.0–1.2)
CO2: 19 mmol/L — ABNORMAL LOW (ref 20–29)
Calcium: 9.6 mg/dL (ref 8.7–10.3)
Chloride: 103 mmol/L (ref 96–106)
Creatinine, Ser: 0.63 mg/dL (ref 0.57–1.00)
Globulin, Total: 2.9 g/dL (ref 1.5–4.5)
Glucose: 209 mg/dL — ABNORMAL HIGH (ref 65–99)
Potassium: 4.3 mmol/L (ref 3.5–5.2)
Sodium: 141 mmol/L (ref 134–144)
Total Protein: 7.5 g/dL (ref 6.0–8.5)
eGFR: 98 mL/min/{1.73_m2} (ref >59)

## 2020-05-09 LAB — CBC WITH DIFFERENTIAL/PLATELET
Basophils Absolute: 0 10*3/uL (ref 0.0–0.2)
Basos: 1 %
EOS (ABSOLUTE): 0.1 10*3/uL (ref 0.0–0.4)
Eos: 1 %
Hematocrit: 42.5 % (ref 34.0–46.6)
Hemoglobin: 14.5 g/dL (ref 11.1–15.9)
Immature Grans (Abs): 0 10*3/uL (ref 0.0–0.1)
Immature Granulocytes: 0 %
Lymphocytes Absolute: 2 10*3/uL (ref 0.7–3.1)
Lymphs: 36 %
MCH: 31.9 pg (ref 26.6–33.0)
MCHC: 34.1 g/dL (ref 31.5–35.7)
MCV: 93 fL (ref 79–97)
Monocytes Absolute: 0.4 10*3/uL (ref 0.1–0.9)
Monocytes: 7 %
Neutrophils Absolute: 3 10*3/uL (ref 1.4–7.0)
Neutrophils: 55 %
Platelets: 185 10*3/uL (ref 150–450)
RBC: 4.55 x10E6/uL (ref 3.77–5.28)
RDW: 13.1 % (ref 11.7–15.4)
WBC: 5.5 10*3/uL (ref 3.4–10.8)

## 2020-05-09 LAB — MICROALBUMIN / CREATININE URINE RATIO
Creatinine, Urine: 144 mg/dL
Microalb/Creat Ratio: 17 mg/g creat (ref 0–29)
Microalbumin, Urine: 24.9 ug/mL

## 2020-05-09 LAB — LIPID PANEL
Chol/HDL Ratio: 6.1 ratio — ABNORMAL HIGH (ref 0.0–4.4)
Cholesterol, Total: 189 mg/dL (ref 100–199)
HDL: 31 mg/dL — ABNORMAL LOW (ref >39)
LDL Chol Calc (NIH): 96 mg/dL (ref 0–99)
Triglycerides: 368 mg/dL — ABNORMAL HIGH (ref 0–149)
VLDL Cholesterol Cal: 62 mg/dL — ABNORMAL HIGH (ref 5–40)

## 2020-05-09 LAB — HEMOGLOBIN A1C
Est. average glucose Bld gHb Est-mCnc: 174 mg/dL
Hgb A1c MFr Bld: 7.7 % — ABNORMAL HIGH (ref 4.8–5.6)

## 2020-05-09 NOTE — Chronic Care Management (AMB) (Signed)
  Chronic Care Management   Note  05/09/2020 Name: SAVINA OLSHEFSKI MRN: [id number] DOB: [birth year]  Landree Fernholz Godsil is a 65 y.o. year old female who is a primary care patient of Steele Sizer, MD. I reached out to Raiford Noble by phone today in response to a referral sent by Ms. Lezlie Octave Garcialopez's PCP, Dr. Ancil Boozer.     Ms. Lortz was given information about Chronic Care Management services today including:  1. CCM service includes personalized support from designated clinical staff supervised by her physician, including individualized plan of care and coordination with other care providers 2. 24/7 contact phone numbers for assistance for urgent and routine care needs. 3. Service will only be billed when office clinical staff spend 20 minutes or more in a month to coordinate care. 4. Only one practitioner may furnish and bill the service in a calendar month. 5. The patient may stop CCM services at any time (effective at the end of the month) by phone call to the office staff. 6. The patient will be responsible for cost sharing (co-pay) of up to 20% of the service fee (after annual deductible is met).  Patient agreed to services and verbal consent obtained.   Follow up plan: Telephone appointment with care management team member scheduled for: RNCM  on 0/10/4074, Licensed Clinical SW on  05/21/2020 and PharmD on 05/23/2020   [place] Management  Direct Dial: [phone]

## 2020-05-14 DIAGNOSIS — M13861 Other specified arthritis, right knee: Secondary | ICD-10-CM | POA: Diagnosis not present

## 2020-05-14 DIAGNOSIS — M13862 Other specified arthritis, left knee: Secondary | ICD-10-CM | POA: Diagnosis not present

## 2020-05-16 ENCOUNTER — Telehealth: Payer: Self-pay

## 2020-05-16 NOTE — Progress Notes (Signed)
Chronic Care Management Pharmacy Assistant   Name: Tina Short  MRN: [id number] DOB: 26-Sep-1955   Reason for Encounter: Medication Review/initial questions for initial visit with clinical pharmacist on 05/23/2020.   Recent office visits:  None ID   Recent consult visits:  None ID  Hospital visits:  None in previous 6 months  Medications: Outpatient Encounter Medications as of 05/16/2020  Medication Sig  . allopurinol (ZYLOPRIM) 100 MG tablet Take 1 tablet (100 mg total) by mouth 2 (two) times daily.  Marland Kitchen atorvastatin (LIPITOR) 40 MG tablet Take 1 tablet (40 mg total) by mouth at bedtime.  . blood glucose meter kit and supplies Dispense based on patient and insurance preference. Use up to four times daily as directed. (FOR ICD-10 E10.9, E11.9).  . clotrimazole (LOTRIMIN) 1 % cream Apply 1 application topically 2 (two) times daily. (Patient not taking: Reported on 05/08/2020)  . divalproex (DEPAKOTE ER) 250 MG 24 hr tablet Take 1 tablet (250 mg total) by mouth daily.  . fenofibrate (TRICOR) 145 MG tablet TAKE 1 TABLET(145 MG) BY MOUTH DAILY  . furosemide (LASIX) 40 MG tablet Daily (Patient not taking: Reported on 05/08/2020)  . glipiZIDE (GLUCOTROL XL) 5 MG 24 hr tablet Take 1 tablet (5 mg total) by mouth daily with breakfast.  . hydrocortisone cream 0.5 % Apply 1 application topically 2 (two) times daily. (Patient not taking: Reported on 05/08/2020)  . losartan (COZAAR) 100 MG tablet Take 1 tablet (100 mg total) by mouth daily.  . metFORMIN (GLUCOPHAGE-XR) 750 MG 24 hr tablet TAKE 1 TABLET(750 MG) BY MOUTH TWICE DAILY  . Multiple Vitamin (MULTIVITAMIN WITH MINERALS) TABS tablet Take 1 tablet by mouth daily.  Glory Rosebush ULTRA test strip TEST UP TO FOUR TIMES A DAY AS DIRECTED  . potassium chloride SA (K-DUR) 20 MEQ tablet TAKE 1 TABLET(20 MEQ) BY MOUTH DAILY (Patient not taking: Reported on 05/08/2020)  . venlafaxine XR (EFFEXOR-XR) 150 MG 24 hr capsule Take 1 capsule (150 mg total) by  mouth daily with breakfast.  . vitamin B-12 (CYANOCOBALAMIN) 500 MCG tablet Take 500 mcg by mouth daily.  . Vitamin D, Ergocalciferol, (DRISDOL) 1.25 MG (50000 UNIT) CAPS capsule Take 1 capsule (50,000 Units total) by mouth every 7 (seven) days.   No facility-administered encounter medications on file as of 05/16/2020.    Star Rating Drugs: Atorvastatin 40 mg last filled on 12/08/2019 for 90 Day supply at [facility] Glipizide 5 mg last filled on 04/18/2020 for 90 Day supply at [facility] Losartan 100 mg last filled on 03/10/2020 for 90 day supply at [facility] Metformin 750 mg last filled on 03/27/2020 for 90 day supply at [facility]  Have you seen any other providers since your last visit? no Any changes in your medications or health? no Any side effects from any medications? Yes  Patient states she believes she is having sides effects from melatonin.Patient states when she first started taking melatonin she was sleeping good,but now she becomes real dizzy when she wake up and try's to stand.   Do you have an symptoms or problems not managed by your medications? no Any concerns about your health right now? Yes  Patient states she was prescribe divalproex but has not started taking it because she is afraid of the side effects.  Patient reports she has been on Prozac for 20 plus years and feels her depression getting worst.Patient states she use to take amplify before and had a side effect where she could not breathe.Patient is asking  is there something else she can add or can she switch her medication to something else. Has your provider asked that you check blood pressure, blood sugar, or follow special diet at home? Yes  Patient states she does not check her blood pressure at home because she only has problem with her kidneys not her blood pressure.  Patient states she barley checks her blood sugar at home. Do you get any type of exercise on a regular basis? No  Patient states she does  not exercise because of her depression, and needing her knee replacement done. Can you think of a goal you would like to reach for your health? None ID Do you have any problems getting your medications? Yes  Patient states she use to take Ozempic in the past but had to stop due to the price.Patient states Ozempic help her a lot with appetite, and would like to try to get back on it if she can afford it. Is there anything that you would like to discuss during the appointment?   Medications  Depression  Patient Assistance  Please bring medications and supplements to appointment   [place] Pharmacist Assistant [phone]

## 2020-05-17 ENCOUNTER — Ambulatory Visit (INDEPENDENT_AMBULATORY_CARE_PROVIDER_SITE_OTHER): Payer: PPO

## 2020-05-17 DIAGNOSIS — M170 Bilateral primary osteoarthritis of knee: Secondary | ICD-10-CM | POA: Diagnosis not present

## 2020-05-17 DIAGNOSIS — I152 Hypertension secondary to endocrine disorders: Secondary | ICD-10-CM

## 2020-05-17 DIAGNOSIS — F339 Major depressive disorder, recurrent, unspecified: Secondary | ICD-10-CM

## 2020-05-17 DIAGNOSIS — E1140 Type 2 diabetes mellitus with diabetic neuropathy, unspecified: Secondary | ICD-10-CM | POA: Diagnosis not present

## 2020-05-17 DIAGNOSIS — IMO0002 Reserved for concepts with insufficient information to code with codable children: Secondary | ICD-10-CM

## 2020-05-17 DIAGNOSIS — E785 Hyperlipidemia, unspecified: Secondary | ICD-10-CM

## 2020-05-17 DIAGNOSIS — E1169 Type 2 diabetes mellitus with other specified complication: Secondary | ICD-10-CM

## 2020-05-17 DIAGNOSIS — E1165 Type 2 diabetes mellitus with hyperglycemia: Secondary | ICD-10-CM

## 2020-05-17 DIAGNOSIS — E1159 Type 2 diabetes mellitus with other circulatory complications: Secondary | ICD-10-CM

## 2020-05-17 NOTE — Chronic Care Management (AMB) (Signed)
Chronic Care Management   Initial Visit Note  05/17/2020 Name: Tina Short MRN: [id number] DOB: 09-Oct-1955  Primary Care Provider: Steele Sizer, MD Reason for referral : Chronic Care Management   Tina Short is a 65 y.o. year old female who is a primary care patient of Steele Sizer, MD. The CCM team was consulted for assistance with chronic disease management and care coordination.  Review of Tina Short's status, including review of consultants reports, relevant labs and test results was conducted today. Collaboration with appropriate care team members was performed as part of the comprehensive evaluation and provision of chronic care management services.    SDOH (Social Determinants of Health) assessments performed: Yes See Care Plan activities for detailed interventions related to SDOH  SDOH Interventions   Flowsheet Row Most Recent Value  SDOH Interventions   Food Insecurity Interventions Intervention Not Indicated  Transportation Interventions Intervention Not Indicated       Medications: Outpatient Encounter Medications as of 05/17/2020  Medication Sig Note  . allopurinol (ZYLOPRIM) 100 MG tablet Take 1 tablet (100 mg total) by mouth 2 (two) times daily.   Marland Kitchen atorvastatin (LIPITOR) 40 MG tablet Take 1 tablet (40 mg total) by mouth at bedtime.   . fenofibrate (TRICOR) 145 MG tablet TAKE 1 TABLET(145 MG) BY MOUTH DAILY   . glipiZIDE (GLUCOTROL XL) 5 MG 24 hr tablet Take 1 tablet (5 mg total) by mouth daily with breakfast.   . losartan (COZAAR) 100 MG tablet Take 1 tablet (100 mg total) by mouth daily.   . melatonin 5 MG TABS Take 5 mg by mouth.   . metFORMIN (GLUCOPHAGE-XR) 750 MG 24 hr tablet TAKE 1 TABLET(750 MG) BY MOUTH TWICE DAILY   . venlafaxine XR (EFFEXOR-XR) 150 MG 24 hr capsule Take 1 capsule (150 mg total) by mouth daily with breakfast.   . vitamin B-12 (CYANOCOBALAMIN) 500 MCG tablet Take 500 mcg by mouth daily.   . Vitamin D, Ergocalciferol, (DRISDOL)  1.25 MG (50000 UNIT) CAPS capsule Take 1 capsule (50,000 Units total) by mouth every 7 (seven) days.   . blood glucose meter kit and supplies Dispense based on patient and insurance preference. Use up to four times daily as directed. (FOR ICD-10 E10.9, E11.9).   . clotrimazole (LOTRIMIN) 1 % cream Apply 1 application topically 2 (two) times daily. (Patient not taking: Reported on 05/08/2020)   . divalproex (DEPAKOTE ER) 250 MG 24 hr tablet Take 1 tablet (250 mg total) by mouth daily. (Patient not taking: Reported on 05/17/2020) 05/17/2020: Reports not taking d/t concerns regarding side effects.  . furosemide (LASIX) 40 MG tablet Daily (Patient not taking: Reported on 05/08/2020) 05/17/2020: Reports taking as needed.  . hydrocortisone cream 0.5 % Apply 1 application topically 2 (two) times daily. (Patient not taking: Reported on 05/08/2020)   . Multiple Vitamin (MULTIVITAMIN WITH MINERALS) TABS tablet Take 1 tablet by mouth daily. (Patient not taking: Reported on 05/17/2020)   . ONETOUCH ULTRA test strip TEST UP TO FOUR TIMES A DAY AS DIRECTED   . potassium chloride SA (K-DUR) 20 MEQ tablet TAKE 1 TABLET(20 MEQ) BY MOUTH DAILY (Patient not taking: Reported on 05/08/2020) 05/17/2020: Reports taking only if taking Lasix   No facility-administered encounter medications on file as of 05/17/2020.     Objective:  Patient Care Plan: Diabetes Type 2 (Adult)    Problem Identified: Glycemic Management (Diabetes, Type 2)     Long-Range Goal: Glycemic Management Optimized   Start Date: 05/17/2020  Expected End  Date: 09/14/2020  Priority: High  Note:   Objective:  Lab Results  Component Value Date   HGBA1C 7.7 (H) 05/08/2020 .   Lab Results  Component Value Date   CREATININE 0.63 05/08/2020   CREATININE 0.44 04/25/2019   CREATININE 0.41 (L) 04/24/2019 .   Marland Kitchen No results found for: EGFR   Current Barriers:  . Chronic disease management support and educational needs related to Diabetes self-management   Case  Manager Clinical Goal(s):  Marland Kitchen Over the next 120 days, patient will demonstrate improved adherence to prescribed treatment plan for Diabetes management as evidenced by taking medications as prescribed, monitoring and recording of CBG, and adherence to a ADA/ carb modified diet.   Interventions:  . Collaboration with Steele Sizer, MD regarding development and update of comprehensive plan of care as evidenced by provider attestation and co-signature . Inter-disciplinary care team collaboration (see longitudinal plan of care) . Reviewed medications and importance of compliance. Reports self-managing her medications well. Encouraged to take medications as prescribed and notify her provider if unable to tolerate the prescribed regimen. She was previously taking Ozempic but reports discontinuing due to cost. Will update the CCM Pharmacist regarding possible need for medication assistance. . Discussed current plan for Diabetes management. Reviewed s/sx of hypoglycemia and hyperglycemia along with recommended interventions. She does not monitor her blood glucose levels but attempting to manage her diet. Advised to monitor fasting levels and maintain a log. Notes better glycemic and appetite control along with a 30 lb weight loss with Ozempic. She is hoping to switch back to this medication if assistance is available.  . Discussed and offered referrals for available Diabetes education classes. Declined need for additional classes. Her AIC is currently not at goal but has improved from 8.9 to 7.7%. . Discussed importance of completing recommended DM preventive care.  Reports completing recommended foot care. She is due for an eye exam and plans to schedule.   Patient Goals/Self-Care Activities -Self-administer medications as prescribed -Attend all scheduled provider appointments -Follow provider's recommendations for optimal glycemic control -Adhere to prescribed ADA/carb modified -Notify provider or care  management team with questions and new concerns as needed   Follow Up Plan:  Will follow up next month        [name] was given information about Chronic Care Management services including:  1. CCM service includes personalized support from designated clinical staff supervised by her physician, including individualized plan of care and coordination with other care providers 2. 24/7 contact phone numbers for assistance for urgent and routine care needs. 3. Service will only be billed when office clinical staff spend 20 minutes or more in a month to coordinate care. 4. Only one practitioner may furnish and bill the service in a calendar month. 5. The patient may stop CCM services at any time (effective at the end of the month) by phone call to the office staff. 6. The patient will be responsible for cost sharing (co-pay) of up to 20% of the service fee (after annual deductible is met).  Patient agreed to services and verbal consent obtained.     PLAN A member of the care management team will follow up with Mrs. Ow next month.     [name] Care [facility] [phone]

## 2020-05-21 ENCOUNTER — Ambulatory Visit: Payer: PPO | Admitting: *Deleted

## 2020-05-21 DIAGNOSIS — F339 Major depressive disorder, recurrent, unspecified: Secondary | ICD-10-CM

## 2020-05-21 DIAGNOSIS — F39 Unspecified mood [affective] disorder: Secondary | ICD-10-CM

## 2020-05-21 DIAGNOSIS — E1140 Type 2 diabetes mellitus with diabetic neuropathy, unspecified: Secondary | ICD-10-CM

## 2020-05-21 NOTE — Chronic Care Management (AMB) (Signed)
Chronic Care Management    Clinical Social Work Note  05/21/2020 Name: Tina Short MRN: [id number] DOB: [birth year]  [place] Tina Short is a 65 y.o. year old female who is a primary care patient of Steele Sizer, MD. The CCM team was consulted to assist the patient with chronic disease management and/or care coordination needs related to: Mental Health Counseling and Resources.   Engaged with patient by telephone for initial visit in response to provider referral for social work chronic care management and care coordination services.   Consent to Services:  The patient was given the following information about Chronic Care Management services today, agreed to services, and gave verbal consent: 1. CCM service includes personalized support from designated clinical staff supervised by the primary care provider, including individualized plan of care and coordination with other care providers 2. 24/7 contact phone numbers for assistance for urgent and routine care needs. 3. Service will only be billed when office clinical staff spend 20 minutes or more in a month to coordinate care. 4. Only one practitioner may furnish and bill the service in a calendar month. 5.The patient may stop CCM services at any time (effective at the end of the month) by phone call to the office staff. 6. The patient will be responsible for cost sharing (co-pay) of up to 20% of the service fee (after annual deductible is met). Patient agreed to services and consent obtained.  Patient assessed, patient's trauma history discussed. Patient provided with emotional support and reassurance, trauma therapy highly recommended however patient declined further counseling services stating that she would like to see if medication adjustments would be more beneficial. Patient did agree to continued work with [facility] and Pharmacy services, consent obtained.   Assessment: Review of patient past medical history, allergies, medications, and health  status, including review of relevant consultants reports was performed today as part of a comprehensive evaluation and provision of chronic care management and care coordination services.     SDOH (Social Determinants of Health) assessments and interventions performed:  SDOH Interventions   Flowsheet Row Most Recent Value  SDOH Interventions   SDOH Interventions for the Following Domains Depression  Depression Interventions/Treatment  Medication, Counseling  [patient refusing therapy]       Advanced Directives Status: Not addressed in this encounter.  CCM Care Plan  Allergies  Allergen Reactions  . Abilify [Aripiprazole] Shortness Of Breath    Other reaction(s): Difficulty breathing  . Invokana [Canagliflozin] Itching    Yeast infections  . Metformin And Related     Diarrhea, but doing well with XR formulation    . Lovaza [Omega-3-Acid Ethyl Esters] Other (See Comments)    Burping     Outpatient Encounter Medications as of 05/21/2020  Medication Sig Note  . allopurinol (ZYLOPRIM) 100 MG tablet Take 1 tablet (100 mg total) by mouth 2 (two) times daily.   Marland Kitchen atorvastatin (LIPITOR) 40 MG tablet Take 1 tablet (40 mg total) by mouth at bedtime.   . blood glucose meter kit and supplies Dispense based on patient and insurance preference. Use up to four times daily as directed. (FOR ICD-10 E10.9, E11.9).   . clotrimazole (LOTRIMIN) 1 % cream Apply 1 application topically 2 (two) times daily. (Patient not taking: Reported on 05/08/2020)   . divalproex (DEPAKOTE ER) 250 MG 24 hr tablet Take 1 tablet (250 mg total) by mouth daily. (Patient not taking: Reported on 05/17/2020) 05/17/2020: Reports not taking d/t concerns regarding side effects.  . fenofibrate (TRICOR) 145 MG  tablet TAKE 1 TABLET(145 MG) BY MOUTH DAILY   . furosemide (LASIX) 40 MG tablet Daily (Patient not taking: Reported on 05/08/2020) 05/17/2020: Reports taking as needed.  Marland Kitchen glipiZIDE (GLUCOTROL XL) 5 MG 24 hr tablet Take 1 tablet (5  mg total) by mouth daily with breakfast.   . hydrocortisone cream 0.5 % Apply 1 application topically 2 (two) times daily. (Patient not taking: Reported on 05/08/2020)   . losartan (COZAAR) 100 MG tablet Take 1 tablet (100 mg total) by mouth daily.   . melatonin 5 MG TABS Take 5 mg by mouth.   . metFORMIN (GLUCOPHAGE-XR) 750 MG 24 hr tablet TAKE 1 TABLET(750 MG) BY MOUTH TWICE DAILY   . Multiple Vitamin (MULTIVITAMIN WITH MINERALS) TABS tablet Take 1 tablet by mouth daily. (Patient not taking: Reported on 05/17/2020)   . ONETOUCH ULTRA test strip TEST UP TO FOUR TIMES A DAY AS DIRECTED   . potassium chloride SA (K-DUR) 20 MEQ tablet TAKE 1 TABLET(20 MEQ) BY MOUTH DAILY (Patient not taking: Reported on 05/08/2020) 05/17/2020: Reports taking only if taking Lasix  . venlafaxine XR (EFFEXOR-XR) 150 MG 24 hr capsule Take 1 capsule (150 mg total) by mouth daily with breakfast.   . vitamin B-12 (CYANOCOBALAMIN) 500 MCG tablet Take 500 mcg by mouth daily.   . Vitamin D, Ergocalciferol, (DRISDOL) 1.25 MG (50000 UNIT) CAPS capsule Take 1 capsule (50,000 Units total) by mouth every 7 (seven) days.    No facility-administered encounter medications on file as of 05/21/2020.    Patient Active Problem List   Diagnosis Date Noted  . Esophageal dysphagia   . Colon cancer screening 04/30/2016  . Major depression, recurrent, chronic ([place]) 10/30/2015  . Dyslipidemia associated with type 2 diabetes mellitus ([place]) 10/30/2015  . Vitamin D deficiency 07/27/2015  . Vitamin B12 deficiency 07/27/2015  . History of anemia 09/12/2014  . Carpal tunnel syndrome 09/12/2014  . Obstructive sleep apnea 09/12/2014  . Edema of extremities 09/12/2014  . Gout 09/12/2014  . Morbid obesity, unspecified obesity type ([place]) 11/15/2009  . Benign essential HTN 10/07/2006  . Type 2 diabetes mellitus, uncontrolled, with neuropathy ([place]) 10/07/2006  . Hyperlipidemia 10/07/2006    Conditions to be addressed/monitored:  Mental Health  Concerns   There are no care plans that you recently modified to display for this patient.    Follow Up Plan: Client will continue to follow up with CCM RNCM and Pharmacist      Elliot Gurney, [place] Worker  [facility] Care Management [phone]

## 2020-05-22 DIAGNOSIS — M1712 Unilateral primary osteoarthritis, left knee: Secondary | ICD-10-CM | POA: Diagnosis not present

## 2020-05-23 ENCOUNTER — Ambulatory Visit: Payer: PPO

## 2020-05-23 DIAGNOSIS — M170 Bilateral primary osteoarthritis of knee: Secondary | ICD-10-CM

## 2020-05-23 DIAGNOSIS — F339 Major depressive disorder, recurrent, unspecified: Secondary | ICD-10-CM | POA: Diagnosis not present

## 2020-05-23 DIAGNOSIS — E1169 Type 2 diabetes mellitus with other specified complication: Secondary | ICD-10-CM

## 2020-05-23 DIAGNOSIS — E1165 Type 2 diabetes mellitus with hyperglycemia: Secondary | ICD-10-CM | POA: Diagnosis not present

## 2020-05-23 DIAGNOSIS — E1159 Type 2 diabetes mellitus with other circulatory complications: Secondary | ICD-10-CM

## 2020-05-23 DIAGNOSIS — E785 Hyperlipidemia, unspecified: Secondary | ICD-10-CM

## 2020-05-23 DIAGNOSIS — E1140 Type 2 diabetes mellitus with diabetic neuropathy, unspecified: Secondary | ICD-10-CM

## 2020-05-23 DIAGNOSIS — I152 Hypertension secondary to endocrine disorders: Secondary | ICD-10-CM | POA: Diagnosis not present

## 2020-05-23 DIAGNOSIS — M109 Gout, unspecified: Secondary | ICD-10-CM

## 2020-05-23 NOTE — Progress Notes (Signed)
Chronic Care Management Pharmacy Note  05/23/2020 Name:  AANYA HAYNES MRN:  [id number] DOB:  January 10, 1956  Subjective: Zahriyah Joo Boliver is an 65 y.o. year old female who is a primary patient of Steele Sizer, MD.  The CCM team was consulted for assistance with disease management and care coordination needs.    Engaged with patient by telephone for initial visit in response to provider referral for pharmacy case management and/or care coordination services.   Consent to Services:  The patient was given the following information about Chronic Care Management services today, agreed to services, and gave verbal consent: 1. CCM service includes personalized support from designated clinical staff supervised by the primary care provider, including individualized plan of care and coordination with other care providers 2. 24/7 contact phone numbers for assistance for urgent and routine care needs. 3. Service will only be billed when office clinical staff spend 20 minutes or more in a month to coordinate care. 4. Only one practitioner may furnish and bill the service in a calendar month. 5.The patient may stop CCM services at any time (effective at the end of the month) by phone call to the office staff. 6. The patient will be responsible for cost sharing (co-pay) of up to 20% of the service fee (after annual deductible is met). Patient agreed to services and consent obtained.  Patient Care Team: Steele Sizer, MD as PCP - General (Family Medicine) Steele Sizer, MD as Attending Physician (Family Medicine) Bary Castilla, [place] Gleason, MD (General Surgery) Neldon Labella, RN as Registered Nurse Jenita Seashore, Darla Lesches, LCSW as Social Worker Germaine Pomfret, [facility] (Pharmacist)  Recent office visits: 05/08/20: Patient presented to Dr. Ancil Boozer for follow-up. A1c improved to 7.7%.Patient started on divalproex 250 mg daily.   Recent consult visits: None in previous 6 months  Hospital visits: None in previous 6  months  Objective:  Lab Results  Component Value Date   CREATININE 0.63 05/08/2020   BUN 12 05/08/2020   GFRNONAA >60 04/25/2019   GFRAA >60 04/25/2019   NA 141 05/08/2020   K 4.3 05/08/2020   CALCIUM 9.6 05/08/2020   CO2 19 (L) 05/08/2020   GLUCOSE 209 (H) 05/08/2020    Lab Results  Component Value Date/Time   HGBA1C 7.7 (H) 05/08/2020 01:10 PM   HGBA1C 8.0 (A) 01/09/2020 12:06 PM   HGBA1C 6.8 (A) 09/07/2019 09:55 AM   HGBA1C 8.5 (H) 03/18/2019 10:45 AM   HGBA1C 6.7 01/29/2018 11:42 AM   HGBA1C 6.7 (A) 01/29/2018 11:42 AM   HGBA1C 6.7 01/29/2018 11:42 AM   FRUCTOSAMINE 266 09/12/2014 09:12 AM   MICROALBUR 50 09/12/2014 08:14 AM    Last diabetic Eye exam:  Lab Results  Component Value Date/Time   HMDIABEYEEXA No Retinopathy 10/08/2017 12:00 AM    Last diabetic Foot exam: No results found for: HMDIABFOOTEX   Lab Results  Component Value Date   CHOL 189 05/08/2020   HDL 31 (L) 05/08/2020   LDLCALC 96 05/08/2020   TRIG 368 (H) 05/08/2020   CHOLHDL 6.1 (H) 05/08/2020    Hepatic Function Latest Ref Rng & Units 05/08/2020 04/22/2019 04/21/2019  Total Protein 6.0 - 8.5 g/dL 7.5 7.6 8.3(H)  Albumin 3.8 - 4.8 g/dL 4.6 3.5 3.7  AST 0 - 40 IU/L 39 37 38  ALT 0 - 32 IU/L 31 29 32  Alk Phosphatase 44 - 121 IU/L 60 55 55  Total Bilirubin 0.0 - 1.2 mg/dL 0.8 1.1 1.5(H)    Lab Results  Component Value  Date/Time   TSH 1.380 07/26/2015 11:19 AM   TSH 0.95 05/26/2014 12:00 AM    CBC Latest Ref Rng & Units 05/08/2020 04/25/2019 04/24/2019  WBC 3.4 - 10.8 x10E3/uL 5.5 4.8 4.3  Hemoglobin 11.1 - 15.9 g/dL 14.5 12.2 13.1  Hematocrit 34.0 - 46.6 % 42.5 34.9(L) 38.2  Platelets 150 - 450 x10E3/uL 185 144(L) 146(L)    Lab Results  Component Value Date/Time   VD25OH 20.4 (L) 03/18/2019 10:45 AM   VD25OH 23.9 (L) 10/26/2018 11:23 AM    Clinical ASCVD: No  The 10-year ASCVD risk score Mikey Bussing DC Jr., et al., 2013) is: 18%   Values used to calculate the score:     Age: 55 years      Sex: Female     Is Non-Hispanic African American: No     Diabetic: Yes     Tobacco smoker: No     Systolic Blood Pressure: 825 mmHg     Is BP treated: Yes     HDL Cholesterol: 31 mg/dL     Total Cholesterol: 189 mg/dL    Depression screen [facility] 2/9 05/21/2020 05/17/2020 05/08/2020  Decreased Interest '1 1 3  ' Down, Depressed, Hopeless '1 1 3  ' PHQ - 2 Score '2 2 6  ' Altered sleeping 2 - 2  Tired, decreased energy 2 - 3  Change in appetite 3 - 3  Feeling bad or failure about yourself  2 - 2  Trouble concentrating 0 - 0  Moving slowly or fidgety/restless 0 - 0  Suicidal thoughts 0 - 0  PHQ-9 Score 11 - 16  Difficult doing work/chores - - -  Some recent data might be hidden    Social History   Tobacco Use  Smoking Status Never Smoker  Smokeless Tobacco Never Used   BP Readings from Last 3 Encounters:  05/08/20 132/74  01/09/20 118/76  09/07/19 120/78   Pulse Readings from Last 3 Encounters:  05/08/20 95  01/09/20 96  09/07/19 105   Wt Readings from Last 3 Encounters:  05/08/20 284 lb (128.8 kg)  01/09/20 276 lb 11.2 oz (125.5 kg)  09/07/19 (!) 274 lb 3.2 oz (124.4 kg)   BMI Readings from Last 3 Encounters:  05/08/20 43.18 kg/m  01/09/20 42.07 kg/m  09/07/19 41.69 kg/m    Assessment/Interventions: Review of patient past medical history, allergies, medications, health status, including review of consultants reports, laboratory and other test data, was performed as part of comprehensive evaluation and provision of chronic care management services.   SDOH:  (Social Determinants of Health) assessments and interventions performed: Yes  SDOH Screenings   Alcohol Screen: Low Risk   . Last Alcohol Screening Score (AUDIT): 0  Depression (PHQ2-9): Medium Risk  . PHQ-2 Score: 11  Financial Resource Strain: Low Risk   . Difficulty of Paying Living Expenses: Not very hard  Food Insecurity: No Food Insecurity  . Worried About Charity fundraiser in the Last Year: Never true   . Ran Out of Food in the Last Year: Never true  Housing: Low Risk   . Last Housing Risk Score: 0  Physical Activity: Inactive  . Days of Exercise per Week: 0 days  . Minutes of Exercise per Session: 0 min  Social Connections: Moderately Integrated  . Frequency of Communication with Friends and Family: More than three times a week  . Frequency of Social Gatherings with Friends and Family: More than three times a week  . Attends Religious Services: More than 4 times per  year  . Active Member of Clubs or Organizations: No  . Attends Archivist Meetings: Never  . Marital Status: Married  Stress: No Stress Concern Present  . Feeling of Stress : Not at all  Tobacco Use: Low Risk   . Smoking Tobacco Use: Never Smoker  . Smokeless Tobacco Use: Never Used  Transportation Needs: No Transportation Needs  . Lack of Transportation (Medical): No  . Lack of Transportation (Non-Medical): No    CCM Care Plan  Allergies  Allergen Reactions  . Abilify [Aripiprazole] Shortness Of Breath    Other reaction(s): Difficulty breathing  . Invokana [Canagliflozin] Itching    Yeast infections  . Metformin And Related     Diarrhea, but doing well with XR formulation    . Lovaza [Omega-3-Acid Ethyl Esters] Other (See Comments)    Burping     Medications Reviewed Today    Reviewed by Neldon Labella, RN (Registered Nurse) on 05/17/20 at [place] List Status: <None>  Medication Order Taking? Sig Documenting Provider Last Dose Status Informant  allopurinol (ZYLOPRIM) 100 MG tablet [id number]  Take 1 tablet (100 mg total) by mouth 2 (two) times daily. Steele Sizer, MD  Active   atorvastatin (LIPITOR) 40 MG tablet [id number]  Take 1 tablet (40 mg total) by mouth at bedtime. Steele Sizer, MD  Active   blood glucose meter kit and supplies [id number]  Dispense based on patient and insurance preference. Use up to four times daily as directed. (FOR ICD-10 E10.9, E11.9). Steele Sizer, MD  Active  Self  clotrimazole (LOTRIMIN) 1 % cream [id number]  Apply 1 application topically 2 (two) times daily.  Patient not taking: Reported on 05/08/2020   Steele Sizer, MD  Active Self  divalproex (DEPAKOTE ER) 250 MG 24 hr tablet [id number]  Take 1 tablet (250 mg total) by mouth daily. Steele Sizer, MD  Active   fenofibrate (TRICOR) 145 MG tablet [id number]  TAKE 1 TABLET(145 MG) BY MOUTH DAILY Ancil Boozer, Drue Stager, MD  Active   furosemide (LASIX) 40 MG tablet [id number]  Daily  Patient not taking: Reported on 05/08/2020   Steele Sizer, MD  Active   glipiZIDE (GLUCOTROL XL) 5 MG 24 hr tablet [id number] Yes Take 1 tablet (5 mg total) by mouth daily with breakfast. Steele Sizer, MD Taking Active   hydrocortisone cream 0.5 % [id number]  Apply 1 application topically 2 (two) times daily.  Patient not taking: Reported on 05/08/2020   Steele Sizer, MD  Active Self  losartan (COZAAR) 100 MG tablet [id number]  Take 1 tablet (100 mg total) by mouth daily. Steele Sizer, MD  Active   metFORMIN (GLUCOPHAGE-XR) 750 MG 24 hr tablet [id number] Yes TAKE 1 TABLET(750 MG) BY MOUTH TWICE DAILY Sowles, Drue Stager, MD Taking Active   Multiple Vitamin (MULTIVITAMIN WITH MINERALS) TABS tablet [id number]  Take 1 tablet by mouth daily. [provider]  Active Self  Donald Siva test strip [id number]  TEST UP TO FOUR TIMES A DAY AS DIRECTED Steele Sizer, MD  Active   potassium chloride SA (K-DUR) 20 MEQ tablet [id number]  TAKE 1 TABLET(20 MEQ) BY MOUTH DAILY  Patient not taking: Reported on 05/08/2020   Steele Sizer, MD  Active Self  venlafaxine XR (EFFEXOR-XR) 150 MG 24 hr capsule [id number]  Take 1 capsule (150 mg total) by mouth daily with breakfast. Steele Sizer, MD  Active   vitamin B-12 (CYANOCOBALAMIN) 500 MCG tablet [id number]  Take 500 mcg by mouth daily. [provider]  Active   Vitamin  D, Ergocalciferol, (DRISDOL) 1.25 MG (50000 UNIT) CAPS capsule [id number]  Take 1 capsule (50,000 Units  total) by mouth every 7 (seven) days. Steele Sizer, MD  Active           Patient Active Problem List   Diagnosis Date Noted  . Esophageal dysphagia   . Colon cancer screening 04/30/2016  . Major depression, recurrent, chronic ([place]) 10/30/2015  . Dyslipidemia associated with type 2 diabetes mellitus ([place]) 10/30/2015  . Vitamin D deficiency 07/27/2015  . Vitamin B12 deficiency 07/27/2015  . History of anemia 09/12/2014  . Carpal tunnel syndrome 09/12/2014  . Obstructive sleep apnea 09/12/2014  . Edema of extremities 09/12/2014  . Gout 09/12/2014  . Morbid obesity, unspecified obesity type ([place]) 11/15/2009  . Benign essential HTN 10/07/2006  . Type 2 diabetes mellitus, uncontrolled, with neuropathy ([place]) 10/07/2006  . Hyperlipidemia 10/07/2006    Immunization History  Administered Date(s) Administered  . Influenza Nasal 10/28/2018  . Influenza,inj,Quad PF,6+ Mos 11/27/2016, 01/29/2018, 01/09/2020  . Pneumococcal Conjugate-13 05/02/2013  . Pneumococcal Polysaccharide-23 12/27/2014, 10/28/2018  . Tdap 11/15/2007, 01/29/2018  . Zoster 01/23/2016    Conditions to be addressed/monitored:  Hypertension, Hyperlipidemia, Diabetes, GERD, Depression, Osteoarthritis and Gout  There are no care plans that you recently modified to display for this patient.    Medication Assistance: Application for Ozempic  medication assistance program. in process.  Anticipated assistance start date TBD.  See plan of care for additional detail.  Patient's preferred pharmacy is:  [facility] [place], [place] [facility] [place] [address] [place] [address] Phone: [phone] Fax: [phone]  Uses pill box? Yes Pt endorses 100% compliance  We discussed: Current pharmacy is preferred with insurance plan and patient is satisfied with pharmacy services Patient decided to: Continue current medication management  strategy  Care Plan and Follow Up Patient Decision:  Patient agrees to Care Plan and Follow-up.  Plan: Telephone follow up appointment with care management team member scheduled for:  08/16/2020 at 2:00 PM  Doristine Section, [facility] [phone]

## 2020-05-23 NOTE — Patient Instructions (Addendum)
Thank you for allowing the Chronic Care Management team to participate in your care. It was a pleasure speaking with you. Please feel free to contact me with questions.  Goals Addressed: Patient Care Plan: Diabetes Type 2 (Adult)    Problem Identified: Glycemic Management (Diabetes, Type 2)     Long-Range Goal: Glycemic Management Optimized   Start Date: 05/17/2020  Expected End Date: 09/14/2020  Priority: High  Note:   Objective:  Lab Results  Component Value Date   HGBA1C 7.7 (H) 05/08/2020 .   Lab Results  Component Value Date   CREATININE 0.63 05/08/2020   CREATININE 0.44 04/25/2019   CREATININE 0.41 (L) 04/24/2019 .   Marland Kitchen No results found for: EGFR   Current Barriers:  . Chronic disease management support and educational needs related to Diabetes self-management   Case Manager Clinical Goal(s):  Marland Kitchen Over the next 120 days, patient will demonstrate improved adherence to prescribed treatment plan for Diabetes management as evidenced by taking medications as prescribed, monitoring and recording of CBG, and adherence to a ADA/ carb modified diet.   Interventions:  . Collaboration with Steele Sizer, MD regarding development and update of comprehensive plan of care as evidenced by provider attestation and co-signature . Inter-disciplinary care team collaboration (see longitudinal plan of care) . Reviewed medications and importance of compliance. Reports self-managing her medications well. Encouraged to take medications as prescribed and notify her provider if unable to tolerate the prescribed regimen. She was previously taking Ozempic but reports discontinuing due to cost. Will update the CCM Pharmacist regarding possible need for medication assistance. . Discussed current plan for Diabetes management. Reviewed s/sx of hypoglycemia and hyperglycemia along with recommended interventions. She does not monitor her blood glucose levels but attempting to manage her diet. Advised to  monitor fasting levels and maintain a log. Notes better glycemic and appetite control along with a 30 lb weight loss with Ozempic. She is hoping to switch back to this medication if assistance is available.  . Discussed and offered referrals for available Diabetes education classes. Declined need for additional classes. Her AIC is currently not at goal but has improved from 8.9 to 7.7%. . Discussed importance of completing recommended DM preventive care.  Reports completing recommended foot care. She is due for an eye exam and plans to schedule.   Patient Goals/Self-Care Activities -Self-administer medications as prescribed -Attend all scheduled provider appointments -Follow provider's recommendations for optimal glycemic control -Adhere to prescribed ADA/carb modified -Notify provider or care management team with questions and new concerns as needed   Follow Up Plan:  Will follow up next month           Ms. Gunn was given information about Chronic Care Management services including:  1. CCM service includes personalized support from designated clinical staff supervised by her physician, including individualized plan of care and coordination with other care providers 2. 24/7 contact phone numbers for assistance for urgent and routine care needs. 3. Service will only be billed when office clinical staff spend 20 minutes or more in a month to coordinate care. 4. Only one practitioner may furnish and bill the service in a calendar month. 5. The patient may stop CCM services at any time (effective at the end of the month) by phone call to the office staff. 6. The patient will be responsible for cost sharing (co-pay) of up to 20% of the service fee (after annual deductible is met).  Patient agreed to services and verbal consent obtained.  Mrs. Orne verbalized understanding of the information discussed during the telephonic outreach today. Declined need for mailed/printed  instructions. A member of the care management team will follow up with Mrs. Briones next month.     [name] Care Management [facility] [phone]

## 2020-05-24 ENCOUNTER — Telehealth: Payer: Self-pay | Admitting: Family Medicine

## 2020-05-24 NOTE — Telephone Encounter (Signed)
Called to check the status of a surgical clearance form that was faxed on 4/12.  Please call to confirm.  CB# [id number], ext. [phone]

## 2020-05-25 NOTE — Telephone Encounter (Signed)
I have not seen this but am keeping an eye out for it.

## 2020-05-29 ENCOUNTER — Telehealth: Payer: Self-pay

## 2020-05-29 NOTE — Chronic Care Management (AMB) (Signed)
    Chronic Care Management Pharmacy Assistant   Name: Tina Short  MRN: [id number] DOB: 07-27-1955  Reason for Encounter: Patient Assistance Coordination  05/29/2020- Patient assistance forms filled out for Ozempic with [employer] patient assistance program. Called patient to inform form was ready and will place in the mail to her. No answer, left message that I will highlight and tag all areas where she needs to complete and if she can bring forms back to PCP office for Dr Ancil Boozer to sign and Cristie Hem to fax. [name], [name] notified.  Medications: Outpatient Encounter Medications as of 05/29/2020  Medication Sig Note  . allopurinol (ZYLOPRIM) 100 MG tablet Take 1 tablet (100 mg total) by mouth 2 (two) times daily.   Marland Kitchen atorvastatin (LIPITOR) 40 MG tablet Take 1 tablet (40 mg total) by mouth at bedtime.   . blood glucose meter kit and supplies Dispense based on patient and insurance preference. Use up to four times daily as directed. (FOR ICD-10 E10.9, E11.9).   . clotrimazole (LOTRIMIN) 1 % cream Apply 1 application topically 2 (two) times daily. (Patient not taking: No sig reported)   . divalproex (DEPAKOTE ER) 250 MG 24 hr tablet Take 1 tablet (250 mg total) by mouth daily. (Patient not taking: No sig reported) 05/17/2020: Reports not taking d/t concerns regarding side effects.  . fenofibrate (TRICOR) 145 MG tablet TAKE 1 TABLET(145 MG) BY MOUTH DAILY   . furosemide (LASIX) 40 MG tablet Daily (Patient not taking: Reported on 05/08/2020) 05/17/2020: Reports taking as needed.  Marland Kitchen glipiZIDE (GLUCOTROL XL) 5 MG 24 hr tablet Take 1 tablet (5 mg total) by mouth daily with breakfast.   . hydrocortisone cream 0.5 % Apply 1 application topically 2 (two) times daily. (Patient not taking: No sig reported)   . losartan (COZAAR) 100 MG tablet Take 1 tablet (100 mg total) by mouth daily.   . melatonin 5 MG TABS Take 5 mg by mouth. (Patient not taking: Reported on 05/23/2020)   . metFORMIN (GLUCOPHAGE-XR) 750  MG 24 hr tablet TAKE 1 TABLET(750 MG) BY MOUTH TWICE DAILY   . ONETOUCH ULTRA test strip TEST UP TO FOUR TIMES A DAY AS DIRECTED   . potassium chloride SA (K-DUR) 20 MEQ tablet TAKE 1 TABLET(20 MEQ) BY MOUTH DAILY (Patient not taking: Reported on 05/08/2020) 05/17/2020: Reports taking only if taking Lasix  . venlafaxine XR (EFFEXOR-XR) 150 MG 24 hr capsule Take 1 capsule (150 mg total) by mouth daily with breakfast.   . vitamin B-12 (CYANOCOBALAMIN) 500 MCG tablet Take 500 mcg by mouth daily. (Patient not taking: Reported on 05/23/2020)   . Vitamin D, Ergocalciferol, (DRISDOL) 1.25 MG (50000 UNIT) CAPS capsule Take 1 capsule (50,000 Units total) by mouth every 7 (seven) days.    No facility-administered encounter medications on file as of 05/29/2020.    Star Rating Drugs: Atorvastatin 40 mg- Last filled 12/08/2019 for 90 day supply at [facility]  Losartan 100 mg- Last filled 03/10/2020 for 90 day supply at [facility] 1 mg- Last filled 07/11/2018 for 84 day supply at [facility] Glipizide ER 5 mg- Last filled 04/18/2020 for 90 day supply at [facility] Metformin ER 750 mg- Last filled 03/27/2020 for 90 day supply at [place]: Pattricia Boss, [place] Pharmacist Assistant [phone]

## 2020-06-04 NOTE — Patient Instructions (Addendum)
Visit Information It was great speaking with you today!  Please let me know if you have any questions about our visit.  Goals Addressed            This Visit's Progress   . Track and Manage My Blood Pressure-Hypertension       Timeframe:  Long-Range Goal Priority:  High Start Date: 05/23/2020                             Expected End Date:  11/22/20                     Follow Up Date 08/09/2020    - check blood pressure weekly    Why is this important?    You won't feel high blood pressure, but it can still hurt your blood vessels.   High blood pressure can cause heart or kidney problems. It can also cause a stroke.   Making lifestyle changes like losing a little weight or eating less salt will help.   Checking your blood pressure at home and at different times of the day can help to control blood pressure.   If the doctor prescribes medicine remember to take it the way the doctor ordered.   Call the office if you cannot afford the medicine or if there are questions about it.     Notes:          Patient Care Plan: General Pharmacy (Adult)    Problem Identified: Hypertension, Hyperlipidemia, Diabetes, GERD, Depression, Osteoarthritis and Gout   Priority: High    Long-Range Goal: Patient-Specific Goal   Start Date: 05/23/2020  Expected End Date: 11/22/2020  This Visit's Progress: On track  Priority: High  Note:   Current Barriers:  . Suboptimal therapeutic regimen for Diabetes  . Suboptimal therapeutic regimen for Cholesterol  Pharmacist Clinical Goal(s):  Marland Kitchen Patient will achieve control of cholesterol as evidenced by LDL less than 100, TG less than 150 . maintain control of diabetes as evidenced by A1c less than 8%  through collaboration with PharmD and provider.   Interventions: . 1:1 collaboration with Steele Sizer, MD regarding development and update of comprehensive plan of care as evidenced by provider attestation and co-signature . Inter-disciplinary  care team collaboration (see longitudinal plan of care) . Comprehensive medication review performed; medication list updated in electronic medical record  Hypertension (BP goal <140/90) -Controlled -Current treatment: . Losartan 100 mg daily  . Furosemide 40 mg daily (not taking) -Medications previously tried: NA  -Current home readings: NA -Denies hypotensive/hypertensive symptoms -Patient reports dry, chronic cough, attributes to allergies -Educated on Exercise goal of 150 minutes per week; Importance of home blood pressure monitoring; Proper BP monitoring technique; -Counseled to monitor BP at home weekly, document, and provide log at future appointments -Recommended to continue current medication  Hyperlipidemia: (LDL goal < 100) -Not ideally controlled -Current treatment: . Atorvastatin 40 mg at bedtime  . Fenofibrate 145 mg daily  -Medications previously tried: Lovaza  -Educated on Importance of limiting foods high in cholesterol; -Recommended to continue current medication  Diabetes (A1c goal <8%) -Diagnosed: 1995  -Controlled -Current medications: Marland Kitchen Glipizide XL 5 mg daily  . Metformin XR 750 mg two tablets daily  -Medications previously tried: Trulicity, Glimepiride, Ozempic (Cost), Invokana,  -Patient interested in retrial of Ozempic if it can get approved for patient assistance.  -Current home glucose readings . fasting glucose: 130 . post prandial glucose:  NA -Reports  hyperglycemic symptoms: polyphagia  -Educated on Benefits of routine self-monitoring of blood sugar; Carbohydrate counting and/or plate method -Counseled to check feet daily and get yearly eye exams -Recommended to continue current medication Assessed patient finances. Will start PAP for Ozempic  Depression/Insomnia (Goal: Maintain mood, improve sleep quality) -Controlled -Current treatment: . Divalproex ER 250 mg daily (not taking)  . Venlafaxine XR 150 mg daily  -Medications previously  tried/failed: Fluoxetine, Abilify (shortness of breath)  -PHQ9: 11 -GAD7: NA -Some nights she is completely unable to sleep, some nights sleep 12 hours.  -Educated on Benefits of medication for symptom control  -Counseled on proper sleep hygiene  -Recommended to continue current medication   Gout (Goal: prevent gout flares) -Controlled -Current treatment  . Allopurinol 100 mg twice daily  -Medications previously tried: NA  -Last gout flare: > 5 years  -Recommended to continue current medication  Osteoarthritis (Goal: Minimize pain and improve quality of life ) -Controlled -Current treatment  . Ibuprofen 200-400 mg q6hr PRN moderate or severe arthritis  -Medications previously tried: NA  -Counseled on minimizing NSAID use, risks of chronic NSAID use -Recommended to continue current medication  Patient Goals/Self-Care Activities . Patient will:  - check glucose daily, document, and provide at future appointments check blood pressure weekly, document, and provide at future appointments  Follow Up Plan: Telephone follow up appointment with care management team member scheduled for:  08/16/2020 at 2:00 PM    Patient agreed to services and verbal consent obtained.   The patient verbalized understanding of instructions, educational materials, and care plan provided today and declined offer to receive copy of patient instructions, educational materials, and care plan.   Doristine Section, [place] [facility] [phone]

## 2020-06-07 ENCOUNTER — Telehealth: Payer: Self-pay

## 2020-06-07 NOTE — Progress Notes (Signed)
    Chronic Care Management Pharmacy Assistant   Name: Tina Short  MRN: [id number] DOB: 12/12/1955  Reason for Encounter: Medication Review   Recent office visits:  N/A  Recent consult visits:  [facility] visits:  None in previous 6 months  Medications: Outpatient Encounter Medications as of 06/07/2020  Medication Sig Note  . allopurinol (ZYLOPRIM) 100 MG tablet Take 1 tablet (100 mg total) by mouth 2 (two) times daily.   Marland Kitchen atorvastatin (LIPITOR) 40 MG tablet Take 1 tablet (40 mg total) by mouth at bedtime.   . blood glucose meter kit and supplies Dispense based on patient and insurance preference. Use up to four times daily as directed. (FOR ICD-10 E10.9, E11.9).   . clotrimazole (LOTRIMIN) 1 % cream Apply 1 application topically 2 (two) times daily. (Patient not taking: No sig reported)   . divalproex (DEPAKOTE ER) 250 MG 24 hr tablet Take 1 tablet (250 mg total) by mouth daily. (Patient not taking: No sig reported) 05/17/2020: Reports not taking d/t concerns regarding side effects.  . fenofibrate (TRICOR) 145 MG tablet TAKE 1 TABLET(145 MG) BY MOUTH DAILY   . furosemide (LASIX) 40 MG tablet Daily (Patient not taking: Reported on 05/08/2020) 05/17/2020: Reports taking as needed.  Marland Kitchen glipiZIDE (GLUCOTROL XL) 5 MG 24 hr tablet Take 1 tablet (5 mg total) by mouth daily with breakfast.   . hydrocortisone cream 0.5 % Apply 1 application topically 2 (two) times daily. (Patient not taking: No sig reported)   . losartan (COZAAR) 100 MG tablet Take 1 tablet (100 mg total) by mouth daily.   . melatonin 5 MG TABS Take 5 mg by mouth. (Patient not taking: Reported on 05/23/2020)   . metFORMIN (GLUCOPHAGE-XR) 750 MG 24 hr tablet TAKE 1 TABLET(750 MG) BY MOUTH TWICE DAILY   . ONETOUCH ULTRA test strip TEST UP TO FOUR TIMES A DAY AS DIRECTED   . potassium chloride SA (K-DUR) 20 MEQ tablet TAKE 1 TABLET(20 MEQ) BY MOUTH DAILY (Patient not taking: Reported on 05/08/2020) 05/17/2020: Reports taking only  if taking Lasix  . venlafaxine XR (EFFEXOR-XR) 150 MG 24 hr capsule Take 1 capsule (150 mg total) by mouth daily with breakfast.   . vitamin B-12 (CYANOCOBALAMIN) 500 MCG tablet Take 500 mcg by mouth daily. (Patient not taking: Reported on 05/23/2020)   . Vitamin D, Ergocalciferol, (DRISDOL) 1.25 MG (50000 UNIT) CAPS capsule Take 1 capsule (50,000 Units total) by mouth every 7 (seven) days.    No facility-administered encounter medications on file as of 06/07/2020.     Reviewed chart and adherence measures. Per insurance data patient is 94 % adherent to Metformin , 94% adherent to Losartan, and 94% adherent to Atorvastatin .  [place] Pharmacist Assistant [phone]

## 2020-06-13 ENCOUNTER — Ambulatory Visit (INDEPENDENT_AMBULATORY_CARE_PROVIDER_SITE_OTHER): Payer: PPO

## 2020-06-13 DIAGNOSIS — E1140 Type 2 diabetes mellitus with diabetic neuropathy, unspecified: Secondary | ICD-10-CM

## 2020-06-13 NOTE — Progress Notes (Signed)
[employer] Patient Assistance form for Ozempic completed by patient and faxed for review on 06/13/20

## 2020-06-15 ENCOUNTER — Other Ambulatory Visit: Payer: Self-pay | Admitting: Family Medicine

## 2020-06-15 DIAGNOSIS — I10 Essential (primary) hypertension: Secondary | ICD-10-CM

## 2020-06-15 NOTE — Telephone Encounter (Signed)
/  Requested medication (s) are due for refill today: yes  Requested medication (s) are on the active medication list: yes  Last refill: 03/10/20  Future visit scheduled: yes  Notes to clinic:  insurance requests alternate medication    Requested Prescriptions  Pending Prescriptions Disp Refills   losartan (COZAAR) 100 MG tablet [Pharmacy Med Name: LOSARTAN 100MG  TABLETS] 90 tablet 1    Sig: TAKE 1 TABLET(100 MG) BY MOUTH DAILY      Cardiovascular:  Angiotensin Receptor Blockers Passed - 06/15/2020  1:08 PM      Passed - Cr in normal range and within 180 days    Creatinine, Ser  Date Value Ref Range Status  05/08/2020 0.63 0.57 - 1.00 mg/dL Final          Passed - K in normal range and within 180 days    Potassium  Date Value Ref Range Status  05/08/2020 4.3 3.5 - 5.2 mmol/L Final          Passed - Patient is not pregnant      Passed - Last BP in normal range    BP Readings from Last 1 Encounters:  05/08/20 132/74          Passed - Valid encounter within last 6 months    Recent Outpatient Visits           1 month ago Controlled type 2 diabetes mellitus with neuropathy [facility])   [facility] Steele Sizer, MD   5 months ago Controlled type 2 diabetes mellitus with neuropathy [facility])   [facility] Steele Sizer, MD   9 months ago Controlled type 2 diabetes mellitus with neuropathy [facility])   [facility] Steele Sizer, MD   1 year ago Thrombocytopenia [facility])   [facility] Steele Sizer, MD   1 year ago Cellulitis of left lower extremity   [facility] Towanda Malkin, MD       Future Appointments             In 2 months Ancil Boozer, Drue Stager, MD [facility]

## 2020-06-15 NOTE — Telephone Encounter (Signed)
Last appt 05-08-2020 and has appt 06-27-2020

## 2020-06-21 ENCOUNTER — Telehealth: Payer: Self-pay

## 2020-06-21 NOTE — Telephone Encounter (Signed)
Copied from [place] [phone]. Topic: General - Other >> Jun 21, 2020  2:29 PM Lennox Solders wrote: Reason for CRM: Pt is calling checking on the application for ozempic she dropped the form off at front desk a few weeks ago. Dr Ancil Boozer needed to sign the form and return to onsite pharmacist

## 2020-06-22 NOTE — Telephone Encounter (Signed)
Forms signed by Dr. Ancil Boozer and given to Pharmacist. Awaiting determination. Patient updated.

## 2020-06-26 ENCOUNTER — Telehealth: Payer: Self-pay

## 2020-06-26 NOTE — Telephone Encounter (Signed)
Spoke with Tina Short, she is going to re-fax forms.

## 2020-06-26 NOTE — Telephone Encounter (Signed)
We have received the forms!!

## 2020-06-26 NOTE — Telephone Encounter (Signed)
Called to update patient that we have received the surgical clearance paperwork from [facility] and we will reach out when it has been completed and faxed back. Left voicemail.

## 2020-06-26 NOTE — Telephone Encounter (Signed)
Paige, from [facility], calling to check on the status of clearance form. She states that this was faxed over on 05/22/20 and 06/13/20. Please advise.    Callback # [phone]  Fax# [phone]

## 2020-06-27 ENCOUNTER — Ambulatory Visit: Payer: Self-pay

## 2020-06-27 DIAGNOSIS — F339 Major depressive disorder, recurrent, unspecified: Secondary | ICD-10-CM | POA: Diagnosis not present

## 2020-06-27 DIAGNOSIS — E1140 Type 2 diabetes mellitus with diabetic neuropathy, unspecified: Secondary | ICD-10-CM

## 2020-06-27 DIAGNOSIS — E1165 Type 2 diabetes mellitus with hyperglycemia: Secondary | ICD-10-CM | POA: Diagnosis not present

## 2020-06-27 DIAGNOSIS — IMO0002 Reserved for concepts with insufficient information to code with codable children: Secondary | ICD-10-CM

## 2020-06-27 NOTE — Chronic Care Management (AMB) (Signed)
Chronic Care Management   Follow Up Note   06/27/2020 Name: Tina Short MRN: [id number] DOB: [birth year]  Primary Care Provider: Steele Sizer, MD Reason for referral : Chronic Care Management  Tina Short is a 65 y.o. year old female who is a primary care patient of Steele Sizer, MD. She is currently enrolled in the Chronic Care Management program. A routine telephonic outreach was conducted today.  Review of [name]'s status, including review of consultants reports, relevant labs and test results was conducted today. Collaboration with appropriate care team members was performed as part of the comprehensive evaluation and provision of chronic care management services.    SDOH (Social Determinants of Health) assessments performed: No    Outpatient Encounter Medications as of 06/27/2020  Medication Sig Note  . allopurinol (ZYLOPRIM) 100 MG tablet Take 1 tablet (100 mg total) by mouth 2 (two) times daily.   Marland Kitchen atorvastatin (LIPITOR) 40 MG tablet Take 1 tablet (40 mg total) by mouth at bedtime.   . blood glucose meter kit and supplies Dispense based on patient and insurance preference. Use up to four times daily as directed. (FOR ICD-10 E10.9, E11.9).   . clotrimazole (LOTRIMIN) 1 % cream Apply 1 application topically 2 (two) times daily. (Patient not taking: No sig reported)   . divalproex (DEPAKOTE ER) 250 MG 24 hr tablet Take 1 tablet (250 mg total) by mouth daily. (Patient not taking: No sig reported) 05/17/2020: Reports not taking d/t concerns regarding side effects.  . fenofibrate (TRICOR) 145 MG tablet TAKE 1 TABLET(145 MG) BY MOUTH DAILY   . furosemide (LASIX) 40 MG tablet Daily (Patient not taking: Reported on 05/08/2020) 05/17/2020: Reports taking as needed.  Marland Kitchen glipiZIDE (GLUCOTROL XL) 5 MG 24 hr tablet Take 1 tablet (5 mg total) by mouth daily with breakfast.   . hydrocortisone cream 0.5 % Apply 1 application topically 2 (two) times daily. (Patient not taking: No sig  reported)   . losartan (COZAAR) 100 MG tablet TAKE 1 TABLET(100 MG) BY MOUTH DAILY   . melatonin 5 MG TABS Take 5 mg by mouth. (Patient not taking: Reported on 05/23/2020)   . metFORMIN (GLUCOPHAGE-XR) 750 MG 24 hr tablet TAKE 1 TABLET(750 MG) BY MOUTH TWICE DAILY   . ONETOUCH ULTRA test strip TEST UP TO FOUR TIMES A DAY AS DIRECTED   . potassium chloride SA (K-DUR) 20 MEQ tablet TAKE 1 TABLET(20 MEQ) BY MOUTH DAILY (Patient not taking: Reported on 05/08/2020) 05/17/2020: Reports taking only if taking Lasix  . venlafaxine XR (EFFEXOR-XR) 150 MG 24 hr capsule Take 1 capsule (150 mg total) by mouth daily with breakfast.   . vitamin B-12 (CYANOCOBALAMIN) 500 MCG tablet Take 500 mcg by mouth daily. (Patient not taking: Reported on 05/23/2020)   . Vitamin D, Ergocalciferol, (DRISDOL) 1.25 MG (50000 UNIT) CAPS capsule Take 1 capsule (50,000 Units total) by mouth every 7 (seven) days.    No facility-administered encounter medications on file as of 06/27/2020.         Objective:  Patient Care Plan: Diabetes Type 2 (Adult)    Problem Identified: Glycemic Management (Diabetes, Type 2)     Long-Range Goal: Glycemic Management Optimized   Start Date: 05/17/2020  Expected End Date: 09/14/2020  Priority: High  Note:   Objective:  Lab Results  Component Value Date   HGBA1C 7.7 (H) 05/08/2020 .   Lab Results  Component Value Date   CREATININE 0.63 05/08/2020   CREATININE 0.44 04/25/2019   CREATININE 0.41 (  L) 04/24/2019 .   Marland Kitchen No results found for: EGFR    Current Barriers:  . Chronic disease management support and educational needs related to Diabetes self-management   Case Manager Clinical Goal(s):  Marland Kitchen Over the next 120 days, patient will demonstrate improved adherence to prescribed treatment plan for Diabetes management as evidenced by taking medications as prescribed, monitoring and recording of CBG, and adherence to a ADA/ carb modified diet.   Interventions:  . Collaboration with Steele Sizer, MD regarding development and update of comprehensive plan of care as evidenced by provider attestation and co-signature . Inter-disciplinary care team collaboration (see longitudinal plan of care) . Reviewed medications and compliance with diabetes self-management. Reports taking medications as prescribed and receiving a notification that the Ozempic medication assistance application was approved. Anticipating receipt of the medications within the next two weeks. Reports monitoring blood glucose levels as advised. Fasting levels have ranged in the 130's. She does recall elevated readings in the 200's later in the evening. She is attempting to improve her diet. She is pending right knee surgery. This surgery was previously delayed d/t her A1C being elevated. Anticipates better glycemic control once she restarts Ozempic. Planning to complete the surgery in July. . Reviewed s/sx of hypoglycemia and hyperglycemia along with recommended interventions.  . Reviewed compliance with recommended DM preventive care appointments. Report completing recommended foot care. Pending a dental exam. Reports not completing her annual eye exam as planned.  Agreed to call within the next few weeks to schedule.    Patient Goals/Self-Care Activities -Self-administer medications as prescribed -Attend all scheduled provider appointments -Follow provider's recommendations for optimal glycemic control -Adhere to prescribed ADA/carb modified -Contact the Optometry clinic to schedule an eye exam. -Notify provider or care management team with questions and new concerns as needed   Follow Up Plan:  Will follow up next month         PLAN A member of the care management team will follow up with [name] within the next month.    [name] [facility] Care Management [facility] [phone]

## 2020-07-01 NOTE — Patient Instructions (Signed)
Thank you for allowing the Chronic Care Management team to participate in your care. It was a pleasure speaking with you today. Please feel free to contact me with questions.    Goals Addressed: Patient Care Plan: Diabetes Type 2 (Adult)    Problem Identified: Glycemic Management (Diabetes, Type 2)     Long-Range Goal: Glycemic Management Optimized   Start Date: 05/17/2020  Expected End Date: 09/14/2020  Priority: High  Note:   Objective:  Lab Results  Component Value Date   HGBA1C 7.7 (H) 05/08/2020 .   Lab Results  Component Value Date   CREATININE 0.63 05/08/2020   CREATININE 0.44 04/25/2019   CREATININE 0.41 (L) 04/24/2019 .   Marland Kitchen No results found for: EGFR    Current Barriers:  . Chronic disease management support and educational needs related to Diabetes self-management   Case Manager Clinical Goal(s):  Marland Kitchen Over the next 120 days, patient will demonstrate improved adherence to prescribed treatment plan for Diabetes management as evidenced by taking medications as prescribed, monitoring and recording of CBG, and adherence to a ADA/ carb modified diet.   Interventions:  . Collaboration with Tina Sizer, MD regarding development and update of comprehensive plan of care as evidenced by provider attestation and co-signature . Inter-disciplinary care team collaboration (see longitudinal plan of care) . Reviewed medications and compliance with diabetes self-management. Reports taking medications as prescribed and receiving a notification that the Ozempic medication assistance application was approved. Anticipating receipt of the medications within the next two weeks. Reports monitoring blood glucose levels as advised. Fasting levels have ranged in the 130's. She does recall elevated readings in the 200's later in the evening. She is attempting to improve her diet. She is pending right knee surgery. This surgery was previously delayed d/t her A1C being elevated. Anticipates better  glycemic control once she restarts Ozempic. Planning to complete the surgery in July. . Reviewed s/sx of hypoglycemia and hyperglycemia along with recommended interventions.  . Reviewed compliance with recommended DM preventive care appointments. Report completing recommended foot care. Pending a dental exam. Reports not completing her annual eye exam as planned.  Agreed to call within the next few weeks to schedule.    Patient Goals/Self-Care Activities -Self-administer medications as prescribed -Attend all scheduled provider appointments -Follow provider's recommendations for optimal glycemic control -Adhere to prescribed ADA/carb modified -Contact the Optometry clinic to schedule an eye exam. -Notify provider or care management team with questions and new concerns as needed   Follow Up Plan:  Will follow up next month          Tina Short verbalized understanding of the information discussed during the telephonic outreach today. Declined need for mailed/printed instructions. A member of the care management team will follow up with Tina Short within the next month.    Tina Short [facility] Care Management [facility] [phone]

## 2020-07-02 ENCOUNTER — Ambulatory Visit: Payer: Self-pay

## 2020-07-02 DIAGNOSIS — E1140 Type 2 diabetes mellitus with diabetic neuropathy, unspecified: Secondary | ICD-10-CM

## 2020-07-02 DIAGNOSIS — IMO0002 Reserved for concepts with insufficient information to code with codable children: Secondary | ICD-10-CM

## 2020-07-02 DIAGNOSIS — F339 Major depressive disorder, recurrent, unspecified: Secondary | ICD-10-CM | POA: Diagnosis not present

## 2020-07-02 DIAGNOSIS — E1165 Type 2 diabetes mellitus with hyperglycemia: Secondary | ICD-10-CM | POA: Diagnosis not present

## 2020-07-03 ENCOUNTER — Ambulatory Visit: Payer: PPO

## 2020-07-03 NOTE — Chronic Care Management (AMB) (Signed)
Chronic Care Management   Follow Up Note    Name: Tina Short MRN: [id number] DOB: 1955/06/28  Primary Care Provider: Steele Sizer, MD Reason for referral : Chronic Care Management   Tina Short is a 65 y.o. year old female who is a primary care patient of Tina Sizer, MD. She is currently enrolled in the chronic care management program. She contacted the care team today to discuss concerns regarding medications.  Review of Tina Short's status, including review of consultants reports, relevant labs and test results was conducted today. Collaboration with appropriate care team members was performed as part of the comprehensive evaluation and provision of chronic care management services.    SDOH (Social Determinants of Health) assessments performed: No    Outpatient Encounter Medications as of 07/02/2020  Medication Sig Note  . allopurinol (ZYLOPRIM) 100 MG tablet Take 1 tablet (100 mg total) by mouth 2 (two) times daily.   Marland Kitchen atorvastatin (LIPITOR) 40 MG tablet Take 1 tablet (40 mg total) by mouth at bedtime.   . blood glucose meter kit and supplies Dispense based on patient and insurance preference. Use up to four times daily as directed. (FOR ICD-10 E10.9, E11.9).   . clotrimazole (LOTRIMIN) 1 % cream Apply 1 application topically 2 (two) times daily. (Patient not taking: No sig reported)   . divalproex (DEPAKOTE ER) 250 MG 24 hr tablet Take 1 tablet (250 mg total) by mouth daily. (Patient not taking: No sig reported) 05/17/2020: Reports not taking d/t concerns regarding side effects.  . fenofibrate (TRICOR) 145 MG tablet TAKE 1 TABLET(145 MG) BY MOUTH DAILY   . furosemide (LASIX) 40 MG tablet Daily (Patient not taking: Reported on 05/08/2020) 05/17/2020: Reports taking as needed.  Marland Kitchen glipiZIDE (GLUCOTROL XL) 5 MG 24 hr tablet Take 1 tablet (5 mg total) by mouth daily with breakfast.   . hydrocortisone cream 0.5 % Apply 1 application topically 2 (two) times daily. (Patient not  taking: No sig reported)   . losartan (COZAAR) 100 MG tablet TAKE 1 TABLET(100 MG) BY MOUTH DAILY   . melatonin 5 MG TABS Take 5 mg by mouth. (Patient not taking: Reported on 05/23/2020)   . metFORMIN (GLUCOPHAGE-XR) 750 MG 24 hr tablet TAKE 1 TABLET(750 MG) BY MOUTH TWICE DAILY   . ONETOUCH ULTRA test strip TEST UP TO FOUR TIMES A DAY AS DIRECTED   . potassium chloride SA (K-DUR) 20 MEQ tablet TAKE 1 TABLET(20 MEQ) BY MOUTH DAILY (Patient not taking: Reported on 05/08/2020) 05/17/2020: Reports taking only if taking Lasix  . venlafaxine XR (EFFEXOR-XR) 150 MG 24 hr capsule Take 1 capsule (150 mg total) by mouth daily with breakfast.   . vitamin B-12 (CYANOCOBALAMIN) 500 MCG tablet Take 500 mcg by mouth daily. (Patient not taking: Reported on 05/23/2020)   . Vitamin D, Ergocalciferol, (DRISDOL) 1.25 MG (50000 UNIT) CAPS capsule Take 1 capsule (50,000 Units total) by mouth every 7 (seven) days.    No facility-administered encounter medications on file as of 07/02/2020.     Objective:    Patient Care Plan: Medication Management Optimized    Problem Identified: Medication (Wellness)     Goal: Medication Adherence Maintained   Start Date: 07/02/2020  Expected End Date: 07/22/2020  Priority: High  Note:   Current Barriers:  . Chronic disease management support related to medication management.   Case Manager Clinical Goal(s):  Marland Kitchen Over the next 30 days, patient will receive needed medication adjustments to achieve optimal medication management.   Interventions:  .  Collaboration with Tina Sizer, MD regarding development and update of comprehensive plan of care as evidenced by provider attestation and co-signature . Inter-disciplinary care team collaboration (see longitudinal plan of care) . Discussed documentation r/t Ozempic. Pending receipt of medication within 10-14 business days. She was unclear if the medication will be mailed to the clinic or forwarded to her home address. Informed that  her home address is listed on the current [employer] document however she will be contacted if the medication is received in the clinic. Marland Kitchen Discussed concerns regarding the effectiveness of venlafaxine XR (Effexor) Currently prescribed 150 mg/day. She is requesting to consider a different medication if the current dose can not be increased. She used Abilify in the past with excellent symptom management. Abilify was discontinued due to experiencing an allergic reaction/shortness of breath.  . Discussed importance of continued medication compliance until the requested medications are changed or doses are adjusted. Verbalized awareness that she should not discontinue venlafaxine abruptly.   Patient Goals/Self-Care Activities -Self-administer medications as prescribed -Notify provider or care management team with questions and new concerns as needed   Follow Up Plan:  Will follow up within the next month         PLAN Will update PCP/Dr. Ancil Short and CCM Pharmacist/Tina Short. Will follow up with TinaHope within the next month.    Tina Short Health/[facility] Care Management [facility] [phone]

## 2020-07-04 NOTE — Patient Instructions (Signed)
Thank you for allowing the Chronic Care Management team to participate in your care. It was a pleasure speaking with you today. Please feel free to contact me with questions.   Goals Addressed:   Patient Care Plan: Medication Management Optimized    Problem Identified: Medication (Wellness)     Goal: Medication Adherence Maintained   Start Date: 07/02/2020  Expected End Date: 07/22/2020  Priority: High  Note:   Current Barriers:  . Chronic disease management support related to medication management.   Case Manager Clinical Goal(s):  Marland Kitchen Over the next 30 days, patient will receive needed medication adjustments to achieve optimal medication management.   Interventions:  . Collaboration with Tina Sizer, MD regarding development and update of comprehensive plan of care as evidenced by provider attestation and co-signature . Inter-disciplinary care team collaboration (see longitudinal plan of care) . Discussed documentation r/t Ozempic. Pending receipt of medication within 10-14 business days. She was unclear if the medication will be mailed to the clinic or forwarded to her home address. Informed that her home address is listed on the current [employer] document however she will be contacted if the medication is received in the clinic. Marland Kitchen Discussed concerns regarding the effectiveness of venlafaxine XR (Effexor) Currently prescribed 150 mg/day. She is requesting to consider a different medication if the current dose can not be increased. She used Abilify in the past with excellent symptom management. Abilify was discontinued due to experiencing an allergic reaction/shortness of breath.  . Discussed importance of continued medication compliance until the requested medications are changed or doses are adjusted. Verbalized awareness that she should not discontinue venlafaxine abruptly.   Patient Goals/Self-Care Activities -Self-administer medications as prescribed -Notify provider or care  management team with questions and new concerns as needed   Follow Up Plan:  Will follow up within the next month       Tina Short verbalized understanding of the information discussed during the telephonic outreach today. Declined need for mailed/printed instructions.    Will update PCP/Dr. Ancil Short and CCM Pharmacist/Tina Short. Will follow up with Tina Short within the next month.     Tina Short Health[facility] Care Management [facility] [phone]

## 2020-07-10 ENCOUNTER — Ambulatory Visit (INDEPENDENT_AMBULATORY_CARE_PROVIDER_SITE_OTHER): Payer: PPO

## 2020-07-10 ENCOUNTER — Telehealth: Payer: Self-pay

## 2020-07-10 DIAGNOSIS — Z0000 Encounter for general adult medical examination without abnormal findings: Secondary | ICD-10-CM | POA: Diagnosis not present

## 2020-07-10 DIAGNOSIS — Z780 Asymptomatic menopausal state: Secondary | ICD-10-CM
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
## 2020-07-10 NOTE — Progress Notes (Signed)
Left Voice Message to confirmed patient telephone appointment on 07/11/2020 for CCM at 3:00 pm with [name] the Clinical pharmacist.   [place] Pharmacist Assistant [phone]

## 2020-07-10 NOTE — Patient Instructions (Signed)
Tina Short , Thank you for taking time to come for your Medicare Wellness Visit. I appreciate your ongoing commitment to your health goals. Please review the following plan we discussed and let me know if I can assist you in the future.   Screening recommendations/referrals: Colonoscopy: done 04/15/18. Repeat in 2025 Mammogram: done 05/27/16. Please call [phone] to schedule your mammogram and bone density screening.  Bone Density: done 07/13/13 Recommended yearly ophthalmology/optometry visit for glaucoma screening and checkup Recommended yearly dental visit for hygiene and checkup  Vaccinations: Influenza vaccine: done 01/09/20 Pneumococcal vaccine: done 10/28/18 Tdap vaccine: done 01/29/18 Shingles vaccine: Shingrix discussed. Please contact your pharmacy for coverage information.  Covid-19: declined  Advanced directives: Please bring a copy of your health care power of attorney and living will to the office at your convenience once you have completed those documents.   Conditions/risks identified: Recommend healthy eating and physical activity for A1c goal of less than 7%  Next appointment: Follow up in one year for your annual wellness visit    Preventive Care 65 Years and Older, Female Preventive care refers to lifestyle choices and visits with your health care provider that can promote health and wellness. What does preventive care include?  A yearly physical exam. This is also called an annual well check.  Dental exams once or twice a year.  Routine eye exams. Ask your health care provider how often you should have your eyes checked.  Personal lifestyle choices, including:  Daily care of your teeth and gums.  Regular physical activity.  Eating a healthy diet.  Avoiding tobacco and drug use.  Limiting alcohol use.  Practicing safe sex.  Taking low-dose aspirin every day.  Taking vitamin and mineral supplements as recommended by your health care provider. What  happens during an annual well check? The services and screenings done by your health care provider during your annual well check will depend on your age, overall health, lifestyle risk factors, and family history of disease. Counseling  Your health care provider may ask you questions about your:  Alcohol use.  Tobacco use.  Drug use.  Emotional well-being.  Home and relationship well-being.  Sexual activity.  Eating habits.  History of falls.  Memory and ability to understand (cognition).  Work and work Statistician.  Reproductive health. Screening  You may have the following tests or measurements:  Height, weight, and BMI.  Blood pressure.  Lipid and cholesterol levels. These may be checked every 5 years, or more frequently if you are over 28 years old.  Skin check.  Lung cancer screening. You may have this screening every year starting at age 42 if you have a 30-pack-year history of smoking and currently smoke or have quit within the past 15 years.  Fecal occult blood test (FOBT) of the stool. You may have this test every year starting at age 36.  Flexible sigmoidoscopy or colonoscopy. You may have a sigmoidoscopy every 5 years or a colonoscopy every 10 years starting at age 65.  Hepatitis C blood test.  Hepatitis B blood test.  Sexually transmitted disease (STD) testing.  Diabetes screening. This is done by checking your blood sugar (glucose) after you have not eaten for a while (fasting). You may have this done every 1-3 years.  Bone density scan. This is done to screen for osteoporosis. You may have this done starting at age 57.  Mammogram. This may be done every 1-2 years. Talk to your health care provider about how often you should  have regular mammograms. Talk with your health care provider about your test results, treatment options, and if necessary, the need for more tests. Vaccines  Your health care provider may recommend certain vaccines, such  as:  Influenza vaccine. This is recommended every year.  Tetanus, diphtheria, and acellular pertussis (Tdap, Td) vaccine. You may need a Td booster every 10 years.  Zoster vaccine. You may need this after age 15.  Pneumococcal 13-valent conjugate (PCV13) vaccine. One dose is recommended after age 32.  Pneumococcal polysaccharide (PPSV23) vaccine. One dose is recommended after age 57. Talk to your health care provider about which screenings and vaccines you need and how often you need them. This information is not intended to replace advice given to you by your health care provider. Make sure you discuss any questions you have with your health care provider. Document Released: 02/23/2015 Document Revised: 10/17/2015 Document Reviewed: 11/28/2014 Elsevier Interactive Patient Education  2017 [place] Prevention in the Home Falls can cause injuries. They can happen to people of all ages. There are many things you can do to make your home safe and to help prevent falls. What can I do on the outside of my home?  Regularly fix the edges of walkways and driveways and fix any cracks.  Remove anything that might make you trip as you walk through a door, such as a raised step or threshold.  Trim any bushes or trees on the path to your home.  Use bright outdoor lighting.  Clear any walking paths of anything that might make someone trip, such as rocks or tools.  Regularly check to see if handrails are loose or broken. Make sure that both sides of any steps have handrails.  Any raised decks and porches should have guardrails on the edges.  Have any leaves, snow, or ice cleared regularly.  Use sand or salt on walking paths during winter.  Clean up any spills in your garage right away. This includes oil or grease spills. What can I do in the bathroom?  Use night lights.  Install grab bars by the toilet and in the tub and shower. Do not use towel bars as grab bars.  Use  non-skid mats or decals in the tub or shower.  If you need to sit down in the shower, use a plastic, non-slip stool.  Keep the floor dry. Clean up any water that spills on the floor as soon as it happens.  Remove soap buildup in the tub or shower regularly.  Attach bath mats securely with double-sided non-slip rug tape.  Do not have throw rugs and other things on the floor that can make you trip. What can I do in the bedroom?  Use night lights.  Make sure that you have a light by your bed that is easy to reach.  Do not use any sheets or blankets that are too big for your bed. They should not hang down onto the floor.  Have a firm chair that has side arms. You can use this for support while you get dressed.  Do not have throw rugs and other things on the floor that can make you trip. What can I do in the kitchen?  Clean up any spills right away.  Avoid walking on wet floors.  Keep items that you use a lot in easy-to-reach places.  If you need to reach something above you, use a strong step stool that has a grab bar.  Keep electrical cords out of  the way.  Do not use floor polish or wax that makes floors slippery. If you must use wax, use non-skid floor wax.  Do not have throw rugs and other things on the floor that can make you trip. What can I do with my stairs?  Do not leave any items on the stairs.  Make sure that there are handrails on both sides of the stairs and use them. Fix handrails that are broken or loose. Make sure that handrails are as long as the stairways.  Check any carpeting to make sure that it is firmly attached to the stairs. Fix any carpet that is loose or worn.  Avoid having throw rugs at the top or bottom of the stairs. If you do have throw rugs, attach them to the floor with carpet tape.  Make sure that you have a light switch at the top of the stairs and the bottom of the stairs. If you do not have them, ask someone to add them for you. What  else can I do to help prevent falls?  Wear shoes that:  Do not have high heels.  Have rubber bottoms.  Are comfortable and fit you well.  Are closed at the toe. Do not wear sandals.  If you use a stepladder:  Make sure that it is fully opened. Do not climb a closed stepladder.  Make sure that both sides of the stepladder are locked into place.  Ask someone to hold it for you, if possible.  Clearly mark and make sure that you can see:  Any grab bars or handrails.  First and last steps.  Where the edge of each step is.  Use tools that help you move around (mobility aids) if they are needed. These include:  Canes.  Walkers.  Scooters.  Crutches.  Turn on the lights when you go into a dark area. Replace any light bulbs as soon as they burn out.  Set up your furniture so you have a clear path. Avoid moving your furniture around.  If any of your floors are uneven, fix them.  If there are any pets around you, be aware of where they are.  Review your medicines with your doctor. Some medicines can make you feel dizzy. This can increase your chance of falling. Ask your doctor what other things that you can do to help prevent falls. This information is not intended to replace advice given to you by your health care provider. Make sure you discuss any questions you have with your health care provider. Document Released: 11/23/2008 Document Revised: 07/05/2015 Document Reviewed: 03/03/2014 Elsevier Interactive Patient Education  2017 [employer].

## 2020-07-11 ENCOUNTER — Telehealth: Payer: Self-pay

## 2020-07-11 ENCOUNTER — Ambulatory Visit (INDEPENDENT_AMBULATORY_CARE_PROVIDER_SITE_OTHER): Payer: PPO

## 2020-07-11 DIAGNOSIS — E1140 Type 2 diabetes mellitus with diabetic neuropathy, unspecified: Secondary | ICD-10-CM

## 2020-07-11 DIAGNOSIS — E1159 Type 2 diabetes mellitus with other circulatory complications: Secondary | ICD-10-CM

## 2020-07-11 DIAGNOSIS — I152 Hypertension secondary to endocrine disorders: Secondary | ICD-10-CM | POA: Diagnosis not present

## 2020-07-11 DIAGNOSIS — F339 Major depressive disorder, recurrent, unspecified: Secondary | ICD-10-CM

## 2020-07-11 DIAGNOSIS — E669 Obesity, unspecified: Secondary | ICD-10-CM

## 2020-07-11 DIAGNOSIS — E785 Hyperlipidemia, unspecified: Secondary | ICD-10-CM | POA: Diagnosis not present

## 2020-07-11 DIAGNOSIS — IMO0002 Reserved for concepts with insufficient information to code with codable children: Secondary | ICD-10-CM

## 2020-07-11 DIAGNOSIS — E1169 Type 2 diabetes mellitus with other specified complication: Secondary | ICD-10-CM

## 2020-07-11 DIAGNOSIS — E1165 Type 2 diabetes mellitus with hyperglycemia: Secondary | ICD-10-CM | POA: Diagnosis not present

## 2020-07-11 DIAGNOSIS — G4701 Insomnia due to medical condition: Secondary | ICD-10-CM

## 2020-07-11 MED ORDER — BUPROPION HCL ER (XL) 150 MG PO TB24: 150.0000 mg | ORAL_TABLET | Freq: Every day | ORAL | 0 refills | Status: DC

## 2020-07-11 MED ORDER — OZEMPIC (0.25 OR 0.5 MG/DOSE) 2 MG/1.5ML ~~LOC~~ SOPN: 0.2500 mg | PEN_INJECTOR | SUBCUTANEOUS | Status: DC

## 2020-07-11 NOTE — Telephone Encounter (Signed)
Called patient to confirm that Per Sowles and Nurse Larene Beach appt needs to be in office. Patient understood

## 2020-07-11 NOTE — Progress Notes (Signed)
Chronic Care Management Pharmacy Note  07/11/2020 Name:  Tina Short MRN:  [id number] DOB:  1956/01/28  Summary: Patient reports she was approved for Ozempic, but has yet to start the medication. She also reports worsening depression and she continues to experience poor sleep. She stopped Depakote and continues to take melatonin, but it provides no relief.   Recommendations/Changes made from today's visit: START Ozempic 0.25 mg weekly for 4 weeks  STOP Depakote (Patient no longer taking - provided no relief)  CONTINUE all other medications  Plan: PCP follow-up in 2 weeks [name] follow-up in 4 weeks   Subjective: Tina Short is an 65 y.o. year old female who is a primary patient of Steele Sizer, MD.  The CCM team was consulted for assistance with disease management and care coordination needs.    Engaged with patient by telephone for follow up visit in response to provider referral for pharmacy case management and/or care coordination services.   Consent to Services:  The patient was given information about Chronic Care Management services, agreed to services, and gave verbal consent prior to initiation of services.  Please see initial visit note for detailed documentation.   Patient Care Team: Steele Sizer, MD as PCP - General (Family Medicine) Steele Sizer, MD as Attending Physician (Family Medicine) Neldon Labella, RN as Case Manager [place], Darla Lesches, LCSW as Social Worker Germaine Pomfret, [place] (Pharmacist) Lovell Sheehan, MD as Consulting Physician (Orthopedic Surgery)  Recent office visits: 05/08/20: Patient presented to Dr. Ancil Boozer for follow-up. A1c improved to 7.7%.Patient started on divalproex 250 mg daily.   Recent consult visits: None in previous 6 months  Hospital visits: None in previous 6 months  Objective:  Lab Results  Component Value Date   CREATININE 0.63 05/08/2020   BUN 12 05/08/2020   GFRNONAA >60 04/25/2019   GFRAA >60 04/25/2019    NA 141 05/08/2020   K 4.3 05/08/2020   CALCIUM 9.6 05/08/2020   CO2 19 (L) 05/08/2020   GLUCOSE 209 (H) 05/08/2020    Lab Results  Component Value Date/Time   HGBA1C 7.7 (H) 05/08/2020 01:10 PM   HGBA1C 8.0 (A) 01/09/2020 12:06 PM   HGBA1C 6.8 (A) 09/07/2019 09:55 AM   HGBA1C 8.5 (H) 03/18/2019 10:45 AM   HGBA1C 6.7 01/29/2018 11:42 AM   HGBA1C 6.7 (A) 01/29/2018 11:42 AM   HGBA1C 6.7 01/29/2018 11:42 AM   FRUCTOSAMINE 266 09/12/2014 09:12 AM   MICROALBUR 50 09/12/2014 08:14 AM    Last diabetic Eye exam:  Lab Results  Component Value Date/Time   HMDIABEYEEXA No Retinopathy 10/08/2017 12:00 AM    Last diabetic Foot exam: No results found for: HMDIABFOOTEX   Lab Results  Component Value Date   CHOL 189 05/08/2020   HDL 31 (L) 05/08/2020   LDLCALC 96 05/08/2020   TRIG 368 (H) 05/08/2020   CHOLHDL 6.1 (H) 05/08/2020    Hepatic Function Latest Ref Rng & Units 05/08/2020 04/22/2019 04/21/2019  Total Protein 6.0 - 8.5 g/dL 7.5 7.6 8.3(H)  Albumin 3.8 - 4.8 g/dL 4.6 3.5 3.7  AST 0 - 40 IU/L 39 37 38  ALT 0 - 32 IU/L 31 29 32  Alk Phosphatase 44 - 121 IU/L 60 55 55  Total Bilirubin 0.0 - 1.2 mg/dL 0.8 1.1 1.5(H)    Lab Results  Component Value Date/Time   TSH 1.380 07/26/2015 11:19 AM   TSH 0.95 05/26/2014 12:00 AM    CBC Latest Ref Rng & Units 05/08/2020 04/25/2019 04/24/2019  WBC 3.4 - 10.8 x10E3/uL 5.5 4.8 4.3  Hemoglobin 11.1 - 15.9 g/dL 14.5 12.2 13.1  Hematocrit 34.0 - 46.6 % 42.5 34.9(L) 38.2  Platelets 150 - 450 x10E3/uL 185 144(L) 146(L)    Lab Results  Component Value Date/Time   VD25OH 20.4 (L) 03/18/2019 10:45 AM   VD25OH 23.9 (L) 10/26/2018 11:23 AM    Clinical ASCVD: No  The 10-year ASCVD risk score Mikey Bussing DC Jr., et al., 2013) is: 19.4%   Values used to calculate the score:     Age: 65 years     Sex: Female     Is Non-Hispanic African American: No     Diabetic: Yes     Tobacco smoker: No     Systolic Blood Pressure: 888 mmHg     Is BP treated:  Yes     HDL Cholesterol: 31 mg/dL     Total Cholesterol: 189 mg/dL    Depression screen [facility] 2/9 07/10/2020 05/21/2020 05/17/2020  Decreased Interest _0 Down, Depressed, Hopeless _1 PHQ - 2 Score _2 Altered sleeping 3 2 -  Tired, decreased energy 3 2 -  Change in appetite 0 3 -  Feeling bad or failure about yourself  2 2 -  Trouble concentrating 0 0 -  Moving slowly or fidgety/restless 0 0 -  Suicidal thoughts 0 0 -  PHQ-9 Score 14 11 -  Difficult doing work/chores Somewhat difficult - -  Some recent data might be hidden     Social History   Tobacco Use  Smoking Status Never Smoker  Smokeless Tobacco Never Used   BP Readings from Last 3 Encounters:  05/08/20 132/74  01/09/20 118/76  09/07/19 120/78   Pulse Readings from Last 3 Encounters:  05/08/20 95  01/09/20 96  09/07/19 105   Wt Readings from Last 3 Encounters:  05/08/20 284 lb (128.8 kg)  01/09/20 276 lb 11.2 oz (125.5 kg)  09/07/19 (!) 274 lb 3.2 oz (124.4 kg)   BMI Readings from Last 3 Encounters:  05/08/20 43.18 kg/m  01/09/20 42.07 kg/m  09/07/19 41.69 kg/m    Assessment/Interventions: Review of patient past medical history, allergies, medications, health status, including review of consultants reports, laboratory and other test data, was performed as part of comprehensive evaluation and provision of chronic care management services.   SDOH:  (Social Determinants of Health) assessments and interventions performed: Yes  SDOH Screenings   Alcohol Screen: Low Risk   . Last Alcohol Screening Score (AUDIT): 0  Depression (PHQ2-9): Medium Risk  . PHQ-2 Score: 14  Financial Resource Strain: High Risk  . Difficulty of Paying Living Expenses: Hard  Food Insecurity: No Food Insecurity  . Worried About Charity fundraiser in the Last Year: Never true  . Ran Out of Food in the Last Year: Never true  Housing: Low Risk   . Last Housing Risk Score: 0  Physical Activity: Inactive  . Days of Exercise  per Week: 0 days  . Minutes of Exercise per Session: 0 min  Social Connections: Moderately Integrated  . Frequency of Communication with Friends and Family: More than three times a week  . Frequency of Social Gatherings with Friends and Family: More than three times a week  . Attends Religious Services: More than 4 times per year  . Active Member of Clubs or Organizations: No  . Attends Archivist Meetings: Never  . Marital Status: Married  Stress: Stress Concern Present  .  Feeling of Stress : To some extent  Tobacco Use: Low Risk   . Smoking Tobacco Use: Never Smoker  . Smokeless Tobacco Use: Never Used  Transportation Needs: No Transportation Needs  . Lack of Transportation (Medical): No  . Lack of Transportation (Non-Medical): No    CCM Care Plan  Allergies  Allergen Reactions  . Abilify [Aripiprazole] Shortness Of Breath    Other reaction(s): Difficulty breathing  . Invokana [Canagliflozin] Itching    Yeast infections  . Metformin And Related     Diarrhea, but doing well with XR formulation    . Lovaza [Omega-3-Acid Ethyl Esters] Other (See Comments)    Burping     Medications Reviewed Today    Reviewed by Clemetine Marker, LPN (Licensed Practical Nurse) on 07/10/20 at [place]  Med List Status: <None>  Medication Order Taking? Sig Documenting Provider Last Dose Status Informant  allopurinol (ZYLOPRIM) 100 MG tablet [id number] Yes Take 1 tablet (100 mg total) by mouth 2 (two) times daily. Steele Sizer, MD Taking Active   atorvastatin (LIPITOR) 40 MG tablet [id number] Yes Take 1 tablet (40 mg total) by mouth at bedtime. Steele Sizer, MD Taking Active   blood glucose meter kit and supplies [id number] Yes Dispense based on patient and insurance preference. Use up to four times daily as directed. (FOR ICD-10 E10.9, E11.9). Steele Sizer, MD Taking Active Self  divalproex (DEPAKOTE ER) 250 MG 24 hr tablet [id number] Yes Take 1 tablet (250 mg total) by mouth daily. Steele Sizer, MD Taking Active            Med Note Alleen Borne   Tue Jul 10, 2020  3:03 PM)    fenofibrate (TRICOR) 145 MG tablet [id number] Yes TAKE 1 TABLET(145 MG) BY MOUTH DAILY Steele Sizer, MD Taking Active   furosemide (LASIX) 40 MG tablet [id number] Yes Daily Steele Sizer, MD Taking Active            Med Note Minerva Ends, [facility] N   Thu May 17, 2020  2:37 PM) Reports taking as needed.  glipiZIDE (GLUCOTROL XL) 5 MG 24 hr tablet [id number] Yes Take 1 tablet (5 mg total) by mouth daily with breakfast. Steele Sizer, MD Taking Active   losartan (COZAAR) 100 MG tablet [id number] Yes TAKE 1 TABLET(100 MG) BY MOUTH DAILY Steele Sizer, MD Taking Active   melatonin 5 MG TABS [id number] Yes Take 5 mg by mouth. [provider] Taking Active   metFORMIN (GLUCOPHAGE-XR) 750 MG 24 hr tablet [id number] Yes TAKE 1 TABLET(750 MG) BY MOUTH TWICE DAILY Steele Sizer, MD Taking Active            Med Note Alleen Borne   Tue Jul 10, 2020  3:06 PM) Pt taking 2 tabs QAM  ONETOUCH ULTRA test strip [id number] Yes TEST UP TO FOUR TIMES A DAY AS DIRECTED Steele Sizer, MD Taking Active   potassium chloride SA (K-DUR) 20 MEQ tablet [id number] Yes TAKE 1 TABLET(20 MEQ) BY MOUTH DAILY Steele Sizer, MD Taking Active            Med Note [facility], FELECIA N   Thu May 17, 2020  2:37 PM) Reports taking only if taking Lasix  venlafaxine XR (EFFEXOR-XR) 150 MG 24 hr capsule [id number] Yes Take 1 capsule (150 mg total) by mouth daily with breakfast. Steele Sizer, MD Taking Active   vitamin B-12 (CYANOCOBALAMIN) 500 MCG tablet [id number] No Take 500 mcg by mouth daily.  Patient not taking: No sig reported   [provider] Not Taking Active   Vitamin D, Ergocalciferol, (DRISDOL) 1.25 MG (50000 UNIT) CAPS capsule [id number] Yes Take 1 capsule (50,000 Units total) by mouth every 7 (seven) days. Steele Sizer, MD Taking Active           Patient Active Problem List   Diagnosis Date Noted  .  Esophageal dysphagia   . Colon cancer screening 04/30/2016  . Major depression, recurrent, chronic ([place]) 10/30/2015  . Dyslipidemia associated with type 2 diabetes mellitus ([place]) 10/30/2015  . Vitamin D deficiency 07/27/2015  . Vitamin B12 deficiency 07/27/2015  . History of anemia 09/12/2014  . Carpal tunnel syndrome 09/12/2014  . Obstructive sleep apnea 09/12/2014  . Edema of extremities 09/12/2014  . Gout 09/12/2014  . Morbid obesity, unspecified obesity type ([place]) 11/15/2009  . Benign essential HTN 10/07/2006  . Type 2 diabetes mellitus, uncontrolled, with neuropathy ([place]) 10/07/2006  . Hyperlipidemia 10/07/2006    Immunization History  Administered Date(s) Administered  . Influenza Nasal 10/28/2018  . Influenza,inj,Quad PF,6+ Mos 11/27/2016, 01/29/2018, 01/09/2020  . Pneumococcal Conjugate-13 05/02/2013  . Pneumococcal Polysaccharide-23 12/27/2014, 10/28/2018  . Tdap 11/15/2007, 01/29/2018  . Zoster, Live 01/23/2016    Conditions to be addressed/monitored:  Hypertension, Hyperlipidemia, Diabetes, GERD, Depression, Osteoarthritis and Gout  There are no care plans that you recently modified to display for this patient.    Medication Assistance: Ozempicobtained through [employer] medication assistance program.  Enrollment ends Dec 2022  Compliance/Adherence/Medication fill history: Care Gaps: Shingrix Ophthalmology exam  Star-Rating Drugs: Atorvastatin: LF 06/08/20 for 90-DS Glipizide: LF 06/15/20 for 90-DS Losartan: LF 06/15/20 for 90-DS Metformin XR: 06/21/20 for 90-DS   Patient's preferred pharmacy is:  [facility] [address] [place] [address] Phone: [phone] Fax: [phone]  Uses pill box? Yes Pt endorses 100% compliance  We discussed: Current pharmacy is preferred with insurance plan and patient is satisfied with pharmacy  services Patient decided to: Continue current medication management strategy  Care Plan and Follow Up Patient Decision:  Patient agrees to Care Plan and Follow-up.  Plan: Telephone follow up appointment with care management team member scheduled for:  08/16/2020 at 2:00 PM and Next PCP appointment scheduled for:  07/26/2020 at 11:40 AM  Doristine Section, [facility] [phone]

## 2020-07-11 NOTE — Telephone Encounter (Signed)
Copied from [place] [phone]. Topic: Appointment Scheduling - Scheduling Inquiry for Clinic >> Jul 11, 2020  3:56 PM Alanda Slim E wrote: Reason for CRM: Pt called to see if her appt for 6.16.22 needs to be in office or if it can be virtual / please advise

## 2020-07-12 NOTE — Patient Instructions (Addendum)
Visit Information It was great speaking with you today!  Please let me know if you have any questions about our visit.  Goals Addressed            This Visit's Progress   . Monitor and Manage My Blood Sugar-Diabetes Type 2       Timeframe:  Long-Range Goal Priority:  High Start Date: 07/12/2020                            Expected End Date: 01/11/2021                      Follow Up Date 08/16/2020    - check blood sugar at prescribed times - check blood sugar if I feel it is too high or too low - enter blood sugar readings and medication or insulin into daily log - take the blood sugar log to all doctor visits    Why is this important?    Checking your blood sugar at home helps to keep it from getting very high or very low.   Writing the results in a diary or log helps the doctor know how to care for you.   Your blood sugar log should have the time, date and the results.   Also, write down the amount of insulin or other medicine that you take.   Other information, like what you ate, exercise done and how you were feeling, will also be helpful.     Notes:     . Track and Manage My Blood Pressure-Hypertension   On track    Timeframe:  Long-Range Goal Priority:  High Start Date: 05/23/2020                             Expected End Date:  11/22/20                     Follow Up Date 08/16/2020    - check blood pressure weekly    Why is this important?    You won't feel high blood pressure, but it can still hurt your blood vessels.   High blood pressure can cause heart or kidney problems. It can also cause a stroke.   Making lifestyle changes like losing a little weight or eating less salt will help.   Checking your blood pressure at home and at different times of the day can help to control blood pressure.   If the doctor prescribes medicine remember to take it the way the doctor ordered.   Call the office if you cannot afford the medicine or if there are questions about  it.     Notes:        Patient Care Plan: General Pharmacy (Adult)    Problem Identified: Hypertension, Hyperlipidemia, Diabetes, GERD, Depression, Osteoarthritis and Gout   Priority: High    Long-Range Goal: Patient-Specific Goal   Start Date: 05/23/2020  Expected End Date: 11/22/2020  This Visit's Progress: On track  Recent Progress: On track  Priority: High  Note:   Current Barriers:  . Suboptimal therapeutic regimen for Diabetes  . Suboptimal therapeutic regimen for Cholesterol  Pharmacist Clinical Goal(s):  Marland Kitchen Patient will achieve control of cholesterol as evidenced by LDL less than 100, TG less than 150 . maintain control of diabetes as evidenced by A1c less than 8%  through collaboration with PharmD and  provider.   Interventions: . 1:1 collaboration with Steele Sizer, MD regarding development and update of comprehensive plan of care as evidenced by provider attestation and co-signature . Inter-disciplinary care team collaboration (see longitudinal plan of care) . Comprehensive medication review performed; medication list updated in electronic medical record  Hypertension (BP goal <140/90) -Controlled -Current treatment: . Losartan 100 mg daily  . Furosemide 40 mg daily as needed -Medications previously tried: NA  -Current home readings: NA -Denies hypotensive/hypertensive symptoms -Educated on Exercise goal of 150 minutes per week; Importance of home blood pressure monitoring; Proper BP monitoring technique; -Counseled to monitor BP at home weekly, document, and provide log at future appointments -Recommended to continue current medication  Hyperlipidemia: (LDL goal < 100) -Not ideally controlled -Current treatment: . Atorvastatin 40 mg at bedtime  . Fenofibrate 145 mg daily  -Medications previously tried: Lovaza  -Educated on Importance of limiting foods high in cholesterol; -Recommended to continue current medication  Diabetes (A1c goal  <8%) -Diagnosed: 1995  -Controlled -Current medications: Marland Kitchen Glipizide XL 5 mg daily  . Metformin XR 750 mg two tablets daily  -Medications previously tried: Trulicity, Glimepiride, Ozempic (Cost), Invokana, .  -Current home glucose readings . fasting glucose: 130 . post prandial glucose: NA -Reports  hyperglycemic symptoms: polyphagia, fatigue -Patient reported she was approved for Ozempic through December 2022, but has not started the medication yet as she was unsure what dose to take and whether to continue glipizide.  -Recommend starting Ozempic 0.25 mg weekly for four weeks to ensure tolerability. Will plan to stop glipizide in one month once patient reaches therapeutic dose of Ozempic.   Depression/Insomnia (Goal: Maintain mood, improve sleep quality) -Uncontrolled -Current treatment: . Melatonin 5 mg nightly  . Venlafaxine XR 150 mg daily  -Medications previously tried/failed: Fluoxetine, Abilify (shortness of breath), Depakote (ineffective) -PHQ9: 14 -GAD7: NA -Some nights she is completely unable to sleep, some nights sleep 12 hours.  -Educated on Benefits of medication for symptom control  -Counseled on proper sleep hygiene  -Patient discontinued Depakote, as she felt no improvement on the medication.  -START Wellbutrin XL 150 mg daily with breakfast. Counseled patient on mechanism of action, frequently reported side effects and answered patient's questions.   Gout (Goal: prevent gout flares) -Controlled -Current treatment  . Allopurinol 100 mg twice daily  -Medications previously tried: NA  -Last gout flare: > 5 years  -Recommended to continue current medication  Osteoarthritis (Goal: Minimize pain and improve quality of life ) -Controlled -Current treatment  . Ibuprofen 200-400 mg q6hr PRN moderate or severe arthritis  -Medications previously tried: NA  -Counseled on minimizing NSAID use, risks of chronic NSAID use -Recommended to continue current  medication  Patient Goals/Self-Care Activities . Patient will:  - check glucose daily, document, and provide at future appointments check blood pressure weekly, document, and provide at future appointments  Follow Up Plan: Telephone follow up appointment with care management team member scheduled for:  08/16/2020 at 2:00 PM      Patient agreed to services and verbal consent obtained.   The patient verbalized understanding of instructions, educational materials, and care plan provided today and declined offer to receive copy of patient instructions, educational materials, and care plan.   Doristine Section, [place] [facility] [phone]

## 2020-07-19 ENCOUNTER — Other Ambulatory Visit: Payer: Self-pay | Admitting: Family Medicine

## 2020-07-19 DIAGNOSIS — E1140 Type 2 diabetes mellitus with diabetic neuropathy, unspecified: Secondary | ICD-10-CM

## 2020-07-25 NOTE — Progress Notes (Signed)
[name]   MRN: [id number]    DOB: 06/03/1955   Date:07/26/2020       Progress Note  Subjective  Chief Complaint  Follow up   HPI  DMII: she has a history of DM for many years, A1C was 6.8% and last visit was up to 8 % today is down to 6.8%. She denies  polydipsia, no  polyuria. She takes statin, she stopped taking Lovaza ( due to size and bad taste)  and is back on fenofibrate , Losartan ( [facility]) for microabuminuria She has neuropathy, she denies pain just has numbness and seems to be doing better. .She cannot  tolerate SGL-2 agonist because it caused yeast infection . She has been trying to eat healthy, but has been craving more carb lately    Morbid obesity: weight is down again, back on Ozempic down to 0.25 mg weekly for the month but will titrate up to 0.5 mg in 2 weeks, she is getting it through assistance program    Major Depression: Her father committed suicide in his 32's and there are other family members with history of psychiatric illness. She denies suicidal thoughts, but would be fine if she died, she states that is a chronic feeling, she states she would never do it. No planning. She still feels hurt about the break down of her previous church - where she was a member for almost 32 years. She saw me in March and was feeling much worse, crying more often. We tried Abilify and Depakote but she stopped because it was not working. We added Wellbutrin, but she is up most of the night and falls asleep around 7 am and gets up around 11 am, that is when she finally takes Wellbutrin, advised to take it around 6 am when she awake, try not to go back to bed. She is also on Effexor. Phq 9 is slightly better, but explained to her how important it is for her to see a psychiatrist and therapist but she continues to refuse at this time   OSA: she uses CPAP every night. She had a repeat study, compliant with treatment. She states that if she does not use CPAP she feels shaky and has a  headache when she wakes up.   B12 deficiency: she is back on B12 supplementation    Gout: no recent episodes, she is now on Allopurinol twice daily and denies side effects of medication, she is compliant with medication. No recent flares . Continue medication   Vitamin D: she has rx vitamin D at home , she has been compliant    HTN: she is taking medications, bp is at goal, no chest pain, palpitation. BP is at goal   OA knee: worse on right side, daily pain, worse with activity or standing, she was seen at [facility] and is having right knee replacement July 13 th, 2022    Patient Active Problem List   Diagnosis Date Noted   Esophageal dysphagia    Colon cancer screening 04/30/2016   Major depression, recurrent, chronic ([place]) 10/30/2015   Dyslipidemia associated with type 2 diabetes mellitus ([place]) 10/30/2015   Vitamin D deficiency 07/27/2015   Vitamin B12 deficiency 07/27/2015   History of anemia 09/12/2014   Carpal tunnel syndrome 09/12/2014   Obstructive sleep apnea 09/12/2014   Edema of extremities 09/12/2014   Gout 09/12/2014   Morbid obesity, unspecified obesity type ([place]) 11/15/2009   Benign essential HTN 10/07/2006   Type 2 diabetes mellitus,  uncontrolled, with neuropathy ([place]) 10/07/2006   Hyperlipidemia 10/07/2006    Past Surgical History:  Procedure Laterality Date   ABDOMINAL HYSTERECTOMY     COLONOSCOPY     COLONOSCOPY WITH PROPOFOL N/A 05/14/2016   Procedure: COLONOSCOPY WITH PROPOFOL;  Surgeon: [name], MD;  Location: [facility] ENDOSCOPY;  Service: Endoscopy;  Laterality: N/A;   COLONOSCOPY WITH PROPOFOL N/A 04/15/2018   Procedure: COLONOSCOPY WITH PROPOFOL;  Surgeon: Lin Landsman, MD;  Location: [facility] ENDOSCOPY;  Service: Gastroenterology;  Laterality: N/A;   ESOPHAGOGASTRODUODENOSCOPY (EGD) WITH PROPOFOL N/A 04/15/2018   Procedure: ESOPHAGOGASTRODUODENOSCOPY (EGD) WITH PROPOFOL;  Surgeon: Lin Landsman, MD;  Location[facility] ENDOSCOPY;  Service:  Gastroenterology;  Laterality: N/A;   FOOT SURGERY Right    X2   SHOULDER SURGERY Right    WRIST SURGERY Bilateral     Family History  Problem Relation Age of Onset   Diabetes Mother    Cancer Mother        uterine   Mental illness Father    Obesity Daughter    Diabetes Maternal Grandmother    Heart disease Brother    Obesity Daughter     Social History   Tobacco Use   Smoking status: Never   Smokeless tobacco: Never  Substance Use Topics   Alcohol use: No    Alcohol/week: 0.0 standard drinks     Current Outpatient Medications:    allopurinol (ZYLOPRIM) 100 MG tablet, Take 1 tablet (100 mg total) by mouth 2 (two) times daily., Disp: 180 tablet, Rfl: 1   atorvastatin (LIPITOR) 40 MG tablet, Take 1 tablet (40 mg total) by mouth at bedtime., Disp: 90 tablet, Rfl: 1   blood glucose meter kit and supplies, Dispense based on patient and insurance preference. Use up to four times daily as directed. (FOR ICD-10 E10.9, E11.9)., Disp: 1 each, Rfl: 0   buPROPion (WELLBUTRIN XL) 150 MG 24 hr tablet, Take 1 tablet (150 mg total) by mouth daily with breakfast., Disp: 30 tablet, Rfl: 0   fenofibrate (TRICOR) 145 MG tablet, TAKE 1 TABLET(145 MG) BY MOUTH DAILY, Disp: 90 tablet, Rfl: 1   furosemide (LASIX) 40 MG tablet, Daily, Disp: 90 tablet, Rfl: 1   glipiZIDE (GLUCOTROL XL) 5 MG 24 hr tablet, TAKE 1 TABLET(5 MG) BY MOUTH DAILY WITH BREAKFAST, Disp: 90 tablet, Rfl: 1   losartan (COZAAR) 100 MG tablet, TAKE 1 TABLET(100 MG) BY MOUTH DAILY, Disp: 90 tablet, Rfl: 0   melatonin 5 MG TABS, Take 5 mg by mouth at bedtime., Disp: , Rfl:    metFORMIN (GLUCOPHAGE-XR) 750 MG 24 hr tablet, TAKE 1 TABLET(750 MG) BY MOUTH TWICE DAILY, Disp: 180 tablet, Rfl: 1   ONETOUCH ULTRA test strip, TEST UP TO FOUR TIMES A DAY AS DIRECTED, Disp: 300 strip, Rfl: 0   potassium chloride SA (K-DUR) 20 MEQ tablet, TAKE 1 TABLET(20 MEQ) BY MOUTH DAILY, Disp: 90 tablet, Rfl: 0   Semaglutide,0.25 or 0.5MG/DOS, (OZEMPIC,  0.25 OR 0.5 MG/DOSE,) 2 MG/1.5ML SOPN, Inject 0.25 mg into the skin once a week. Through [employer] PAP, Disp: 1.5 mL, Rfl:    venlafaxine XR (EFFEXOR-XR) 150 MG 24 hr capsule, Take 1 capsule (150 mg total) by mouth daily with breakfast., Disp: 90 capsule, Rfl: 1   vitamin B-12 (CYANOCOBALAMIN) 500 MCG tablet, Take 500 mcg by mouth daily., Disp: , Rfl:    Vitamin D, Ergocalciferol, (DRISDOL) 1.25 MG (50000 UNIT) CAPS capsule, Take 1 capsule (50,000 Units total) by mouth every 7 (seven) days. (Patient  not taking: Reported on 07/26/2020), Disp: 12 capsule, Rfl: 1  Allergies  Allergen Reactions   Abilify [Aripiprazole] Shortness Of Breath    Other reaction(s): Difficulty breathing   Invokana [Canagliflozin] Itching    Yeast infections   Metformin And Related     Diarrhea, but doing well with XR formulation     Lovaza [Omega-3-Acid Ethyl Esters] Other (See Comments)    Burping     I personally reviewed active problem list, medication list, allergies, family history, social history with the patient/caregiver today.   ROS  Constitutional: Negative for fever , positive for mild  weight change.  Respiratory: Negative for cough and shortness of breath.   Cardiovascular: Negative for chest pain or palpitations.  Gastrointestinal: Negative for abdominal pain, no bowel changes.  Musculoskeletal: Negative for gait problem or joint swelling.  Skin: Negative for rash.  Neurological: Negative for dizziness or headache.  No other specific complaints in a complete review of systems (except as listed in HPI above).   Objective  Vitals:   07/26/20 1200  BP: 126/72  Pulse: 93  Resp: 20  Temp: 98.1 F (36.7 C)  TempSrc: Oral  SpO2: 98%  Weight: 278 lb 14.4 oz (126.5 kg)  Height: _0  (1.727 m)    Body mass index is 42.41 kg/m.  Physical Exam  Constitutional: Patient appears well-developed and well-nourished. Obese  No distress.  HEENT: head atraumatic, normocephalic, pupils equal and  reactive to light, neck supple Cardiovascular: Normal rate, regular rhythm and normal heart sounds.  No murmur heard. No BLE edema. Pulmonary/Chest: Effort normal and breath sounds normal. No respiratory distress. Abdominal: Soft.  There is no tenderness. Psychiatric: Patient has a normal mood and affect. behavior is normal. Judgment and thought content normal.   Recent Results (from the past 2160 hour(s))  CBC with Differential/Platelet     Status: None   Collection Time: 05/08/20  1:10 PM  Result Value Ref Range   WBC 5.5 3.4 - 10.8 x10E3/uL   RBC 4.55 3.77 - 5.28 x10E6/uL   Hemoglobin 14.5 11.1 - 15.9 g/dL   Hematocrit 42.5 34.0 - 46.6 %   MCV 93 79 - 97 fL   MCH 31.9 26.6 - 33.0 pg   MCHC 34.1 31.5 - 35.7 g/dL   RDW 13.1 11.7 - 15.4 %   Platelets 185 150 - 450 x10E3/uL   Neutrophils 55 Not Estab. %   Lymphs 36 Not Estab. %   Monocytes 7 Not Estab. %   Eos 1 Not Estab. %   Basos 1 Not Estab. %   Neutrophils Absolute 3.0 1.4 - 7.0 x10E3/uL   Lymphocytes Absolute 2.0 0.7 - 3.1 x10E3/uL   Monocytes Absolute 0.4 0.1 - 0.9 x10E3/uL   EOS (ABSOLUTE) 0.1 0.0 - 0.4 x10E3/uL   Basophils Absolute 0.0 0.0 - 0.2 x10E3/uL   Immature Granulocytes 0 Not Estab. %   Immature Grans (Abs) 0.0 0.0 - 0.1 x10E3/uL  Comprehensive metabolic panel     Status: Abnormal   Collection Time: 05/08/20  1:10 PM  Result Value Ref Range   Glucose 209 (H) 65 - 99 mg/dL   BUN 12 8 - 27 mg/dL   Creatinine, Ser 0.63 0.57 - 1.00 mg/dL   eGFR 98 >59 mL/min/1.73   BUN/Creatinine Ratio 19 12 - 28   Sodium 141 134 - 144 mmol/L   Potassium 4.3 3.5 - 5.2 mmol/L   Chloride 103 96 - 106 mmol/L   CO2 19 (L) 20 - 29 mmol/L  Calcium 9.6 8.7 - 10.3 mg/dL   Total Protein 7.5 6.0 - 8.5 g/dL   Albumin 4.6 3.8 - 4.8 g/dL   Globulin, Total 2.9 1.5 - 4.5 g/dL   Albumin/Globulin Ratio 1.6 1.2 - 2.2   Bilirubin Total 0.8 0.0 - 1.2 mg/dL   Alkaline Phosphatase 60 44 - 121 IU/L   AST 39 0 - 40 IU/L   ALT 31 0 - 32 IU/L   Microalbumin / creatinine urine ratio     Status: None   Collection Time: 05/08/20  1:10 PM  Result Value Ref Range   Creatinine, Urine 144.0 Not Estab. mg/dL   Microalbumin, Urine 24.9 Not Estab. ug/mL   Microalb/Creat Ratio 17 0 - 29 mg/g creat    Comment:                        Normal:                0 -  29                        Moderately increased: 30 - 300                        Severely increased:       >300   Lipid panel     Status: Abnormal   Collection Time: 05/08/20  1:10 PM  Result Value Ref Range   Cholesterol, Total 189 100 - 199 mg/dL   Triglycerides 368 (H) 0 - 149 mg/dL   HDL 31 (L) >39 mg/dL   VLDL Cholesterol Cal 62 (H) 5 - 40 mg/dL   LDL Chol Calc (NIH) 96 0 - 99 mg/dL   Chol/HDL Ratio 6.1 (H) 0.0 - 4.4 ratio    Comment:                                   T. Chol/HDL Ratio                                             Men  Women                               1/2 Avg.Risk  3.4    3.3                                   Avg.Risk  5.0    4.4                                2X Avg.Risk  9.6    7.1                                3X Avg.Risk 23.4   11.0   HgB A1c     Status: Abnormal   Collection Time: 05/08/20  1:10 PM  Result Value Ref Range   Hgb A1c MFr Bld 7.7 (H) 4.8 - 5.6 %    Comment:  Prediabetes: 5.7 - 6.4          Diabetes: >6.4          Glycemic control for adults with diabetes: <7.0    Est. average glucose Bld gHb Est-mCnc 174 mg/dL  POCT HgB A1C     Status: Abnormal   Collection Time: 07/26/20 12:09 PM  Result Value Ref Range   Hemoglobin A1C 6.8 (A) 4.0 - 5.6 %   HbA1c POC (<> result, manual entry)     HbA1c, POC (prediabetic range)     HbA1c, POC (controlled diabetic range)      PHQ2/9: Depression screen [facility] 2/9 07/26/2020 07/10/2020 05/21/2020 05/17/2020 05/08/2020  Decreased Interest _0 Down, Depressed, Hopeless _1 PHQ - 2 Score _2 Altered sleeping _3 - 2  Tired, decreased energy _4 - 3  Change in  appetite 1 0 3 - 3  Feeling bad or failure about yourself  _5 - 2  Trouble concentrating 0 0 0 - 0  Moving slowly or fidgety/restless 0 0 0 - 0  Suicidal thoughts 0 0 0 - 0  PHQ-9 Score _6 - 16  Difficult doing work/chores Very difficult Somewhat difficult - - -  Some recent data might be hidden    phq 9 is positive  Fall Risk: Fall Risk  07/26/2020 07/10/2020 05/17/2020 05/08/2020 01/09/2020  Falls in the past year? 1 1 0 0 1  Number falls in past yr: 0 0 0 0 0  Injury with Fall? 0 0 - 0 0  Risk for fall due to : History of fall(s) Impaired mobility Medication side effect;Other (Comment);Impaired mobility - -  Risk for fall due to: Comment - - Dizziness/Pending bilateral knee replacement - -  Follow up Falls prevention discussed Falls prevention discussed Falls prevention discussed - -      Functional Status Survey: Is the patient deaf or have difficulty hearing?: No Does the patient have difficulty seeing, even when wearing glasses/contacts?: No Does the patient have difficulty concentrating, remembering, or making decisions?: No Does the patient have difficulty walking or climbing stairs?: Yes Does the patient have difficulty dressing or bathing?: No Does the patient have difficulty doing errands alone such as visiting a doctor's office or shopping?: No    Assessment & Plan  1. Diabetes mellitus type 2 in obese (HCC)  - POCT HgB A1C  2. Major depression, recurrent, chronic (HCC)  - venlafaxine XR (EFFEXOR-XR) 150 MG 24 hr capsule; Take 1 capsule (150 mg total) by mouth daily with breakfast.  Dispense: 90 capsule; Refill: 1 - buPROPion (WELLBUTRIN XL) 150 MG 24 hr tablet; Take 1 tablet (150 mg total) by mouth daily with breakfast.  Dispense: 90 tablet; Refill: 0  3. Vitamin D deficiency  - Vitamin D, Ergocalciferol, (DRISDOL) 1.25 MG (50000 UNIT) CAPS capsule; Take 1 capsule (50,000 Units total) by mouth every 7 (seven) days.  Dispense: 12 capsule; Refill: 1  4.  Benign essential HTN  - losartan (COZAAR) 100 MG tablet; Take 1 tablet (100 mg total) by mouth daily.  Dispense: 90 tablet; Refill: 0  5. Mixed dyslipidemia  - atorvastatin (LIPITOR) 40 MG tablet; Take 1 tablet (40 mg total) by mouth at bedtime.  Dispense: 90 tablet; Refill: 1  6. Dyslipidemia associated with type 2 diabetes mellitus (HCC)  - atorvastatin (LIPITOR) 40 MG tablet; Take 1 tablet (40 mg total) by mouth  at bedtime.  Dispense: 90 tablet; Refill: 1  7. Controlled gout  - allopurinol (ZYLOPRIM) 100 MG tablet; Take 1 tablet (100 mg total) by mouth 2 (two) times daily.  Dispense: 180 tablet; Refill: 1  8. Mood disorder ([place])   9. Vitamin B12 deficiency   10. Primary osteoarthritis of both knees   11. Morbid obesity, unspecified obesity type [facility])  Discussed with the patient the risk posed by an increased BMI. Discussed importance of portion control, calorie counting and at least 150 minutes of physical activity weekly. Avoid sweet beverages and drink more water. Eat at least 6 servings of fruit and vegetables daily

## 2020-07-26 ENCOUNTER — Encounter: Payer: Self-pay | Admitting: Family Medicine

## 2020-07-26 ENCOUNTER — Ambulatory Visit (INDEPENDENT_AMBULATORY_CARE_PROVIDER_SITE_OTHER): Payer: PPO | Admitting: Family Medicine

## 2020-07-26 ENCOUNTER — Other Ambulatory Visit: Payer: Self-pay

## 2020-07-26 VITALS — BP 126/72 | HR 93 | Temp 98.1°F | Resp 20 | Ht 68.0 in | Wt 278.9 lb

## 2020-07-26 DIAGNOSIS — M109 Gout, unspecified: Secondary | ICD-10-CM | POA: Diagnosis not present

## 2020-07-26 DIAGNOSIS — M170 Bilateral primary osteoarthritis of knee: Secondary | ICD-10-CM

## 2020-07-26 DIAGNOSIS — E559 Vitamin D deficiency, unspecified: Secondary | ICD-10-CM | POA: Diagnosis not present

## 2020-07-26 DIAGNOSIS — F339 Major depressive disorder, recurrent, unspecified: Secondary | ICD-10-CM

## 2020-07-26 DIAGNOSIS — E785 Hyperlipidemia, unspecified: Secondary | ICD-10-CM

## 2020-07-26 DIAGNOSIS — E669 Obesity, unspecified: Secondary | ICD-10-CM

## 2020-07-26 DIAGNOSIS — E1169 Type 2 diabetes mellitus with other specified complication: Secondary | ICD-10-CM

## 2020-07-26 DIAGNOSIS — E538 Deficiency of other specified B group vitamins: Secondary | ICD-10-CM | POA: Diagnosis not present

## 2020-07-26 DIAGNOSIS — I10 Essential (primary) hypertension: Secondary | ICD-10-CM | POA: Diagnosis not present

## 2020-07-26 DIAGNOSIS — F39 Unspecified mood [affective] disorder: Secondary | ICD-10-CM

## 2020-07-26 DIAGNOSIS — E782 Mixed hyperlipidemia: Secondary | ICD-10-CM

## 2020-07-26 LAB — POCT GLYCOSYLATED HEMOGLOBIN (HGB A1C): Hemoglobin A1C: 6.8 % — AB (ref 4.0–5.6)

## 2020-07-26 MED ORDER — ATORVASTATIN CALCIUM 40 MG PO TABS: 40.0000 mg | ORAL_TABLET | Freq: Every day | ORAL | 1 refills | Status: DC

## 2020-07-26 MED ORDER — BUPROPION HCL ER (XL) 150 MG PO TB24: 150.0000 mg | ORAL_TABLET | Freq: Every day | ORAL | 0 refills | Status: DC

## 2020-07-26 MED ORDER — ALLOPURINOL 100 MG PO TABS: 100.0000 mg | ORAL_TABLET | Freq: Two times a day (BID) | ORAL | 1 refills | Status: DC

## 2020-07-26 MED ORDER — VITAMIN D (ERGOCALCIFEROL) 1.25 MG (50000 UNIT) PO CAPS: 50000.0000 [IU] | ORAL_CAPSULE | ORAL | 1 refills | Status: DC

## 2020-07-26 MED ORDER — VENLAFAXINE HCL ER 150 MG PO CP24: 150.0000 mg | ORAL_CAPSULE | Freq: Every day | ORAL | 1 refills | Status: DC

## 2020-07-26 MED ORDER — LOSARTAN POTASSIUM 100 MG PO TABS: 100.0000 mg | ORAL_TABLET | Freq: Every day | ORAL | 0 refills | Status: DC

## 2020-07-27 NOTE — Telephone Encounter (Signed)
Page with Tina Short is calling in to follow up on clearance form receipt.   Please assist further pt is schedule for surgery in July, she would like to have everything completed.     Phone: [phone] ext [phone] Fax: [phone]   Attn Page    Surgical

## 2020-07-27 NOTE — Telephone Encounter (Signed)
Looking into this, as forms were completed and faxed back. I did not see anything scanned.

## 2020-07-30 NOTE — Telephone Encounter (Signed)
Spoke with Page and she is going to fax over new form for completion.

## 2020-08-01 ENCOUNTER — Ambulatory Visit: Payer: Self-pay

## 2020-08-01 DIAGNOSIS — I152 Hypertension secondary to endocrine disorders: Secondary | ICD-10-CM

## 2020-08-01 DIAGNOSIS — E669 Obesity, unspecified: Secondary | ICD-10-CM

## 2020-08-01 DIAGNOSIS — E1169 Type 2 diabetes mellitus with other specified complication: Secondary | ICD-10-CM

## 2020-08-01 DIAGNOSIS — E1165 Type 2 diabetes mellitus with hyperglycemia: Secondary | ICD-10-CM | POA: Diagnosis not present

## 2020-08-01 DIAGNOSIS — E785 Hyperlipidemia, unspecified: Secondary | ICD-10-CM

## 2020-08-01 DIAGNOSIS — F339 Major depressive disorder, recurrent, unspecified: Secondary | ICD-10-CM | POA: Diagnosis not present

## 2020-08-01 DIAGNOSIS — E1159 Type 2 diabetes mellitus with other circulatory complications: Secondary | ICD-10-CM

## 2020-08-01 DIAGNOSIS — E1140 Type 2 diabetes mellitus with diabetic neuropathy, unspecified: Secondary | ICD-10-CM | POA: Diagnosis not present

## 2020-08-01 NOTE — Chronic Care Management (AMB) (Signed)
Chronic Care Management   Follow Up Note   08/01/2020 Name: Tina Short MRN: [id number] DOB: [birth year]  Primary Care Provider: Steele Sizer, MD Reason for referral : Chronic Care Management   Tina Short is a 65 y.o. year old female who is a primary care patient of Tina Sizer, MD. She is currently enrolled in the Chronic Care Management program. A telephonic outreach was conducted today.  Review of Tina Short's status, including review of consultants reports, relevant labs and test results was conducted today. Collaboration with appropriate care team members was performed as part of the comprehensive evaluation and provision of chronic care management services.     SDOH (Social Determinants of Health) assessments performed: No    Outpatient Encounter Medications as of 08/01/2020  Medication Sig Note   allopurinol (ZYLOPRIM) 100 MG tablet Take 1 tablet (100 mg total) by mouth 2 (two) times daily.    atorvastatin (LIPITOR) 40 MG tablet Take 1 tablet (40 mg total) by mouth at bedtime.    blood glucose meter kit and supplies Dispense based on patient and insurance preference. Use up to four times daily as directed. (FOR ICD-10 E10.9, E11.9).    buPROPion (WELLBUTRIN XL) 150 MG 24 hr tablet Take 1 tablet (150 mg total) by mouth daily with breakfast.    fenofibrate (TRICOR) 145 MG tablet TAKE 1 TABLET(145 MG) BY MOUTH DAILY    furosemide (LASIX) 40 MG tablet Daily 05/17/2020: Reports taking as needed.   glipiZIDE (GLUCOTROL XL) 5 MG 24 hr tablet TAKE 1 TABLET(5 MG) BY MOUTH DAILY WITH BREAKFAST    losartan (COZAAR) 100 MG tablet Take 1 tablet (100 mg total) by mouth daily.    melatonin 5 MG TABS Take 5 mg by mouth at bedtime.    metFORMIN (GLUCOPHAGE-XR) 750 MG 24 hr tablet TAKE 1 TABLET(750 MG) BY MOUTH TWICE DAILY 07/10/2020: Pt taking 2 tabs QAM   ONETOUCH ULTRA test strip TEST UP TO FOUR TIMES A DAY AS DIRECTED    potassium chloride SA (K-DUR) 20 MEQ tablet TAKE 1  TABLET(20 MEQ) BY MOUTH DAILY 05/17/2020: Reports taking only if taking Lasix   Semaglutide,0.25 or 0.5MG/DOS, (OZEMPIC, 0.25 OR 0.5 MG/DOSE,) 2 MG/1.5ML SOPN Inject 0.25 mg into the skin once a week. Through [employer] PAP    venlafaxine XR (EFFEXOR-XR) 150 MG 24 hr capsule Take 1 capsule (150 mg total) by mouth daily with breakfast.    vitamin B-12 (CYANOCOBALAMIN) 500 MCG tablet Take 500 mcg by mouth daily.    Vitamin D, Ergocalciferol, (DRISDOL) 1.25 MG (50000 UNIT) CAPS capsule Take 1 capsule (50,000 Units total) by mouth every 7 (seven) days.    No facility-administered encounter medications on file as of 08/01/2020.     Objective:  Patient Care Plan: Diabetes Type 2 (Adult)     Problem Identified: Glycemic Management (Diabetes, Type 2)      Long-Range Goal: Glycemic Management Optimized   Start Date: 05/17/2020  Expected End Date: 09/14/2020  Priority: High  Note:    Lab Results  Component Value Date   HGBA1C 6.8 (A) 07/26/2020      Current Barriers:  Chronic disease management support and educational needs related to Diabetes self-management   Case Manager Clinical Goal(s):  Over the next 120 days, patient will demonstrate improved adherence to prescribed treatment plan for Diabetes management as evidenced by taking medications as prescribed, monitoring and recording of CBG, and adherence to a ADA/ carb modified diet.   Interventions:  Collaboration with [employer],  Drue Stager, MD regarding development and update of comprehensive plan of care as evidenced by provider attestation and co-signature Inter-disciplinary care team collaboration (see longitudinal plan of care) Reviewed medications and compliance with diabetes self-management. Reports taking medications as prescribed. Confirmed receipt of Ozempic. Reviewed blood glucose readings Discussed s/sx of hypoglycemia and hyperglycemia. Reports levels have significantly improved with most readings ranging in the 130's. Reports  improvements with her nutritional intake and weight loss goals. Her A1C has improved. Decreased from 7.7% to 6.8%. Encouraged to continue monitoring and recording readings.  Discussed previous plans regarding knee surgery. The surgery was previously delayed d/t elevated A1C. She has been approved for surgery and anticipates completing the procedure in July.     Patient Goals/Self-Care Activities -Self-administer medications as prescribed -Attend all scheduled provider appointments -Follow provider's recommendations for optimal glycemic control -Adhere to prescribed ADA/carb modified -Notify provider or care management team with questions and new concerns as needed   Follow Up Plan:  Will follow up next month      Patient Care Plan: Medication Management Optimized   Problem Identified: Medication (Wellness)      Goal: Medication Adherence Maintained Completed 08/01/2020  Start Date: 07/02/2020  Expected End Date: 07/22/2020  Priority: High  Current Barriers:  Chronic disease management support related to medication management.   Case Manager Clinical Goal(s):  Over the next 30 days, patient will receive needed medication adjustments to achieve optimal medication management.   Interventions:  Collaboration with Tina Sizer, MD regarding development and update of comprehensive plan of care as evidenced by provider attestation and co-signature Inter-disciplinary care team collaboration (see longitudinal plan of care) Confirmed that she has adjusted her medications as prescribed. She previously expressed concern regarding the effectiveness of venlafaxine XR (Effexor) and requested to increase the dose or add another medication to her regimen. Reports continuing venlafaxine as prescribed and starting Wellbutrin 195m/day as advised. Agreed to update the care management team with concerns if needed.   Patient Goals/Self-Care Activities -Notify provider or care management team  with questions and new concerns as needed    Goal Met       PLAN A member of the care management team will follow up next month.    FCristy [facility]landerHealth/[facility]([phone]

## 2020-08-09 DIAGNOSIS — E785 Hyperlipidemia, unspecified: Secondary | ICD-10-CM | POA: Diagnosis not present

## 2020-08-09 DIAGNOSIS — E1159 Type 2 diabetes mellitus with other circulatory complications: Secondary | ICD-10-CM

## 2020-08-09 DIAGNOSIS — E669 Obesity, unspecified: Secondary | ICD-10-CM | POA: Diagnosis not present

## 2020-08-09 DIAGNOSIS — F339 Major depressive disorder, recurrent, unspecified: Secondary | ICD-10-CM | POA: Diagnosis not present

## 2020-08-09 DIAGNOSIS — I152 Hypertension secondary to endocrine disorders: Secondary | ICD-10-CM

## 2020-08-09 DIAGNOSIS — E1140 Type 2 diabetes mellitus with diabetic neuropathy, unspecified: Secondary | ICD-10-CM | POA: Diagnosis not present

## 2020-08-09 DIAGNOSIS — E1165 Type 2 diabetes mellitus with hyperglycemia: Secondary | ICD-10-CM

## 2020-08-09 DIAGNOSIS — E1169 Type 2 diabetes mellitus with other specified complication: Secondary | ICD-10-CM

## 2020-08-15 ENCOUNTER — Telehealth: Payer: Self-pay

## 2020-08-15 DIAGNOSIS — M1712 Unilateral primary osteoarthritis, left knee: Secondary | ICD-10-CM | POA: Diagnosis not present

## 2020-08-15 NOTE — Chronic Care Management (AMB) (Signed)
    Chronic Care Management Pharmacy Assistant   Name: SHAKEISHA HORINE  MRN: [id number] DOB: 1956-02-08  Date- Patient called to remind of appointment with [name], [name] on 08/16/2020 @ 1400.   No answer, left message of appointment date, time and type of appointment (either telephone or in person). Left message to have all medications, supplements, blood pressure and/or blood sugar logs available during appointment and to return call if need to reschedule.  Star Rating Drug: Atorvastatin 40 mg last filled 06/08/2020 for a 90-Day supply at [facility] Losartan 100 mg- last filled on 06/15/2020 for a 90-Day supply at [facility] Glipizide 5 mg 24 hr tablet last filled on 07/19/2020 for a 90-Day supply at [place] 0.25 mg patient on PAP for this medication as of 01/2021 Metformin XR 750 mg last filled on 06/21/2020 for a 90-Day supply at [facility]    Any gaps in medications fill history? No    Lynann Bologna, CPA/CMA Clinical Pharmacist Assistant Phone: [phone]

## 2020-08-16 ENCOUNTER — Telehealth: Payer: Self-pay

## 2020-08-16 NOTE — Progress Notes (Deleted)
Chronic Care Management Pharmacy Note  08/16/2020 Name:  Tina Short MRN:  [id number] DOB:  September 25, 1955  Summary: ***  Recommendations/Changes made from today's visit: ***  Plan: PCP follow-up in 2 weeks [name] follow-up in 4 weeks   Subjective: Tina Short is an 65 y.o. year old female who is a primary patient of Tina Sizer, MD.  The CCM team was consulted for assistance with disease management and care coordination needs.    Engaged with patient by telephone for follow up visit in response to provider referral for pharmacy case management and/or care coordination services.   Consent to Services:  The patient was given information about Chronic Care Management services, agreed to services, and gave verbal consent prior to initiation of services.  Please see initial visit note for detailed documentation.   Patient Care Team: Tina Sizer, MD as PCP - General (Family Medicine) Tina Sizer, MD as Attending Physician (Family Medicine) Neldon Labella, RN as Case Manager Germaine Pomfret, [facility] (Pharmacist) Lovell Sheehan, MD as Consulting Physician (Orthopedic Surgery)  Recent office visits: 07/26/20: Patient presented to Dr. Ancil Boozer for follow-up. A1c improved to 6.8%. PHQ9 with Wellbutrin, but worsening sleep.  05/08/20: Patient presented to Dr. Ancil Boozer for follow-up. A1c improved to 7.7%.Patient started on divalproex 250 mg daily.   Recent consult visits: None in previous 6 months  Hospital visits: None in previous 6 months  Objective:  Lab Results  Component Value Date   CREATININE 0.63 05/08/2020   BUN 12 05/08/2020   GFRNONAA >60 04/25/2019   GFRAA >60 04/25/2019   NA 141 05/08/2020   K 4.3 05/08/2020   CALCIUM 9.6 05/08/2020   CO2 19 (L) 05/08/2020   GLUCOSE 209 (H) 05/08/2020    Lab Results  Component Value Date/Time   HGBA1C 6.8 (A) 07/26/2020 12:09 PM   HGBA1C 7.7 (H) 05/08/2020 01:10 PM   HGBA1C 8.0 (A) 01/09/2020 12:06 PM   HGBA1C  8.5 (H) 03/18/2019 10:45 AM   HGBA1C 6.7 01/29/2018 11:42 AM   HGBA1C 6.7 (A) 01/29/2018 11:42 AM   HGBA1C 6.7 01/29/2018 11:42 AM   FRUCTOSAMINE 266 09/12/2014 09:12 AM   MICROALBUR 50 09/12/2014 08:14 AM    Last diabetic Eye exam:  Lab Results  Component Value Date/Time   HMDIABEYEEXA No Retinopathy 10/08/2017 12:00 AM    Last diabetic Foot exam: No results found for: HMDIABFOOTEX   Lab Results  Component Value Date   CHOL 189 05/08/2020   HDL 31 (L) 05/08/2020   LDLCALC 96 05/08/2020   TRIG 368 (H) 05/08/2020   CHOLHDL 6.1 (H) 05/08/2020    Hepatic Function Latest Ref Rng & Units 05/08/2020 04/22/2019 04/21/2019  Total Protein 6.0 - 8.5 g/dL 7.5 7.6 8.3(H)  Albumin 3.8 - 4.8 g/dL 4.6 3.5 3.7  AST 0 - 40 IU/L 39 37 38  ALT 0 - 32 IU/L 31 29 32  Alk Phosphatase 44 - 121 IU/L 60 55 55  Total Bilirubin 0.0 - 1.2 mg/dL 0.8 1.1 1.5(H)    Lab Results  Component Value Date/Time   TSH 1.380 07/26/2015 11:19 AM   TSH 0.95 05/26/2014 12:00 AM    CBC Latest Ref Rng & Units 05/08/2020 04/25/2019 04/24/2019  WBC 3.4 - 10.8 x10E3/uL 5.5 4.8 4.3  Hemoglobin 11.1 - 15.9 g/dL 14.5 12.2 13.1  Hematocrit 34.0 - 46.6 % 42.5 34.9(L) 38.2  Platelets 150 - 450 x10E3/uL 185 144(L) 146(L)    Lab Results  Component Value Date/Time   VD25OH 20.4 (L)  03/18/2019 10:45 AM   VD25OH 23.9 (L) 10/26/2018 11:23 AM    Clinical ASCVD: No  The 10-year ASCVD risk score Mikey Bussing DC Jr., et al., 2013) is: 17.9%   Values used to calculate the score:     Age: 63 years     Sex: Female     Is Non-Hispanic African American: No     Diabetic: Yes     Tobacco smoker: No     Systolic Blood Pressure: 829 mmHg     Is BP treated: Yes     HDL Cholesterol: 31 mg/dL     Total Cholesterol: 189 mg/dL    Depression screen [facility] 2/9 07/26/2020 07/10/2020 05/21/2020  Decreased Interest '1 3 1  ' Down, Depressed, Hopeless '1 3 1  ' PHQ - 2 Score '2 6 2  ' Altered sleeping '3 3 2  ' Tired, decreased energy '2 3 2  ' Change in appetite  1 0 3  Feeling bad or failure about yourself  '1 2 2  ' Trouble concentrating 0 0 0  Moving slowly or fidgety/restless 0 0 0  Suicidal thoughts 0 0 0  PHQ-9 Score '9 14 11  ' Difficult doing work/chores Very difficult Somewhat difficult -  Some recent data might be hidden     Social History   Tobacco Use  Smoking Status Never  Smokeless Tobacco Never   BP Readings from Last 3 Encounters:  07/26/20 126/72  05/08/20 132/74  01/09/20 118/76   Pulse Readings from Last 3 Encounters:  07/26/20 93  05/08/20 95  01/09/20 96   Wt Readings from Last 3 Encounters:  07/26/20 278 lb 14.4 oz (126.5 kg)  05/08/20 284 lb (128.8 kg)  01/09/20 276 lb 11.2 oz (125.5 kg)   BMI Readings from Last 3 Encounters:  07/26/20 42.41 kg/m  05/08/20 43.18 kg/m  01/09/20 42.07 kg/m    Assessment/Interventions: Review of patient past medical history, allergies, medications, health status, including review of consultants reports, laboratory and other test data, was performed as part of comprehensive evaluation and provision of chronic care management services.   SDOH:  (Social Determinants of Health) assessments and interventions performed: Yes  SDOH Screenings   Alcohol Screen: Low Risk    Last Alcohol Screening Score (AUDIT): 0  Depression (PHQ2-9): Medium Risk   PHQ-2 Score: 9  Financial Resource Strain: Low Risk    Difficulty of Paying Living Expenses: Not very hard  Food Insecurity: No Food Insecurity   Worried About Charity fundraiser in the Last Year: Never true   Ran Out of Food in the Last Year: Never true  Housing: Low Risk    Last Housing Risk Score: 0  Physical Activity: Inactive   Days of Exercise per Week: 0 days   Minutes of Exercise per Session: 0 min  Social Connections: Moderately Integrated   Frequency of Communication with Friends and Family: More than three times a week   Frequency of Social Gatherings with Friends and Family: More than three times a week   Attends  Religious Services: More than 4 times per year   Active Member of [employer] or Organizations: No   Attends Archivist Meetings: Never   Marital Status: Married  Stress: Stress Concern Present   Feeling of Stress : To some extent  Tobacco Use: Low Risk    Smoking Tobacco Use: Never   Smokeless Tobacco Use: Never  Transportation Needs: No Transportation Needs   Lack of Transportation (Medical): No   Lack of Transportation (Non-Medical): No  CCM Care Plan  Allergies  Allergen Reactions   Abilify [Aripiprazole] Shortness Of Breath    Other reaction(s): Difficulty breathing   Invokana [Canagliflozin] Itching    Yeast infections   Metformin And Related     Diarrhea, but doing well with XR formulation     Lovaza [Omega-3-Acid Ethyl Esters] Other (See Comments)    Burping     Medications Reviewed Today     Reviewed by Neldon Labella, RN (Registered Nurse) on 08/01/20 at 1445  Med List Status: <None>   Medication Order Taking? Sig Documenting Provider Last Dose Status Informant  allopurinol (ZYLOPRIM) 100 MG tablet [id number]  Take 1 tablet (100 mg total) by mouth 2 (two) times daily. Tina Sizer, MD  Active   atorvastatin (LIPITOR) 40 MG tablet [id number]  Take 1 tablet (40 mg total) by mouth at bedtime. Tina Sizer, MD  Active   blood glucose meter kit and supplies [id number] No Dispense based on patient and insurance preference. Use up to four times daily as directed. (FOR ICD-10 E10.9, E11.9). Tina Sizer, MD Taking Active Self  buPROPion (WELLBUTRIN XL) 150 MG 24 hr tablet [id number]  Take 1 tablet (150 mg total) by mouth daily with breakfast. Tina Sizer, MD  Active   fenofibrate (TRICOR) 145 MG tablet [id number] No TAKE 1 TABLET(145 MG) BY MOUTH DAILY Tina Sizer, MD Taking Active   furosemide (LASIX) 40 MG tablet [id number] No Daily Tina Sizer, MD Taking Active            Med Note Minerva Ends, [facility]   Thu May 17, 2020  2:37 PM) Reports taking as  needed.  glipiZIDE (GLUCOTROL XL) 5 MG 24 hr tablet [id number] No TAKE 1 TABLET(5 MG) BY MOUTH DAILY WITH [place], Drue Stager, MD Taking Active   losartan (COZAAR) 100 MG tablet [id number]  Take 1 tablet (100 mg total) by mouth daily. Tina Sizer, MD  Active   melatonin 5 MG TABS [id number] No Take 5 mg by mouth at bedtime. [provider] Taking Active   metFORMIN (GLUCOPHAGE-XR) 750 MG 24 hr tablet [id number] No TAKE 1 TABLET(750 MG) BY MOUTH TWICE DAILY Tina Sizer, MD Taking Active            Med Note Alleen Borne   Tue Jul 10, 2020  3:06 PM) Pt taking 2 tabs QAM  ONETOUCH ULTRA test strip [id number] No TEST UP TO FOUR TIMES A DAY AS DIRECTED Tina Sizer, MD Taking Active   potassium chloride SA (K-DUR) 20 MEQ tablet [id number] No TAKE 1 TABLET(20 MEQ) BY MOUTH DAILY Tina Sizer, MD Taking Active            Med Note [facility], [facility] N   Thu May 17, 2020  2:37 PM) Reports taking only if taking Lasix  Semaglutide,0.25 or 0.5MG/DOS, (OZEMPIC, 0.25 OR 0.5 MG/DOSE,) 2 MG/1.5ML SOPN [id number] No Inject 0.25 mg into the skin once a week. Through [employer] PAP [place], Drue Stager, MD Taking Active   venlafaxine XR (EFFEXOR-XR) 150 MG 24 hr capsule [id number]  Take 1 capsule (150 mg total) by mouth daily with breakfast. Tina Sizer, MD  Active   vitamin B-12 (CYANOCOBALAMIN) 500 MCG tablet [id number] No Take 500 mcg by mouth daily. [provider] Taking Active   Vitamin D, Ergocalciferol, (DRISDOL) 1.25 MG (50000 UNIT) CAPS capsule [id number]  Take 1 capsule (50,000 Units total) by mouth every 7 (seven) days. Tina Sizer, MD  Active  Patient Active Problem List   Diagnosis Date Noted   Esophageal dysphagia    Colon cancer screening 04/30/2016   Major depression, recurrent, chronic ([place]) 10/30/2015   Dyslipidemia associated with type 2 diabetes mellitus ([place]) 10/30/2015   Vitamin D deficiency 07/27/2015   Vitamin B12 deficiency 07/27/2015    History of anemia 09/12/2014   Carpal tunnel syndrome 09/12/2014   Obstructive sleep apnea 09/12/2014   Edema of extremities 09/12/2014   Gout 09/12/2014   Morbid obesity, unspecified obesity type ([place]) 11/15/2009   Benign essential HTN 10/07/2006   Type 2 diabetes mellitus, uncontrolled, with neuropathy ([place]) 10/07/2006   Hyperlipidemia 10/07/2006    Immunization History  Administered Date(s) Administered   Influenza Nasal 10/28/2018   Influenza,inj,Quad PF,6+ Mos 11/27/2016, 01/29/2018, 01/09/2020   Pneumococcal Conjugate-13 05/02/2013   Pneumococcal Polysaccharide-23 12/27/2014, 10/28/2018   Tdap 11/15/2007, 01/29/2018   Zoster, Live 01/23/2016    Conditions to be addressed/monitored:  Hypertension, Hyperlipidemia, Diabetes, GERD, Depression, Osteoarthritis and Gout  There are no care plans that you recently modified to display for this patient.    Medication Assistance:  Ozempic obtained through [employer] medication assistance program.  Enrollment ends Dec 2022  Compliance/Adherence/Medication fill history: Care Gaps: Shingrix Ophthalmology exam  Star-Rating Drugs: Atorvastatin: LF 06/08/20 for 90-DS Glipizide: LF 06/15/20 for 90-DS Losartan: LF 06/15/20 for 90-DS Metformin XR: 06/21/20 for 90-DS   Patient's preferred pharmacy is:  [facility] [place], [address] [address] Phone: [phone] Fax: [phone]  Uses pill box? Yes Pt endorses 100% compliance  We discussed: Current pharmacy is preferred with insurance plan and patient is satisfied with pharmacy services Patient decided to: Continue current medication management strategy  Care Plan and Follow Up Patient Decision:  Patient agrees to Care Plan and Follow-up.  Plan: Telephone follow up appointment with care management team member scheduled for:  08/16/2020 at 2:00 PM and Next PCP  appointment scheduled for:  07/26/2020 at 11:40 AM  Doristine Section, [facility] [phone]  Current Barriers:  Suboptimal therapeutic regimen for Diabetes  Suboptimal therapeutic regimen for Cholesterol  Pharmacist Clinical Goal(s):  Patient will achieve control of cholesterol as evidenced by LDL less than 100, TG less than 150 maintain control of diabetes as evidenced by A1c less than 8%  through collaboration with PharmD and provider.   Interventions: 1:1 collaboration with Tina Sizer, MD regarding development and update of comprehensive plan of care as evidenced by provider attestation and co-signature Inter-disciplinary care team collaboration (see longitudinal plan of care) Comprehensive medication review performed; medication list updated in electronic medical record  Hypertension (BP goal <140/90) -Controlled -Current treatment: Losartan 100 mg daily  Furosemide 40 mg daily as needed -Medications previously tried: NA  -Current home readings: NA -Denies hypotensive/hypertensive symptoms -Educated on Exercise goal of 150 minutes per week; Importance of home blood pressure monitoring; Proper BP monitoring technique; -Counseled to monitor BP at home weekly, document, and provide log at future appointments -Recommended to continue current medication  Hyperlipidemia: (LDL goal < 100) -Not ideally controlled -Current treatment: Atorvastatin 40 mg at bedtime  Fenofibrate 145 mg daily  -Medications previously tried: Lovaza  -Educated on Importance of limiting foods high in cholesterol; -Recommended to continue current medication  Diabetes (A1c goal <7%) -Diagnosed: 1995  -Controlled -Current medications: Glipizide XL 5 mg daily  Metformin XR 750 mg two tablets daily  Ozempic 0.25  mg weekly  -Medications previously tried: Trulicity, Glimepiride, Ozempic (Cost), Invokana, .  -Current home glucose readings fasting  glucose: 130 post prandial glucose: NA -Reports  hyperglycemic symptoms: polyphagia, fatigue -Patient reported she was approved for Ozempic through December 2022, but has not started the medication yet as she was unsure what dose to take and whether to continue glipizide.  -Recommend starting Ozempic 0.25 mg weekly for four weeks to ensure tolerability. Will plan to stop glipizide in one month once patient reaches therapeutic dose of Ozempic.   Depression/Insomnia (Goal: Maintain mood, improve sleep quality) -Uncontrolled -Current treatment: Melatonin 5 mg nightly  Venlafaxine XR 150 mg daily  Wellbutrin XL 150 mg daily  -Medications previously tried/failed: Fluoxetine, Abilify (shortness of breath), Depakote (ineffective) -PHQ9: 9 -GAD7: NA  PHQ9 SCORE ONLY 07/26/2020 07/10/2020 05/21/2020  PHQ-9 Total Score '9 14 11     ' Gout (Goal: prevent gout flares) -Controlled -Current treatment  Allopurinol 100 mg twice daily  -Medications previously tried: NA  -Last gout flare: > 5 years  -Recommended to continue current medication  Osteoarthritis (Goal: Minimize pain and improve quality of life ) -Controlled -Current treatment  Ibuprofen 200-400 mg q6hr PRN moderate or severe arthritis  -Medications previously tried: NA  -Counseled on minimizing NSAID use, risks of chronic NSAID use -Recommended to continue current medication  Patient Goals/Self-Care Activities Patient will:  - check glucose daily, document, and provide at future appointments check blood pressure weekly, document, and provide at future appointments  Follow Up Plan: Telephone follow up appointment with care management team member scheduled for:  08/16/2020 at 2:00 PM

## 2020-08-17 NOTE — Patient Instructions (Signed)
Thank you for allowing the Chronic Care Management team to participate in your care. It was a pleasure speaking with you. Please feel free to contact me with questions.   Goals Addressed: Patient Care Plan: Diabetes Type 2 (Adult)     Problem Identified: Glycemic Management (Diabetes, Type 2)      Long-Range Goal: Glycemic Management Optimized   Start Date: 05/17/2020  Expected End Date: 09/14/2020  Priority: High  Note:    Lab Results  Component Value Date   HGBA1C 6.8 (A) 07/26/2020      Current Barriers:  Chronic disease management support and educational needs related to Diabetes self-management   Case Manager Clinical Goal(s):  Over the next 120 days, patient will demonstrate improved adherence to prescribed treatment plan for Diabetes management as evidenced by taking medications as prescribed, monitoring and recording of CBG, and adherence to a ADA/ carb modified diet.   Interventions:  Collaboration with Steele Sizer, MD regarding development and update of comprehensive plan of care as evidenced by provider attestation and co-signature Inter-disciplinary care team collaboration (see longitudinal plan of care) Reviewed medications and compliance with diabetes self-management. Reports taking medications as prescribed. Confirmed receipt of Ozempic. Reviewed blood glucose readings Discussed s/sx of hypoglycemia and hyperglycemia. Reports levels have significantly improved with most readings ranging in the 130's. Reports improvements with her nutritional intake and weight loss goals. Her A1C has improved. Decreased from 7.7% to 6.8%. Encouraged to continue monitoring and recording readings.  Discussed previous plans regarding knee surgery. The surgery was previously delayed d/t elevated A1C. She has been approved for surgery and anticipates completing the procedure in July.     Patient Goals/Self-Care Activities -Self-administer medications as prescribed -Attend all  scheduled provider appointments -Follow provider's recommendations for optimal glycemic control -Adhere to prescribed ADA/carb modified -Notify provider or care management team with questions and new concerns as needed   Follow Up Plan:  Will follow up next month      Patient Care Plan: Medication Management Optimized   Problem Identified: Medication (Wellness)      Goal: Medication Adherence Maintained Completed 08/01/2020  Start Date: 07/02/2020  Expected End Date: 07/22/2020  Priority: High  Current Barriers:  Chronic disease management support related to medication management.   Case Manager Clinical Goal(s):  Over the next 30 days, patient will receive needed medication adjustments to achieve optimal medication management.   Interventions:  Collaboration with Steele Sizer, MD regarding development and update of comprehensive plan of care as evidenced by provider attestation and co-signature Inter-disciplinary care team collaboration (see longitudinal plan of care) Confirmed that she has adjusted her medications as prescribed. She previously expressed concern regarding the effectiveness of venlafaxine XR (Effexor) and requested to increase the dose or add another medication to her regimen. Reports continuing venlafaxine as prescribed and starting Wellbutrin 17m/day as advised. Agreed to update the care management team with concerns if needed.   Patient Goals/Self-Care Activities -Notify provider or care management team with questions and new concerns as needed    Goal Met         Mrs. Tomich verbalized understanding of the information discussed during the telephonic outreach. Declined need for mailed/printed instructions.  A member of the care management team will follow up next month.    [facility] Care Management [facility]([phone]

## 2020-08-28 DIAGNOSIS — B372 Candidiasis of skin and nail: Secondary | ICD-10-CM | POA: Insufficient documentation

## 2020-08-29 ENCOUNTER — Ambulatory Visit: Payer: Self-pay

## 2020-08-29 DIAGNOSIS — E1169 Type 2 diabetes mellitus with other specified complication: Secondary | ICD-10-CM

## 2020-08-29 DIAGNOSIS — E669 Obesity, unspecified: Secondary | ICD-10-CM

## 2020-08-29 NOTE — Chronic Care Management (AMB) (Addendum)
Chronic Care Management   CCM RN Visit Note  08/29/2020 Name: Tina Short MRN: [id number] DOB: 1955/08/21  Subjective: Tina Short is a 65 y.o. year old female who is a primary care patient of Steele Sizer, MD. The care management team was consulted for assistance with disease management and care coordination needs.    Engaged with patient by telephone for follow up visit in response to provider referral for case management and/or care coordination services.   Consent to Services:  Patient requested to discontinue outreach due to her insurance provider not covering the cost of care management services.  Assessment: Review of patient past medical history, allergies, medications, health status, including review of consultants reports, laboratory and other test data, was performed as part of comprehensive evaluation and provision of chronic care management services.   SDOH (Social Determinants of Health) assessments and interventions performed:    CCM Care Plan  Allergies  Allergen Reactions   Abilify [Aripiprazole] Shortness Of Breath    Other reaction(s): Difficulty breathing   Invokana [Canagliflozin] Itching    Yeast infections   Metformin And Related     Diarrhea, but doing well with XR formulation     Lovaza [Omega-3-Acid Ethyl Esters] Other (See Comments)    Burping     Outpatient Encounter Medications as of 08/29/2020  Medication Sig Note   allopurinol (ZYLOPRIM) 100 MG tablet Take 1 tablet (100 mg total) by mouth 2 (two) times daily.    atorvastatin (LIPITOR) 40 MG tablet Take 1 tablet (40 mg total) by mouth at bedtime.    blood glucose meter kit and supplies Dispense based on patient and insurance preference. Use up to four times daily as directed. (FOR ICD-10 E10.9, E11.9).    buPROPion (WELLBUTRIN XL) 150 MG 24 hr tablet Take 1 tablet (150 mg total) by mouth daily with breakfast.    fenofibrate (TRICOR) 145 MG tablet TAKE 1 TABLET(145 MG) BY MOUTH DAILY     furosemide (LASIX) 40 MG tablet Daily 05/17/2020: Reports taking as needed.   glipiZIDE (GLUCOTROL XL) 5 MG 24 hr tablet TAKE 1 TABLET(5 MG) BY MOUTH DAILY WITH BREAKFAST    losartan (COZAAR) 100 MG tablet Take 1 tablet (100 mg total) by mouth daily.    melatonin 5 MG TABS Take 5 mg by mouth at bedtime.    metFORMIN (GLUCOPHAGE-XR) 750 MG 24 hr tablet TAKE 1 TABLET(750 MG) BY MOUTH TWICE DAILY 07/10/2020: Pt taking 2 tabs QAM   ONETOUCH ULTRA test strip TEST UP TO FOUR TIMES A DAY AS DIRECTED    potassium chloride SA (K-DUR) 20 MEQ tablet TAKE 1 TABLET(20 MEQ) BY MOUTH DAILY 05/17/2020: Reports taking only if taking Lasix   Semaglutide,0.25 or 0.5MG/DOS, (OZEMPIC, 0.25 OR 0.5 MG/DOSE,) 2 MG/1.5ML SOPN Inject 0.25 mg into the skin once a week. Through [employer] PAP    venlafaxine XR (EFFEXOR-XR) 150 MG 24 hr capsule Take 1 capsule (150 mg total) by mouth daily with breakfast.    vitamin B-12 (CYANOCOBALAMIN) 500 MCG tablet Take 500 mcg by mouth daily.    Vitamin D, Ergocalciferol, (DRISDOL) 1.25 MG (50000 UNIT) CAPS capsule Take 1 capsule (50,000 Units total) by mouth every 7 (seven) days.    No facility-administered encounter medications on file as of 08/29/2020.    Patient Active Problem List   Diagnosis Date Noted   Esophageal dysphagia    Colon cancer screening 04/30/2016   Major depression, recurrent, chronic ([place]) 10/30/2015   Dyslipidemia associated with type 2 diabetes  mellitus ([place]) 10/30/2015   Vitamin D deficiency 07/27/2015   Vitamin B12 deficiency 07/27/2015   History of anemia 09/12/2014   Carpal tunnel syndrome 09/12/2014   Obstructive sleep apnea 09/12/2014   Edema of extremities 09/12/2014   Gout 09/12/2014   Morbid obesity, unspecified obesity type ([place]) 11/15/2009   Benign essential HTN 10/07/2006   Type 2 diabetes mellitus, uncontrolled, with neuropathy ([place]) 10/07/2006   Hyperlipidemia 10/07/2006     PLAN No further follow up required.      [name] Care Management [facility] [phone]

## 2020-09-07 NOTE — Progress Notes (Signed)
Name: Tina Short   MRN: [id number]    DOB: 11/14/1955   Date:09/10/2020       Progress Note  Subjective  Chief Complaint  Follow Up  HPI  She has a knee replacement surgery scheduled for August 4 th - Dr. Harlow Mares, the surgery has been postponed twice due to left third toe cellulitis that progressed to left lower leg cellulitis. She is worried about going back. She has a sore on the bottom of left 3rd toe. She has diabetes and last A1C was at goal at 6.8 %, there is no pain, she took two rounds of Septra and Keflex and currently redness on lower leg is not very significant but left toe is getting red again.   Patient Active Problem List   Diagnosis Date Noted  . Candidiasis of skin 08/28/2020  . Esophageal dysphagia   . Colon cancer screening 04/30/2016  . Major depression, recurrent, chronic ([place]) 10/30/2015  . Dyslipidemia associated with type 2 diabetes mellitus ([place]) 10/30/2015  . Vitamin D deficiency 07/27/2015  . Vitamin B12 deficiency 07/27/2015  . History of anemia 09/12/2014  . Carpal tunnel syndrome 09/12/2014  . Obstructive sleep apnea 09/12/2014  . Edema of extremities 09/12/2014  . Gout 09/12/2014  . Morbid obesity, unspecified obesity type ([place]) 11/15/2009  . Benign essential HTN 10/07/2006  . Type 2 diabetes mellitus, uncontrolled, with neuropathy ([place]) 10/07/2006  . Hyperlipidemia 10/07/2006    Past Surgical History:  Procedure Laterality Date  . ABDOMINAL HYSTERECTOMY    . COLONOSCOPY    . COLONOSCOPY WITH PROPOFOL N/A 05/14/2016   Procedure: COLONOSCOPY WITH PROPOFOL;  Surgeon: [name], MD;  Location: [facility] ENDOSCOPY;  Service: Endoscopy;  Laterality: N/A;  . COLONOSCOPY WITH PROPOFOL N/A 04/15/2018   Procedure: COLONOSCOPY WITH PROPOFOL;  Surgeon: Lin Landsman, MD;  Location[facility] ENDOSCOPY;  Service: Gastroenterology;  Laterality: N/A;  . ESOPHAGOGASTRODUODENOSCOPY (EGD) WITH PROPOFOL N/A 04/15/2018   Procedure: ESOPHAGOGASTRODUODENOSCOPY (EGD)  WITH PROPOFOL;  Surgeon: Lin Landsman, MD;  Location: [facility] ENDOSCOPY;  Service: Gastroenterology;  Laterality: N/A;  . FOOT SURGERY Right    X2  . SHOULDER SURGERY Right   . WRIST SURGERY Bilateral     Family History  Problem Relation Age of Onset  . Diabetes Mother   . Cancer Mother        uterine  . Mental illness Father   . Obesity Daughter   . Diabetes Maternal Grandmother   . Heart disease Brother   . Obesity Daughter     Social History   Tobacco Use  . Smoking status: Never  . Smokeless tobacco: Never  Substance Use Topics  . Alcohol use: No    Alcohol/week: 0.0 standard drinks     Current Outpatient Medications:  .  allopurinol (ZYLOPRIM) 100 MG tablet, Take 1 tablet (100 mg total) by mouth 2 (two) times daily., Disp: 180 tablet, Rfl: 1 .  atorvastatin (LIPITOR) 40 MG tablet, Take 1 tablet (40 mg total) by mouth at bedtime., Disp: 90 tablet, Rfl: 1 .  blood glucose meter kit and supplies, Dispense based on patient and insurance preference. Use up to four times daily as directed. (FOR ICD-10 E10.9, E11.9)., Disp: 1 each, Rfl: 0 .  buPROPion (WELLBUTRIN XL) 150 MG 24 hr tablet, Take 1 tablet (150 mg total) by mouth daily with breakfast., Disp: 90 tablet, Rfl: 0 .  fenofibrate (TRICOR) 145 MG tablet, TAKE 1 TABLET(145 MG) BY MOUTH DAILY, Disp: 90 tablet, Rfl: 1 .  furosemide (  LASIX) 40 MG tablet, Daily, Disp: 90 tablet, Rfl: 1 .  glipiZIDE (GLUCOTROL XL) 5 MG 24 hr tablet, TAKE 1 TABLET(5 MG) BY MOUTH DAILY WITH BREAKFAST, Disp: 90 tablet, Rfl: 1 .  losartan (COZAAR) 100 MG tablet, Take 1 tablet (100 mg total) by mouth daily., Disp: 90 tablet, Rfl: 0 .  metFORMIN (GLUCOPHAGE-XR) 750 MG 24 hr tablet, TAKE 1 TABLET(750 MG) BY MOUTH TWICE DAILY, Disp: 180 tablet, Rfl: 1 .  ONETOUCH ULTRA test strip, TEST UP TO FOUR TIMES A DAY AS DIRECTED, Disp: 300 strip, Rfl: 0 .  potassium chloride SA (K-DUR) 20 MEQ tablet, TAKE 1 TABLET(20 MEQ) BY MOUTH DAILY, Disp: 90 tablet,  Rfl: 0 .  Semaglutide,0.25 or 0.5MG/DOS, (OZEMPIC, 0.25 OR 0.5 MG/DOSE,) 2 MG/1.5ML SOPN, Inject 0.25 mg into the skin once a week. Through [employer] PAP, Disp: 1.5 mL, Rfl:  .  venlafaxine XR (EFFEXOR-XR) 150 MG 24 hr capsule, Take 1 capsule (150 mg total) by mouth daily with breakfast., Disp: 90 capsule, Rfl: 1 .  vitamin B-12 (CYANOCOBALAMIN) 500 MCG tablet, Take 500 mcg by mouth daily., Disp: , Rfl:  .  Vitamin D, Ergocalciferol, (DRISDOL) 1.25 MG (50000 UNIT) CAPS capsule, Take 1 capsule (50,000 Units total) by mouth every 7 (seven) days., Disp: 12 capsule, Rfl: 1  Allergies  Allergen Reactions  . Abilify [Aripiprazole] Shortness Of Breath    Other reaction(s): Difficulty breathing  . Invokana [Canagliflozin] Itching    Yeast infections  . Metformin And Related     Diarrhea, but doing well with XR formulation    . Lovaza [Omega-3-Acid Ethyl Esters] Other (See Comments)    Burping     I personally reviewed active problem list, medication list, allergies, family history, social history, health maintenance with the patient/caregiver today.   ROS  Constitutional: Negative for fever or weight change.  Respiratory: Negative for cough and shortness of breath.   Cardiovascular: Negative for chest pain or palpitations.  Gastrointestinal: Negative for abdominal pain, no bowel changes.  Musculoskeletal: Negative for gait problem or joint swelling.  Skin: Negative for rash.  Neurological: Negative for dizziness or headache.  No other specific complaints in a complete review of systems (except as listed in HPI above).   Objective  Vitals:   09/10/20 1149  BP: 130/86  Pulse: 89  Resp: 16  Temp: 97.9 F (36.6 C)  SpO2: 95%  Weight: 271 lb (122.9 kg)  Height: 5' 8" (1.727 m)    Body mass index is 41.21 kg/m.  Physical Exam  Constitutional: Patient appears well-developed and well-nourished. Obese  No distress.  HEENT: head atraumatic, normocephalic, pupils equal and  reactive to light, neck supple Cardiovascular: Normal rate, regular rhythm and normal heart sounds.  No murmur heard. Traced  BLE edema. Pulmonary/Chest: Effort normal and breath sounds normal. No respiratory distress. Abdominal: Soft.  There is no tenderness. Skin: left third toe erythematous, bottom of toe has a scab/ulceration, no tenderness, mild erythema of left lower extremity but no increase in warmth or redness, pictures attached  Psychiatric: Patient has a normal mood and affect. behavior is normal. Judgment and thought content normal.   Recent Results (from the past 2160 hour(s))  POCT HgB A1C     Status: Abnormal   Collection Time: 07/26/20 12:09 PM  Result Value Ref Range   Hemoglobin A1C 6.8 (A) 4.0 - 5.6 %   HbA1c POC (<> result, manual entry)     HbA1c, POC (prediabetic range)     HbA1c, POC (  controlled diabetic range)       PHQ2/9: Depression screen [facility] 2/9 09/10/2020 07/26/2020 07/10/2020 05/21/2020 05/17/2020  Decreased Interest _0 Down, Depressed, Hopeless _1 PHQ - 2 Score _2 Altered sleeping _3 -  Tired, decreased energy _4 -  Change in appetite 0 1 0 3 -  Feeling bad or failure about yourself  0 _5 -  Trouble concentrating 0 0 0 0 -  Moving slowly or fidgety/restless 0 0 0 0 -  Suicidal thoughts 0 0 0 0 -  PHQ-9 Score _6 -  Difficult doing work/chores - Very difficult Somewhat difficult - -  Some recent data might be hidden    phq 9 is positive   Fall Risk: Fall Risk  09/10/2020 07/26/2020 07/10/2020 05/17/2020 05/08/2020  Falls in the past year? 0 1 1 0 0  Number falls in past yr: 0 0 0 0 0  Injury with Fall? 0 0 0 - 0  Risk for fall due to : - History of fall(s) Impaired mobility Medication side effect;Other (Comment);Impaired mobility -  Risk for fall due to: Comment - - - Dizziness/Pending bilateral knee replacement -  Follow up - Falls prevention discussed Falls prevention discussed Falls prevention discussed -       Functional Status Survey: Is the patient deaf or have difficulty hearing?: No Does the patient have difficulty seeing, even when wearing glasses/contacts?: No Does the patient have difficulty concentrating, remembering, or making decisions?: No Does the patient have difficulty walking or climbing stairs?: Yes Does the patient have difficulty dressing or bathing?: No Does the patient have difficulty doing errands alone such as visiting a doctor's office or shopping?: No    Assessment & Plan  1. Diabetic ulcer of toe of left foot associated with type 2 diabetes mellitus, unspecified ulcer stage ([place])  - Ambulatory referral to Podiatry

## 2020-09-10 ENCOUNTER — Ambulatory Visit (INDEPENDENT_AMBULATORY_CARE_PROVIDER_SITE_OTHER): Payer: PPO | Admitting: Family Medicine

## 2020-09-10 ENCOUNTER — Encounter: Payer: Self-pay | Admitting: Family Medicine

## 2020-09-10 ENCOUNTER — Other Ambulatory Visit: Payer: Self-pay

## 2020-09-10 VITALS — BP 130/86 | HR 89 | Temp 97.9°F | Resp 16 | Ht 68.0 in | Wt 271.0 lb

## 2020-09-10 DIAGNOSIS — E11621 Type 2 diabetes mellitus with foot ulcer: Secondary | ICD-10-CM

## 2020-09-10 DIAGNOSIS — L97529 Non-pressure chronic ulcer of other part of left foot with unspecified severity: Secondary | ICD-10-CM | POA: Diagnosis not present

## 2020-09-18 ENCOUNTER — Ambulatory Visit (INDEPENDENT_AMBULATORY_CARE_PROVIDER_SITE_OTHER): Payer: PPO

## 2020-09-18 ENCOUNTER — Other Ambulatory Visit: Payer: Self-pay

## 2020-09-18 ENCOUNTER — Ambulatory Visit: Payer: Managed Care, Other (non HMO) | Admitting: Podiatry

## 2020-09-18 DIAGNOSIS — E08621 Diabetes mellitus due to underlying condition with foot ulcer: Secondary | ICD-10-CM | POA: Diagnosis not present

## 2020-09-18 DIAGNOSIS — L97522 Non-pressure chronic ulcer of other part of left foot with fat layer exposed: Secondary | ICD-10-CM

## 2020-09-18 NOTE — Progress Notes (Signed)
   HPI: 65 y.o. female presenting today for evaluation of a recurrent ulcer to the left third toe.  Patient states that she has been rescheduled for knee surgery multiple times due to recurrent cellulitis and infection.  She was referred here for evaluation.  She has not been on antibiotics currently for some time now.  Past Medical History:  Diagnosis Date   Depression    Diabetes ([place])    Hyperlipidemia    Hypertension    Neuropathy due to type 2 diabetes mellitus ([place])    Obesity, morbid ([place]) 11/15/2009   Sleep apnea    cpap   Swelling      Physical Exam: General: The patient is alert and oriented x3 in no acute distress.  Dermatology: Skin is warm, dry and supple bilateral lower extremities. Negative for open lesions or macerations.  There is some superficial lightly adhered crust/eschar to the tip of the left third toe.  It is very superficial and with light debridement comes off quite easily with healthy underlying epidermis and skin  Vascular: Palpable pedal pulses bilaterally.  Capillary refill immediate.  No edema noted.  There is some very mild pinkness to the left third toe however this does not appear to be erythema or heat associated to an acute infection  Neurological: Epicritic and protective threshold diminished bilaterally.   Musculoskeletal Exam: Loss of range of motion to the IPJ of the right hallux secondary to prior surgery.  No pedal deformities noted at the moment.  Radiographic Exam:  Normal osseous mineralization. Joint spaces preserved. No fracture/dislocation/boney destruction.  Currently no evidence of cortical erosion or concern for osteomyelitis  Assessment: 1.  Ulcer left third toe with recurrent cellulitis; currently resolved   Plan of Care:  1. Patient evaluated. X-Rays reviewed.  2.  Currently there does not appear to be any acute or active infection in the foot or leg.  Patient has not been on oral antibiotics for quite a while now so she  states. 3.  From a podiatry standpoint, the patient is okay to proceed with knee surgery 4.  Return to clinic as needed      Edrick Kins, DPM [facility]  Dr. Edrick Kins, DPM    [address], [place] [address]                Office [phone]  Fax [phone]

## 2020-09-25 ENCOUNTER — Other Ambulatory Visit: Payer: Self-pay | Admitting: Family Medicine

## 2020-10-03 DIAGNOSIS — M109 Gout, unspecified: Secondary | ICD-10-CM | POA: Diagnosis not present

## 2020-10-03 DIAGNOSIS — M25562 Pain in left knee: Secondary | ICD-10-CM | POA: Diagnosis not present

## 2020-10-03 DIAGNOSIS — E785 Hyperlipidemia, unspecified: Secondary | ICD-10-CM | POA: Diagnosis not present

## 2020-10-03 DIAGNOSIS — M1712 Unilateral primary osteoarthritis, left knee: Secondary | ICD-10-CM | POA: Diagnosis not present

## 2020-10-03 DIAGNOSIS — Z7984 Long term (current) use of oral hypoglycemic drugs: Secondary | ICD-10-CM | POA: Diagnosis not present

## 2020-10-03 DIAGNOSIS — F419 Anxiety disorder, unspecified: Secondary | ICD-10-CM | POA: Diagnosis not present

## 2020-10-03 DIAGNOSIS — E559 Vitamin D deficiency, unspecified: Secondary | ICD-10-CM | POA: Diagnosis not present

## 2020-10-03 DIAGNOSIS — I10 Essential (primary) hypertension: Secondary | ICD-10-CM | POA: Diagnosis not present

## 2020-10-03 DIAGNOSIS — F32A Depression, unspecified: Secondary | ICD-10-CM | POA: Diagnosis not present

## 2020-10-03 DIAGNOSIS — Z6841 Body Mass Index (BMI) 40.0 and over, adult: Secondary | ICD-10-CM | POA: Diagnosis not present

## 2020-10-03 DIAGNOSIS — E1140 Type 2 diabetes mellitus with diabetic neuropathy, unspecified: Secondary | ICD-10-CM | POA: Diagnosis not present

## 2020-10-03 DIAGNOSIS — G8918 Other acute postprocedural pain: Secondary | ICD-10-CM | POA: Diagnosis not present

## 2020-10-03 DIAGNOSIS — G4733 Obstructive sleep apnea (adult) (pediatric): Secondary | ICD-10-CM | POA: Diagnosis not present

## 2020-10-04 DIAGNOSIS — M1712 Unilateral primary osteoarthritis, left knee: Secondary | ICD-10-CM | POA: Diagnosis not present

## 2020-10-11 DIAGNOSIS — M25562 Pain in left knee: Secondary | ICD-10-CM | POA: Diagnosis not present

## 2020-10-11 DIAGNOSIS — R600 Localized edema: Secondary | ICD-10-CM | POA: Diagnosis not present

## 2020-10-11 DIAGNOSIS — M25662 Stiffness of left knee, not elsewhere classified: Secondary | ICD-10-CM | POA: Diagnosis not present

## 2020-10-16 DIAGNOSIS — Z96652 Presence of left artificial knee joint: Secondary | ICD-10-CM | POA: Insufficient documentation

## 2020-10-16 DIAGNOSIS — Z96659 Presence of unspecified artificial knee joint: Secondary | ICD-10-CM | POA: Diagnosis not present

## 2020-10-17 DIAGNOSIS — M25562 Pain in left knee: Secondary | ICD-10-CM | POA: Diagnosis not present

## 2020-10-17 DIAGNOSIS — M25662 Stiffness of left knee, not elsewhere classified: Secondary | ICD-10-CM | POA: Diagnosis not present

## 2020-10-23 DIAGNOSIS — M25662 Stiffness of left knee, not elsewhere classified: Secondary | ICD-10-CM | POA: Diagnosis not present

## 2020-10-23 DIAGNOSIS — M25562 Pain in left knee: Secondary | ICD-10-CM | POA: Diagnosis not present

## 2020-11-01 DIAGNOSIS — M25662 Stiffness of left knee, not elsewhere classified: Secondary | ICD-10-CM | POA: Diagnosis not present

## 2020-11-01 DIAGNOSIS — M25562 Pain in left knee: Secondary | ICD-10-CM | POA: Diagnosis not present

## 2020-11-06 ENCOUNTER — Other Ambulatory Visit: Payer: Self-pay | Admitting: Family Medicine

## 2020-11-06 DIAGNOSIS — M25662 Stiffness of left knee, not elsewhere classified: Secondary | ICD-10-CM | POA: Diagnosis not present

## 2020-11-06 DIAGNOSIS — F339 Major depressive disorder, recurrent, unspecified: Secondary | ICD-10-CM

## 2020-11-06 DIAGNOSIS — M25562 Pain in left knee: Secondary | ICD-10-CM | POA: Diagnosis not present

## 2020-11-19 DIAGNOSIS — Z96652 Presence of left artificial knee joint: Secondary | ICD-10-CM | POA: Diagnosis not present

## 2020-11-21 DIAGNOSIS — M25662 Stiffness of left knee, not elsewhere classified: Secondary | ICD-10-CM | POA: Diagnosis not present

## 2020-11-21 DIAGNOSIS — M25562 Pain in left knee: Secondary | ICD-10-CM | POA: Diagnosis not present

## 2020-11-26 ENCOUNTER — Other Ambulatory Visit: Payer: Self-pay

## 2020-11-26 ENCOUNTER — Other Ambulatory Visit: Payer: Self-pay | Admitting: Family Medicine

## 2020-11-26 ENCOUNTER — Ambulatory Visit (INDEPENDENT_AMBULATORY_CARE_PROVIDER_SITE_OTHER): Payer: PPO | Admitting: Family Medicine

## 2020-11-26 ENCOUNTER — Encounter: Payer: Self-pay | Admitting: Family Medicine

## 2020-11-26 VITALS — BP 136/76 | HR 90 | Temp 97.7°F | Resp 16 | Ht 68.0 in | Wt 263.4 lb

## 2020-11-26 DIAGNOSIS — I152 Hypertension secondary to endocrine disorders: Secondary | ICD-10-CM

## 2020-11-26 DIAGNOSIS — E1159 Type 2 diabetes mellitus with other circulatory complications: Secondary | ICD-10-CM

## 2020-11-26 DIAGNOSIS — E559 Vitamin D deficiency, unspecified: Secondary | ICD-10-CM

## 2020-11-26 DIAGNOSIS — E669 Obesity, unspecified: Secondary | ICD-10-CM | POA: Diagnosis not present

## 2020-11-26 DIAGNOSIS — F39 Unspecified mood [affective] disorder: Secondary | ICD-10-CM | POA: Diagnosis not present

## 2020-11-26 DIAGNOSIS — Z23 Encounter for immunization: Secondary | ICD-10-CM

## 2020-11-26 DIAGNOSIS — M109 Gout, unspecified: Secondary | ICD-10-CM

## 2020-11-26 DIAGNOSIS — F339 Major depressive disorder, recurrent, unspecified: Secondary | ICD-10-CM

## 2020-11-26 DIAGNOSIS — M170 Bilateral primary osteoarthritis of knee: Secondary | ICD-10-CM | POA: Diagnosis not present

## 2020-11-26 DIAGNOSIS — E1169 Type 2 diabetes mellitus with other specified complication: Secondary | ICD-10-CM | POA: Diagnosis not present

## 2020-11-26 DIAGNOSIS — R600 Localized edema: Secondary | ICD-10-CM

## 2020-11-26 DIAGNOSIS — I10 Essential (primary) hypertension: Secondary | ICD-10-CM

## 2020-11-26 DIAGNOSIS — E785 Hyperlipidemia, unspecified: Secondary | ICD-10-CM

## 2020-11-26 LAB — POCT GLYCOSYLATED HEMOGLOBIN (HGB A1C): Hemoglobin A1C: 5.3 % (ref 4.0–5.6)

## 2020-11-26 MED ORDER — POTASSIUM CHLORIDE CRYS ER 20 MEQ PO TBCR: 20.0000 meq | EXTENDED_RELEASE_TABLET | Freq: Every day | ORAL | 0 refills | Status: DC | PRN

## 2020-11-26 MED ORDER — QUETIAPINE FUMARATE 25 MG PO TABS: 25.0000 mg | ORAL_TABLET | Freq: Every day | ORAL | 0 refills | Status: DC

## 2020-11-26 MED ORDER — LOSARTAN POTASSIUM 100 MG PO TABS: 100.0000 mg | ORAL_TABLET | Freq: Every day | ORAL | 1 refills | Status: DC

## 2020-11-26 MED ORDER — BUPROPION HCL ER (XL) 150 MG PO TB24: ORAL_TABLET | ORAL | 1 refills | Status: DC

## 2020-11-26 MED ORDER — FUROSEMIDE 40 MG PO TABS: 20.0000 mg | ORAL_TABLET | Freq: Every day | ORAL | 0 refills | Status: DC | PRN

## 2020-11-26 MED ORDER — VENLAFAXINE HCL ER 150 MG PO CP24: 150.0000 mg | ORAL_CAPSULE | Freq: Every day | ORAL | 1 refills | Status: DC

## 2020-11-26 MED ORDER — FENOFIBRATE 145 MG PO TABS: ORAL_TABLET | ORAL | 1 refills | Status: DC

## 2020-11-26 MED ORDER — METFORMIN HCL ER 750 MG PO TB24: ORAL_TABLET | ORAL | 0 refills | Status: DC

## 2020-11-26 NOTE — Progress Notes (Signed)
Name: Tina Short   MRN: [id number]    DOB: 1955-11-15   Date:11/26/2020       Progress Note  Subjective  Chief Complaint  Follow Up  HPI  DMII: she has a history of DM for many years, A1C was 6.8% it went up to 8 %  after that down to 6.8% today it is down to 5.3 % , we will stop Glipizide today, continue Metformin twice daily and Ozempic 0.5 mg weekly that she gets it through assistance program. FSBS has not dropped below 120 ( but she does not check very often)  She denies  polydipsia, no  polyuria. She takes statin, she stopped taking Lovaza ( due to size and bad taste)  and is back on fenofibrate , Losartan ( ARB) for microabuminuria She has neuropathy, she denies pain just has numbness and seems to be doing better. .She cannot  tolerate SGL-2 agonist because it caused yeast infection . Foot ulcer resolved    Morbid obesity: weight is down again, lost 15 lbs since June 2022 , she is taking Ozempic 0.5 mg weekly, she is getting it through assistance program.    Major Depression: Her father committed suicide in his 81's and there are other family members with history of psychiatric illness. She denies suicidal thoughts, but would be fine if she died, she states that is a chronic feeling, she states she would never do it. No planning. She still feels hurt about the break down of her previous church - where she was a member for almost 31 years. Not doing well since Spring 2022 , we added Wellbutrin , it seemed to help but now feeling down again,  Phq 9 stable at 8, but she states crying more than usual again, her son in law died during a car accident MVA. Discussed referral to psychiatrist and therapist but she continues to refuse it . We tried Abilify but caused SOB and had to stop it . We will try Seroquel   OSA: she uses CPAP every night. She had a repeat study, compliant with treatment. She states that if she does not use CPAP she feels shaky and has a headache when she wakes up.   B12  deficiency: she is back on B12 supplementation    Gout: no recent episodes, she is now on Allopurinol twice daily and denies side effects of medication, she is compliant with medication. No problems with gout    Vitamin D: she has rx vitamin D at home , she has been compliant . Unchanged    HTN: she is taking medications, no chest pain, palpitation or dizziness . BP today is under control   OA knee: worse on right side, daily pain, worse with activity or standing, she was seen at [facility] , had left knee replacement 10/03/2020 and is going to have right side done soon.   Patient Active Problem List   Diagnosis Date Noted   Candidiasis of skin 08/28/2020   Esophageal dysphagia    Colon cancer screening 04/30/2016   Major depression, recurrent, chronic ([place]) 10/30/2015   Dyslipidemia associated with type 2 diabetes mellitus ([place]) 10/30/2015   Vitamin D deficiency 07/27/2015   Vitamin B12 deficiency 07/27/2015   History of anemia 09/12/2014   Carpal tunnel syndrome 09/12/2014   Obstructive sleep apnea 09/12/2014   Edema of extremities 09/12/2014   Gout 09/12/2014   Morbid obesity, unspecified obesity type ([place]) 11/15/2009   Benign essential HTN 10/07/2006   Type 2 diabetes  mellitus, uncontrolled, with neuropathy 10/07/2006   Hyperlipidemia 10/07/2006    Past Surgical History:  Procedure Laterality Date   ABDOMINAL HYSTERECTOMY     COLONOSCOPY     COLONOSCOPY WITH PROPOFOL N/A 05/14/2016   Procedure: COLONOSCOPY WITH PROPOFOL;  Surgeon: [name], MD;  Location: [facility] ENDOSCOPY;  Service: Endoscopy;  Laterality: N/A;   COLONOSCOPY WITH PROPOFOL N/A 04/15/2018   Procedure: COLONOSCOPY WITH PROPOFOL;  Surgeon: Lin Landsman, MD;  Location: [facility] ENDOSCOPY;  Service: Gastroenterology;  Laterality: N/A;   ESOPHAGOGASTRODUODENOSCOPY (EGD) WITH PROPOFOL N/A 04/15/2018   Procedure: ESOPHAGOGASTRODUODENOSCOPY (EGD) WITH PROPOFOL;  Surgeon: Lin Landsman, MD;  [facility]  ENDOSCOPY;  Service: Gastroenterology;  Laterality: N/A;   FOOT SURGERY Right    X2   SHOULDER SURGERY Right    WRIST SURGERY Bilateral     Family History  Problem Relation Age of Onset   Diabetes Mother    Cancer Mother        uterine   Mental illness Father    Obesity Daughter    Diabetes Maternal Grandmother    Heart disease Brother    Obesity Daughter     Social History   Tobacco Use   Smoking status: Never   Smokeless tobacco: Never  Substance Use Topics   Alcohol use: No    Alcohol/week: 0.0 standard drinks     Current Outpatient Medications:    allopurinol (ZYLOPRIM) 100 MG tablet, Take 1 tablet (100 mg total) by mouth 2 (two) times daily., Disp: 180 tablet, Rfl: 1   atorvastatin (LIPITOR) 40 MG tablet, Take 1 tablet (40 mg total) by mouth at bedtime., Disp: 90 tablet, Rfl: 1   blood glucose meter kit and supplies, Dispense based on patient and insurance preference. Use up to four times daily as directed. (FOR ICD-10 E10.9, E11.9)., Disp: 1 each, Rfl: 0   buPROPion (WELLBUTRIN XL) 150 MG 24 hr tablet, TAKE 1 TABLET(150 MG) BY MOUTH DAILY WITH BREAKFAST, Disp: 90 tablet, Rfl: 0   fenofibrate (TRICOR) 145 MG tablet, TAKE 1 TABLET(145 MG) BY MOUTH DAILY, Disp: 90 tablet, Rfl: 0   glipiZIDE (GLUCOTROL XL) 5 MG 24 hr tablet, TAKE 1 TABLET(5 MG) BY MOUTH DAILY WITH BREAKFAST, Disp: 90 tablet, Rfl: 1   losartan (COZAAR) 100 MG tablet, Take 1 tablet (100 mg total) by mouth daily., Disp: 90 tablet, Rfl: 0   metFORMIN (GLUCOPHAGE-XR) 750 MG 24 hr tablet, TAKE 1 TABLET(750 MG) BY MOUTH TWICE DAILY, Disp: 180 tablet, Rfl: 0   ONETOUCH ULTRA test strip, TEST UP TO FOUR TIMES A DAY AS DIRECTED, Disp: 300 strip, Rfl: 0   Semaglutide,0.25 or 0.5MG/DOS, (OZEMPIC, 0.25 OR 0.5 MG/DOSE,) 2 MG/1.5ML SOPN, Inject 0.25 mg into the skin once a week. Through [employer] PAP, Disp: 1.5 mL, Rfl:    venlafaxine XR (EFFEXOR-XR) 150 MG 24 hr capsule, Take 1 capsule (150 mg total) by mouth daily  with breakfast., Disp: 90 capsule, Rfl: 1   vitamin B-12 (CYANOCOBALAMIN) 500 MCG tablet, Take 500 mcg by mouth daily., Disp: , Rfl:    Vitamin D, Ergocalciferol, (DRISDOL) 1.25 MG (50000 UNIT) CAPS capsule, Take 1 capsule (50,000 Units total) by mouth every 7 (seven) days., Disp: 12 capsule, Rfl: 1   furosemide (LASIX) 40 MG tablet, Daily (Patient not taking: Reported on 11/26/2020), Disp: 90 tablet, Rfl: 1   potassium chloride SA (K-DUR) 20 MEQ tablet, TAKE 1 TABLET(20 MEQ) BY MOUTH DAILY (Patient not taking: Reported on 11/26/2020), Disp: 90 tablet, Rfl: 0  Allergies  Allergen Reactions   Abilify [Aripiprazole] Shortness Of Breath    Other reaction(s): Difficulty breathing   Invokana [Canagliflozin] Itching    Yeast infections   Metformin And Related     Diarrhea, but doing well with XR formulation     Lovaza [Omega-3-Acid Ethyl Esters] Other (See Comments)    Burping     I personally reviewed active problem list, medication list, allergies, family history, social history with the patient/caregiver today.   ROS  Constitutional: Negative for fever , positive for weight change.  Respiratory: Negative for cough and shortness of breath.   Cardiovascular: Negative for chest pain or palpitations.  Gastrointestinal: Negative for abdominal pain, no bowel changes.  Musculoskeletal: Negative for gait problem or joint swelling.  Skin: Negative for rash.  Neurological: Negative for dizziness or headache.  No other specific complaints in a complete review of systems (except as listed in HPI above).   Objective  Vitals:   11/26/20 1133 11/26/20 1148  BP: 140/78 136/76  Pulse: 90   Resp: 16   Temp: 97.7 F (36.5 C)   TempSrc: Oral   SpO2: 95%   Weight: 263 lb 6.4 oz (119.5 kg)   Height: _0  (1.727 m)     Body mass index is 40.05 kg/m.  Physical Exam  Constitutional: Patient appears well-developed and well-nourished. Obese  No distress.  HEENT: head atraumatic,  normocephalic, pupils equal and reactive to light, neck supple, throat within normal limits Cardiovascular: Normal rate, regular rhythm and normal heart sounds.  No murmur heard. No BLE edema. Pulmonary/Chest: Effort normal and breath sounds normal. No respiratory distress. Abdominal: Soft.  There is no tenderness. Psychiatric: Patient has a normal mood and affect. behavior is normal. Judgment and thought content normal.    PHQ2/9: Depression screen [facility] 2/9 11/26/2020 09/10/2020 07/26/2020 07/10/2020 05/21/2020  Decreased Interest 0 _1 Down, Depressed, Hopeless 0 _2 PHQ - 2 Score 0 _3 Altered sleeping 0 _4 Tired, decreased energy 0 _5 Change in appetite 0 0 1 0 3  Feeling bad or failure about yourself  0 0 _6 Trouble concentrating 0 0 0 0 0  Moving slowly or fidgety/restless 0 0 0 0 0  Suicidal thoughts 0 0 0 0 0  PHQ-9 Score 0 _7 Difficult doing work/chores Not difficult at all - Very difficult Somewhat difficult -  Some recent data might be hidden    phq 9 is positive   Fall Risk: Fall Risk  11/26/2020 09/10/2020 07/26/2020 07/10/2020 05/17/2020  Falls in the past year? 0 0 1 1 0  Number falls in past yr: 0 0 0 0 0  Injury with Fall? 0 0 0 0 -  Risk for fall due to : No Fall Risks - History of fall(s) Impaired mobility Medication side effect;Other (Comment);Impaired mobility  Risk for fall due to: Comment - - - - Dizziness/Pending bilateral knee replacement  Follow up Falls prevention discussed - Falls prevention discussed Falls prevention discussed Falls prevention discussed     Assessment & Plan  1. Diabetes mellitus type 2 in obese (HCC)  - POCT HgB A1C  2. Need for influenza vaccination  - Flu Vaccine QUAD High Dose(Fluad)  3. Dyslipidemia associated with type 2 diabetes mellitus ([place])   4. Benign essential HTN  - losartan (COZAAR) 100 MG tablet; Take 1 tablet (100 mg  total) by mouth daily.  Dispense: 90 tablet; Refill: 1  5.  Controlled gout   6. Mood disorder ([place])  Could not tolerate Abilify but would like to try low dose seoquel   7. Primary osteoarthritis of both knees  Had left knee replacement, getting PT   8. Hypertension associated with type 2 diabetes mellitus ([place])  At goal   9. Morbid obesity ([place])  Doing well   10. Major depression, recurrent, chronic (HCC)  - buPROPion (WELLBUTRIN XL) 150 MG 24 hr tablet; TAKE 1 TABLET(150 MG) BY MOUTH DAILY WITH BREAKFAST  Dispense: 90 tablet; Refill: 1 - venlafaxine XR (EFFEXOR-XR) 150 MG 24 hr capsule; Take 1 capsule (150 mg total) by mouth daily with breakfast.  Dispense: 90 capsule; Refill: 1 - QUEtiapine (SEROQUEL) 25 MG tablet; Take 1 tablet (25 mg total) by mouth at bedtime.  Dispense: 30 tablet; Refill: 0  11. Bilateral lower extremity edema  - potassium chloride SA (KLOR-CON) 20 MEQ tablet; Take 1 tablet (20 mEq total) by mouth daily as needed.  Dispense: 30 tablet; Refill: 0 - furosemide (LASIX) 40 MG tablet; Take 0.5 tablets (20 mg total) by mouth daily as needed. Daily  Dispense: 30 tablet; Refill: 0  12. Vitamin D deficiency

## 2020-11-27 DIAGNOSIS — M25662 Stiffness of left knee, not elsewhere classified: Secondary | ICD-10-CM | POA: Diagnosis not present

## 2020-11-27 DIAGNOSIS — M25562 Pain in left knee: Secondary | ICD-10-CM | POA: Diagnosis not present

## 2020-12-12 ENCOUNTER — Encounter: Payer: Self-pay | Admitting: Family Medicine

## 2020-12-28 ENCOUNTER — Other Ambulatory Visit: Payer: Self-pay | Admitting: Family Medicine

## 2020-12-28 DIAGNOSIS — F339 Major depressive disorder, recurrent, unspecified: Secondary | ICD-10-CM

## 2020-12-29 NOTE — Telephone Encounter (Signed)
Requested medications are due for refill today early request  Requested medications are on the active medication list yes  Last refill 12/11/20  Last visit 11/26/20  Future visit scheduled 04/03/21  Notes to clinic This medication can not be delegated, labs over 180 days, please assess.   Requested Prescriptions  Pending Prescriptions Disp Refills   QUEtiapine (SEROQUEL) 25 MG tablet [Pharmacy Med Name: QUETIAPINE 25MG  TABLETS] 30 tablet 0    Sig: TAKE 1 TABLET(25 MG) BY MOUTH AT BEDTIME     Not Delegated - Psychiatry:  Antipsychotics - Second Generation (Atypical) - quetiapine Failed - 12/28/2020  5:10 PM      Failed - This refill cannot be delegated      Failed - ALT in normal range and within 180 days    ALT  Date Value Ref Range Status  05/08/2020 31 0 - 32 IU/L Final          Failed - AST in normal range and within 180 days    AST  Date Value Ref Range Status  05/08/2020 39 0 - 40 IU/L Final          Passed - Completed PHQ-2 or PHQ-9 in the last 360 days      Passed - Last BP in normal range    BP Readings from Last 1 Encounters:  11/26/20 136/76          Passed - Valid encounter within last 6 months    Recent Outpatient Visits           1 month ago Diabetes mellitus type 2 in obese [facility])   [facility] Steele Sizer, MD   3 months ago Diabetic ulcer of toe of left foot associated with type 2 diabetes mellitus, unspecified ulcer stage [facility])   [facility] [place], Drue Stager, MD   5 months ago Diabetes mellitus type 2 in obese [facility])   [facility] [place], Drue Stager, MD   7 months ago Controlled type 2 diabetes mellitus with neuropathy [facility])   [facility] Steele Sizer, MD   11 months ago Controlled type 2 diabetes mellitus with neuropathy [facility])   [facility] Steele Sizer, MD       Future Appointments             In 3 months Ancil Boozer, Drue Stager, MD [facility], [place]   In 6 months  [facility], [facility]

## 2020-12-31 ENCOUNTER — Other Ambulatory Visit: Payer: Self-pay | Admitting: Family Medicine

## 2021-01-01 DIAGNOSIS — Z96652 Presence of left artificial knee joint: Secondary | ICD-10-CM | POA: Diagnosis not present

## 2021-01-17 DIAGNOSIS — E119 Type 2 diabetes mellitus without complications: Secondary | ICD-10-CM | POA: Diagnosis not present

## 2021-01-17 LAB — HM DIABETES EYE EXAM

## 2021-01-26 ENCOUNTER — Other Ambulatory Visit: Payer: Self-pay | Admitting: Family Medicine

## 2021-01-26 DIAGNOSIS — E1140 Type 2 diabetes mellitus with diabetic neuropathy, unspecified: Secondary | ICD-10-CM

## 2021-01-27 DIAGNOSIS — J320 Chronic maxillary sinusitis: Secondary | ICD-10-CM | POA: Diagnosis not present

## 2021-01-27 DIAGNOSIS — M25512 Pain in left shoulder: Secondary | ICD-10-CM | POA: Diagnosis not present

## 2021-01-27 DIAGNOSIS — S12300A Unspecified displaced fracture of fourth cervical vertebra, initial encounter for closed fracture: Secondary | ICD-10-CM | POA: Diagnosis not present

## 2021-01-27 DIAGNOSIS — K029 Dental caries, unspecified: Secondary | ICD-10-CM | POA: Diagnosis not present

## 2021-01-27 DIAGNOSIS — Z7984 Long term (current) use of oral hypoglycemic drugs: Secondary | ICD-10-CM | POA: Diagnosis not present

## 2021-01-27 DIAGNOSIS — S01511A Laceration without foreign body of lip, initial encounter: Secondary | ICD-10-CM | POA: Diagnosis not present

## 2021-01-27 DIAGNOSIS — G4733 Obstructive sleep apnea (adult) (pediatric): Secondary | ICD-10-CM | POA: Diagnosis not present

## 2021-01-27 DIAGNOSIS — M25511 Pain in right shoulder: Secondary | ICD-10-CM | POA: Diagnosis not present

## 2021-01-27 DIAGNOSIS — S60412A Abrasion of right middle finger, initial encounter: Secondary | ICD-10-CM | POA: Diagnosis not present

## 2021-01-27 DIAGNOSIS — M2578 Osteophyte, vertebrae: Secondary | ICD-10-CM | POA: Diagnosis not present

## 2021-01-27 DIAGNOSIS — S6992XA Unspecified injury of left wrist, hand and finger(s), initial encounter: Secondary | ICD-10-CM | POA: Diagnosis not present

## 2021-01-27 DIAGNOSIS — M19042 Primary osteoarthritis, left hand: Secondary | ICD-10-CM | POA: Diagnosis not present

## 2021-01-27 DIAGNOSIS — S80212A Abrasion, left knee, initial encounter: Secondary | ICD-10-CM | POA: Diagnosis not present

## 2021-01-27 DIAGNOSIS — I10 Essential (primary) hypertension: Secondary | ICD-10-CM | POA: Diagnosis not present

## 2021-01-27 DIAGNOSIS — Z043 Encounter for examination and observation following other accident: Secondary | ICD-10-CM | POA: Diagnosis not present

## 2021-01-27 DIAGNOSIS — S022XXA Fracture of nasal bones, initial encounter for closed fracture: Secondary | ICD-10-CM | POA: Diagnosis not present

## 2021-01-27 DIAGNOSIS — S12200A Unspecified displaced fracture of third cervical vertebra, initial encounter for closed fracture: Secondary | ICD-10-CM | POA: Diagnosis not present

## 2021-01-27 DIAGNOSIS — M542 Cervicalgia: Secondary | ICD-10-CM | POA: Diagnosis not present

## 2021-01-27 DIAGNOSIS — E119 Type 2 diabetes mellitus without complications: Secondary | ICD-10-CM | POA: Diagnosis not present

## 2021-01-27 DIAGNOSIS — S60413A Abrasion of left middle finger, initial encounter: Secondary | ICD-10-CM | POA: Diagnosis not present

## 2021-01-27 DIAGNOSIS — S80211A Abrasion, right knee, initial encounter: Secondary | ICD-10-CM | POA: Diagnosis not present

## 2021-01-27 DIAGNOSIS — M47812 Spondylosis without myelopathy or radiculopathy, cervical region: Secondary | ICD-10-CM | POA: Diagnosis not present

## 2021-01-27 NOTE — Telephone Encounter (Signed)
last RF 11/26/20 DC'd by provider Dr Ancil Boozer  Requested Prescriptions  Refused Prescriptions Disp Refills   glipiZIDE (GLUCOTROL XL) 5 MG 24 hr tablet [Pharmacy Med Name: GLIPIZIDE ER 5MG  TABLETS] 90 tablet 1    Sig: TAKE 1 TABLET(5 MG) BY MOUTH DAILY WITH BREAKFAST     Endocrinology:  Diabetes - Sulfonylureas Passed - 01/26/2021  2:16 PM      Passed - HBA1C is between 0 and 7.9 and within 180 days    Hemoglobin A1C  Date Value Ref Range Status  11/26/2020 5.3 4.0 - 5.6 % Final   HbA1c, POC (prediabetic range)  Date Value Ref Range Status  01/29/2018 6.7 (A) 5.7 - 6.4 % Final   HbA1c, POC (controlled diabetic range)  Date Value Ref Range Status  01/29/2018 6.7 0.0 - 7.0 % Final   HbA1c POC (<> result, manual entry)  Date Value Ref Range Status  01/29/2018 6.7 4.0 - 5.6 % Final   Hgb A1c MFr Bld  Date Value Ref Range Status  05/08/2020 7.7 (H) 4.8 - 5.6 % Final    Comment:             Prediabetes: 5.7 - 6.4          Diabetes: >6.4          Glycemic control for adults with diabetes: <7.0          Passed - Valid encounter within last 6 months    Recent Outpatient Visits          2 months ago Diabetes mellitus type 2 in obese [facility])   [facility] Steele Sizer, MD   4 months ago Diabetic ulcer of toe of left foot associated with type 2 diabetes mellitus, unspecified ulcer stage [facility])   [facility] Steele Sizer, MD   6 months ago Diabetes mellitus type 2 in obese [facility])   [facility] [place], Drue Stager, MD   8 months ago Controlled type 2 diabetes mellitus with neuropathy [facility])   [facility] [place], Drue Stager, MD   1 year ago Controlled type 2 diabetes mellitus with neuropathy [facility])   [facility] Steele Sizer, MD      Future Appointments            In 2 months Ancil Boozer, Drue Stager, MD [facility], [place]   In 5 months  [facility], [facility]

## 2021-01-28 DIAGNOSIS — M2578 Osteophyte, vertebrae: Secondary | ICD-10-CM | POA: Diagnosis not present

## 2021-01-28 DIAGNOSIS — S12200A Unspecified displaced fracture of third cervical vertebra, initial encounter for closed fracture: Secondary | ICD-10-CM | POA: Diagnosis not present

## 2021-01-28 DIAGNOSIS — S12300A Unspecified displaced fracture of fourth cervical vertebra, initial encounter for closed fracture: Secondary | ICD-10-CM | POA: Diagnosis not present

## 2021-01-31 ENCOUNTER — Encounter: Payer: Self-pay | Admitting: Family Medicine

## 2021-02-01 DIAGNOSIS — S60943A Unspecified superficial injury of left middle finger, initial encounter: Secondary | ICD-10-CM | POA: Diagnosis not present

## 2021-02-01 DIAGNOSIS — K130 Diseases of lips: Secondary | ICD-10-CM | POA: Diagnosis not present

## 2021-02-01 DIAGNOSIS — L089 Local infection of the skin and subcutaneous tissue, unspecified: Secondary | ICD-10-CM | POA: Diagnosis not present

## 2021-02-15 DIAGNOSIS — M20012 Mallet finger of left finger(s): Secondary | ICD-10-CM | POA: Diagnosis not present

## 2021-02-15 DIAGNOSIS — S12201A Unspecified nondisplaced fracture of third cervical vertebra, initial encounter for closed fracture: Secondary | ICD-10-CM | POA: Diagnosis not present

## 2021-02-20 DIAGNOSIS — M20012 Mallet finger of left finger(s): Secondary | ICD-10-CM | POA: Diagnosis not present

## 2021-02-22 DIAGNOSIS — S12201A Unspecified nondisplaced fracture of third cervical vertebra, initial encounter for closed fracture: Secondary | ICD-10-CM | POA: Diagnosis not present

## 2021-03-15 ENCOUNTER — Other Ambulatory Visit: Payer: Self-pay | Admitting: Family Medicine

## 2021-03-15 DIAGNOSIS — E559 Vitamin D deficiency, unspecified: Secondary | ICD-10-CM

## 2021-03-19 DIAGNOSIS — M20012 Mallet finger of left finger(s): Secondary | ICD-10-CM | POA: Diagnosis not present

## 2021-04-02 NOTE — Progress Notes (Signed)
Name: Tina Short   MRN: [id number]    DOB: [birth year]   Date:04/03/2021       Progress Note  Subjective  Chief Complaint  Follow Up  HPI  DMII: she has a history of DM for many years, A1C was 6.8% it went up to 8 %  after that down to 6.8% today it is down to 5.3 % , she is off glipizide, continue Metformin twice daily and Ozempic 0.5 mg weekly that she gets it through assistance program. FSBS has not dropped below 120 ( but she does not check very often)  She denies  polydipsia, no  polyuria. She takes statin, she stopped taking Lovaza ( due to size and bad taste)  and is back on fenofibrate , Losartan ( ARB) for microabuminuria She has neuropathy, she denies pain just has numbness and seems to be doing better. She cannot tolerate SGL-2 agonist because it caused yeast infection . Not checking fsbs at this time   Morbid obesity: weight is down again,lost another 11 lbs since last visit  she is taking Ozempic 0.5 mg weekly, she is getting it through assistance program.     Major Depression: Her father committed suicide in his 56's and there are other family members with history of psychiatric illness. She denies suicidal thoughts, but would be fine if she died, she states that is a chronic feeling, she states she would never do it. No planning. She still feels hurt about the break down of her previous church - where she was a member for almost 52 years. Not doing well since Spring 2022 , we added Wellbutrin and also seroquel for sleep .  Discussed referral to psychiatrist and therapist but she continues to refuse it due to cost. She is feeling okay at this time.   OSA: she uses CPAP every night. She had a repeat study, compliant with treatment. She states that if she does not use CPAP she feels shaky and has a headache when she wakes up.   B12 deficiency: she is back on B12 supplementation    Gout: no recent episodes, she is now on Allopurinol twice daily and denies side effects of medication,  she is compliant with medication. She has been asymptomatic    Vitamin D: she has rx vitamin D at home , she has been compliant .Continue supplementation     HTN: she is taking medications, no chest pain, palpitation or dizziness . BP today is under control , continue current medications   OA knee: worse on right side, daily pain, worse with activity or standing, she was seen at [facility] , had left knee replacement 10/03/2020 , she want to hold off on right knee replacement at this time   Recent fall: tripped and fell forward on her face back in 01/27/21. She went to [facility], had CT head, face and neck, she had to wear a neck brace for c spine fracture and now neck pain is back to baseline, she had laceration on upper lip and facial injury that is back to normal now. She also had injury of left middle finger and still needs to wear a brace and is still red but not painful now with touch.  DDD cervical spine: per patient neurosurgeon thinks the cause of her choking and coughing, but no need for intervention at this time  Patient Active Problem List   Diagnosis Date Noted   History of total knee replacement, left 10/16/2020   Candidiasis of skin 08/28/2020  Esophageal dysphagia    Colon cancer screening 04/30/2016   Major depression, recurrent, chronic ([place]) 10/30/2015   Dyslipidemia associated with type 2 diabetes mellitus ([place]) 10/30/2015   Vitamin D deficiency 07/27/2015   Vitamin B12 deficiency 07/27/2015   History of anemia 09/12/2014   Carpal tunnel syndrome 09/12/2014   Obstructive sleep apnea 09/12/2014   Edema of extremities 09/12/2014   Gout 09/12/2014   Morbid obesity ([place]) 11/15/2009   Benign essential HTN 10/07/2006   Type 2 diabetes mellitus, uncontrolled, with neuropathy 10/07/2006   Hyperlipidemia 10/07/2006    Past Surgical History:  Procedure Laterality Date   ABDOMINAL HYSTERECTOMY     COLONOSCOPY     COLONOSCOPY WITH PROPOFOL N/A 05/14/2016   Procedure:  COLONOSCOPY WITH PROPOFOL;  Surgeon: [name], MD;  Location: [facility] ENDOSCOPY;  Service: Endoscopy;  Laterality: N/A;   COLONOSCOPY WITH PROPOFOL N/A 04/15/2018   Procedure: COLONOSCOPY WITH PROPOFOL;  Surgeon: Lin Landsman, MD;  Location: [facility] ENDOSCOPY;  Service: Gastroenterology;  Laterality: N/A;   ESOPHAGOGASTRODUODENOSCOPY (EGD) WITH PROPOFOL N/A 04/15/2018   Procedure: ESOPHAGOGASTRODUODENOSCOPY (EGD) WITH PROPOFOL;  Surgeon: Lin Landsman, MD;  Location: [facility] ENDOSCOPY;  Service: Gastroenterology;  Laterality: N/A;   FOOT SURGERY Right    X2   SHOULDER SURGERY Right    TOTAL KNEE ARTHROPLASTY Left    Dr. Harlow Mares at [facility]   WRIST SURGERY Bilateral     Family History  Problem Relation Age of Onset   Diabetes Mother    Cancer Mother        uterine   Mental illness Father    Obesity Daughter    Diabetes Maternal Grandmother    Heart disease Brother    Obesity Daughter     Social History   Tobacco Use   Smoking status: Never   Smokeless tobacco: Never  Substance Use Topics   Alcohol use: No    Alcohol/week: 0.0 standard drinks     Current Outpatient Medications:    blood glucose meter kit and supplies, Dispense based on patient and insurance preference. Use up to four times daily as directed. (FOR ICD-10 E10.9, E11.9)., Disp: 1 each, Rfl: 0   Cholecalciferol (VITAMIN D) 50 MCG (2000 UT) CAPS, Take 1 capsule by mouth daily at 12 noon., Disp: , Rfl:    fenofibrate (TRICOR) 145 MG tablet, TAKE 1 TABLET(145 MG) BY MOUTH DAILY, Disp: 90 tablet, Rfl: 1   metFORMIN (GLUCOPHAGE-XR) 750 MG 24 hr tablet, TAKE 1 TABLET(750 MG) BY MOUTH TWICE DAILY, Disp: 180 tablet, Rfl: 0   ONETOUCH ULTRA test strip, TEST UP TO FOUR TIMES A DAY AS DIRECTED, Disp: 300 strip, Rfl: 0   QUEtiapine (SEROQUEL) 25 MG tablet, TAKE 1 TABLET(25 MG) BY MOUTH AT BEDTIME, Disp: 90 tablet, Rfl: 0   Semaglutide,0.25 or 0.5MG/DOS, (OZEMPIC, 0.25 OR 0.5 MG/DOSE,) 2 MG/1.5ML SOPN,  Inject 0.25 mg into the skin once a week. Through [employer] PAP, Disp: 1.5 mL, Rfl:    allopurinol (ZYLOPRIM) 100 MG tablet, Take 1 tablet (100 mg total) by mouth 2 (two) times daily., Disp: 180 tablet, Rfl: 1   atorvastatin (LIPITOR) 40 MG tablet, Take 1 tablet (40 mg total) by mouth at bedtime., Disp: 90 tablet, Rfl: 1   buPROPion (WELLBUTRIN XL) 150 MG 24 hr tablet, TAKE 1 TABLET(150 MG) BY MOUTH DAILY WITH BREAKFAST, Disp: 90 tablet, Rfl: 1   losartan (COZAAR) 100 MG tablet, Take 1 tablet (100 mg total) by mouth daily., Disp: 90 tablet, Rfl: 1  venlafaxine XR (EFFEXOR-XR) 150 MG 24 hr capsule, Take 1 capsule (150 mg total) by mouth daily with breakfast., Disp: 90 capsule, Rfl: 1   vitamin B-12 (CYANOCOBALAMIN) 500 MCG tablet, Take 500 mcg by mouth daily. (Patient not taking: Reported on 04/03/2021), Disp: , Rfl:   Allergies  Allergen Reactions   Abilify [Aripiprazole] Shortness Of Breath    Other reaction(s): Difficulty breathing   Invokana [Canagliflozin] Itching    Yeast infections   Metformin And Related     Diarrhea, but doing well with XR formulation     Lovaza [Omega-3-Acid Ethyl Esters] Other (See Comments)    Burping     I personally reviewed active problem list, medication list, allergies, family history, social history, health maintenance with the patient/caregiver today.   ROS  Constitutional: Negative for fever , positive for weight change.  Respiratory: Negative for cough and shortness of breath.   Cardiovascular: Negative for chest pain or palpitations.  Gastrointestinal: Negative for abdominal pain, no bowel changes.  Musculoskeletal: positive  for gait problem but no  joint swelling.  Skin: Negative for rash.  Neurological: Negative for dizziness or headache.  No other specific complaints in a complete review of systems (except as listed in HPI above).   Objective  Vitals:   04/03/21 1122 04/03/21 1134  BP: 140/72 132/70  Pulse: 91   Resp: 16   SpO2:  98%   Weight: 252 lb (114.3 kg)   Height: '5\' 9"'  (1.753 m)     Body mass index is 37.21 kg/m.  Physical Exam  Constitutional: Patient appears well-developed and well-nourished. Obese  No distress.  HEENT: head atraumatic, normocephalic, pupils equal and reactive to light, neck supple Cardiovascular: Normal rate, regular rhythm and normal heart sounds.  No murmur heard. No BLE edema. Pulmonary/Chest: Effort normal and breath sounds normal. No respiratory distress. Abdominal: Soft.  There is no tenderness. Psychiatric: Patient has a normal mood and affect. behavior is normal. Judgment and thought content normal.   Recent Results (from the past 2160 hour(s))  HM DIABETES EYE EXAM     Status: None   Collection Time: 01/17/21 12:00 AM  Result Value Ref Range   HM Diabetic Eye Exam No Retinopathy No Retinopathy    Comment: [place] eye    Diabetic Foot Exam: Diabetic Foot Exam - Simple   Simple Foot Form Visual Inspection See comments: Yes Sensation Testing Intact to touch and monofilament testing bilaterally: Yes Pulse Check Posterior Tibialis and Dorsalis pulse intact bilaterally: Yes Comments Thick toenails, callus formation on medial foot       PHQ2/9: Depression screen [facility] 2/9 04/03/2021 11/26/2020 09/10/2020 07/26/2020 07/10/2020  Decreased Interest '1 2 2 1 3  ' Down, Depressed, Hopeless '2 2 2 1 3  ' PHQ - 2 Score '3 4 4 2 6  ' Altered sleeping 0 0 '1 3 3  ' Tired, decreased energy '2 1 3 2 3  ' Change in appetite 0 0 0 1 0  Feeling bad or failure about yourself  1 2 0 1 2  Trouble concentrating 0 0 0 0 0  Moving slowly or fidgety/restless 0 0 0 0 0  Suicidal thoughts 0 1 0 0 0  PHQ-9 Score '6 8 8 9 14  ' Difficult doing work/chores - Very difficult - Very difficult Somewhat difficult  Some recent data might be hidden    phq 9 is positive   Fall Risk: Fall Risk  04/03/2021 11/26/2020 09/10/2020 07/26/2020 07/10/2020  Falls in the past year? 1 0 0 1  1  Number falls in past yr: 0 0 0  0 0  Injury with Fall? 1 0 0 0 0  Risk for fall due to : No Fall Risks No Fall Risks - History of fall(s) Impaired mobility  Risk for fall due to: Comment - - - - -  Follow up Falls prevention discussed Falls prevention discussed - Falls prevention discussed Falls prevention discussed      Functional Status Survey: Is the patient deaf or have difficulty hearing?: No Does the patient have difficulty seeing, even when wearing glasses/contacts?: No Does the patient have difficulty concentrating, remembering, or making decisions?: No Does the patient have difficulty walking or climbing stairs?: Yes Does the patient have difficulty dressing or bathing?: No Does the patient have difficulty doing errands alone such as visiting a doctor's office or shopping?: No    Assessment & Plan  1. Dyslipidemia associated with type 2 diabetes mellitus (HCC)  - HgB A1c - HM Diabetes Foot Exam - atorvastatin (LIPITOR) 40 MG tablet; Take 1 tablet (40 mg total) by mouth at bedtime.  Dispense: 90 tablet; Refill: 1 - Lipid panel - Microalbumin / creatinine urine ratio  2. Breast cancer screening by mammogram  - MM 3D SCREEN BREAST BILATERAL; Future  3. Benign essential HTN  - losartan (COZAAR) 100 MG tablet; Take 1 tablet (100 mg total) by mouth daily.  Dispense: 90 tablet; Refill: 1 - CBC with Differential/Platelet - COMPLETE METABOLIC PANEL WITH GFR  4. Morbid obesity ([place])  Discussed with the patient the risk posed by an increased BMI. Discussed importance of portion control, calorie counting and at least 150 minutes of physical activity weekly. Avoid sweet beverages and drink more water. Eat at least 6 servings of fruit and vegetables daily   Doing well with Ozempic   5. Mood disorder (HCC)  Continue medications  6. Vitamin B12 deficiency   7. Vitamin D deficiency   8. Hypertension associated with type 2 diabetes mellitus ([place])   9. Mixed dyslipidemia  - atorvastatin (LIPITOR) 40  MG tablet; Take 1 tablet (40 mg total) by mouth at bedtime.  Dispense: 90 tablet; Refill: 1  10. History of recent fall   11. Controlled gout  - allopurinol (ZYLOPRIM) 100 MG tablet; Take 1 tablet (100 mg total) by mouth 2 (two) times daily.  Dispense: 180 tablet; Refill: 1  12. Major depression, recurrent, chronic (HCC)  - buPROPion (WELLBUTRIN XL) 150 MG 24 hr tablet; TAKE 1 TABLET(150 MG) BY MOUTH DAILY WITH BREAKFAST  Dispense: 90 tablet; Refill: 1 - venlafaxine XR (EFFEXOR-XR) 150 MG 24 hr capsule; Take 1 capsule (150 mg total) by mouth daily with breakfast.  Dispense: 90 capsule; Refill: 1

## 2021-04-03 ENCOUNTER — Encounter: Payer: Self-pay | Admitting: Family Medicine

## 2021-04-03 ENCOUNTER — Ambulatory Visit (INDEPENDENT_AMBULATORY_CARE_PROVIDER_SITE_OTHER): Payer: PPO | Admitting: Family Medicine

## 2021-04-03 VITALS — BP 132/70 | HR 91 | Resp 16 | Ht 69.0 in | Wt 252.0 lb

## 2021-04-03 DIAGNOSIS — E538 Deficiency of other specified B group vitamins: Secondary | ICD-10-CM | POA: Diagnosis not present

## 2021-04-03 DIAGNOSIS — F339 Major depressive disorder, recurrent, unspecified: Secondary | ICD-10-CM

## 2021-04-03 DIAGNOSIS — I152 Hypertension secondary to endocrine disorders: Secondary | ICD-10-CM

## 2021-04-03 DIAGNOSIS — Z1231 Encounter for screening mammogram for malignant neoplasm of breast: Secondary | ICD-10-CM

## 2021-04-03 DIAGNOSIS — I10 Essential (primary) hypertension: Secondary | ICD-10-CM

## 2021-04-03 DIAGNOSIS — E782 Mixed hyperlipidemia: Secondary | ICD-10-CM

## 2021-04-03 DIAGNOSIS — M109 Gout, unspecified: Secondary | ICD-10-CM | POA: Diagnosis not present

## 2021-04-03 DIAGNOSIS — F39 Unspecified mood [affective] disorder: Secondary | ICD-10-CM

## 2021-04-03 DIAGNOSIS — E559 Vitamin D deficiency, unspecified: Secondary | ICD-10-CM

## 2021-04-03 DIAGNOSIS — E1159 Type 2 diabetes mellitus with other circulatory complications: Secondary | ICD-10-CM

## 2021-04-03 DIAGNOSIS — E1169 Type 2 diabetes mellitus with other specified complication: Secondary | ICD-10-CM | POA: Diagnosis not present

## 2021-04-03 DIAGNOSIS — Z9181 History of falling: Secondary | ICD-10-CM

## 2021-04-03 DIAGNOSIS — E785 Hyperlipidemia, unspecified: Secondary | ICD-10-CM

## 2021-04-03 MED ORDER — VENLAFAXINE HCL ER 150 MG PO CP24: 150.0000 mg | ORAL_CAPSULE | Freq: Every day | ORAL | 1 refills | Status: DC

## 2021-04-03 MED ORDER — ALLOPURINOL 100 MG PO TABS: 100.0000 mg | ORAL_TABLET | Freq: Two times a day (BID) | ORAL | 1 refills | Status: DC

## 2021-04-03 MED ORDER — BUPROPION HCL ER (XL) 150 MG PO TB24: ORAL_TABLET | ORAL | 1 refills | Status: DC

## 2021-04-03 MED ORDER — ATORVASTATIN CALCIUM 40 MG PO TABS: 40.0000 mg | ORAL_TABLET | Freq: Every day | ORAL | 1 refills | Status: DC

## 2021-04-03 MED ORDER — LOSARTAN POTASSIUM 100 MG PO TABS: 100.0000 mg | ORAL_TABLET | Freq: Every day | ORAL | 1 refills | Status: DC

## 2021-04-04 LAB — COMPLETE METABOLIC PANEL WITH GFR
AG Ratio: 1.6 (calc) (ref 1.0–2.5)
ALT: 24 U/L (ref 6–29)
AST: 29 U/L (ref 10–35)
Albumin: 4.2 g/dL (ref 3.6–5.1)
Alkaline phosphatase (APISO): 53 U/L (ref 37–153)
BUN: 14 mg/dL (ref 7–25)
CO2: 24 mmol/L (ref 20–32)
Calcium: 9.5 mg/dL (ref 8.6–10.4)
Chloride: 108 mmol/L (ref 98–110)
Creat: 0.57 mg/dL (ref 0.50–1.05)
Globulin: 2.6 g/dL (calc) (ref 1.9–3.7)
Glucose, Bld: 133 mg/dL — ABNORMAL HIGH (ref 65–99)
Potassium: 4.5 mmol/L (ref 3.5–5.3)
Sodium: 144 mmol/L (ref 135–146)
Total Bilirubin: 0.8 mg/dL (ref 0.2–1.2)
Total Protein: 6.8 g/dL (ref 6.1–8.1)
eGFR: 100 mL/min/{1.73_m2} (ref >=60)

## 2021-04-04 LAB — CBC WITH DIFFERENTIAL/PLATELET
Absolute Monocytes: 443 cells/uL (ref 200–950)
Basophils Absolute: 30 cells/uL (ref 0–200)
Basophils Relative: 0.5 %
Eosinophils Absolute: 83 cells/uL (ref 15–500)
Eosinophils Relative: 1.4 %
HCT: 40.1 % (ref 35.0–45.0)
Hemoglobin: 13.5 g/dL (ref 11.7–15.5)
Lymphs Abs: 1959 cells/uL (ref 850–3900)
MCH: 31.8 pg (ref 27.0–33.0)
MCHC: 33.7 g/dL (ref 32.0–36.0)
MCV: 94.6 fL (ref 80.0–100.0)
MPV: 9.8 fL (ref 7.5–12.5)
Monocytes Relative: 7.5 %
Neutro Abs: 3387 cells/uL (ref 1500–7800)
Neutrophils Relative %: 57.4 %
Platelets: 171 10*3/uL (ref 140–400)
RBC: 4.24 10*6/uL (ref 3.80–5.10)
RDW: 13.7 % (ref 11.0–15.0)
Total Lymphocyte: 33.2 %
WBC: 5.9 10*3/uL (ref 3.8–10.8)

## 2021-04-04 LAB — HEMOGLOBIN A1C
Hgb A1c MFr Bld: 5.8 % of total Hgb — ABNORMAL HIGH (ref <5.7)
Mean Plasma Glucose: 120 mg/dL
eAG (mmol/L): 6.6 mmol/L

## 2021-04-04 LAB — MICROALBUMIN / CREATININE URINE RATIO
Creatinine, Urine: 247 mg/dL (ref 20–275)
Microalb Creat Ratio: 11 mcg/mg creat (ref <30)
Microalb, Ur: 2.6 mg/dL

## 2021-04-04 LAB — LIPID PANEL
Cholesterol: 150 mg/dL (ref <200)
HDL: 45 mg/dL — ABNORMAL LOW (ref >=50)
LDL Cholesterol (Calc): 75 mg/dL (calc)
Non-HDL Cholesterol (Calc): 105 mg/dL (calc) (ref <130)
Total CHOL/HDL Ratio: 3.3 (calc) (ref <5.0)
Triglycerides: 204 mg/dL — ABNORMAL HIGH (ref <150)

## 2021-04-11 ENCOUNTER — Other Ambulatory Visit: Payer: Self-pay | Admitting: Family Medicine

## 2021-04-11 DIAGNOSIS — F339 Major depressive disorder, recurrent, unspecified: Secondary | ICD-10-CM

## 2021-05-03 IMAGING — DX DG TIBIA/FIBULA 2V*L*
5 series · 5 of 5 positions shown · non-contrast
Comparison: None

CLINICAL DATA: Cellulitis.

EXAM:
LEFT TIBIA AND FIBULA - 2 VIEW

[tibia ap (1 of 2)]
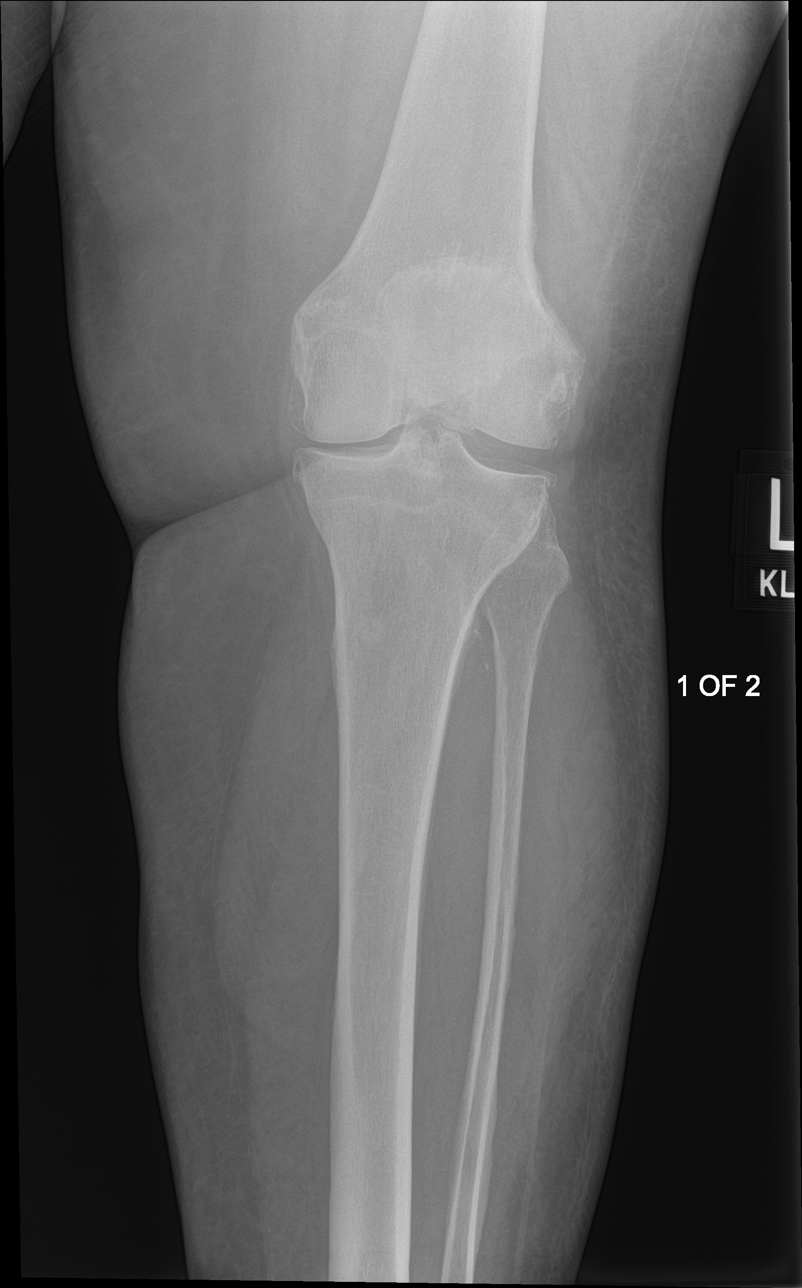

[tibia ap (2 of 2)]
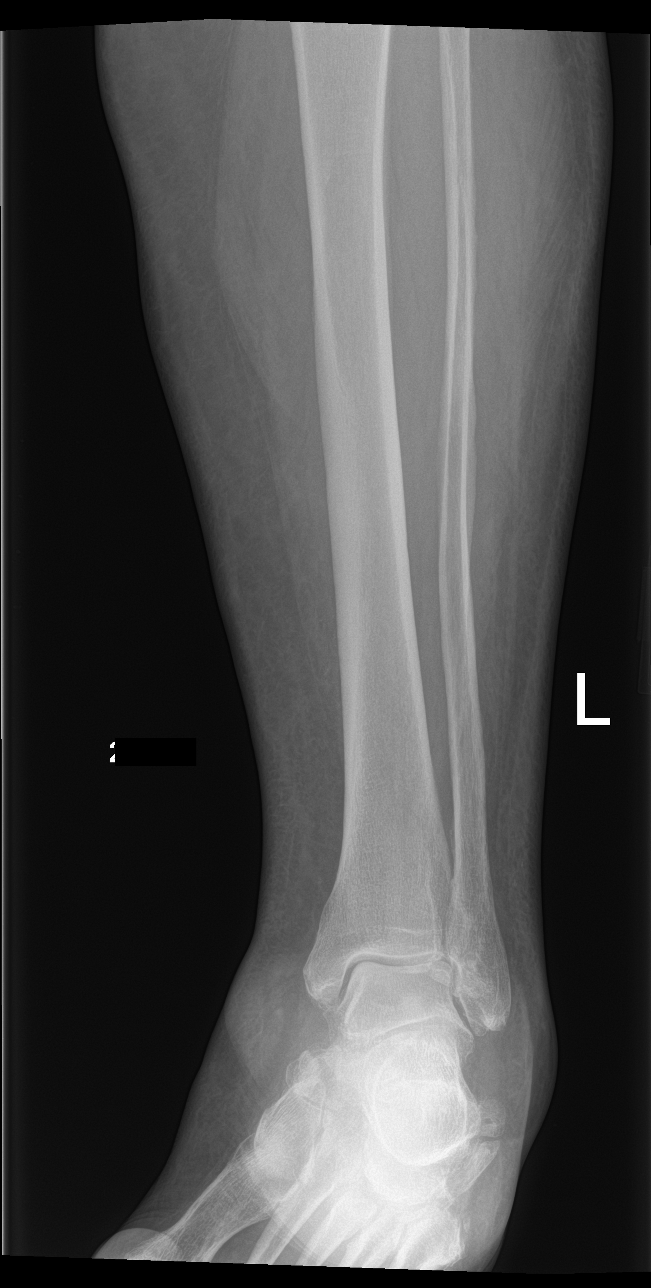

[tibia lat (1 of 3)]
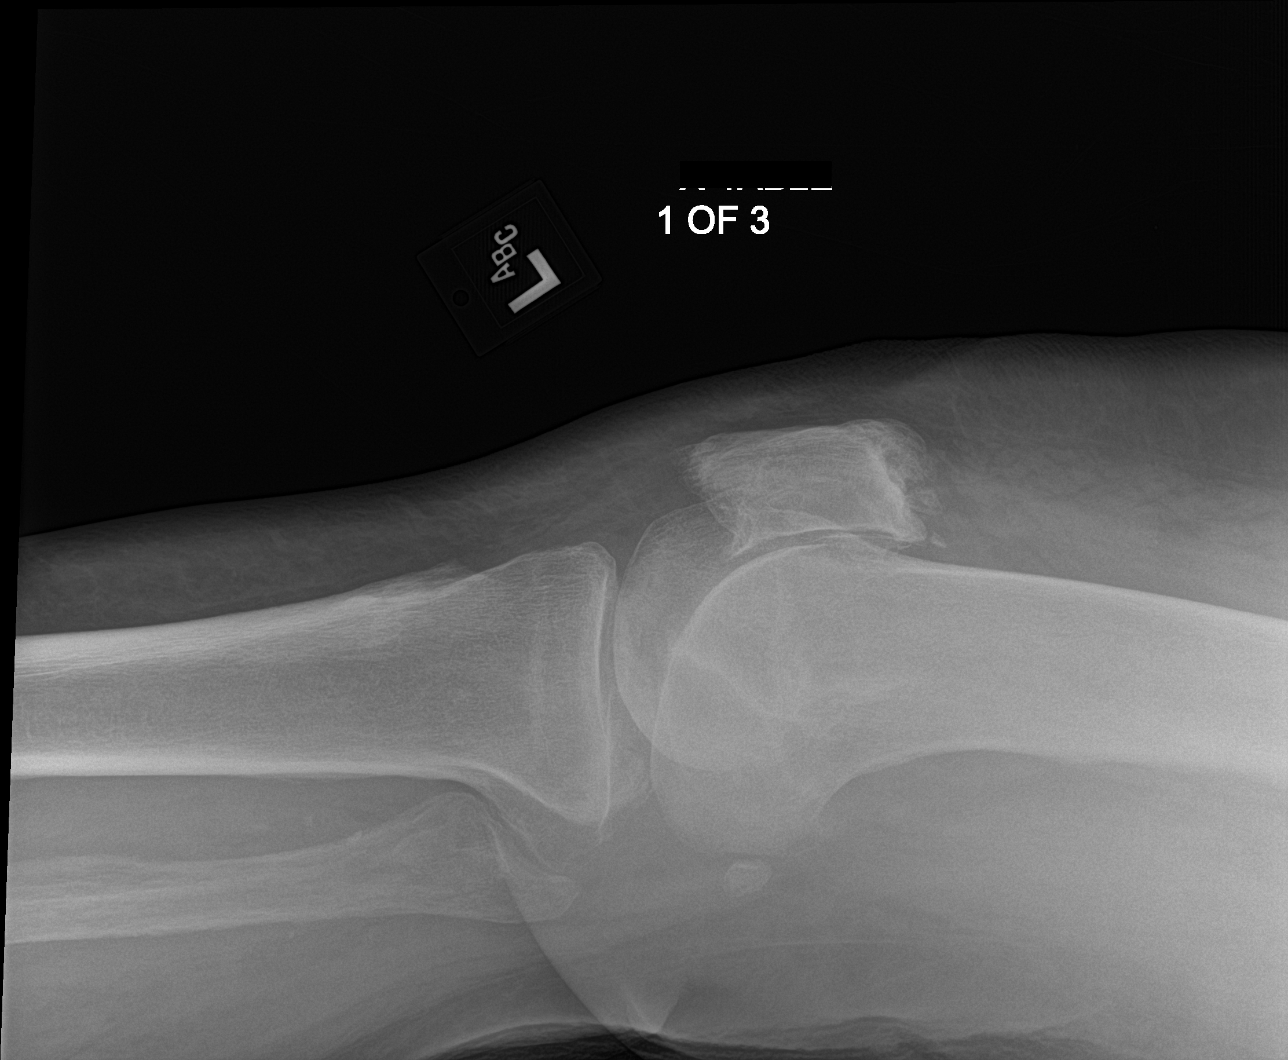

[tibia lat (2 of 3)]
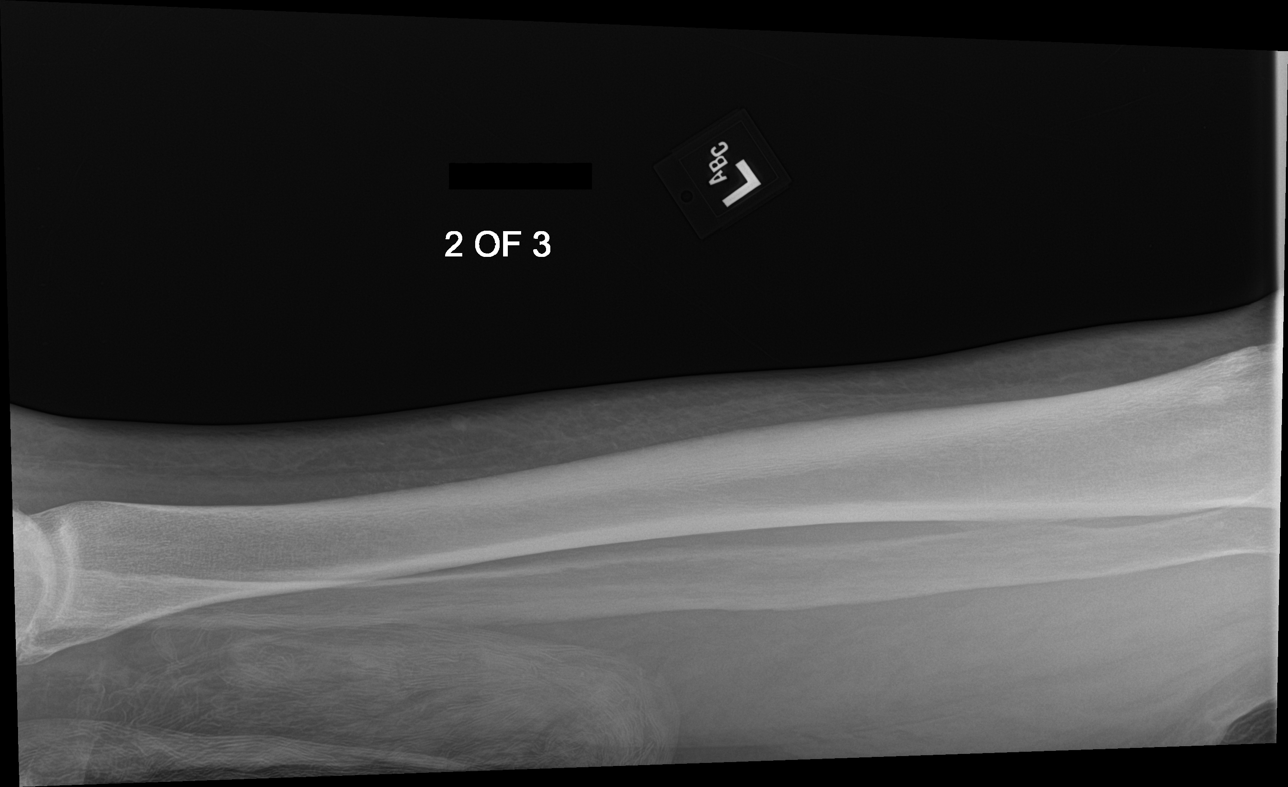

[tibia lat (3 of 3)]
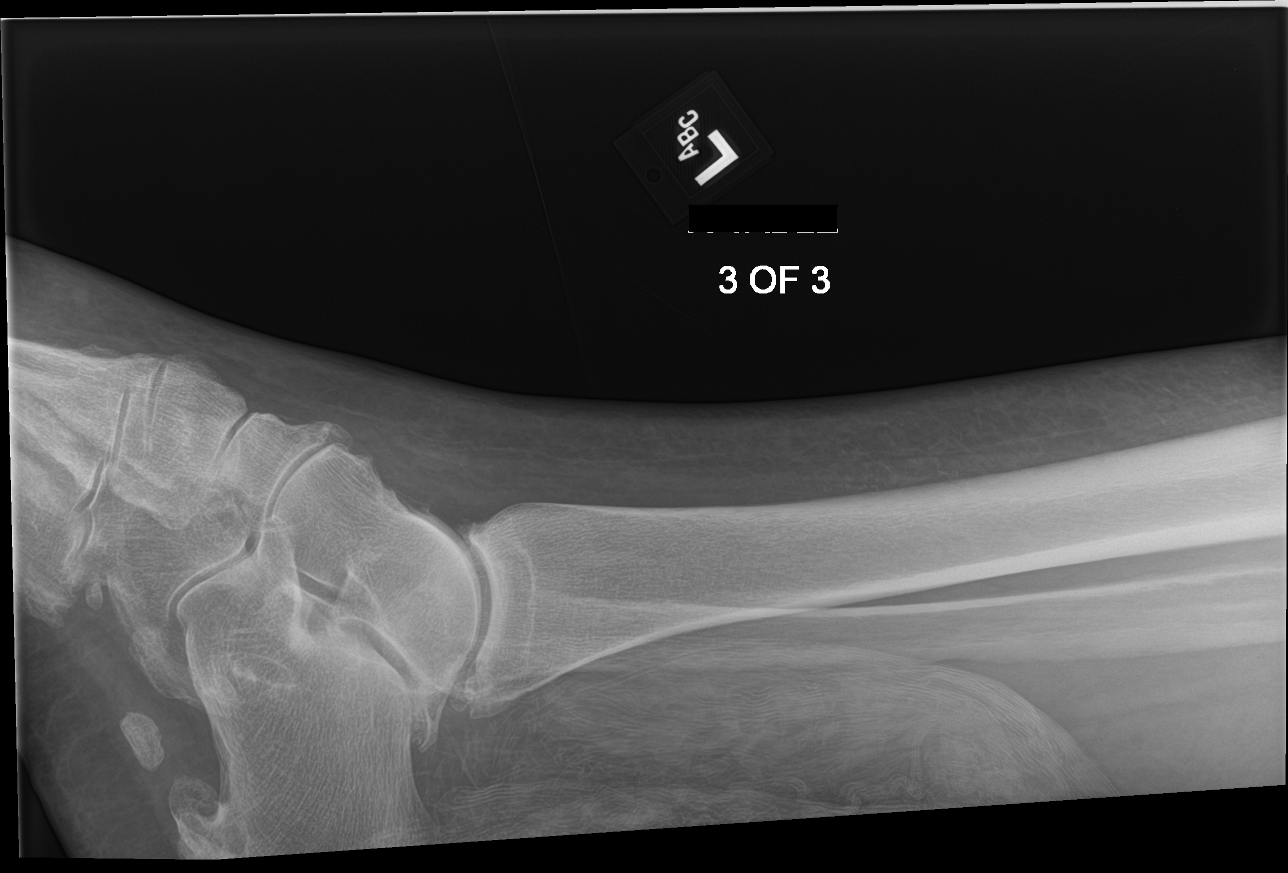

[5 of 5 positions shown; findings below may reference images not displayed]

FINDINGS: Moderate to advanced degenerative changes are noted within the left
knee. No acute fracture or dislocation identified. Mild diffuse soft
tissue swelling.
IMPRESSION: 1. No acute bone abnormality.
2. Soft tissue swelling
3. Left knee osteoarthritis.

## 2021-05-03 IMAGING — DX DG TOE GREAT 2+V*L*
3 series · 3 of 3 positions shown · non-contrast
Comparison: None.

CLINICAL DATA: Cellulitis.  Status post treatment with antibiotics.

EXAM:
LEFT GREAT TOE

[toe ap]
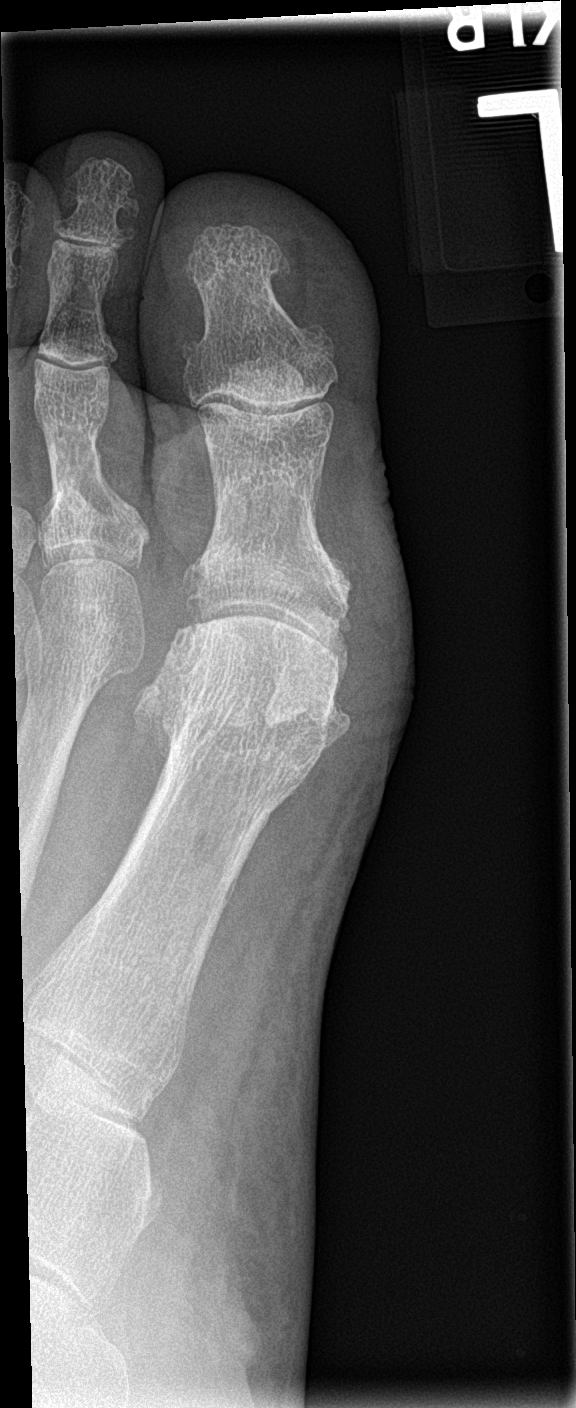

[toe obl]
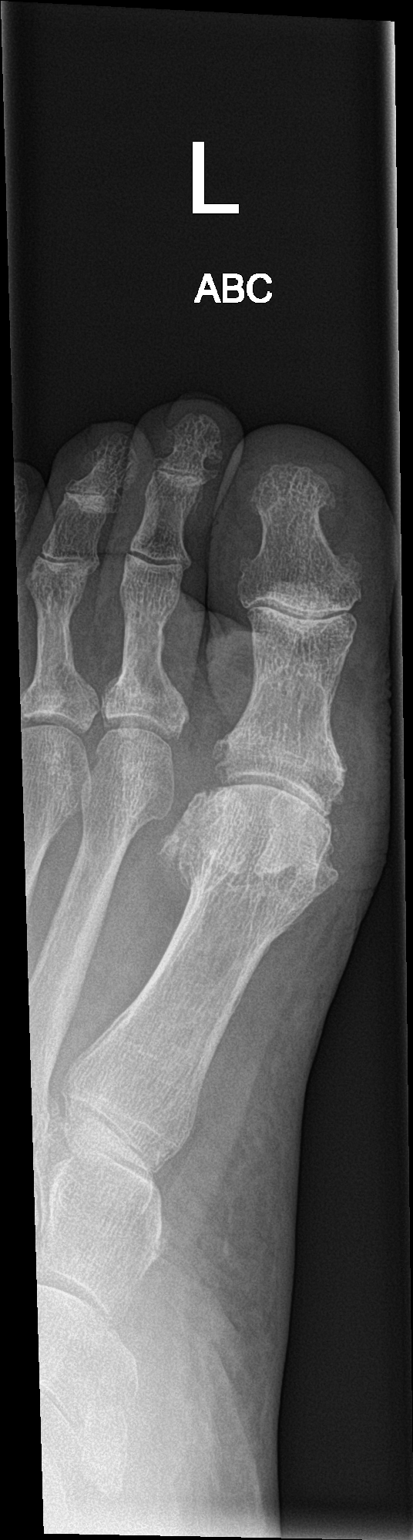

[toe lat]
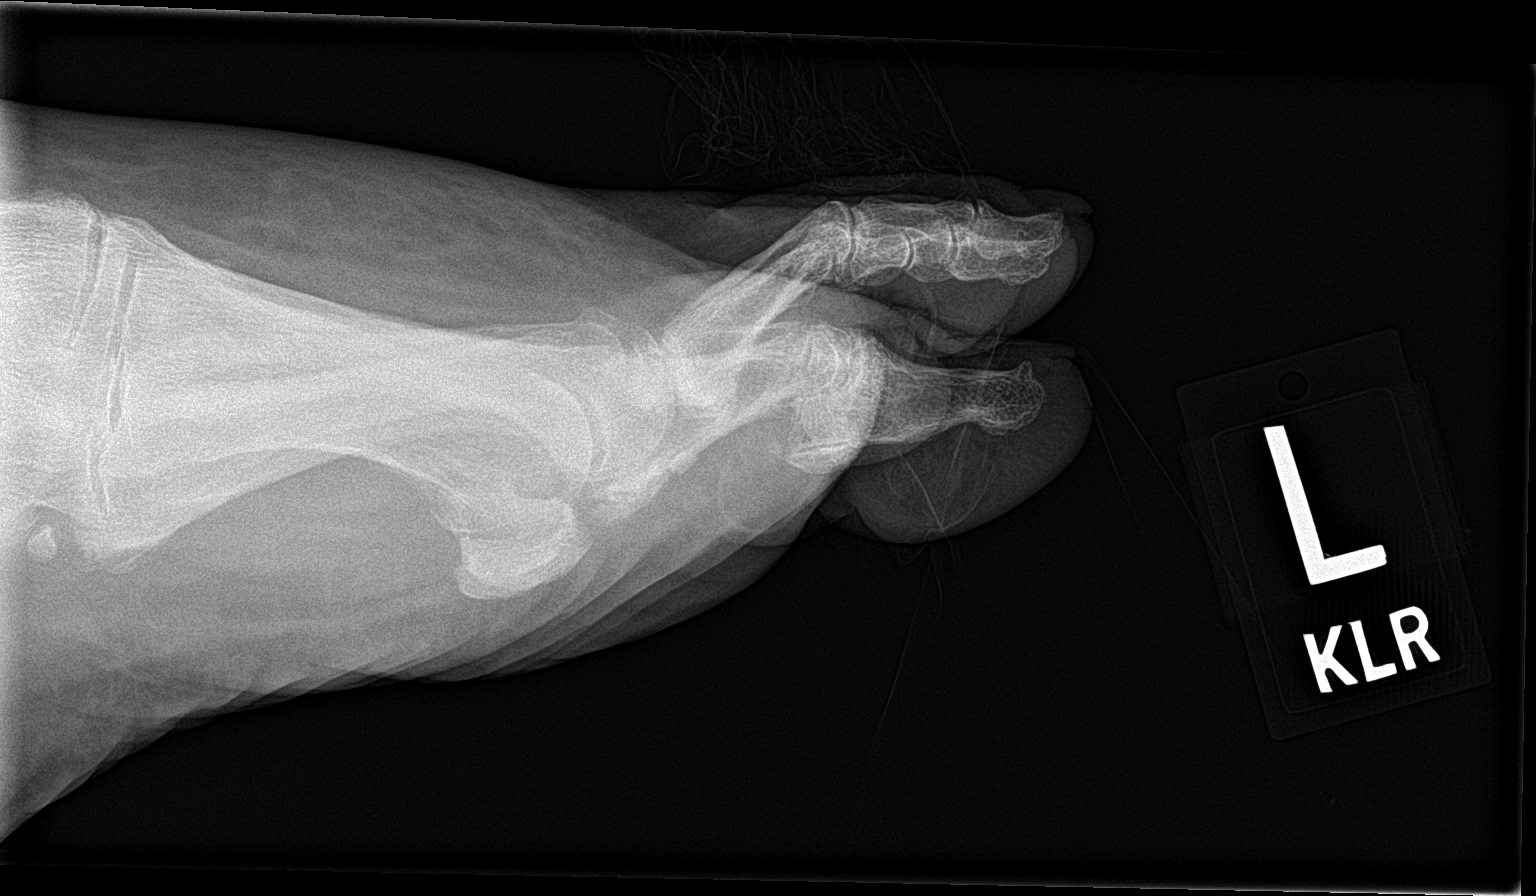

[3 of 3 positions shown; findings below may reference images not displayed]

FINDINGS: No acute fracture or dislocation identified no focal bone erosions.
Degenerative changes at the first MTP joint. Mild diffuse soft
tissue swelling.
IMPRESSION: 1. Soft tissue swelling.
2. No acute bone abnormality.

## 2021-06-18 ENCOUNTER — Telehealth: Payer: Self-pay

## 2021-06-18 NOTE — Telephone Encounter (Signed)
We will start working on patient assistance for her Ozempic. Thanks ? ?Doristine Section, BCACP, [name] ?Clinical Pharmacist Practitioner  ?[facility] ?[phone] ? ? ?

## 2021-06-18 NOTE — Telephone Encounter (Signed)
Copied from [place] [phone]. Topic: General - Other ?>> Jun 17, 2021  4:58 PM Pawlus, Brayton Layman A wrote: ?Reason for CRM: Pt called in stating she is on a program where she picks up tsamples from the office, pt did not have the details but stated she normally gets a call to come pick up Semaglutide,0.25 or 0.'5MG'$ /DOS, (OZEMPIC, 0.25 OR 0.5 MG/DOSE,) 2 MG/1.5ML SOPN, please advise. ?

## 2021-06-21 ENCOUNTER — Telehealth: Payer: Self-pay

## 2021-06-21 NOTE — Telephone Encounter (Signed)
Copied from [place] [phone]. Topic: General - Other ?>> Jun 21, 2021  2:47 PM Tessa Lerner A wrote: ?Reason for CRM: The patient has made an additional call to check on their status of enrollment in the patient assistance program for coverage of their ozempic prescription ? ?Please contact further when available ?

## 2021-06-26 ENCOUNTER — Telehealth: Payer: Self-pay

## 2021-06-26 NOTE — Progress Notes (Signed)
    Chronic Care Management Pharmacy Assistant   Name: Tina Short  MRN: [id number] DOB: 1955-07-20  Patient Assistance Application for Ozempic  Application started for this patient for her Ozempic. Patient is not apart of the CCM program, but [name], [name] requested that I assist in starting her application so the patient can receive her medication through [employer] Patient Assistance Program. . Application started and e-mailed to [name] to mail to patient.  Medications: Outpatient Encounter Medications as of 06/26/2021  Medication Sig   allopurinol (ZYLOPRIM) 100 MG tablet Take 1 tablet (100 mg total) by mouth 2 (two) times daily.   atorvastatin (LIPITOR) 40 MG tablet Take 1 tablet (40 mg total) by mouth at bedtime.   blood glucose meter kit and supplies Dispense based on patient and insurance preference. Use up to four times daily as directed. (FOR ICD-10 E10.9, E11.9).   buPROPion (WELLBUTRIN XL) 150 MG 24 hr tablet TAKE 1 TABLET(150 MG) BY MOUTH DAILY WITH BREAKFAST   Cholecalciferol (VITAMIN D) 50 MCG (2000 UT) CAPS Take 1 capsule by mouth daily at 12 noon.   fenofibrate (TRICOR) 145 MG tablet TAKE 1 TABLET(145 MG) BY MOUTH DAILY   losartan (COZAAR) 100 MG tablet Take 1 tablet (100 mg total) by mouth daily.   metFORMIN (GLUCOPHAGE-XR) 750 MG 24 hr tablet TAKE 1 TABLET(750 MG) BY MOUTH TWICE DAILY   ONETOUCH ULTRA test strip TEST UP TO FOUR TIMES A DAY AS DIRECTED   QUEtiapine (SEROQUEL) 25 MG tablet TAKE 1 TABLET(25 MG) BY MOUTH AT BEDTIME   Semaglutide,0.25 or 0.5MG /DOS, (OZEMPIC, 0.25 OR 0.5 MG/DOSE,) 2 MG/1.5ML SOPN Inject 0.25 mg into the skin once a week. Through [employer] PAP   venlafaxine XR (EFFEXOR-XR) 150 MG 24 hr capsule Take 1 capsule (150 mg total) by mouth daily with breakfast.   vitamin B-12 (CYANOCOBALAMIN) 500 MCG tablet Take 500 mcg by mouth daily. (Patient not taking: Reported on 04/03/2021)   No facility-administered encounter medications on file as of  06/26/2021.    Lynann Bologna, CPA/CMA Clinical Pharmacist Assistant Phone: [phone]

## 2021-07-02 ENCOUNTER — Telehealth: Payer: Self-pay | Admitting: Family Medicine

## 2021-07-02 NOTE — Telephone Encounter (Unsigned)
Copied from [place] [phone]. Topic: General - Other >> Jul 02, 2021  3:59 PM Tessa Lerner A wrote: Reason for CRM: The patient is filling out an application for assistance with coverage of their Semaglutide,0.25 or 0.'5MG'$ /DOS, (OZEMPIC, 0.25 OR 0.5 MG/DOSE,) 2 MG/1.5ML SOPN [[id number]]  The patient has noticed that the instructions for the medication have been directed them to inject 0.25 and the patient shares that they've been injecting 0.5 for more than a year   The patient [employer] Patient Assistance program requires that application match previous prescription and directions of prescriptions on file   The patient wold like to be contacted to discuss further when available   The patient shares that they've experienced no complications from the medication and feel no side effects

## 2021-07-05 NOTE — Telephone Encounter (Signed)
Pt made an additional call regarding the form she needs to complete.

## 2021-07-09 NOTE — Telephone Encounter (Signed)
PT called back checking on status of forms she is trying to fill out for her Ozempic 0.5 She states the form she received said 0.25, but it should be 0.5. Please call back.

## 2021-07-10 NOTE — Telephone Encounter (Signed)
Forms updated for correct dosage and faxed yesterday. Pt aware

## 2021-07-11 ENCOUNTER — Ambulatory Visit (INDEPENDENT_AMBULATORY_CARE_PROVIDER_SITE_OTHER): Payer: PPO

## 2021-07-11 DIAGNOSIS — Z0000 Encounter for general adult medical examination without abnormal findings: Secondary | ICD-10-CM | POA: Diagnosis not present

## 2021-07-11 DIAGNOSIS — Z780 Asymptomatic menopausal state: Secondary | ICD-10-CM
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
## 2021-07-11 NOTE — Patient Instructions (Signed)
Tina Short , Thank you for taking time to come for your Medicare Wellness Visit. I appreciate your ongoing commitment to your health goals. Please review the following plan we discussed and let me know if I can assist you in the future.   Screening recommendations/referrals: Colonoscopy: done 04/15/18. Repeat 04/2023 Mammogram: done 05/27/16. Please call [phone] to schedule your mammogram and bone density screening Bone Density: done 07/13/13 Recommended yearly ophthalmology/optometry visit for glaucoma screening and checkup Recommended yearly dental visit for hygiene and checkup  Vaccinations: Influenza vaccine: done 11/26/20 Pneumococcal vaccine: done 10/28/18 Tdap vaccine: done 01/29/18 Shingles vaccine: Shingrix discussed. Please contact your pharmacy for coverage information.  Covid-19:due  Advanced directives: Please bring a copy of your health care power of attorney and living will to the office at your convenience once you have completed that paperwork  Conditions/risks identified: Recommend increasing physical activity   Next appointment: Follow up in one year for your annual wellness visit    Preventive Care 65 Years and Older, Female Preventive care refers to lifestyle choices and visits with your health care provider that can promote health and wellness. What does preventive care include? A yearly physical exam. This is also called an annual well check. Dental exams once or twice a year. Routine eye exams. Ask your health care provider how often you should have your eyes checked. Personal lifestyle choices, including: Daily care of your teeth and gums. Regular physical activity. Eating a healthy diet. Avoiding tobacco and drug use. Limiting alcohol use. Practicing safe sex. Taking low-dose aspirin every day. Taking vitamin and mineral supplements as recommended by your health care provider. What happens during an annual well check? The services and screenings done  by your health care provider during your annual well check will depend on your age, overall health, lifestyle risk factors, and family history of disease. Counseling  Your health care provider may ask you questions about your: Alcohol use. Tobacco use. Drug use. Emotional well-being. Home and relationship well-being. Sexual activity. Eating habits. History of falls. Memory and ability to understand (cognition). Work and work Statistician. Reproductive health. Screening  You may have the following tests or measurements: Height, weight, and BMI. Blood pressure. Lipid and cholesterol levels. These may be checked every 5 years, or more frequently if you are over 32 years old. Skin check. Lung cancer screening. You may have this screening every year starting at age 6 if you have a 30-pack-year history of smoking and currently smoke or have quit within the past 15 years. Fecal occult blood test (FOBT) of the stool. You may have this test every year starting at age 36. Flexible sigmoidoscopy or colonoscopy. You may have a sigmoidoscopy every 5 years or a colonoscopy every 10 years starting at age 77. Hepatitis C blood test. Hepatitis B blood test. Sexually transmitted disease (STD) testing. Diabetes screening. This is done by checking your blood sugar (glucose) after you have not eaten for a while (fasting). You may have this done every 1-3 years. Bone density scan. This is done to screen for osteoporosis. You may have this done starting at age 89. Mammogram. This may be done every 1-2 years. Talk to your health care provider about how often you should have regular mammograms. Talk with your health care provider about your test results, treatment options, and if necessary, the need for more tests. Vaccines  Your health care provider may recommend certain vaccines, such as: Influenza vaccine. This is recommended every year. Tetanus, diphtheria, and acellular pertussis (Tdap,  Td) vaccine. You  may need a Td booster every 10 years. Zoster vaccine. You may need this after age 82. Pneumococcal 13-valent conjugate (PCV13) vaccine. One dose is recommended after age 53. Pneumococcal polysaccharide (PPSV23) vaccine. One dose is recommended after age 13. Talk to your health care provider about which screenings and vaccines you need and how often you need them. This information is not intended to replace advice given to you by your health care provider. Make sure you discuss any questions you have with your health care provider. Document Released: 02/23/2015 Document Revised: 10/17/2015 Document Reviewed: 11/28/2014 Elsevier Interactive Patient Education  2017 [employer] Prevention in the Home Falls can cause injuries. They can happen to people of all ages. There are many things you can do to make your home safe and to help prevent falls. What can I do on the outside of my home? Regularly fix the edges of walkways and driveways and fix any cracks. Remove anything that might make you trip as you walk through a door, such as a raised step or threshold. Trim any bushes or trees on the path to your home. Use bright outdoor lighting. Clear any walking paths of anything that might make someone trip, such as rocks or tools. Regularly check to see if handrails are loose or broken. Make sure that both sides of any steps have handrails. Any raised decks and porches should have guardrails on the edges. Have any leaves, snow, or ice cleared regularly. Use sand or salt on walking paths during winter. Clean up any spills in your garage right away. This includes oil or grease spills. What can I do in the bathroom? Use night lights. Install grab bars by the toilet and in the tub and shower. Do not use towel bars as grab bars. Use non-skid mats or decals in the tub or shower. If you need to sit down in the shower, use a plastic, non-slip stool. Keep the floor dry. Clean up any water that spills  on the floor as soon as it happens. Remove soap buildup in the tub or shower regularly. Attach bath mats securely with double-sided non-slip rug tape. Do not have throw rugs and other things on the floor that can make you trip. What can I do in the bedroom? Use night lights. Make sure that you have a light by your bed that is easy to reach. Do not use any sheets or blankets that are too big for your bed. They should not hang down onto the floor. Have a firm chair that has side arms. You can use this for support while you get dressed. Do not have throw rugs and other things on the floor that can make you trip. What can I do in the kitchen? Clean up any spills right away. Avoid walking on wet floors. Keep items that you use a lot in easy-to-reach places. If you need to reach something above you, use a strong step stool that has a grab bar. Keep electrical cords out of the way. Do not use floor polish or wax that makes floors slippery. If you must use wax, use non-skid floor wax. Do not have throw rugs and other things on the floor that can make you trip. What can I do with my stairs? Do not leave any items on the stairs. Make sure that there are handrails on both sides of the stairs and use them. Fix handrails that are broken or loose. Make sure that handrails are as  long as the stairways. Check any carpeting to make sure that it is firmly attached to the stairs. Fix any carpet that is loose or worn. Avoid having throw rugs at the top or bottom of the stairs. If you do have throw rugs, attach them to the floor with carpet tape. Make sure that you have a light switch at the top of the stairs and the bottom of the stairs. If you do not have them, ask someone to add them for you. What else can I do to help prevent falls? Wear shoes that: Do not have high heels. Have rubber bottoms. Are comfortable and fit you well. Are closed at the toe. Do not wear sandals. If you use a stepladder: Make  sure that it is fully opened. Do not climb a closed stepladder. Make sure that both sides of the stepladder are locked into place. Ask someone to hold it for you, if possible. Clearly mark and make sure that you can see: Any grab bars or handrails. First and last steps. Where the edge of each step is. Use tools that help you move around (mobility aids) if they are needed. These include: Canes. Walkers. Scooters. Crutches. Turn on the lights when you go into a dark area. Replace any light bulbs as soon as they burn out. Set up your furniture so you have a clear path. Avoid moving your furniture around. If any of your floors are uneven, fix them. If there are any pets around you, be aware of where they are. Review your medicines with your doctor. Some medicines can make you feel dizzy. This can increase your chance of falling. Ask your doctor what other things that you can do to help prevent falls. This information is not intended to replace advice given to you by your health care provider. Make sure you discuss any questions you have with your health care provider. Document Released: 11/23/2008 Document Revised: 07/05/2015 Document Reviewed: 03/03/2014 Elsevier Interactive Patient Education  2017 [employer].

## 2021-07-17 ENCOUNTER — Telehealth: Payer: Self-pay | Admitting: Family Medicine

## 2021-07-17 ENCOUNTER — Other Ambulatory Visit: Payer: Self-pay | Admitting: Family Medicine

## 2021-07-17 DIAGNOSIS — F339 Major depressive disorder, recurrent, unspecified: Secondary | ICD-10-CM

## 2021-07-17 NOTE — Telephone Encounter (Signed)
Patient asking about the status of the pre -authorization for her medication

## 2021-07-17 NOTE — Telephone Encounter (Signed)
Called pt and verified it is not a prior authorization. It is the patient assistant program for her Ozempic. It was faxed 07/10/21 with correct dosage and information. Pt aware and gave verbal understanding.

## 2021-07-17 NOTE — Telephone Encounter (Signed)
Copied from [place] [id number]. Topic: Medicare AWV >> Jul 17, 2021 12:35 PM Cher Nakai R wrote: Reason for CRM:  Left message for patient to call back and schedule Medicare Annual Wellness Visit (AWV) in office.   If unable to come into the office for AWV,  please offer to do virtually or by telephone.  Last AWV: 07/10/2020  Please schedule at anytime with [place].  30 minute appointment for Virtual or phone 45 minute appointment for in office or Initial virtual/phone  Any questions, please contact me at [phone]

## 2021-07-26 NOTE — Telephone Encounter (Signed)
Patient states she received a denial letter from [employer] stating her enrollment has ended and she need to reapply  Patient inquiring additional information on why the medication was denied and what's the next steps to get the medication cost covered  Inquired if patient has called [employer], she stated she has not  Please follow up w/ patient

## 2021-07-29 NOTE — Telephone Encounter (Signed)
Pt will bring proof of income

## 2021-07-29 NOTE — Telephone Encounter (Signed)
We will call and see if there is something wrong with her application.   Tina Short, [place] Pharmacist Practitioner  [facility] [phone]

## 2021-07-29 NOTE — Telephone Encounter (Signed)
CCM team spoke with [employer]. Patient is missing proof of income for 2022 and copy of patient's insurance card. Can you please contact the patient and ask her to bring in her proof of income to the clinic?   Malva Limes, [place] Pharmacist Practitioner  [facility] [phone]

## 2021-07-31 ENCOUNTER — Other Ambulatory Visit: Payer: Self-pay | Admitting: Family Medicine

## 2021-07-31 NOTE — Telephone Encounter (Signed)
Proof of income and insurance cards faxed into [employer].

## 2021-08-05 NOTE — Progress Notes (Signed)
Name: Tina Short   MRN: [id number]    DOB: March 12, 1955   Date:08/06/2021       Progress Note  Subjective  Chief Complaint  Follow Up  HPI  DMII: she has a history of DM for many years, A1C was 6.8% it went up to 8 %  after that down to 6.8% today it is down to 5.3 % , she is off glipizide, continue Metformin 1500 mg and out of Ozempic 0.5 mg weekly - I am reaching out to [employer] to make sure it gest re-ordered through [facility] assistance program. She has not been checkign glucose lately.   She denies  polydipsia, no  polyuria but since out of Ozempic her notice polyphagia and has gained weight. . She takes statin, off Lovaza ( due to size and bad taste)  and is back on fenofibrate , Losartan ( ARB) for microabuminuria She has neuropathy, she denies pain just has numbness and seems to be doing better. She cannot tolerate SGL-2 agonist because it caused yeast infection .   Morbid obesity: she was doing well on Ozempic, it was curbing her appetite, last visit weight was down to 252 lbs but since out of medication, today is up to 266 lbs and she is frustrated   Major Depression: Her father committed suicide in his 8's and there are other family members with history of psychiatric illness. She denies suicidal thoughts, but would be fine if she died, she states that is a chronic feeling, she states she would never do it. No planning. She still feels hurt about the break down of her previous church - where she was a member for almost 50 years. Not doing well since Spring 2022 , we added Wellbutrin and also seroquel for sleep , she is doing better, able to sleep at night, mood has improved . She states she does not like the heat and since pandemic prefers staying at home   OSA: she uses CPAP every night otherwise she wakes up feeling shaky.    B12 deficiency: she needs to resume supplementation    Gout: no recent episodes, she is now on Allopurinol twice daily and denies side effects of medication    Vitamin D: she needs to go to pharmacy to get more    HTN: she is taking medications, no chest pain, palpitation or dizziness . BP today is under control, continue medication  OA knee: worse on right side, daily pain, worse with activity or standing, she was seen at [facility] , had left knee replacement 10/03/2020 , she want to hold off on right knee replacement at this time. Unchanged    DDD cervical spine: per patient neurosurgeon thinks the cause of her choking and coughing, but no need for intervention at this time. Unchanged   Patient Active Problem List   Diagnosis Date Noted   History of total knee replacement, left 10/16/2020   Candidiasis of skin 08/28/2020   Esophageal dysphagia    Colon cancer screening 04/30/2016   Major depression, recurrent, chronic (HCC) 10/30/2015   Dyslipidemia associated with type 2 diabetes mellitus (HCC) 10/30/2015   Vitamin D deficiency 07/27/2015   Vitamin B12 deficiency 07/27/2015   History of anemia 09/12/2014   Carpal tunnel syndrome 09/12/2014   Obstructive sleep apnea 09/12/2014   Edema of extremities 09/12/2014   Gout 09/12/2014   Morbid obesity (HCC) 11/15/2009   Benign essential HTN 10/07/2006   Type 2 diabetes mellitus, uncontrolled, with neuropathy 10/07/2006   Hyperlipidemia 10/07/2006  Past Surgical History:  Procedure Laterality Date   ABDOMINAL HYSTERECTOMY     COLONOSCOPY     COLONOSCOPY WITH PROPOFOL N/A 05/14/2016   Procedure: COLONOSCOPY WITH PROPOFOL;  Surgeon: Earline Mayotte, MD;  Location: [facility] ENDOSCOPY;  Service: Endoscopy;  Laterality: N/A;   COLONOSCOPY WITH PROPOFOL N/A 04/15/2018   Procedure: COLONOSCOPY WITH PROPOFOL;  Surgeon: Toney Reil, MD;  Location[facility] ENDOSCOPY;  Service: Gastroenterology;  Laterality: N/A;   ESOPHAGOGASTRODUODENOSCOPY (EGD) WITH PROPOFOL N/A 04/15/2018   Procedure: ESOPHAGOGASTRODUODENOSCOPY (EGD) WITH PROPOFOL;  Surgeon: Toney Reil, MD;  Location: [facility]  ENDOSCOPY;  Service: Gastroenterology;  Laterality: N/A;   FOOT SURGERY Right    X2   SHOULDER SURGERY Right    TOTAL KNEE ARTHROPLASTY Left    Dr. Odis Luster at [facility]   WRIST SURGERY Bilateral     Family History  Problem Relation Age of Onset   Diabetes Mother    Cancer Mother        uterine   Mental illness Father    Obesity Daughter    Diabetes Maternal Grandmother    Heart disease Brother    Obesity Daughter     Social History   Tobacco Use   Smoking status: Never   Smokeless tobacco: Never  Substance Use Topics   Alcohol use: No    Alcohol/week: 0.0 standard drinks of alcohol     Current Outpatient Medications:    allopurinol (ZYLOPRIM) 100 MG tablet, Take 1 tablet (100 mg total) by mouth 2 (two) times daily., Disp: 180 tablet, Rfl: 1   atorvastatin (LIPITOR) 40 MG tablet, Take 1 tablet (40 mg total) by mouth at bedtime., Disp: 90 tablet, Rfl: 1   blood glucose meter kit and supplies, Dispense based on patient and insurance preference. Use up to four times daily as directed. (FOR ICD-10 E10.9, E11.9)., Disp: 1 each, Rfl: 0   buPROPion (WELLBUTRIN XL) 150 MG 24 hr tablet, TAKE 1 TABLET(150 MG) BY MOUTH DAILY WITH BREAKFAST, Disp: 90 tablet, Rfl: 1   fenofibrate (TRICOR) 145 MG tablet, TAKE 1 TABLET(145 MG) BY MOUTH DAILY, Disp: 90 tablet, Rfl: 1   losartan (COZAAR) 100 MG tablet, Take 1 tablet (100 mg total) by mouth daily., Disp: 90 tablet, Rfl: 1   metFORMIN (GLUCOPHAGE-XR) 750 MG 24 hr tablet, TAKE 1 TABLET(750 MG) BY MOUTH TWICE DAILY, Disp: 180 tablet, Rfl: 0   ONETOUCH ULTRA test strip, TEST UP TO FOUR TIMES A DAY AS DIRECTED, Disp: 300 strip, Rfl: 0   QUEtiapine (SEROQUEL) 25 MG tablet, TAKE 1 TABLET(25 MG) BY MOUTH AT BEDTIME, Disp: 90 tablet, Rfl: 0   Semaglutide,0.25 or 0.5MG /DOS, (OZEMPIC, 0.25 OR 0.5 MG/DOSE,) 2 MG/1.5ML SOPN, Inject 0.25 mg into the skin once a week. Through [employer] PAP, Disp: 1.5 mL, Rfl:    venlafaxine XR (EFFEXOR-XR) 150 MG 24  hr capsule, Take 1 capsule (150 mg total) by mouth daily with breakfast., Disp: 90 capsule, Rfl: 1   Cholecalciferol (VITAMIN D) 50 MCG (2000 UT) CAPS, Take 1 capsule by mouth daily at 12 noon. (Patient not taking: Reported on 08/06/2021), Disp: , Rfl:    vitamin B-12 (CYANOCOBALAMIN) 500 MCG tablet, Take 500 mcg by mouth daily. (Patient not taking: Reported on 08/06/2021), Disp: , Rfl:   Allergies  Allergen Reactions   Abilify [Aripiprazole] Shortness Of Breath    Other reaction(s): Difficulty breathing   Invokana [Canagliflozin] Itching    Yeast infections   Metformin And Related     Diarrhea, but doing  well with XR formulation     Lovaza [Omega-3-Acid Ethyl Esters] Other (See Comments)    Burping     I personally reviewed active problem list, medication list, allergies, family history, social history, health maintenance with the patient/caregiver today.   ROS  Constitutional: Negative for fever , positive for weight change.  Respiratory: Negative for cough and shortness of breath.   Cardiovascular: Negative for chest pain or palpitations.  Gastrointestinal: Negative for abdominal pain, no bowel changes.  Musculoskeletal: positive for gait problem and knee joint swelling.  Skin: Negative for rash.  Neurological: Negative for dizziness or headache.  No other specific complaints in a complete review of systems (except as listed in HPI above).   Objective  Vitals:   08/06/21 1144  BP: 130/72  Pulse: 96  Resp: 16  SpO2: 97%  Weight: 266 lb (120.7 kg)  Height: 5\' 8"  (1.727 m)    Body mass index is 40.45 kg/m.  Physical Exam  Constitutional: Patient appears well-developed and well-nourished. Obese  No distress.  HEENT: head atraumatic, normocephalic, pupils equal and reactive to light, neck supple Cardiovascular: Normal rate, regular rhythm and normal heart sounds.  No murmur heard. No BLE edema. Pulmonary/Chest: Effort normal and breath sounds normal. No respiratory  distress. Abdominal: Soft.  There is no tenderness. Psychiatric: Patient has a normal mood and affect. behavior is normal. Judgment and thought content normal.   Recent Results (from the past 2160 hour(s))  POCT HgB A1C     Status: Abnormal   Collection Time: 08/06/21 11:56 AM  Result Value Ref Range   Hemoglobin A1C 6.5 (A) 4.0 - 5.6 %   HbA1c POC (<> result, manual entry)     HbA1c, POC (prediabetic range)     HbA1c, POC (controlled diabetic range)       PHQ2/9:    08/06/2021   11:55 AM 07/11/2021    3:10 PM 04/03/2021   11:21 AM 11/26/2020   11:34 AM 09/10/2020   11:43 AM  Depression screen PHQ 2/9  Decreased Interest 2 1 1 2 2   Down, Depressed, Hopeless 1 1 2 2 2   PHQ - 2 Score 3 2 3 4 4   Altered sleeping 0 0 0 0 1  Tired, decreased energy 0 1 2 1 3   Change in appetite 3 0 0 0 0  Feeling bad or failure about yourself  0 0 1 2 0  Trouble concentrating 0 0 0 0 0  Moving slowly or fidgety/restless 0 0 0 0 0  Suicidal thoughts 0 0 0 1 0  PHQ-9 Score 6 3 6 8 8   Difficult doing work/chores  Not difficult at all  Very difficult     phq 9 is positive   Fall Risk:    08/06/2021   11:55 AM 07/11/2021    3:15 PM 04/03/2021   11:20 AM 11/26/2020   11:34 AM 09/10/2020   11:43 AM  Fall Risk   Falls in the past year? 0 1 1 0 0  Number falls in past yr: 0 0 0 0 0  Injury with Fall? 0 1 1 0 0  Risk for fall due to : No Fall Risks History of fall(s) No Fall Risks No Fall Risks   Follow up Falls prevention discussed Falls prevention discussed Falls prevention discussed Falls prevention discussed       Functional Status Survey: Is the patient deaf or have difficulty hearing?: No Does the patient have difficulty seeing, even when wearing glasses/contacts?: No Does  the patient have difficulty concentrating, remembering, or making decisions?: No Does the patient have difficulty walking or climbing stairs?: No Does the patient have difficulty dressing or bathing?: No Does the patient  have difficulty doing errands alone such as visiting a doctor's office or shopping?: No    Assessment & Plan  1. Dyslipidemia associated with type 2 diabetes mellitus (HCC)  - POCT HgB A1C  2. Major depression, recurrent, chronic (HCC)  - QUEtiapine (SEROQUEL) 25 MG tablet; TAKE 1 TABLET(25 MG) BY MOUTH AT BEDTIME  Dispense: 90 tablet; Refill: 1  3. Hypertension associated with type 2 diabetes mellitus (HCC)  At goal   4. Morbid obesity (HCC)  Discussed with the patient the risk posed by an increased BMI. Discussed importance of portion control, calorie counting and at least 150 minutes of physical activity weekly. Avoid sweet beverages and drink more water. Eat at least 6 servings of fruit and vegetables daily   Gave her samples of Rybelsus 3 mg to last until she gets Ozempic  5. Mood disorder (HCC)  Stable   6. Benign essential HTN  At goal  7. Vitamin B12 deficiency  Resume supplements   8. Vitamin D deficiency  Resume supplements  9. Need for shingles vaccine  - Zoster Vaccine Adjuvanted [facility]) injection; Inject 0.5 mLs into the muscle once for 1 dose.  Dispense: 0.5 mL; Refill: 0

## 2021-08-06 ENCOUNTER — Encounter: Payer: Self-pay | Admitting: Family Medicine

## 2021-08-06 ENCOUNTER — Ambulatory Visit (INDEPENDENT_AMBULATORY_CARE_PROVIDER_SITE_OTHER): Payer: PPO | Admitting: Family Medicine

## 2021-08-06 VITALS — BP 130/72 | HR 96 | Resp 16 | Ht 68.0 in | Wt 266.0 lb

## 2021-08-06 DIAGNOSIS — E538 Deficiency of other specified B group vitamins: Secondary | ICD-10-CM

## 2021-08-06 DIAGNOSIS — I10 Essential (primary) hypertension: Secondary | ICD-10-CM

## 2021-08-06 DIAGNOSIS — E1169 Type 2 diabetes mellitus with other specified complication: Secondary | ICD-10-CM | POA: Diagnosis not present

## 2021-08-06 DIAGNOSIS — E785 Hyperlipidemia, unspecified: Secondary | ICD-10-CM

## 2021-08-06 DIAGNOSIS — I152 Hypertension secondary to endocrine disorders: Secondary | ICD-10-CM

## 2021-08-06 DIAGNOSIS — Z23 Encounter for immunization: Secondary | ICD-10-CM

## 2021-08-06 DIAGNOSIS — F39 Unspecified mood [affective] disorder: Secondary | ICD-10-CM | POA: Diagnosis not present

## 2021-08-06 DIAGNOSIS — F339 Major depressive disorder, recurrent, unspecified: Secondary | ICD-10-CM

## 2021-08-06 DIAGNOSIS — E559 Vitamin D deficiency, unspecified: Secondary | ICD-10-CM | POA: Diagnosis not present

## 2021-08-06 DIAGNOSIS — E1159 Type 2 diabetes mellitus with other circulatory complications: Secondary | ICD-10-CM

## 2021-08-06 LAB — POCT GLYCOSYLATED HEMOGLOBIN (HGB A1C): Hemoglobin A1C: 6.5 % — AB (ref 4.0–5.6)

## 2021-08-06 MED ORDER — OZEMPIC (0.25 OR 0.5 MG/DOSE) 2 MG/1.5ML ~~LOC~~ SOPN
0.5000 mg | PEN_INJECTOR | SUBCUTANEOUS | 3 refills | Status: DC
Start: 2021-08-06 — End: 2021-12-09

## 2021-08-06 MED ORDER — METFORMIN HCL ER 750 MG PO TB24: 1500.0000 mg | ORAL_TABLET | Freq: Every day | ORAL | 1 refills | Status: DC

## 2021-08-06 MED ORDER — QUETIAPINE FUMARATE 25 MG PO TABS: ORAL_TABLET | ORAL | 1 refills | Status: DC

## 2021-08-06 MED ORDER — SHINGRIX 50 MCG/0.5ML IM SUSR: 0.5000 mL | Freq: Once | INTRAMUSCULAR | 0 refills | Status: AC

## 2021-08-08 ENCOUNTER — Telehealth: Payer: Self-pay

## 2021-08-08 NOTE — Progress Notes (Signed)
    Chronic Care Management Pharmacy Assistant   Name: Tina Short  MRN: [id number] DOB: 10-Apr-1955  Reason for Encounter: Patient Assistance Application for Ozempic  I spoke with a representative with [employer] and per the representative the proof of income that was submitted is still not acceptable. The patient will have to submit her real 1099 or her award letter that she receives from [employer] ever year in order for [employer] to move forward with processing the patient's application.  [name] has been notified.    Left message informing patient.  Medications: Outpatient Encounter Medications as of 08/08/2021  Medication Sig   allopurinol (ZYLOPRIM) 100 MG tablet Take 1 tablet (100 mg total) by mouth 2 (two) times daily.   atorvastatin (LIPITOR) 40 MG tablet Take 1 tablet (40 mg total) by mouth at bedtime.   blood glucose meter kit and supplies Dispense based on patient and insurance preference. Use up to four times daily as directed. (FOR ICD-10 E10.9, E11.9).   buPROPion (WELLBUTRIN XL) 150 MG 24 hr tablet TAKE 1 TABLET(150 MG) BY MOUTH DAILY WITH BREAKFAST   Cholecalciferol (VITAMIN D) 50 MCG (2000 UT) CAPS Take 1 capsule by mouth daily at 12 noon. (Patient not taking: Reported on 08/06/2021)   fenofibrate (TRICOR) 145 MG tablet TAKE 1 TABLET(145 MG) BY MOUTH DAILY   losartan (COZAAR) 100 MG tablet Take 1 tablet (100 mg total) by mouth daily.   metFORMIN (GLUCOPHAGE-XR) 750 MG 24 hr tablet Take 2 tablets (1,500 mg total) by mouth daily with breakfast.   ONETOUCH ULTRA test strip TEST UP TO FOUR TIMES A DAY AS DIRECTED   QUEtiapine (SEROQUEL) 25 MG tablet TAKE 1 TABLET(25 MG) BY MOUTH AT BEDTIME   Semaglutide,0.25 or 0.5MG /DOS, (OZEMPIC, 0.25 OR 0.5 MG/DOSE,) 2 MG/1.5ML SOPN Inject 0.5 mg into the skin once a week. Through [employer] PAP   venlafaxine XR (EFFEXOR-XR) 150 MG 24 hr capsule Take 1 capsule (150 mg total) by mouth daily with breakfast.   vitamin B-12 (CYANOCOBALAMIN)  500 MCG tablet Take 500 mcg by mouth daily. (Patient not taking: Reported on 08/06/2021)   No facility-administered encounter medications on file as of 08/08/2021.   Lynann Bologna, CPA/CMA Clinical Pharmacist Assistant Phone: [phone]

## 2021-08-29 NOTE — Telephone Encounter (Addendum)
Patient received a letter stating proof of income and insurance card is needed. Patient spouse will drop off proof of income and patient would like nurse to print insurance card and fax. Patient is requesting a follow up call when completed.

## 2021-09-16 ENCOUNTER — Other Ambulatory Visit: Payer: Self-pay | Admitting: Family Medicine

## 2021-09-16 DIAGNOSIS — F339 Major depressive disorder, recurrent, unspecified: Secondary | ICD-10-CM

## 2021-09-18 ENCOUNTER — Telehealth: Payer: Self-pay

## 2021-09-18 NOTE — Telephone Encounter (Signed)
[place] Patient Assistance form for Ozempic completed by patient and faxed for review on 09/18/21  Malva Limes, [place] Pharmacist Practitioner  [facility] [phone]

## 2021-10-02 ENCOUNTER — Ambulatory Visit
Admission: RE | Admit: 2021-10-02 | Discharge: 2021-10-02 | Disposition: A | Discharge status: Home / Self Care | Payer: PPO | Source: Ambulatory Visit | Attending: Family Medicine | Admitting: Family Medicine

## 2021-10-02 DIAGNOSIS — Z780 Asymptomatic menopausal state: Secondary | ICD-10-CM | POA: Diagnosis not present

## 2021-10-02 DIAGNOSIS — Z1231 Encounter for screening mammogram for malignant neoplasm of breast: Secondary | ICD-10-CM | POA: Diagnosis not present

## 2021-10-25 ENCOUNTER — Other Ambulatory Visit: Payer: Self-pay | Admitting: Family Medicine

## 2021-11-25 ENCOUNTER — Telehealth: Payer: Self-pay

## 2021-11-25 NOTE — Progress Notes (Signed)
    Chronic Care Management Pharmacy Assistant   Name: Tina Short  MRN: [id number] DOB: 08/29/1955  Reason for Encounter: 1427 Renewal Application for Ozempic   Patient is currently receiving patient assistance for Ozempic through [place] requires all Medicare patient's to renew their applications prior to 67/02/1001 for the 2024 year.  Renewal Application started, and will be mailed to the patient's home address for her to complete. Once completed by the patient she will be instructed to return the application along with her proof of income to [place] [facility]  Application e-mailed to PTM so she can mail the application to the patient's home address. Patient can contact me directly @ [phone] if she has any questions.  Medications: Outpatient Encounter Medications as of 11/25/2021  Medication Sig   venlafaxine XR (EFFEXOR-XR) 150 MG 24 hr capsule TAKE 1 CAPSULE(150 MG) BY MOUTH DAILY WITH BREAKFAST   allopurinol (ZYLOPRIM) 100 MG tablet Take 1 tablet (100 mg total) by mouth 2 (two) times daily.   atorvastatin (LIPITOR) 40 MG tablet Take 1 tablet (40 mg total) by mouth at bedtime.   blood glucose meter kit and supplies Dispense based on patient and insurance preference. Use up to four times daily as directed. (FOR ICD-10 E10.9, E11.9).   buPROPion (WELLBUTRIN XL) 150 MG 24 hr tablet TAKE 1 TABLET(150 MG) BY MOUTH DAILY WITH BREAKFAST   Cholecalciferol (VITAMIN D) 50 MCG (2000 UT) CAPS Take 1 capsule by mouth daily at 12 noon. (Patient not taking: Reported on 08/06/2021)   fenofibrate (TRICOR) 145 MG tablet TAKE 1 TABLET(145 MG) BY MOUTH DAILY   losartan (COZAAR) 100 MG tablet Take 1 tablet (100 mg total) by mouth daily.   metFORMIN (GLUCOPHAGE-XR) 750 MG 24 hr tablet Take 2 tablets (1,500 mg total) by mouth daily with breakfast.   ONETOUCH ULTRA test strip TEST UP TO FOUR TIMES A DAY AS DIRECTED   QUEtiapine (SEROQUEL)  25 MG tablet TAKE 1 TABLET(25 MG) BY MOUTH AT BEDTIME   Semaglutide,0.25 or 0.5MG/DOS, (OZEMPIC, 0.25 OR 0.5 MG/DOSE,) 2 MG/1.5ML SOPN Inject 0.5 mg into the skin once a week. Through [employer] PAP   vitamin B-12 (CYANOCOBALAMIN) 500 MCG tablet Take 500 mcg by mouth daily. (Patient not taking: Reported on 08/06/2021)   No facility-administered encounter medications on file as of 11/25/2021.    Lynann Bologna, CPA/CMA Clinical Pharmacist Assistant Phone: [phone]

## 2021-11-29 ENCOUNTER — Other Ambulatory Visit: Payer: Self-pay | Admitting: Family Medicine

## 2021-11-29 DIAGNOSIS — M109 Gout, unspecified: Secondary | ICD-10-CM

## 2021-11-29 DIAGNOSIS — F339 Major depressive disorder, recurrent, unspecified: Secondary | ICD-10-CM

## 2021-12-09 ENCOUNTER — Encounter: Payer: Self-pay | Admitting: Family Medicine

## 2021-12-09 ENCOUNTER — Ambulatory Visit (INDEPENDENT_AMBULATORY_CARE_PROVIDER_SITE_OTHER): Payer: PPO | Admitting: Family Medicine

## 2021-12-09 VITALS — BP 132/70 | HR 93 | Temp 98.1°F | Resp 18 | Ht 68.0 in | Wt 274.4 lb

## 2021-12-09 DIAGNOSIS — E1169 Type 2 diabetes mellitus with other specified complication: Secondary | ICD-10-CM | POA: Diagnosis not present

## 2021-12-09 DIAGNOSIS — F338 Other recurrent depressive disorders: Secondary | ICD-10-CM | POA: Diagnosis not present

## 2021-12-09 DIAGNOSIS — M170 Bilateral primary osteoarthritis of knee: Secondary | ICD-10-CM

## 2021-12-09 DIAGNOSIS — I152 Hypertension secondary to endocrine disorders: Secondary | ICD-10-CM

## 2021-12-09 DIAGNOSIS — E782 Mixed hyperlipidemia: Secondary | ICD-10-CM | POA: Diagnosis not present

## 2021-12-09 DIAGNOSIS — E1159 Type 2 diabetes mellitus with other circulatory complications: Secondary | ICD-10-CM | POA: Diagnosis not present

## 2021-12-09 DIAGNOSIS — E538 Deficiency of other specified B group vitamins: Secondary | ICD-10-CM

## 2021-12-09 DIAGNOSIS — E559 Vitamin D deficiency, unspecified: Secondary | ICD-10-CM | POA: Diagnosis not present

## 2021-12-09 DIAGNOSIS — Z23 Encounter for immunization: Secondary | ICD-10-CM

## 2021-12-09 DIAGNOSIS — E785 Hyperlipidemia, unspecified: Secondary | ICD-10-CM

## 2021-12-09 DIAGNOSIS — R600 Localized edema: Secondary | ICD-10-CM | POA: Diagnosis not present

## 2021-12-09 DIAGNOSIS — I10 Essential (primary) hypertension: Secondary | ICD-10-CM | POA: Diagnosis not present

## 2021-12-09 DIAGNOSIS — F339 Major depressive disorder, recurrent, unspecified: Secondary | ICD-10-CM | POA: Diagnosis not present

## 2021-12-09 LAB — POCT GLYCOSYLATED HEMOGLOBIN (HGB A1C): Hemoglobin A1C: 7.3 % — AB (ref 4.0–5.6)

## 2021-12-09 MED ORDER — METFORMIN HCL ER 750 MG PO TB24: 1500.0000 mg | ORAL_TABLET | Freq: Every day | ORAL | 1 refills | Status: DC

## 2021-12-09 MED ORDER — BUPROPION HCL ER (XL) 300 MG PO TB24: 300.0000 mg | ORAL_TABLET | Freq: Every day | ORAL | 1 refills | Status: DC

## 2021-12-09 MED ORDER — FENOFIBRATE 145 MG PO TABS: 145.0000 mg | ORAL_TABLET | Freq: Every day | ORAL | 1 refills | Status: DC

## 2021-12-09 MED ORDER — ATORVASTATIN CALCIUM 40 MG PO TABS: 40.0000 mg | ORAL_TABLET | Freq: Every day | ORAL | 1 refills | Status: DC

## 2021-12-09 MED ORDER — SEMAGLUTIDE (1 MG/DOSE) 4 MG/3ML ~~LOC~~ SOPN: 1.0000 mg | PEN_INJECTOR | SUBCUTANEOUS | 3 refills | Status: DC

## 2021-12-09 MED ORDER — QUETIAPINE FUMARATE 25 MG PO TABS: ORAL_TABLET | ORAL | 1 refills | Status: DC

## 2021-12-09 NOTE — Progress Notes (Signed)
Name: Tina Short   MRN: [id number]    DOB: 01/30/1956   Date:12/09/2021       Progress Note  Subjective  Chief Complaint  Follow up   HPI  DMII: she has a history of DM for many years, A1C was 6.8% it went up to 8 %  after that down to 6.8% today it is down to 5.3 % , she is off glipizide, continue Metformin 1500 mg she was out of Ozempic for a couple of months, but has been back on medication for about 2  months and A1C is up to 7.3 % , we will adjust dose of Ozempic to 1 mg   She denies  polydipsia, no  polyuria , she states has increase in appetite after dinner. She takes statin, off Lovaza ( due to size and bad taste)  and is back on fenofibrate , Losartan ( ARB) for microabuminuria She has neuropathy, she denies pain just has numbness and seems to be doing better. She cannot tolerate SGL-2 agonist because it caused yeast infection  Discussed increasing protein intake during the day to avoid over eating at night    Morbid obesity: she was doing well on Ozempic, it was curbing her appetite, last visit weight was down to 252 lbs but she was off medication and weight is up to 271.4 lbs    Major Depression: Her father committed suicide in his 26's and there are other family members with history of psychiatric illness. She denies suicidal thoughts, but would be fine if she died, she states that is a chronic feeling, she states she would never do it. No planning. She still feels hurt about the break down of her previous church - where she was a member for almost 42 years. Not doing well since Spring 2022 , we added Wellbutrin and also seroquel for sleep , she is doing better, able to sleep at night, mood has improved but has seasonal affective disorder and we will increase wellbutrin to 300 mg until Spring .   OSA: she uses CPAP every night otherwise she wakes up feeling shaky. Unchanged    B12 deficiency: she needs to resume supplementation , she has to have SL type    Gout: no recent  episodes, she is now on Allopurinol twice daily and denies side effects of medication. Unchanged    Vitamin D: she needs to go to pharmacy to get another prescription    HTN: she is taking medications, no chest pain, palpitation or dizziness . BP today is not great, she states usually in the 120 range and we will recheck before she goes home   OA knee: worse on right side, daily pain, worse with activity or standing, she was seen at [facility] , had left knee replacement 10/03/2020 , she want to hold off on right knee replacement at this time. She states the right knee is not as painful lately  DDD cervical spine: per patient neurosurgeon thinks the cause of her choking and coughing, but no need for intervention at this time. Unchanged    Patient Active Problem List   Diagnosis Date Noted   History of total knee replacement, left 10/16/2020   Esophageal dysphagia    Major depression, recurrent, chronic ([place]) 10/30/2015   Dyslipidemia associated with type 2 diabetes mellitus ([place]) 10/30/2015   Vitamin D deficiency 07/27/2015   Vitamin B12 deficiency 07/27/2015   History of anemia 09/12/2014   Carpal tunnel syndrome 09/12/2014   Obstructive sleep apnea  09/12/2014   Edema of extremities 09/12/2014   Controlled gout 09/12/2014   Morbid obesity ([place]) 11/15/2009   Benign essential HTN 10/07/2006   Type 2 diabetes mellitus, uncontrolled, with neuropathy 10/07/2006   Hyperlipidemia 10/07/2006    Past Surgical History:  Procedure Laterality Date   ABDOMINAL HYSTERECTOMY     COLONOSCOPY     COLONOSCOPY WITH PROPOFOL N/A 05/14/2016   Procedure: COLONOSCOPY WITH PROPOFOL;  Surgeon: [name], MD;  Location: [facility] ENDOSCOPY;  Service: Endoscopy;  Laterality: N/A;   COLONOSCOPY WITH PROPOFOL N/A 04/15/2018   Procedure: COLONOSCOPY WITH PROPOFOL;  Surgeon: Lin Landsman, MD;  Location: [facility] ENDOSCOPY;  Service: Gastroenterology;  Laterality: N/A;   ESOPHAGOGASTRODUODENOSCOPY  (EGD) WITH PROPOFOL N/A 04/15/2018   Procedure: ESOPHAGOGASTRODUODENOSCOPY (EGD) WITH PROPOFOL;  Surgeon: Lin Landsman, MD;  Location: [facility] ENDOSCOPY;  Service: Gastroenterology;  Laterality: N/A;   FOOT SURGERY Right    X2   SHOULDER SURGERY Right    TOTAL KNEE ARTHROPLASTY Left    Dr. Harlow Mares at [facility]   WRIST SURGERY Bilateral     Family History  Problem Relation Age of Onset   Diabetes Mother    Cancer Mother        uterine   Mental illness Father    Obesity Daughter    Diabetes Maternal Grandmother    Heart disease Brother    Obesity Daughter     Social History   Tobacco Use   Smoking status: Never   Smokeless tobacco: Never  Substance Use Topics   Alcohol use: No    Alcohol/week: 0.0 standard drinks of alcohol     Current Outpatient Medications:    allopurinol (ZYLOPRIM) 100 MG tablet, TAKE 1 TABLET BY MOUTH TWICE DAILY, Disp: 180 tablet, Rfl: 0   blood glucose meter kit and supplies, Dispense based on patient and insurance preference. Use up to four times daily as directed. (FOR ICD-10 E10.9, E11.9)., Disp: 1 each, Rfl: 0   losartan (COZAAR) 100 MG tablet, Take 1 tablet (100 mg total) by mouth daily., Disp: 90 tablet, Rfl: 1   ONETOUCH ULTRA test strip, TEST UP TO FOUR TIMES A DAY AS DIRECTED, Disp: 300 strip, Rfl: 0   Semaglutide, 1 MG/DOSE, 4 MG/3ML SOPN, Inject 1 mg as directed once a week., Disp: 9 mL, Rfl: 3   venlafaxine XR (EFFEXOR-XR) 150 MG 24 hr capsule, TAKE 1 CAPSULE(150 MG) BY MOUTH DAILY WITH BREAKFAST, Disp: 90 capsule, Rfl: 1   vitamin B-12 (CYANOCOBALAMIN) 500 MCG tablet, Take 500 mcg by mouth daily., Disp: , Rfl:    atorvastatin (LIPITOR) 40 MG tablet, Take 1 tablet (40 mg total) by mouth at bedtime., Disp: 90 tablet, Rfl: 1   buPROPion (WELLBUTRIN XL) 300 MG 24 hr tablet, Take 1 tablet (300 mg total) by mouth daily. TAKE 1 TABLET BY MOUTH EVERY DAY WITH BREAKFAST, Disp: 90 tablet, Rfl: 1   Cholecalciferol (VITAMIN D) 50 MCG (2000 UT)  CAPS, Take 1 capsule by mouth daily at 12 noon. (Patient not taking: Reported on 08/06/2021), Disp: , Rfl:    fenofibrate (TRICOR) 145 MG tablet, Take 1 tablet (145 mg total) by mouth daily., Disp: 90 tablet, Rfl: 1   metFORMIN (GLUCOPHAGE-XR) 750 MG 24 hr tablet, Take 2 tablets (1,500 mg total) by mouth daily with breakfast., Disp: 180 tablet, Rfl: 1   QUEtiapine (SEROQUEL) 25 MG tablet, TAKE 1 TABLET(25 MG) BY MOUTH AT BEDTIME, Disp: 90 tablet, Rfl: 1  Allergies  Allergen Reactions   Abilify [Aripiprazole]  Shortness Of Breath    Other reaction(s): Difficulty breathing   Invokana [Canagliflozin] Itching    Yeast infections   Metformin And Related     Diarrhea, but doing well with XR formulation     Lovaza [Omega-3-Acid Ethyl Esters] Other (See Comments)    Burping     I personally reviewed active problem list, medication list, allergies, family history, social history, health maintenance with the patient/caregiver today.   ROS  Constitutional: Negative for fever, positive for weight change.  Respiratory: Negative for cough and shortness of breath.   Cardiovascular: Negative for chest pain or palpitations.  Gastrointestinal: Negative for abdominal pain, no bowel changes.  Musculoskeletal: Negative for gait problem or joint swelling.  Skin: Negative for rash.  Neurological: Negative for dizziness or headache.  No other specific complaints in a complete review of systems (except as listed in HPI above).   Objective  Vitals:   12/09/21 0845  BP: 132/70  Pulse: 93  Resp: 18  Temp: 98.1 F (36.7 C)  TempSrc: Oral  SpO2: 95%  Weight: 274 lb 6.4 oz (124.5 kg)  Height: _0  (1.727 m)    Body mass index is 41.72 kg/m.  Physical Exam  Constitutional: Patient appears well-developed and well-nourished. Obese  No distress.  HEENT: head atraumatic, normocephalic, pupils equal and reactive to light, neck supple Cardiovascular: Normal rate, regular rhythm and normal heart  sounds.  No murmur heard. Trace  BLE edema. Pulmonary/Chest: Effort normal and breath sounds normal. No respiratory distress. Abdominal: Soft.  There is no tenderness. Psychiatric: Patient has a normal mood and affect. behavior is normal. Judgment and thought content normal.   Recent Results (from the past 2160 hour(s))  POCT HgB A1C     Status: Abnormal   Collection Time: 12/09/21  8:48 AM  Result Value Ref Range   Hemoglobin A1C 7.3 (A) 4.0 - 5.6 %   HbA1c POC (<> result, manual entry)     HbA1c, POC (prediabetic range)     HbA1c, POC (controlled diabetic range)       PHQ2/9:    12/09/2021    8:50 AM 08/06/2021   11:55 AM 07/11/2021    3:10 PM 04/03/2021   11:21 AM 11/26/2020   11:34 AM  Depression screen PHQ 2/9  Decreased Interest _1 Down, Depressed, Hopeless _2 PHQ - 2 Score _3 Altered sleeping 1 0 0 0 0  Tired, decreased energy 2 0 _4 Change in appetite 1 3 0 0 0  Feeling bad or failure about yourself  1 0 0 1 2  Trouble concentrating 0 0 0 0 0  Moving slowly or fidgety/restless 0 0 0 0 0  Suicidal thoughts 0 0 0 0 1  PHQ-9 Score _5 Difficult doing work/chores Somewhat difficult  Not difficult at all  Very difficult    phq 9 is positive   Fall Risk:    12/09/2021    8:46 AM 08/06/2021   11:55 AM 07/11/2021    3:15 PM 04/03/2021   11:20 AM 11/26/2020   11:34 AM  Fall Risk   Falls in the past year? 1 0 1 1 0  Number falls in past yr: 0 0 0 0 0  Injury with Fall? 1 0 1 1 0  Risk for fall due to : History of fall(s) No Fall Risks History of fall(s) No Fall Risks  No Fall Risks  Follow up Falls prevention discussed;Education provided;Falls evaluation completed Falls prevention discussed Falls prevention discussed Falls prevention discussed Falls prevention discussed     Functional Status Survey: Is the patient deaf or have difficulty hearing?: No Does the patient have difficulty seeing, even when wearing glasses/contacts?:  No Does the patient have difficulty concentrating, remembering, or making decisions?: No Does the patient have difficulty walking or climbing stairs?: No Does the patient have difficulty dressing or bathing?: No Does the patient have difficulty doing errands alone such as visiting a doctor's office or shopping?: No    Assessment & Plan  1. Dyslipidemia associated with type 2 diabetes mellitus (HCC)  - Semaglutide, 1 MG/DOSE, 4 MG/3ML SOPN; Inject 1 mg as directed once a week.  Dispense: 9 mL; Refill: 3 - atorvastatin (LIPITOR) 40 MG tablet; Take 1 tablet (40 mg total) by mouth at bedtime.  Dispense: 90 tablet; Refill: 1 - metFORMIN (GLUCOPHAGE-XR) 750 MG 24 hr tablet; Take 2 tablets (1,500 mg total) by mouth daily with breakfast.  Dispense: 180 tablet; Refill: 1  2. Major depression, recurrent, chronic (HCC)  - buPROPion (WELLBUTRIN XL) 300 MG 24 hr tablet; Take 1 tablet (300 mg total) by mouth daily. TAKE 1 TABLET BY MOUTH EVERY DAY WITH BREAKFAST  Dispense: 90 tablet; Refill: 1 - QUEtiapine (SEROQUEL) 25 MG tablet; TAKE 1 TABLET(25 MG) BY MOUTH AT BEDTIME  Dispense: 90 tablet; Refill: 1  3. Seasonal affective disorder ([place])  We will  increase dose of Wellbutrin   4. Hypertension associated with type 2 diabetes mellitus ([place])   5. Morbid obesity ([place])  Discussed with the patient the risk posed by an increased BMI. Discussed importance of portion control, calorie counting and at least 150 minutes of physical activity weekly. Avoid sweet beverages and drink more water. Eat at least 6 servings of fruit and vegetables daily    6. Need for immunization against influenza  - POCT HgB A1C  7. Mixed dyslipidemia  - fenofibrate (TRICOR) 145 MG tablet; Take 1 tablet (145 mg total) by mouth daily.  Dispense: 90 tablet; Refill: 1 - atorvastatin (LIPITOR) 40 MG tablet; Take 1 tablet (40 mg total) by mouth at bedtime.  Dispense: 90 tablet; Refill: 1  8. Vitamin B12 deficiency   9.  Vitamin D deficiency   10. Benign essential HTN   11. Bilateral lower extremity edema   12. Primary osteoarthritis of both knees

## 2022-01-27 ENCOUNTER — Other Ambulatory Visit: Payer: Self-pay | Admitting: Family Medicine

## 2022-01-27 DIAGNOSIS — I10 Essential (primary) hypertension: Secondary | ICD-10-CM

## 2022-03-31 NOTE — Progress Notes (Unsigned)
Name: Tina Short   MRN: [id number]    DOB: 1955/11/03   Date:04/01/2022       Progress Note  Subjective  Chief Complaint  Follow Up  HPI  DMII: she has a history of DM for many years, last A1C was 7.3 % while on Metformin but was out of Ozempic she is back on Ozempic 0.5 mg and A1C is down to 6.7 %. She states mouth is always dry and needs to drink water all the time. . She takes statin and Tricor ( unable to afford Lovaza )  Losartan ( ARB) for microabuminuria She has neuropathy but doing well now without gabapentin. She cannot tolerate SGL-2 agonist because it caused yeast infection     Morbid obesity: she was doing well on Ozempic, it was curbing her appetite, last visit weight was down to 252 lbs but she was off medication and weight went up to 274 lbs. She was able to get assistance and is taking Ozempic 0.5 mg , weight is down 12 lbs since last visit, new rx will be for 1 mg , sending new paperwork today   Major Depression: Her father committed suicide in his 27's and there are other family members with history of psychiatric illness. She denies suicidal thoughts, but would be fine if she died, she states that is a chronic feeling, she states she would never do it. No planning. She still feels hurt about the break down of her previous church - where she was a member for almost 80 years. Not doing well since Spring 2022 , we added Wellbutrin and also seroquel for sleep , she is doing better, able to sleep at night, mood has improved but has seasonal affective disorder but also doing better since we adjusted dose of Wellbutrin to 300 mg from Fall to Spring, she will try going down to 150 mg in [place] spring again  OSA: she uses CPAP every night otherwise she wakes up feeling shaky, she wakes up feeling well    B12 deficiency: she needs to resume supplementation , she has to have SL type    Gout: no episodes since taking Allopurinol bid, no side effects    Vitamin D: she is taking otc  supplementation    HTN: she is taking medications, no chest pain, palpitation or dizziness  BP is at goal.   OA knee: worse on right side, daily pain, worse with activity or standing, she was seen at [facility] , had left knee replacement 10/03/2020 , she is not ready to have surgery yet   Left lower back spasms: since yesterday, mild , woke up with the sensation, not constant and not going down her leg . No dysuria, no hematuria. Offered muscle relaxer but she wants to hold off   DDD cervical spine: per patient neurosurgeon thinks the cause of her choking and coughing, but no need for intervention at this time. Unchanged   Patient Active Problem List   Diagnosis Date Noted   History of total knee replacement, left 10/16/2020   Esophageal dysphagia    Major depression, recurrent, chronic ([place]) 10/30/2015   Dyslipidemia associated with type 2 diabetes mellitus ([place]) 10/30/2015   Vitamin D deficiency 07/27/2015   Vitamin B12 deficiency 07/27/2015   History of anemia 09/12/2014   Carpal tunnel syndrome 09/12/2014   Obstructive sleep apnea 09/12/2014   Edema of extremities 09/12/2014   Controlled gout 09/12/2014   Morbid obesity ([place]) 11/15/2009   Benign essential HTN 10/07/2006  Type 2 diabetes mellitus, uncontrolled, with neuropathy 10/07/2006   Hyperlipidemia 10/07/2006    Past Surgical History:  Procedure Laterality Date   ABDOMINAL HYSTERECTOMY     COLONOSCOPY     COLONOSCOPY WITH PROPOFOL N/A 05/14/2016   Procedure: COLONOSCOPY WITH PROPOFOL;  Surgeon: [name], MD;  Location: [facility] ENDOSCOPY;  Service: Endoscopy;  Laterality: N/A;   COLONOSCOPY WITH PROPOFOL N/A 04/15/2018   Procedure: COLONOSCOPY WITH PROPOFOL;  Surgeon: Lin Landsman, MD;  Location: [facility] ENDOSCOPY;  Service: Gastroenterology;  Laterality: N/A;   ESOPHAGOGASTRODUODENOSCOPY (EGD) WITH PROPOFOL N/A 04/15/2018   Procedure: ESOPHAGOGASTRODUODENOSCOPY (EGD) WITH PROPOFOL;  Surgeon: Lin Landsman, MD;  Location: [facility] ENDOSCOPY;  Service: Gastroenterology;  Laterality: N/A;   FOOT SURGERY Right    X2   SHOULDER SURGERY Right    TOTAL KNEE ARTHROPLASTY Left    Dr. Harlow Mares at [facility]   WRIST SURGERY Bilateral     Family History  Problem Relation Age of Onset   Diabetes Mother    Cancer Mother        uterine   Mental illness Father    Obesity Daughter    Diabetes Maternal Grandmother    Heart disease Brother    Obesity Daughter     Social History   Tobacco Use   Smoking status: Never   Smokeless tobacco: Never  Substance Use Topics   Alcohol use: No    Alcohol/week: 0.0 standard drinks of alcohol     Current Outpatient Medications:    atorvastatin (LIPITOR) 40 MG tablet, Take 1 tablet (40 mg total) by mouth at bedtime., Disp: 90 tablet, Rfl: 1   blood glucose meter kit and supplies, Dispense based on patient and insurance preference. Use up to four times daily as directed. (FOR ICD-10 E10.9, E11.9)., Disp: 1 each, Rfl: 0   buPROPion (WELLBUTRIN XL) 300 MG 24 hr tablet, Take 1 tablet (300 mg total) by mouth daily. TAKE 1 TABLET BY MOUTH EVERY DAY WITH BREAKFAST, Disp: 90 tablet, Rfl: 1   fenofibrate (TRICOR) 145 MG tablet, Take 1 tablet (145 mg total) by mouth daily., Disp: 90 tablet, Rfl: 1   metFORMIN (GLUCOPHAGE-XR) 750 MG 24 hr tablet, Take 2 tablets (1,500 mg total) by mouth daily with breakfast., Disp: 180 tablet, Rfl: 1   ONETOUCH ULTRA test strip, TEST UP TO FOUR TIMES A DAY AS DIRECTED, Disp: 300 strip, Rfl: 0   QUEtiapine (SEROQUEL) 25 MG tablet, TAKE 1 TABLET(25 MG) BY MOUTH AT BEDTIME, Disp: 90 tablet, Rfl: 1   vitamin B-12 (CYANOCOBALAMIN) 500 MCG tablet, Take 500 mcg by mouth daily., Disp: , Rfl:    allopurinol (ZYLOPRIM) 100 MG tablet, Take 1 tablet (100 mg total) by mouth 2 (two) times daily., Disp: 180 tablet, Rfl: 1   Cholecalciferol (VITAMIN D) 50 MCG (2000 UT) CAPS, Take 1 capsule by mouth daily at 12 noon. (Patient not taking: Reported on  08/06/2021), Disp: , Rfl:    losartan (COZAAR) 100 MG tablet, Take 1 tablet (100 mg total) by mouth daily., Disp: 90 tablet, Rfl: 1   Semaglutide, 1 MG/DOSE, 4 MG/3ML SOPN, Inject 1 mg as directed once a week., Disp: 9 mL, Rfl: 3   venlafaxine XR (EFFEXOR-XR) 150 MG 24 hr capsule, TAKE 1 CAPSULE(150 MG) BY MOUTH DAILY WITH BREAKFAST, Disp: 90 capsule, Rfl: 1  Allergies  Allergen Reactions   Abilify [Aripiprazole] Shortness Of Breath    Other reaction(s): Difficulty breathing   Invokana [Canagliflozin] Itching    Yeast infections  Metformin And Related     Diarrhea, but doing well with XR formulation     Lovaza [Omega-3-Acid Ethyl Esters] Other (See Comments)    Burping     I personally reviewed active problem list, medication list, allergies, family history, social history, health maintenance with the patient/caregiver today.   ROS  Ten systems reviewed and is negative except as mentioned in HPI   Objective  Vitals:   04/01/22 1202  BP: 126/72  Pulse: 94  Resp: 18  Temp: 98.2 F (36.8 C)  TempSrc: Oral  SpO2: 96%  Weight: 262 lb 1.6 oz (118.9 kg)  Height: 5' 8"$  (1.727 m)    Body mass index is 39.85 kg/m.  Physical Exam  Constitutional: Patient appears well-developed and well-nourished. Obese  No distress.  HEENT: head atraumatic, normocephalic, pupils equal and reactive to light,, neck supple Cardiovascular: Normal rate, regular rhythm and normal heart sounds.  No murmur heard. No BLE edema. Pulmonary/Chest: Effort normal and breath sounds normal. No respiratory distress. Abdominal: Soft.  There is no tenderness. Psychiatric: Patient has a normal mood and affect. behavior is normal. Judgment and thought content normal.   Recent Results (from the past 2160 hour(s))  POCT HgB A1C     Status: Abnormal   Collection Time: 04/01/22 12:03 PM  Result Value Ref Range   Hemoglobin A1C 6.7 (A) 4.0 - 5.6 %   HbA1c POC (<> result, manual entry)     HbA1c, POC (prediabetic  range)     HbA1c, POC (controlled diabetic range)      Diabetic Foot Exam: Diabetic Foot Exam - Simple   Simple Foot Form Visual Inspection See comments: Yes Sensation Testing See comments: Yes Pulse Check Posterior Tibialis and Dorsalis pulse intact bilaterally: Yes Comments Dry feet some callus formation, decrease in sensation on toes       PHQ2/9:    04/01/2022   11:57 AM 12/09/2021    8:50 AM 08/06/2021   11:55 AM 07/11/2021    3:10 PM 04/03/2021   11:21 AM  Depression screen PHQ 2/9  Decreased Interest 1 1 2 1 1  $ Down, Depressed, Hopeless 1 1 1 1 2  $ PHQ - 2 Score 2 2 3 2 3  $ Altered sleeping 1 1 0 0 0  Tired, decreased energy 1 2 0 1 2  Change in appetite 1 1 3 $ 0 0  Feeling bad or failure about yourself  1 1 0 0 1  Trouble concentrating 0 0 0 0 0  Moving slowly or fidgety/restless 0 0 0 0 0  Suicidal thoughts 0 0 0 0 0  PHQ-9 Score 6 7 6 3 6  $ Difficult doing work/chores Somewhat difficult Somewhat difficult  Not difficult at all     phq 9 is positive   Fall Risk:    04/01/2022   11:57 AM 12/09/2021    8:46 AM 08/06/2021   11:55 AM 07/11/2021    3:15 PM 04/03/2021   11:20 AM  Fall Risk   Falls in the past year? 0 1 0 1 1  Number falls in past yr:  0 0 0 0  Injury with Fall?  1 0 1 1  Risk for fall due to : No Fall Risks History of fall(s) No Fall Risks History of fall(s) No Fall Risks  Follow up Falls prevention discussed Falls prevention discussed;Education provided;Falls evaluation completed Falls prevention discussed Falls prevention discussed Falls prevention discussed     Assessment & Plan  1. Dyslipidemia associated with type  2 diabetes mellitus ([place])  - POCT HgB A1C - COMPLETE METABOLIC PANEL WITH GFR - HM Diabetes Foot Exam - Semaglutide, 1 MG/DOSE, 4 MG/3ML SOPN; Inject 1 mg as directed once a week.  Dispense: 9 mL; Refill: 3 - Lipid panel - Urine Microalbumin w/creat. ratio  2. Vitamin B12 deficiency  - CBC with Differential/Platelet - B12  and Folate Panel  3. Vitamin D deficiency  - VITAMIN D 25 Hydroxy (Vit-D Deficiency, Fractures)  4. Morbid obesity ([place])  Losing weight on Ozempic and life style modification   5. Mood disorder ([place])  Doing well with current regiment   6. Benign essential HTN  - CBC with Differential/Platelet - losartan (COZAAR) 100 MG tablet; Take 1 tablet (100 mg total) by mouth daily.  Dispense: 90 tablet; Refill: 1  7. Hypertension associated with type 2 diabetes mellitus (HCC)  - Comprehensive Metabolic Panel (CMET)  8. Major depression, recurrent, chronic (HCC)  - venlafaxine XR (EFFEXOR-XR) 150 MG 24 hr capsule; TAKE 1 CAPSULE(150 MG) BY MOUTH DAILY WITH BREAKFAST  Dispense: 90 capsule; Refill: 1  9. Controlled gout  - allopurinol (ZYLOPRIM) 100 MG tablet; Take 1 tablet (100 mg total) by mouth 2 (two) times daily.  Dispense: 180 tablet; Refill: 1

## 2022-04-01 ENCOUNTER — Ambulatory Visit (INDEPENDENT_AMBULATORY_CARE_PROVIDER_SITE_OTHER): Payer: PPO | Admitting: Family Medicine

## 2022-04-01 ENCOUNTER — Encounter: Payer: Self-pay | Admitting: Family Medicine

## 2022-04-01 VITALS — BP 126/72 | HR 94 | Temp 98.2°F | Resp 18 | Ht 68.0 in | Wt 262.1 lb

## 2022-04-01 DIAGNOSIS — E538 Deficiency of other specified B group vitamins: Secondary | ICD-10-CM

## 2022-04-01 DIAGNOSIS — E1159 Type 2 diabetes mellitus with other circulatory complications: Secondary | ICD-10-CM

## 2022-04-01 DIAGNOSIS — F339 Major depressive disorder, recurrent, unspecified: Secondary | ICD-10-CM

## 2022-04-01 DIAGNOSIS — E559 Vitamin D deficiency, unspecified: Secondary | ICD-10-CM | POA: Diagnosis not present

## 2022-04-01 DIAGNOSIS — M109 Gout, unspecified: Secondary | ICD-10-CM

## 2022-04-01 DIAGNOSIS — F39 Unspecified mood [affective] disorder: Secondary | ICD-10-CM

## 2022-04-01 DIAGNOSIS — E785 Hyperlipidemia, unspecified: Secondary | ICD-10-CM | POA: Diagnosis not present

## 2022-04-01 DIAGNOSIS — E1169 Type 2 diabetes mellitus with other specified complication: Secondary | ICD-10-CM

## 2022-04-01 DIAGNOSIS — I10 Essential (primary) hypertension: Secondary | ICD-10-CM | POA: Diagnosis not present

## 2022-04-01 DIAGNOSIS — I152 Hypertension secondary to endocrine disorders: Secondary | ICD-10-CM

## 2022-04-01 LAB — POCT GLYCOSYLATED HEMOGLOBIN (HGB A1C): Hemoglobin A1C: 6.7 % — AB (ref 4.0–5.6)

## 2022-04-01 MED ORDER — VENLAFAXINE HCL ER 150 MG PO CP24: ORAL_CAPSULE | ORAL | 1 refills | Status: DC

## 2022-04-01 MED ORDER — SEMAGLUTIDE (1 MG/DOSE) 4 MG/3ML ~~LOC~~ SOPN: 1.0000 mg | PEN_INJECTOR | SUBCUTANEOUS | 3 refills | Status: DC

## 2022-04-01 MED ORDER — LOSARTAN POTASSIUM 100 MG PO TABS: 100.0000 mg | ORAL_TABLET | Freq: Every day | ORAL | 1 refills | Status: DC

## 2022-04-01 MED ORDER — ALLOPURINOL 100 MG PO TABS: 100.0000 mg | ORAL_TABLET | Freq: Two times a day (BID) | ORAL | 1 refills | Status: DC

## 2022-04-19 ENCOUNTER — Other Ambulatory Visit: Payer: Self-pay | Admitting: Family Medicine

## 2022-04-19 DIAGNOSIS — F339 Major depressive disorder, recurrent, unspecified: Secondary | ICD-10-CM

## 2022-06-09 ENCOUNTER — Telehealth: Payer: Self-pay | Admitting: Family Medicine

## 2022-06-09 NOTE — Telephone Encounter (Signed)
Contacted Tina Short to schedule their annual wellness visit. Appointment made for 07/04/2022.  [facility] Care Guide [facility] AWV TEAM Direct Dial: [phone]

## 2022-07-04 ENCOUNTER — Ambulatory Visit (INDEPENDENT_AMBULATORY_CARE_PROVIDER_SITE_OTHER): Payer: PPO

## 2022-07-04 DIAGNOSIS — Z0000 Encounter for general adult medical examination without abnormal findings: Secondary | ICD-10-CM | POA: Diagnosis not present
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
## 2022-07-04 NOTE — Patient Instructions (Signed)

## 2022-07-05 ENCOUNTER — Other Ambulatory Visit: Payer: Self-pay | Admitting: Family Medicine

## 2022-07-05 DIAGNOSIS — I10 Essential (primary) hypertension: Secondary | ICD-10-CM

## 2022-08-01 ENCOUNTER — Ambulatory Visit: Payer: PPO | Admitting: Family Medicine

## 2022-08-04 DIAGNOSIS — I10 Essential (primary) hypertension: Secondary | ICD-10-CM | POA: Diagnosis not present

## 2022-08-04 DIAGNOSIS — I152 Hypertension secondary to endocrine disorders: Secondary | ICD-10-CM | POA: Diagnosis not present

## 2022-08-04 DIAGNOSIS — E1169 Type 2 diabetes mellitus with other specified complication: Secondary | ICD-10-CM | POA: Diagnosis not present

## 2022-08-04 DIAGNOSIS — E538 Deficiency of other specified B group vitamins: Secondary | ICD-10-CM | POA: Diagnosis not present

## 2022-08-04 DIAGNOSIS — E785 Hyperlipidemia, unspecified: Secondary | ICD-10-CM | POA: Diagnosis not present

## 2022-08-04 DIAGNOSIS — E1159 Type 2 diabetes mellitus with other circulatory complications: Secondary | ICD-10-CM | POA: Diagnosis not present

## 2022-08-04 DIAGNOSIS — E559 Vitamin D deficiency, unspecified: Secondary | ICD-10-CM | POA: Diagnosis not present

## 2022-08-05 LAB — VITAMIN D 25 HYDROXY (VIT D DEFICIENCY, FRACTURES): Vit D, 25-Hydroxy: 43.2 ng/mL (ref 30.0–100.0)

## 2022-08-05 LAB — LIPID PANEL
Chol/HDL Ratio: 3.6 ratio (ref 0.0–4.4)
Cholesterol, Total: 139 mg/dL (ref 100–199)
HDL: 39 mg/dL — ABNORMAL LOW (ref >39)
LDL Chol Calc (NIH): 72 mg/dL (ref 0–99)
Triglycerides: 164 mg/dL — ABNORMAL HIGH (ref 0–149)
VLDL Cholesterol Cal: 28 mg/dL (ref 5–40)

## 2022-08-05 LAB — COMPREHENSIVE METABOLIC PANEL
ALT: 27 IU/L (ref 0–32)
AST: 29 IU/L (ref 0–40)
Albumin: 4.4 g/dL (ref 3.9–4.9)
Alkaline Phosphatase: 48 IU/L (ref 44–121)
BUN/Creatinine Ratio: 21 (ref 12–28)
BUN: 16 mg/dL (ref 8–27)
Bilirubin Total: 0.6 mg/dL (ref 0.0–1.2)
CO2: 23 mmol/L (ref 20–29)
Calcium: 9.7 mg/dL (ref 8.7–10.3)
Chloride: 107 mmol/L — ABNORMAL HIGH (ref 96–106)
Creatinine, Ser: 0.76 mg/dL (ref 0.57–1.00)
Globulin, Total: 2.6 g/dL (ref 1.5–4.5)
Glucose: 132 mg/dL — ABNORMAL HIGH (ref 70–99)
Potassium: 4.7 mmol/L (ref 3.5–5.2)
Sodium: 145 mmol/L — ABNORMAL HIGH (ref 134–144)
Total Protein: 7 g/dL (ref 6.0–8.5)
eGFR: 85 mL/min/{1.73_m2} (ref >59)

## 2022-08-05 LAB — CBC WITH DIFFERENTIAL/PLATELET
Basophils Absolute: 0 10*3/uL (ref 0.0–0.2)
Basos: 1 %
EOS (ABSOLUTE): 0.1 10*3/uL (ref 0.0–0.4)
Eos: 1 %
Hematocrit: 41.5 % (ref 34.0–46.6)
Hemoglobin: 13.9 g/dL (ref 11.1–15.9)
Immature Grans (Abs): 0 10*3/uL (ref 0.0–0.1)
Immature Granulocytes: 0 %
Lymphocytes Absolute: 2.1 10*3/uL (ref 0.7–3.1)
Lymphs: 37 %
MCH: 31.9 pg (ref 26.6–33.0)
MCHC: 33.5 g/dL (ref 31.5–35.7)
MCV: 95 fL (ref 79–97)
Monocytes Absolute: 0.5 10*3/uL (ref 0.1–0.9)
Monocytes: 8 %
Neutrophils Absolute: 2.9 10*3/uL (ref 1.4–7.0)
Neutrophils: 53 %
Platelets: 180 10*3/uL (ref 150–450)
RBC: 4.36 x10E6/uL (ref 3.77–5.28)
RDW: 12.7 % (ref 11.7–15.4)
WBC: 5.6 10*3/uL (ref 3.4–10.8)

## 2022-08-05 LAB — B12 AND FOLATE PANEL
Folate: 5.8 ng/mL (ref >3.0)
Vitamin B-12: 578 pg/mL (ref 232–1245)

## 2022-08-05 LAB — MICROALBUMIN / CREATININE URINE RATIO
Creatinine, Urine: 195.3 mg/dL
Microalb/Creat Ratio: 12 mg/g creat (ref 0–29)
Microalbumin, Urine: 24.3 ug/mL

## 2022-08-06 NOTE — Progress Notes (Unsigned)
Name: Tina Short   MRN: [id number]    DOB: 22-Jul-1955   Date:08/07/2022       Progress Note  Subjective  Chief Complaint  Follow Up  HPI  DMII: she has a history of DM for many years, last A1C was 7.3 % while on Metformin but was out of Ozempic , last visit she was on Ozempic 0.5 mg and A1C went down to  6.7 %, we adjusted dose of Ozempic to 1 mg and today A1C is 5.6 % . She states mouth is always dry and needs to drink water all the time. She takes statin and Tricor ( unable to afford Lovaza )  Losartan ( ARB) for microabuminuria She has neuropathy but doing well now without gabapentin. She cannot tolerate SGL-2 agonist because it caused yeast infection     Morbid obesity: BMI over 35 with co-morbidities such as DM, HTN and OA, she is doing very well on  GLP-1 agonist, currently on 1 mg dose and lost 13 lbs since her visit Feb 2024. Weight is down from 262 lbs to 249.3 lbs. She states medication curbs her appetite , she craves carbohydrates when off medication   Major Depression: Her father committed suicide in his 81's and there are other family members with history of psychiatric illness. She denies suicidal thoughts, but would be fine if she died, she states that is a chronic feeling, she states she would never do it. No planning. She still feels hurt about the break down of her previous church - where she was a member for almost 50 years. Not doing well since Spring 2022 , we added Wellbutrin and also seroquel for sleep. She states she is doing a little better. Sleeping 6-8 hours per night , also has a little bit more energy on current dose of wellbutrin   OSA: she uses CPAP every night otherwise she wakes up feeling shaky. Unchanged    B12 deficiency: she is taking supplements and last level mid 500's    Gout: no episodes since taking Allopurinol bid. Unchanged    Vitamin D: she is taking otc supplementation , level is at goal    HTN: she is taking medications, no chest pain,  palpitation or dizziness  BP is towards low end of normal, likely due to weight loss . She will cut tablet of Losartan in half one week prior to her next visit   OA knee: worse on right side, daily pain, worse with activity or standing, she was seen at [facility] , had left knee replacement 10/03/2020 , she does not want to have replacement on right side yet   DDD cervical spine: per patient neurosurgeon thinks the cause of her choking and coughing, but no need for intervention at this time.Stable   Patient Active Problem List   Diagnosis Date Noted   History of total knee replacement, left 10/16/2020   Esophageal dysphagia    Major depression, recurrent, chronic (HCC) 10/30/2015   Dyslipidemia associated with type 2 diabetes mellitus (HCC) 10/30/2015   Vitamin D deficiency 07/27/2015   Vitamin B12 deficiency 07/27/2015   History of anemia 09/12/2014   Carpal tunnel syndrome 09/12/2014   Obstructive sleep apnea 09/12/2014   Edema of extremities 09/12/2014   Controlled gout 09/12/2014   Morbid obesity (HCC) 11/15/2009   Benign essential HTN 10/07/2006   Type 2 diabetes mellitus, uncontrolled, with neuropathy 10/07/2006   Hyperlipidemia 10/07/2006    Past Surgical History:  Procedure Laterality Date   ABDOMINAL  HYSTERECTOMY     COLONOSCOPY     COLONOSCOPY WITH PROPOFOL N/A 05/14/2016   Procedure: COLONOSCOPY WITH PROPOFOL;  Surgeon: Earline Mayotte, MD;  Location: [facility] ENDOSCOPY;  Service: Endoscopy;  Laterality: N/A;   COLONOSCOPY WITH PROPOFOL N/A 04/15/2018   Procedure: COLONOSCOPY WITH PROPOFOL;  Surgeon: Toney Reil, MD;  Location: [facility] ENDOSCOPY;  Service: Gastroenterology;  Laterality: N/A;   ESOPHAGOGASTRODUODENOSCOPY (EGD) WITH PROPOFOL N/A 04/15/2018   Procedure: ESOPHAGOGASTRODUODENOSCOPY (EGD) WITH PROPOFOL;  Surgeon: Toney Reil, MD;  Location: [facility] ENDOSCOPY;  Service: Gastroenterology;  Laterality: N/A;   FOOT SURGERY Right    X2   SHOULDER  SURGERY Right    TOTAL KNEE ARTHROPLASTY Left    Dr. Odis Luster at [facility]   WRIST SURGERY Bilateral     Family History  Problem Relation Age of Onset   Diabetes Mother    Cancer Mother        uterine   Mental illness Father    Obesity Daughter    Diabetes Maternal Grandmother    Heart disease Brother    Obesity Daughter     Social History   Tobacco Use   Smoking status: Never   Smokeless tobacco: Never  Substance Use Topics   Alcohol use: No    Alcohol/week: 0.0 standard drinks of alcohol     Current Outpatient Medications:    allopurinol (ZYLOPRIM) 100 MG tablet, Take 1 tablet (100 mg total) by mouth 2 (two) times daily., Disp: 180 tablet, Rfl: 1   atorvastatin (LIPITOR) 40 MG tablet, Take 1 tablet (40 mg total) by mouth at bedtime., Disp: 90 tablet, Rfl: 1   blood glucose meter kit and supplies, Dispense based on patient and insurance preference. Use up to four times daily as directed. (FOR ICD-10 E10.9, E11.9)., Disp: 1 each, Rfl: 0   buPROPion (WELLBUTRIN XL) 300 MG 24 hr tablet, Take 1 tablet (300 mg total) by mouth daily. TAKE 1 TABLET BY MOUTH EVERY DAY WITH BREAKFAST, Disp: 90 tablet, Rfl: 1   Cholecalciferol (VITAMIN D) 50 MCG (2000 UT) CAPS, Take 1 capsule by mouth daily at 12 noon., Disp: , Rfl:    fenofibrate (TRICOR) 145 MG tablet, Take 1 tablet (145 mg total) by mouth daily., Disp: 90 tablet, Rfl: 1   losartan (COZAAR) 100 MG tablet, TAKE 1 TABLET BY MOUTH EVERY DAY, Disp: 90 tablet, Rfl: 0   metFORMIN (GLUCOPHAGE-XR) 750 MG 24 hr tablet, Take 2 tablets (1,500 mg total) by mouth daily with breakfast., Disp: 180 tablet, Rfl: 1   ONETOUCH ULTRA test strip, TEST UP TO FOUR TIMES A DAY AS DIRECTED, Disp: 300 strip, Rfl: 0   QUEtiapine (SEROQUEL) 25 MG tablet, TAKE 1 TABLET(25 MG) BY MOUTH AT BEDTIME, Disp: 90 tablet, Rfl: 0   Semaglutide, 1 MG/DOSE, 4 MG/3ML SOPN, Inject 1 mg as directed once a week., Disp: 9 mL, Rfl: 3   venlafaxine XR (EFFEXOR-XR) 150 MG 24  hr capsule, TAKE 1 CAPSULE(150 MG) BY MOUTH DAILY WITH BREAKFAST, Disp: 90 capsule, Rfl: 1   vitamin B-12 (CYANOCOBALAMIN) 500 MCG tablet, Take 500 mcg by mouth daily., Disp: , Rfl:   Allergies  Allergen Reactions   Abilify [Aripiprazole] Shortness Of Breath    Other reaction(s): Difficulty breathing   Invokana [Canagliflozin] Itching    Yeast infections   Metformin And Related     Diarrhea, but doing well with XR formulation     Lovaza [Omega-3-Acid Ethyl Esters] Other (See Comments)    Burping  I personally reviewed active problem list, medication list, allergies, family history, social history, health maintenance with the patient/caregiver today.   ROS  Constitutional: Negative for fever , positive for  weight change.  Respiratory: Negative for cough and shortness of breath.   Cardiovascular: Negative for chest pain or palpitations.  Gastrointestinal: Negative for abdominal pain, no bowel changes.  Musculoskeletal: positive for gait problem and has right knee  joint swelling.  Skin: Negative for rash.  Neurological: Negative for dizziness or headache.  No other specific complaints in a complete review of systems (except as listed in HPI above).   Objective  Vitals:   08/07/22 1101  BP: 118/70  Pulse: 90  Resp: 16  Temp: 97.7 F (36.5 C)  TempSrc: Oral  SpO2: 95%  Weight: 249 lb 4.8 oz (113.1 kg)  Height: 5\' 8"  (1.727 m)    Body mass index is 37.91 kg/m.  Physical Exam  Constitutional: Patient appears well-developed and well-nourished. Obese  No distress.  HEENT: head atraumatic, normocephalic, pupils equal and reactive to light, neck supple Cardiovascular: Normal rate, regular rhythm and normal heart sounds.  No murmur heard. No BLE edema. Pulmonary/Chest: Effort normal and breath sounds normal. No respiratory distress. Abdominal: Soft.  There is no tenderness. Psychiatric: Patient has a normal mood and affect. behavior is normal. Judgment and thought  content normal.   Recent Results (from the past 2160 hour(s))  Lipid panel     Status: Abnormal   Collection Time: 08/04/22  2:44 PM  Result Value Ref Range   Cholesterol, Total 139 100 - 199 mg/dL   Triglycerides 914 (H) 0 - 149 mg/dL   HDL 39 (L) >78 mg/dL   VLDL Cholesterol Cal 28 5 - 40 mg/dL   LDL Chol Calc (NIH) 72 0 - 99 mg/dL   Chol/HDL Ratio 3.6 0.0 - 4.4 ratio    Comment:                                   T. Chol/HDL Ratio                                             Men  Women                               1/2 Avg.Risk  3.4    3.3                                   Avg.Risk  5.0    4.4                                2X Avg.Risk  9.6    7.1                                3X Avg.Risk 23.4   11.0   CBC with Differential/Platelet     Status: None   Collection Time: 08/04/22  2:44 PM  Result Value Ref Range   WBC 5.6 3.4 - 10.8 x10E3/uL   RBC 4.36 3.77 - 5.28  x10E6/uL   Hemoglobin 13.9 11.1 - 15.9 g/dL   Hematocrit 16.1 09.6 - 46.6 %   MCV 95 79 - 97 fL   MCH 31.9 26.6 - 33.0 pg   MCHC 33.5 31.5 - 35.7 g/dL   RDW 04.5 40.9 - 81.1 %   Platelets 180 150 - 450 x10E3/uL   Neutrophils 53 Not Estab. %   Lymphs 37 Not Estab. %   Monocytes 8 Not Estab. %   Eos 1 Not Estab. %   Basos 1 Not Estab. %   Neutrophils Absolute 2.9 1.4 - 7.0 x10E3/uL   Lymphocytes Absolute 2.1 0.7 - 3.1 x10E3/uL   Monocytes Absolute 0.5 0.1 - 0.9 x10E3/uL   EOS (ABSOLUTE) 0.1 0.0 - 0.4 x10E3/uL   Basophils Absolute 0.0 0.0 - 0.2 x10E3/uL   Immature Granulocytes 0 Not Estab. %   Immature Grans (Abs) 0.0 0.0 - 0.1 x10E3/uL  B12 and Folate Panel     Status: None   Collection Time: 08/04/22  2:44 PM  Result Value Ref Range   Vitamin B-12 578 232 - 1,245 pg/mL   Folate 5.8 >3.0 ng/mL    Comment: A serum folate concentration of less than 3.1 ng/mL is considered to represent clinical deficiency.   VITAMIN D 25 Hydroxy (Vit-D Deficiency, Fractures)     Status: None   Collection Time: 08/04/22  2:44 PM   Result Value Ref Range   Vit D, 25-Hydroxy 43.2 30.0 - 100.0 ng/mL    Comment: Vitamin D deficiency has been defined by the Institute of Medicine and an Endocrine Society practice guideline as a level of serum 25-OH vitamin D less than 20 ng/mL (1,2). The Endocrine Society went on to further define vitamin D insufficiency as a level between 21 and 29 ng/mL (2). 1. IOM (Institute of Medicine). 2010. Dietary reference    intakes for calcium and D. [place]: The    [employer]. 2. Holick MF, Binkley Vian, Bischoff-Ferrari HA, et al.    Evaluation, treatment, and prevention of vitamin D    deficiency: an Endocrine Society clinical practice    guideline. JCEM. 2011 Jul; 96(7):1911-30.   Urine Microalbumin w/creat. ratio     Status: None   Collection Time: 08/04/22  2:44 PM  Result Value Ref Range   Creatinine, Urine 195.3 Not Estab. mg/dL   Microalbumin, Urine 91.4 Not Estab. ug/mL   Microalb/Creat Ratio 12 0 - 29 mg/g creat    Comment:                        Normal:                0 -  29                        Moderately increased: 30 - 300                        Severely increased:       >300   Comprehensive Metabolic Panel (CMET)     Status: Abnormal   Collection Time: 08/04/22  2:44 PM  Result Value Ref Range   Glucose 132 (H) 70 - 99 mg/dL   BUN 16 8 - 27 mg/dL   Creatinine, Ser 7.82 0.57 - 1.00 mg/dL   eGFR 85 >95 AO/ZHY/8.65   BUN/Creatinine Ratio 21 12 - 28   Sodium 145 (H) 134 - 144 mmol/L  Potassium 4.7 3.5 - 5.2 mmol/L   Chloride 107 (H) 96 - 106 mmol/L   CO2 23 20 - 29 mmol/L   Calcium 9.7 8.7 - 10.3 mg/dL   Total Protein 7.0 6.0 - 8.5 g/dL   Albumin 4.4 3.9 - 4.9 g/dL   Globulin, Total 2.6 1.5 - 4.5 g/dL   Bilirubin Total 0.6 0.0 - 1.2 mg/dL   Alkaline Phosphatase 48 44 - 121 IU/L   AST 29 0 - 40 IU/L   ALT 27 0 - 32 IU/L  POCT HgB A1C     Status: None   Collection Time: 08/07/22 11:16 AM  Result Value Ref Range   Hemoglobin A1C 5.6 4.0 -  5.6 %   HbA1c POC (<> result, manual entry)     HbA1c, POC (prediabetic range)     HbA1c, POC (controlled diabetic range)      PHQ2/9:    08/07/2022   11:00 AM 07/04/2022    2:35 PM 04/01/2022   11:57 AM 12/09/2021    8:50 AM 08/06/2021   11:55 AM  Depression screen PHQ 2/9  Decreased Interest 1 1 1 1 2   Down, Depressed, Hopeless 1 0 1 1 1   PHQ - 2 Score 2 1 2 2 3   Altered sleeping 0  1 1 0  Tired, decreased energy 0  1 2 0  Change in appetite 0  1 1 3   Feeling bad or failure about yourself  1  1 1  0  Trouble concentrating 0  0 0 0  Moving slowly or fidgety/restless 0  0 0 0  Suicidal thoughts 0  0 0 0  PHQ-9 Score 3  6 7 6   Difficult doing work/chores Somewhat difficult  Somewhat difficult Somewhat difficult     phq 9 is positive   Fall Risk:    08/07/2022   11:00 AM 07/04/2022    2:35 PM 04/01/2022   11:57 AM 12/09/2021    8:46 AM 08/06/2021   11:55 AM  Fall Risk   Falls in the past year? 0 0 0 1 0  Number falls in past yr: 0 0  0 0  Injury with Fall? 0 0  1 0  Risk for fall due to : No Fall Risks No Fall Risks No Fall Risks History of fall(s) No Fall Risks  Follow up Falls prevention discussed;Education provided;Falls evaluation completed Falls evaluation completed Falls prevention discussed Falls prevention discussed;Education provided;Falls evaluation completed Falls prevention discussed      Functional Status Survey: Is the patient deaf or have difficulty hearing?: No Does the patient have difficulty seeing, even when wearing glasses/contacts?: No Does the patient have difficulty concentrating, remembering, or making decisions?: Yes Does the patient have difficulty walking or climbing stairs?: No Does the patient have difficulty dressing or bathing?: No Does the patient have difficulty doing errands alone such as visiting a doctor's office or shopping?: No    Assessment & Plan  1. Dyslipidemia associated with type 2 diabetes mellitus (HCC)  - POCT HgB  A1C - Ambulatory referral to Ophthalmology  2. Morbid obesity (HCC)  She will try to get back to the [facility]  3. Hypertension associated with type 2 diabetes mellitus (HCC)  Bp is towards low end of normal, we will cut the losartan dose the week prior to her next visit   4. Vitamin B12 deficiency  On supplementation   5. Mood disorder (HCC)  Worse on winter months due to seasonal affective disorder, continue medications  6. Bilateral lower extremity edema  Improves with activity or elevation  7. Controlled gout  Doing well   8. Vitamin D deficiency  Continue supplementation

## 2022-08-07 ENCOUNTER — Encounter: Payer: Self-pay | Admitting: Family Medicine

## 2022-08-07 ENCOUNTER — Ambulatory Visit (INDEPENDENT_AMBULATORY_CARE_PROVIDER_SITE_OTHER): Payer: PPO | Admitting: Family Medicine

## 2022-08-07 VITALS — BP 118/70 | HR 90 | Temp 97.7°F | Resp 16 | Ht 68.0 in | Wt 249.3 lb

## 2022-08-07 DIAGNOSIS — E538 Deficiency of other specified B group vitamins: Secondary | ICD-10-CM

## 2022-08-07 DIAGNOSIS — F39 Unspecified mood [affective] disorder: Secondary | ICD-10-CM | POA: Diagnosis not present

## 2022-08-07 DIAGNOSIS — E559 Vitamin D deficiency, unspecified: Secondary | ICD-10-CM

## 2022-08-07 DIAGNOSIS — F339 Major depressive disorder, recurrent, unspecified: Secondary | ICD-10-CM | POA: Diagnosis not present

## 2022-08-07 DIAGNOSIS — M109 Gout, unspecified: Secondary | ICD-10-CM | POA: Diagnosis not present

## 2022-08-07 DIAGNOSIS — E1169 Type 2 diabetes mellitus with other specified complication: Secondary | ICD-10-CM | POA: Diagnosis not present

## 2022-08-07 DIAGNOSIS — E782 Mixed hyperlipidemia: Secondary | ICD-10-CM

## 2022-08-07 DIAGNOSIS — Z7985 Long-term (current) use of injectable non-insulin antidiabetic drugs: Secondary | ICD-10-CM | POA: Diagnosis not present

## 2022-08-07 DIAGNOSIS — E1159 Type 2 diabetes mellitus with other circulatory complications: Secondary | ICD-10-CM | POA: Diagnosis not present

## 2022-08-07 DIAGNOSIS — I10 Essential (primary) hypertension: Secondary | ICD-10-CM

## 2022-08-07 DIAGNOSIS — R600 Localized edema: Secondary | ICD-10-CM | POA: Diagnosis not present

## 2022-08-07 DIAGNOSIS — Z7984 Long term (current) use of oral hypoglycemic drugs: Secondary | ICD-10-CM

## 2022-08-07 LAB — POCT GLYCOSYLATED HEMOGLOBIN (HGB A1C): Hemoglobin A1C: 5.6 % (ref 4.0–5.6)

## 2022-08-07 MED ORDER — METFORMIN HCL ER 750 MG PO TB24: 1500.0000 mg | ORAL_TABLET | Freq: Every day | ORAL | 1 refills | Status: DC

## 2022-08-07 MED ORDER — ATORVASTATIN CALCIUM 40 MG PO TABS: 40.0000 mg | ORAL_TABLET | Freq: Every day | ORAL | 1 refills | Status: DC

## 2022-08-07 MED ORDER — ALLOPURINOL 100 MG PO TABS: 100.0000 mg | ORAL_TABLET | Freq: Two times a day (BID) | ORAL | 1 refills | Status: DC

## 2022-08-07 MED ORDER — BUPROPION HCL ER (XL) 300 MG PO TB24: 300.0000 mg | ORAL_TABLET | Freq: Every day | ORAL | 1 refills | Status: DC

## 2022-08-07 MED ORDER — QUETIAPINE FUMARATE 25 MG PO TABS: 25.0000 mg | ORAL_TABLET | Freq: Every day | ORAL | 1 refills | Status: DC

## 2022-08-07 MED ORDER — VENLAFAXINE HCL ER 150 MG PO CP24: ORAL_CAPSULE | ORAL | 1 refills | Status: DC

## 2022-08-07 MED ORDER — LOSARTAN POTASSIUM 100 MG PO TABS: 100.0000 mg | ORAL_TABLET | Freq: Every day | ORAL | 0 refills | Status: DC

## 2022-08-07 MED ORDER — FENOFIBRATE 145 MG PO TABS: 145.0000 mg | ORAL_TABLET | Freq: Every day | ORAL | 1 refills | Status: DC

## 2022-10-02 DIAGNOSIS — H2513 Age-related nuclear cataract, bilateral: Secondary | ICD-10-CM | POA: Diagnosis not present

## 2022-10-02 DIAGNOSIS — H35033 Hypertensive retinopathy, bilateral: Secondary | ICD-10-CM | POA: Diagnosis not present

## 2022-10-02 DIAGNOSIS — E119 Type 2 diabetes mellitus without complications: Secondary | ICD-10-CM | POA: Diagnosis not present

## 2022-12-03 ENCOUNTER — Other Ambulatory Visit: Payer: Self-pay | Admitting: Pharmacist

## 2022-12-03 ENCOUNTER — Encounter: Payer: Self-pay | Admitting: Pharmacist

## 2022-12-03 NOTE — Patient Instructions (Signed)
Goals Addressed             This Visit's Progress    Pharmacy Goals       Please watch the mail for an envelope from [facility] containing the patient assistance program application. Please complete this application and mail back to [facility] Pharmacy Technician Noreene Larsson [place] along with a copy of your Medicare Part D prescription card and a copy of your proof of income document OR you can bring these documents to the office to have them faxed back to Attention: Pattricia Boss at Fax # [phone]   If you need to call Noreene Larsson, you can reach her at [phone]   If you need to reach out to patient assistance programs regarding refills or to find out the status of your application, you can do so by calling:   [employer] at [phone]   Thank you!   Estelle Grumbles, PharmD, [facility] [phone]

## 2022-12-03 NOTE — Progress Notes (Signed)
12/03/2022 Name: Tina Short MRN: [id number] DOB: [birth year]  Chief Complaint  Patient presents with   Medication Assistance    Tina Short is a 67 y.o. year old female who was referred to the pharmacist for assistance in managing medication access.    Subjective:  Care Team: Primary Care Provider: Alba Cory, MD ; Next Scheduled Visit: 12/09/2022   Medication Access/Adherence  Current Pharmacy:  [facility] [id number], [place] - [address] [place] [address] Phone: [phone] Fax: [phone]   Patient reports affordability concerns with their medications: No  Patient reports access/transportation concerns to their pharmacy: No  Patient reports adherence concerns with their medications:  No     Diabetes:  Current medications:  - Ozempic 1 mg weekly  - Metformin ER 750 mg - 2 tablets daily with breakfast  Denies checking home blood sugar recently  Current medication access support: enrolled in patient assistance for Ozempic from [employer] through 02/10/2023 - Reports currently has a sufficient supply of Ozempic to last through end of calendar year   Objective:  Lab Results  Component Value Date   HGBA1C 5.6 08/07/2022    Lab Results  Component Value Date   CREATININE 0.76 08/04/2022   BUN 16 08/04/2022   NA 145 (H) 08/04/2022   K 4.7 08/04/2022   CL 107 (H) 08/04/2022   CO2 23 08/04/2022    Lab Results  Component Value Date   CHOL 139 08/04/2022   HDL 39 (L) 08/04/2022   LDLCALC 72 08/04/2022   TRIG 164 (H) 08/04/2022   CHOLHDL 3.6 08/04/2022    Medications Reviewed Today     Reviewed by Manuela Neptune, RPH-[name] (Pharmacist) on 12/03/22 at 1420  Med List Status: <None>   Medication Order Taking? Sig Documenting Provider Last Dose Status Informant  allopurinol (ZYLOPRIM) 100 MG tablet [id number]  Take 1 tablet (100 mg total) by mouth 2 (two) times daily. Alba Cory, MD  Active    atorvastatin (LIPITOR) 40 MG tablet [id number]  Take 1 tablet (40 mg total) by mouth at bedtime. Alba Cory, MD  Active   blood glucose meter kit and supplies [id number]  Dispense based on patient and insurance preference. Use up to four times daily as directed. (FOR ICD-10 E10.9, E11.9). Alba Cory, MD  Active Self  buPROPion (WELLBUTRIN XL) 300 MG 24 hr tablet [id number]  Take 1 tablet (300 mg total) by mouth daily. TAKE 1 TABLET BY MOUTH EVERY DAY WITH BREAKFAST Sowles, Danna Hefty, MD  Active   Cholecalciferol (VITAMIN D) 50 MCG (2000 UT) CAPS [id number]  Take 1 capsule by mouth daily at 12 noon. [provider]  Active   fenofibrate (TRICOR) 145 MG tablet [id number]  Take 1 tablet (145 mg total) by mouth daily. Alba Cory, MD  Active   losartan (COZAAR) 100 MG tablet [id number]  Take 1 tablet (100 mg total) by mouth daily. Alba Cory, MD  Active   metFORMIN (GLUCOPHAGE-XR) 750 MG 24 hr tablet [id number] Yes Take 2 tablets (1,500 mg total) by mouth daily with breakfast. Alba Cory, MD Taking Active   [facility] ULTRA test strip [id number]  TEST UP TO FOUR TIMES A DAY AS DIRECTED Alba Cory, MD  Active   QUEtiapine (SEROQUEL) 25 MG tablet [id number]  Take 1 tablet (25 mg total) by mouth at bedtime. Alba Cory, MD  Active   Semaglutide, 1 MG/DOSE, 4 MG/3ML Namon Cirri [id number] Yes Inject 1 mg as directed once a week. Alba Cory, MD  Taking Active   venlafaxine XR (EFFEXOR-XR) 150 MG 24 hr capsule [id number]  TAKE 1 CAPSULE(150 MG) BY MOUTH DAILY WITH Iona Coach, Danna Hefty, MD  Active   vitamin B-12 (CYANOCOBALAMIN) 500 MCG tablet [id number]  Take 500 mcg by mouth daily. [provider]  Active               Assessment/Plan:   Diabetes: - Currently controlled - Discuss importance of having regular well-balanced meals and snacks throughout the day, while  - Will collaborate with provider, CPhT to aid patient with pursuing re-enrollment for Ozempic  patient assistance from [employer] for 2025 calendar year  Follow Up Plan: Clinical Pharmacist will follow up with patient by telephone on 03/04/2023 at 3:00 PM   Estelle Grumbles, PharmD, [facility] [phone]

## 2022-12-05 ENCOUNTER — Other Ambulatory Visit: Payer: Self-pay | Admitting: Family Medicine

## 2022-12-05 DIAGNOSIS — E1169 Type 2 diabetes mellitus with other specified complication: Secondary | ICD-10-CM

## 2022-12-08 ENCOUNTER — Ambulatory Visit: Payer: PPO | Admitting: Family Medicine

## 2022-12-08 NOTE — Progress Notes (Unsigned)
Name: Tina Short   MRN: [id number]    DOB: 02-08-1956   Date:12/09/2022       Progress Note  Subjective  Chief Complaint  Follow Up  HPI  DMII: she has a history of DM for many years, last A1C was 7.3 % while on Metformin but was out of Ozempic , last visit she was on Ozempic 0.5 mg and A1C went down to  6.7 %, we adjusted dose of Ozempic to 1 mg and today A1C is  5.8 %  . She states mouth is always dry and needs to drink water all the time. She takes statin and Tricor ( unable to afford Lovaza )  Losartan ( [facility]) for microabuminuria She has neuropathy but doing well even without gabapentin.    Morbid obesity: BMI over 35 with co-morbidities such as DM, HTN and OA, she is doing very well on  GLP-1 agonist, currently on 1 mg dose and lost 13 lbs since her visit Feb 2024. Weight is down from 262 lbs , weight has been stable between 249-250 lbs since Summer 2024  She states medication curbs her appetite , she craves carbohydrates when off medication. Stable    Major Depression: Her father committed suicide in his 60's and there are other family members with history of psychiatric illness. She denies suicidal thoughts, but would be fine if she died, she states that is a chronic feeling, she states she would never do it. No planning. She still feels hurt about the break down of her previous church - where she was a member for almost 50 years. Not doing well since Spring 2022 , we added Wellbutrin and also seroquel for sleep. Sleeping 6-8 hours per night . She states medication is working well for her   OSA: she uses CPAP every night otherwise she wakes up feeling shaky. She is compliant even after recent fall    B12 deficiency: she is taking supplements and last level mid 500's , she has been out of medication for the past couple of weeks but she will resume taking it    Gout: no episodes since taking Allopurinol bid. Continue medication as prescribed    Vitamin D: she is taking otc  supplementation .    HTN: she is taking medications, no chest pain, palpitation or dizziness  BP is at goal, we had advised her to cut losartan in half but she forgot and bp today is 128/70, continue current regiment   OA knee: worse on right side, daily pain, worse with activity or standing, she was seen at [facility] , had left knee replacement 10/03/2020 , she does not want to have replacement on right side yet , had increase in pain after recent fall but is getting back to baseline   DDD cervical spine: per patient neurosurgeon thinks the cause of her choking and coughing, but no need for intervention at this time. She states symptoms have improved   Recent Fall: she tripped on a rug while carrying items on both hands, hit her face and anterior chest on a box that was on the floor. It happened on 10/25, she is feeling better, she developed pain on left shoulder but has normal abduction now, right knee was very sore but is improving, new floaters on right eye but is getting smaller. Bruise on chest wall and also scrapped her nose but all symptoms are gradually improving. Discussed importance of going to [facility] if future falls with head trauma   Patient  Active Problem List   Diagnosis Date Noted   History of total knee replacement, left 10/16/2020   Esophageal dysphagia    Major depression, recurrent, chronic (HCC) 10/30/2015   Dyslipidemia associated with type 2 diabetes mellitus (HCC) 10/30/2015   Vitamin D deficiency 07/27/2015   Vitamin B12 deficiency 07/27/2015   History of anemia 09/12/2014   Carpal tunnel syndrome 09/12/2014   Obstructive sleep apnea 09/12/2014   Edema of extremities 09/12/2014   Controlled gout 09/12/2014   Morbid obesity (HCC) 11/15/2009   Benign essential HTN 10/07/2006   Type 2 diabetes mellitus, uncontrolled, with neuropathy 10/07/2006   Hyperlipidemia 10/07/2006    Past Surgical History:  Procedure Laterality Date   ABDOMINAL HYSTERECTOMY     COLONOSCOPY      COLONOSCOPY WITH PROPOFOL N/A 05/14/2016   Procedure: COLONOSCOPY WITH PROPOFOL;  Surgeon: Earline Mayotte, MD;  Location: [facility] ENDOSCOPY;  Service: Endoscopy;  Laterality: N/A;   COLONOSCOPY WITH PROPOFOL N/A 04/15/2018   Procedure: COLONOSCOPY WITH PROPOFOL;  Surgeon: Toney Reil, MD;  Location: [facility] ENDOSCOPY;  Service: Gastroenterology;  Laterality: N/A;   ESOPHAGOGASTRODUODENOSCOPY (EGD) WITH PROPOFOL N/A 04/15/2018   Procedure: ESOPHAGOGASTRODUODENOSCOPY (EGD) WITH PROPOFOL;  Surgeon: Toney Reil, MD;  Location: [facility] ENDOSCOPY;  Service: Gastroenterology;  Laterality: N/A;   FOOT SURGERY Right    X2   SHOULDER SURGERY Right    TOTAL KNEE ARTHROPLASTY Left    Dr. Odis Luster at [facility]   WRIST SURGERY Bilateral     Family History  Problem Relation Age of Onset   Diabetes Mother    Cancer Mother        uterine   Mental illness Father    Obesity Daughter    Diabetes Maternal Grandmother    Heart disease Brother    Obesity Daughter     Social History   Tobacco Use   Smoking status: Never   Smokeless tobacco: Never  Substance Use Topics   Alcohol use: No    Alcohol/week: 0.0 standard drinks of alcohol     Current Outpatient Medications:    allopurinol (ZYLOPRIM) 100 MG tablet, Take 1 tablet (100 mg total) by mouth 2 (two) times daily., Disp: 180 tablet, Rfl: 1   atorvastatin (LIPITOR) 40 MG tablet, Take 1 tablet (40 mg total) by mouth at bedtime., Disp: 90 tablet, Rfl: 1   blood glucose meter kit and supplies, Dispense based on patient and insurance preference. Use up to four times daily as directed. (FOR ICD-10 E10.9, E11.9)., Disp: 1 each, Rfl: 0   buPROPion (WELLBUTRIN XL) 300 MG 24 hr tablet, Take 1 tablet (300 mg total) by mouth daily. TAKE 1 TABLET BY MOUTH EVERY DAY WITH BREAKFAST, Disp: 90 tablet, Rfl: 1   Cholecalciferol (VITAMIN D) 50 MCG (2000 UT) CAPS, Take 1 capsule by mouth daily at 12 noon., Disp: , Rfl:    fenofibrate (TRICOR) 145  MG tablet, Take 1 tablet (145 mg total) by mouth daily., Disp: 90 tablet, Rfl: 1   losartan (COZAAR) 100 MG tablet, Take 1 tablet (100 mg total) by mouth daily., Disp: 90 tablet, Rfl: 0   metFORMIN (GLUCOPHAGE-XR) 750 MG 24 hr tablet, Take 2 tablets (1,500 mg total) by mouth daily with breakfast., Disp: 180 tablet, Rfl: 1   ONETOUCH ULTRA test strip, TEST UP TO FOUR TIMES A DAY AS DIRECTED, Disp: 300 strip, Rfl: 0   QUEtiapine (SEROQUEL) 25 MG tablet, Take 1 tablet (25 mg total) by mouth at bedtime., Disp: 90 tablet, Rfl: 1  Semaglutide, 1 MG/DOSE, 4 MG/3ML SOPN, Inject 1 mg as directed once a week., Disp: 9 mL, Rfl: 3   venlafaxine XR (EFFEXOR-XR) 150 MG 24 hr capsule, TAKE 1 CAPSULE(150 MG) BY MOUTH DAILY WITH BREAKFAST, Disp: 90 capsule, Rfl: 1   vitamin B-12 (CYANOCOBALAMIN) 500 MCG tablet, Take 500 mcg by mouth daily., Disp: , Rfl:   Allergies  Allergen Reactions   Abilify [Aripiprazole] Shortness Of Breath    Other reaction(s): Difficulty breathing   Invokana [Canagliflozin] Itching    Yeast infections   Metformin And Related     Diarrhea, but doing well with XR formulation     Lovaza [Omega-3-Acid Ethyl Esters] Other (See Comments)    Burping     I personally reviewed active problem list, medication list, allergies, family history, social history, health maintenance with the patient/caregiver today.   ROS  Constitutional: Negative for fever or weight change.  Respiratory: Negative for cough and shortness of breath.   Cardiovascular: Negative for chest pain or palpitations.  Gastrointestinal: Negative for abdominal pain, no bowel changes.  Musculoskeletal: Negative for gait problem or joint swelling.  Skin: positive for bruises Neurological: Negative for dizziness or headache.  No other specific complaints in a complete review of systems (except as listed in HPI above).   Objective  Vitals:   12/09/22 1133  BP: 128/70  Pulse: 92  Resp: 16  SpO2: 96%  Weight: 250 lb  (113.4 kg)  Height: 5\' 8"  (1.727 m)    Body mass index is 38.01 kg/m.  Physical Exam  Constitutional: Patient appears well-developed and well-nourished. Obese  No distress.  HEENT: head atraumatic, normocephalic, pupils equal and reactive to light, neck supple Cardiovascular: Normal rate, regular rhythm and normal heart sounds.  No murmur heard. Trace  BLE edema. Pulmonary/Chest: Effort normal and breath sounds normal. No respiratory distress. Abdominal: Soft.  There is no tenderness. Skin: excoriation face and bruise on anterior wall  Psychiatric: Patient has a normal mood and affect. behavior is normal. Judgment and thought content normal.   Recent Results (from the past 2160 hour(s))  POCT HgB A1C     Status: Abnormal   Collection Time: 12/09/22 11:34 AM  Result Value Ref Range   Hemoglobin A1C 5.8 (A) 4.0 - 5.6 %   HbA1c POC (<> result, manual entry)     HbA1c, POC (prediabetic range)     HbA1c, POC (controlled diabetic range)       PHQ2/9:    12/09/2022   11:33 AM 08/07/2022   11:00 AM 07/04/2022    2:35 PM 04/01/2022   11:57 AM 12/09/2021    8:50 AM  Depression screen PHQ 2/9  Decreased Interest 0 1 1 1 1   Down, Depressed, Hopeless 0 1 0 1 1  PHQ - 2 Score 0 2 1 2 2   Altered sleeping 0 0  1 1  Tired, decreased energy 0 0  1 2  Change in appetite 0 0  1 1  Feeling bad or failure about yourself  0 1  1 1   Trouble concentrating 0 0  0 0  Moving slowly or fidgety/restless 0 0  0 0  Suicidal thoughts 0 0  0 0  PHQ-9 Score 0 3  6 7   Difficult doing work/chores  Somewhat difficult  Somewhat difficult Somewhat difficult    phq 9 is negative   Fall Risk:    12/09/2022   11:33 AM 08/07/2022   11:00 AM 07/04/2022    2:35 PM 04/01/2022  11:57 AM 12/09/2021    8:46 AM  Fall Risk   Falls in the past year? 1 0 0 0 1  Number falls in past yr: 0 0 0  0  Injury with Fall? 1 0 0  1  Risk for fall due to : No Fall Risks No Fall Risks No Fall Risks No Fall Risks History  of fall(s)  Follow up Falls prevention discussed Falls prevention discussed;Education provided;Falls evaluation completed Falls evaluation completed Falls prevention discussed Falls prevention discussed;Education provided;Falls evaluation completed      Functional Status Survey: Is the patient deaf or have difficulty hearing?: No Does the patient have difficulty seeing, even when wearing glasses/contacts?: No Does the patient have difficulty concentrating, remembering, or making decisions?: No Does the patient have difficulty walking or climbing stairs?: No Does the patient have difficulty dressing or bathing?: No Does the patient have difficulty doing errands alone such as visiting a doctor's office or shopping?: No    Assessment & Plan  1. Dyslipidemia associated with type 2 diabetes mellitus (HCC)  - POCT HgB A1C  2. Major depression, recurrent, chronic (HCC)  Improved   3. Mood disorder (HCC)  Doing well   4. Morbid obesity (HCC)  Discussed with the patient the risk posed by an increased BMI. Discussed importance of portion control, calorie counting and at least 150 minutes of physical activity weekly. Avoid sweet beverages and drink more water. Eat at least 6 servings of fruit and vegetables daily    5. Hypertension associated with type 2 diabetes mellitus (HCC)  BP is at goal   6. Vitamin D deficiency  On supplements   7. Vitreous floaters of right eye  Advised a visit with ophthalmologist   8. History of recent fall  Discussed Fall prevention  9. Controlled gout  Continue medication   10. Vitamin B12 deficiency  Resume medications  11. Benign essential HTN  - losartan (COZAAR) 100 MG tablet; Take 1 tablet (100 mg total) by mouth daily.  Dispense: 90 tablet; Refill: 0

## 2022-12-09 ENCOUNTER — Encounter: Payer: Self-pay | Admitting: Family Medicine

## 2022-12-09 ENCOUNTER — Ambulatory Visit (INDEPENDENT_AMBULATORY_CARE_PROVIDER_SITE_OTHER): Payer: PPO | Admitting: Family Medicine

## 2022-12-09 ENCOUNTER — Telehealth: Payer: Self-pay | Admitting: Pharmacy Technician

## 2022-12-09 VITALS — BP 128/70 | HR 92 | Resp 16 | Ht 68.0 in | Wt 250.0 lb

## 2022-12-09 DIAGNOSIS — Z9181 History of falling: Secondary | ICD-10-CM

## 2022-12-09 DIAGNOSIS — M109 Gout, unspecified: Secondary | ICD-10-CM | POA: Diagnosis not present

## 2022-12-09 DIAGNOSIS — F339 Major depressive disorder, recurrent, unspecified: Secondary | ICD-10-CM | POA: Diagnosis not present

## 2022-12-09 DIAGNOSIS — E538 Deficiency of other specified B group vitamins: Secondary | ICD-10-CM

## 2022-12-09 DIAGNOSIS — E559 Vitamin D deficiency, unspecified: Secondary | ICD-10-CM | POA: Diagnosis not present

## 2022-12-09 DIAGNOSIS — I152 Hypertension secondary to endocrine disorders: Secondary | ICD-10-CM

## 2022-12-09 DIAGNOSIS — E1169 Type 2 diabetes mellitus with other specified complication: Secondary | ICD-10-CM

## 2022-12-09 DIAGNOSIS — E1159 Type 2 diabetes mellitus with other circulatory complications: Secondary | ICD-10-CM

## 2022-12-09 DIAGNOSIS — E785 Hyperlipidemia, unspecified: Secondary | ICD-10-CM

## 2022-12-09 DIAGNOSIS — F39 Unspecified mood [affective] disorder: Secondary | ICD-10-CM | POA: Diagnosis not present

## 2022-12-09 DIAGNOSIS — Z23 Encounter for immunization: Secondary | ICD-10-CM

## 2022-12-09 DIAGNOSIS — H43391 Other vitreous opacities, right eye: Secondary | ICD-10-CM

## 2022-12-09 DIAGNOSIS — I10 Essential (primary) hypertension: Secondary | ICD-10-CM

## 2022-12-09 DIAGNOSIS — Z5986 Financial insecurity: Secondary | ICD-10-CM

## 2022-12-09 LAB — POCT GLYCOSYLATED HEMOGLOBIN (HGB A1C): Hemoglobin A1C: 5.8 % — AB (ref 4.0–5.6)

## 2022-12-09 MED ORDER — LOSARTAN POTASSIUM 100 MG PO TABS: 100.0000 mg | ORAL_TABLET | Freq: Every day | ORAL | 0 refills | Status: DC

## 2022-12-09 NOTE — Progress Notes (Signed)
[facility] Customer service manager [facility])                                            [facility] Quality Pharmacy Team    12/09/2022  Tina Short Mar 19, 1955 [id number]                                      Medication Assistance Referral  Referral From:  [facility] PharmD Estelle Grumbles  Medication/Company: Franki Monte / Novo Nordisk Patient application portion:  Mailed Provider application portion: Faxed  to Dr. Alba Cory Provider address/fax verified via: Office website  Pattricia Boss, CPhT [place]  Office: [phone] Fax: [phone] Email: [email or web address]

## 2022-12-11 ENCOUNTER — Telehealth: Payer: Self-pay | Admitting: Family Medicine

## 2022-12-11 NOTE — Telephone Encounter (Signed)
The patient called in per her providers request to provide information on the anti fungal powder she has taken previously and needs a new prescription for. She said it is called Microguard Powder with Miconazole Nitrate 2%. Please assist patient further as she uses    [id number] [place], [place] - [address] Phone: [phone]  Fax: [phone]

## 2022-12-12 ENCOUNTER — Other Ambulatory Visit: Payer: Self-pay | Admitting: Family Medicine

## 2022-12-12 ENCOUNTER — Other Ambulatory Visit: Payer: Self-pay

## 2022-12-12 DIAGNOSIS — L304 Erythema intertrigo: Secondary | ICD-10-CM

## 2022-12-12 MED ORDER — MICONAZOLE NITRATE 2 % EX CREA
1.0000 | TOPICAL_CREAM | Freq: Two times a day (BID) | CUTANEOUS | 0 refills | Status: AC
Start: 2022-12-12 — End: ?

## 2023-01-15 ENCOUNTER — Telehealth: Payer: Self-pay | Admitting: Family Medicine

## 2023-01-15 NOTE — Telephone Encounter (Signed)
Pt spouse brought back paperwork and faxed it again. Confirmed paperwork was sent correctly and made copies in case something went wrong.

## 2023-01-15 NOTE — Telephone Encounter (Signed)
Noreene Larsson, with [facility], alongside Lanora Manis (Pharmacist) recieved a fax yesterday 01/14/23 as patient dropped off [employer] paperwork with financial documents attached. Per Noreene Larsson, they only received half pages of each page of the 9 page fax, only the top half of each page is showing. This needs to be re-faxed to fax # [phone]. Please advise.  Jill's callback # [phone]

## 2023-01-15 NOTE — Telephone Encounter (Signed)
Spoke to front desk and was told they faxed but did not make a copy. I called pt to advised to bring paperwork back to re fax it again. Pt verbalized understanding

## 2023-01-23 ENCOUNTER — Telehealth: Payer: Self-pay | Admitting: Pharmacy Technician

## 2023-01-23 DIAGNOSIS — Z5986 Financial insecurity: Secondary | ICD-10-CM

## 2023-01-23 NOTE — Progress Notes (Addendum)
Pharmacy Medication Assistance Program Note    01/23/2023  Patient ID: ZHANE DONLAN, female   DOB: [birth year], 67 y.o.   MRN: [id number]     01/23/2023  Outreach Medication One  Initial Outreach Date (Medication One) 12/09/2022  Manufacturer Medication One [employer] [facility] Ozempic  Dose of Ozempic 4mg /61ml  Type of Radiographer, therapeutic Assistance  Date Application Sent to Patient 12/10/2022  Application Items Requested Application;Proof of Income;Other  Date Application Sent to Prescriber 12/10/2022  Name of Prescriber Alba Cory  Date Application Received From Patient 01/13/2023  Application Items Received From Patient Application;Proof of Income;Other  Date Application Received From Provider 12/12/2022  Date Application Submitted to Manufacturer 01/22/2023   ADDENDUM 02/16/23 Care coordination call placed to [employer] in regard to Ozempic application. Spoke to representative who informs patient's current enrollment does not end until 03/29/23. She is not eligible to reapply until 30 days prior to that date. Therefore, the application that was submitted on 01/22/23 will have to be re submitted sometime on or after 02/26/23.   ADDENDUM 03/12/23 Application was resubmitted to [employer] for [employer] application.  Signature Kristopher Glee [facility]  Office: [phone] Fax: [phone] Email: [email or web address]

## 2023-01-28 ENCOUNTER — Encounter: Payer: Self-pay | Admitting: Family Medicine

## 2023-01-28 NOTE — Telephone Encounter (Signed)
 Care team updated and letter sent for eye exam notes.

## 2023-03-04 ENCOUNTER — Other Ambulatory Visit: Payer: PPO | Admitting: Pharmacist

## 2023-03-16 ENCOUNTER — Telehealth: Payer: Self-pay | Admitting: Pharmacy Technician

## 2023-03-16 DIAGNOSIS — Z5986 Financial insecurity: Secondary | ICD-10-CM

## 2023-03-16 NOTE — Progress Notes (Signed)
Pharmacy Medication Assistance Program Note    03/16/2023  Patient ID: Tina Short, female   DOB: April 02, 1955, 68 y.o.   MRN: [id number]     01/23/2023 03/16/2023  Outreach Medication One  Initial Outreach Date (Medication One) 12/09/2022   Manufacturer Medication One [employer] [facility] Ozempic   Dose of Ozempic 4mg /36ml   Type of Radiographer, therapeutic Assistance   Date Application Sent to Patient 12/10/2022   Application Items Requested Application;Proof of Income;Other   Date Application Sent to Prescriber 12/10/2022   Name of Prescriber Alba Cory   Date Application Received From Patient 01/13/2023 03/11/2023  Application Items Received From Patient Application;Proof of Income;Other Application;Proof of Income;Other  Date Application Received From Provider 12/12/2022 12/11/2022  Date Application Submitted to Manufacturer 01/22/2023 03/11/2023  Method Application Sent to Manufacturer  Fax  Patient Assistance Determination  Approved  Approval Start Date  03/16/2023  Approval End Date  03/24/2024  Patient Notification Method  Telephone Call   Care coordination call placed to [employer] in regard to Ozempic application.  Per IVR system, patient is APPROVED 03/16/23-03/24/24. Medication will auto process and ship to prescriber's office based on last fill date in 2024. Patient may call Novo [employer] at any time to check on next shipment by calling [phone].  Successful outreach to patient. HIPAA verified. Informed patient of her approval dates and reminded patient of refill procedure. Patient verbalized understanding.  Pattricia Boss, CPhT [place]  Office: [phone] Fax: [phone] Email: [email or web address]

## 2023-03-25 ENCOUNTER — Other Ambulatory Visit: Payer: Self-pay | Admitting: Pharmacist

## 2023-03-25 DIAGNOSIS — I10 Essential (primary) hypertension: Secondary | ICD-10-CM

## 2023-03-25 NOTE — Progress Notes (Signed)
03/25/2023 Name: TALECIA SHERLIN MRN: [id number] DOB: [birth year]  Chief Complaint  Patient presents with   Medication Assistance    EIZA CANNIFF is a 68 y.o. year old female who presented for a telephone visit.   Kailia Starry Lanting is a 68 y.o. year old female who was referred to the pharmacist for assistance in managing medication access.      Subjective:   Care Team: Primary Care Provider: Alba Cory, MD ; Next Scheduled Visit: 06/17/2023    Medication Access/Adherence  [phone] [place], [place] - [address] [place] [address] Phone: [phone] Fax: [phone]   Patient reports affordability concerns with their medications: No  Patient reports access/transportation concerns to their pharmacy: No  Patient reports adherence concerns with their medications:  No       Diabetes:   Current medications:  - Ozempic 1 mg weekly  - Metformin ER 750 mg - 2 tablets daily with breakfast   Denies checking home blood sugar recently   Current medication access support: enrolled in patient assistance for Ozempic from [employer] through 02/10/2024     Objective:  Lab Results  Component Value Date   HGBA1C 5.8 (A) 12/09/2022    Lab Results  Component Value Date   CREATININE 0.76 08/04/2022   BUN 16 08/04/2022   NA 145 (H) 08/04/2022   K 4.7 08/04/2022   CL 107 (H) 08/04/2022   CO2 23 08/04/2022     Medications Reviewed Today     Reviewed by Manuela Neptune, RPH-[name] (Pharmacist) on 03/25/23 at 1502  Med List Status: <None>   Medication Order Taking? Sig Documenting Provider Last Dose Status Informant  allopurinol (ZYLOPRIM) 100 MG tablet [id number]  Take 1 tablet (100 mg total) by mouth 2 (two) times daily. Alba Cory, MD  Active   atorvastatin (LIPITOR) 40 MG tablet [id number]  Take 1 tablet (40 mg total) by mouth at bedtime. Alba Cory, MD  Active   blood glucose meter kit and supplies  [id number]  Dispense based on patient and insurance preference. Use up to four times daily as directed. (FOR ICD-10 E10.9, E11.9). Alba Cory, MD  Active Self  buPROPion (WELLBUTRIN XL) 300 MG 24 hr tablet [id number]  Take 1 tablet (300 mg total) by mouth daily. TAKE 1 TABLET BY MOUTH EVERY DAY WITH BREAKFAST Sowles, Danna Hefty, MD  Active   Cholecalciferol (VITAMIN D) 50 MCG (2000 UT) CAPS [id number]  Take 1 capsule by mouth daily at 12 noon. [provider]  Active   fenofibrate (TRICOR) 145 MG tablet [id number]  Take 1 tablet (145 mg total) by mouth daily. Alba Cory, MD  Active   losartan (COZAAR) 100 MG tablet [id number]  Take 1 tablet (100 mg total) by mouth daily. Alba Cory, MD  Active   metFORMIN (GLUCOPHAGE-XR) 750 MG 24 hr tablet [id number] Yes TAKE 2 TABLETS(1500 MG) BY MOUTH DAILY WITH Iona Coach, Danna Hefty, MD Taking Active   miconazole (MICATIN) 2 % cream [id number]  Apply 1 Application topically 2 (two) times daily. Alba Cory, MD  Active   [facility] ULTRA test strip [id number]  TEST UP TO FOUR TIMES A DAY AS DIRECTED Alba Cory, MD  Active   QUEtiapine (SEROQUEL) 25 MG tablet [id number]  Take 1 tablet (25 mg total) by mouth at bedtime. Alba Cory, MD  Active   Semaglutide, 1 MG/DOSE, 4 MG/3ML Namon Cirri [id number] Yes Inject 1 mg as directed once a week. Alba Cory, MD Taking  Active   venlafaxine XR (EFFEXOR-XR) 150 MG 24 hr capsule [id number]  TAKE 1 CAPSULE(150 MG) BY MOUTH DAILY WITH Iona Coach, Danna Hefty, MD  Active   vitamin B-12 (CYANOCOBALAMIN) 500 MCG tablet [id number]  Take 500 mcg by mouth daily. [provider]  Active               Assessment/Plan:   Diabetes: - Currently controlled - Patient to follow up with Novo Nordisk as needed for refills of Ozempic   Follow Up Plan: Clinical Pharmacist will follow up with patient by telephone on 11/25/2023 at 3:30 PM     Estelle Grumbles, PharmD, [facility] [phone]

## 2023-03-25 NOTE — Patient Instructions (Addendum)
Goals Addressed             This Visit's Progress    Pharmacy Goals        If you need to reach out to patient assistance programs regarding refills or to find out the status of your application, you can do so by calling:   [employer] at [phone]  Thank you!   Estelle Grumbles, PharmD, [facility] [phone]

## 2023-04-11 ENCOUNTER — Other Ambulatory Visit: Payer: Self-pay | Admitting: Family Medicine

## 2023-04-11 DIAGNOSIS — I10 Essential (primary) hypertension: Secondary | ICD-10-CM

## 2023-04-11 DIAGNOSIS — F339 Major depressive disorder, recurrent, unspecified: Secondary | ICD-10-CM

## 2023-04-13 NOTE — Telephone Encounter (Signed)
 Last f/u 11/2022

## 2023-04-21 ENCOUNTER — Other Ambulatory Visit: Payer: Self-pay | Admitting: Family Medicine

## 2023-04-21 DIAGNOSIS — E782 Mixed hyperlipidemia: Secondary | ICD-10-CM

## 2023-04-21 DIAGNOSIS — F339 Major depressive disorder, recurrent, unspecified: Secondary | ICD-10-CM

## 2023-04-21 DIAGNOSIS — E1169 Type 2 diabetes mellitus with other specified complication: Secondary | ICD-10-CM

## 2023-06-13 ENCOUNTER — Other Ambulatory Visit: Payer: Self-pay | Admitting: Family Medicine

## 2023-06-13 DIAGNOSIS — F339 Major depressive disorder, recurrent, unspecified: Secondary | ICD-10-CM

## 2023-06-17 ENCOUNTER — Encounter: Payer: Self-pay | Admitting: Family Medicine

## 2023-06-17 ENCOUNTER — Ambulatory Visit: Payer: Self-pay | Admitting: Family Medicine

## 2023-06-17 VITALS — BP 114/70 | HR 97 | Resp 16 | Ht 68.0 in | Wt 233.4 lb

## 2023-06-17 DIAGNOSIS — E782 Mixed hyperlipidemia: Secondary | ICD-10-CM | POA: Diagnosis not present

## 2023-06-17 DIAGNOSIS — E538 Deficiency of other specified B group vitamins: Secondary | ICD-10-CM

## 2023-06-17 DIAGNOSIS — E1159 Type 2 diabetes mellitus with other circulatory complications: Secondary | ICD-10-CM | POA: Diagnosis not present

## 2023-06-17 DIAGNOSIS — E559 Vitamin D deficiency, unspecified: Secondary | ICD-10-CM

## 2023-06-17 DIAGNOSIS — M109 Gout, unspecified: Secondary | ICD-10-CM | POA: Diagnosis not present

## 2023-06-17 DIAGNOSIS — E785 Hyperlipidemia, unspecified: Secondary | ICD-10-CM | POA: Diagnosis not present

## 2023-06-17 DIAGNOSIS — I152 Hypertension secondary to endocrine disorders: Secondary | ICD-10-CM

## 2023-06-17 DIAGNOSIS — F39 Unspecified mood [affective] disorder: Secondary | ICD-10-CM | POA: Diagnosis not present

## 2023-06-17 DIAGNOSIS — E1169 Type 2 diabetes mellitus with other specified complication: Secondary | ICD-10-CM

## 2023-06-17 DIAGNOSIS — F339 Major depressive disorder, recurrent, unspecified: Secondary | ICD-10-CM

## 2023-06-17 LAB — POCT GLYCOSYLATED HEMOGLOBIN (HGB A1C): Hemoglobin A1C: 5.6 % (ref 4.0–5.6)

## 2023-06-17 MED ORDER — QUETIAPINE FUMARATE 25 MG PO TABS: 25.0000 mg | ORAL_TABLET | Freq: Every day | ORAL | 1 refills | Status: DC

## 2023-06-17 MED ORDER — ALLOPURINOL 100 MG PO TABS: 100.0000 mg | ORAL_TABLET | Freq: Two times a day (BID) | ORAL | 1 refills | Status: DC

## 2023-06-17 MED ORDER — METFORMIN HCL ER 750 MG PO TB24: 750.0000 mg | ORAL_TABLET | Freq: Every day | ORAL | 1 refills | Status: DC

## 2023-06-17 MED ORDER — BUPROPION HCL ER (XL) 300 MG PO TB24: 300.0000 mg | ORAL_TABLET | Freq: Every morning | ORAL | 1 refills | Status: DC

## 2023-06-17 MED ORDER — ATORVASTATIN CALCIUM 40 MG PO TABS: 40.0000 mg | ORAL_TABLET | Freq: Every evening | ORAL | 1 refills | Status: DC

## 2023-06-17 MED ORDER — VITAMIN D 50 MCG (2000 UT) PO CAPS: 1.0000 | ORAL_CAPSULE | Freq: Every day | ORAL | 1 refills | Status: DC

## 2023-06-17 MED ORDER — FENOFIBRATE 145 MG PO TABS
145.0000 mg | ORAL_TABLET | Freq: Every day | ORAL | 1 refills | Status: DC
Start: 2023-06-17 — End: 2023-12-18

## 2023-06-17 MED ORDER — VENLAFAXINE HCL ER 150 MG PO CP24: 150.0000 mg | ORAL_CAPSULE | Freq: Every morning | ORAL | 1 refills | Status: DC

## 2023-06-17 NOTE — Progress Notes (Signed)
 Name: Tina Short   MRN: [id number]    DOB: 05/24/1955   Date:06/17/2023       Progress Note  Subjective  Chief Complaint  Chief Complaint  Patient presents with   Medical Management of Chronic Issues   Discussed the use of AI scribe software for clinical note transcription with the patient, who gave verbal consent to proceed.  History of Present Illness Tina Short is a 68 year old female with diabetes, hyperlipidemia, gout, and mood disorder who presents for a follow-up visit.  She has a long history of diabetes. Her A1c had previously increased to 7.3% when she was off Ozempic , but since resuming Ozempic  at 1 mg and taking metformin  750 mg twice daily, her A1c has improved to 5.6%. Her blood sugar levels are approximately 127 mg/dL when checked. Denies polyphagia, polydipsia or polyuria   For hyperlipidemia, she is taking atorvastatin  40 mg and fenofibrate  145 mg. She continues her current regimen without changes.  She has not experienced any recent gout attacks and continues to take allopurinol  twice daily for management.  She has a history of major depression and mood disorder. She is currently on Wellbutrin  300 mg, quetiapine  for sleep, and venlafaxine  150 mg. 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 She describes her home life as 'boring' and is seeking ways to engage more with the community. Father committed suicide but she denies suicidal ideation   She uses a CPAP machine nightly for sleep apnea without issues. She also reports arthritis in her right knee, which worsens with prolonged standing or walking up steps. Her left knee was replaced in 2022.  She is not currently taking vitamin B12 due to cost concerns but plans to resume it twice a week. She is exploring options for more affordable supplements.    Patient Active Problem List   Diagnosis Date Noted    History of total knee replacement, left 10/16/2020   Esophageal dysphagia    Major depression, recurrent, chronic (HCC) 10/30/2015   Dyslipidemia associated with type 2 diabetes mellitus (HCC) 10/30/2015   Vitamin D  deficiency 07/27/2015   Vitamin B12 deficiency 07/27/2015   History of anemia 09/12/2014   Carpal tunnel syndrome 09/12/2014   Obstructive sleep apnea 09/12/2014   Edema of extremities 09/12/2014   Controlled gout 09/12/2014   Morbid obesity (HCC) 11/15/2009   Benign essential HTN 10/07/2006   Type 2 diabetes mellitus, uncontrolled, with neuropathy 10/07/2006   Hyperlipidemia 10/07/2006    Past Surgical History:  Procedure Laterality Date   ABDOMINAL HYSTERECTOMY     COLONOSCOPY     COLONOSCOPY WITH PROPOFOL  N/A 05/14/2016   Procedure: COLONOSCOPY WITH PROPOFOL ;  Surgeon: Marshall Skeeter, MD;  Location: [facility] ENDOSCOPY;  Service: Endoscopy;  Laterality: N/A;   COLONOSCOPY WITH PROPOFOL  N/A 04/15/2018   Procedure: COLONOSCOPY WITH PROPOFOL ;  Surgeon: Selena Daily, MD;  Location: [facility] ENDOSCOPY;  Service: Gastroenterology;  Laterality: N/A;   ESOPHAGOGASTRODUODENOSCOPY (EGD) WITH PROPOFOL  N/A 04/15/2018   Procedure: ESOPHAGOGASTRODUODENOSCOPY (EGD) WITH PROPOFOL ;  Surgeon: Selena Daily, MD;  Location: [facility] ENDOSCOPY;  Service: Gastroenterology;  Laterality: N/A;   FOOT SURGERY Right    X2   SHOULDER SURGERY Right    TOTAL KNEE ARTHROPLASTY Left    Dr. Hobart Lulas at [facility]   WRIST SURGERY Bilateral     Family History  Problem Relation Age of Onset   Diabetes Mother  Cancer Mother        uterine   Mental illness Father    Obesity Daughter    Diabetes Maternal Grandmother    Heart disease Brother    Obesity Daughter     Social History   Tobacco Use   Smoking status: Never   Smokeless tobacco: Never  Substance Use Topics   Alcohol use: No    Alcohol/week: 0.0 standard drinks of alcohol     Current Outpatient Medications:     allopurinol  (ZYLOPRIM ) 100 MG tablet, Take 1 tablet (100 mg total) by mouth 2 (two) times daily., Disp: 180 tablet, Rfl: 1   atorvastatin  (LIPITOR) 40 MG tablet, TAKE 1 TABLET BY MOUTH EVERY EVENING, Disp: 90 tablet, Rfl: 0   blood glucose meter kit and supplies, Dispense based on patient and insurance preference. Use up to four times daily as directed. (FOR ICD-10 E10.9, E11.9)., Disp: 1 each, Rfl: 0   buPROPion  (WELLBUTRIN  XL) 300 MG 24 hr tablet, TAKE 1 TABLET BY MOUTH EVERY MORNING, Disp: 30 tablet, Rfl: 0   Cholecalciferol (VITAMIN D ) 50 MCG (2000 UT) CAPS, Take 1 capsule by mouth daily at 12 noon., Disp: , Rfl:    fenofibrate  (TRICOR ) 145 MG tablet, Take 1 tablet (145 mg total) by mouth daily., Disp: 90 tablet, Rfl: 1   losartan  (COZAAR ) 100 MG tablet, TAKE 1 TABLET BY MOUTH EVERY DAY, Disp: 90 tablet, Rfl: 0   metFORMIN  (GLUCOPHAGE -XR) 750 MG 24 hr tablet, TAKE 2 TABLETS(1500 MG) BY MOUTH DAILY WITH BREAKFAST, Disp: 180 tablet, Rfl: 1   miconazole  (MICATIN) 2 % cream, Apply 1 Application topically 2 (two) times daily., Disp: 113.4 g, Rfl: 0   ONETOUCH ULTRA test strip, TEST UP TO FOUR TIMES A DAY AS DIRECTED, Disp: 300 strip, Rfl: 0   QUEtiapine  (SEROQUEL ) 25 MG tablet, TAKE 1 TABLET BY MOUTH EVERYDAY AT BEDTIME, Disp: 90 tablet, Rfl: 0   Semaglutide , 1 MG/DOSE, 4 MG/3ML SOPN, Inject 1 mg as directed once a week., Disp: 9 mL, Rfl: 3   venlafaxine  XR (EFFEXOR -XR) 150 MG 24 hr capsule, TAKE 1 CAPSULE BY MOUTH EVERY MORNING, Disp: 90 capsule, Rfl: 0   vitamin B-12 (CYANOCOBALAMIN ) 500 MCG tablet, Take 500 mcg by mouth daily., Disp: , Rfl:   Allergies  Allergen Reactions   Abilify [Aripiprazole] Shortness Of Breath    Other reaction(s): Difficulty breathing   Invokana [Canagliflozin] Itching    Yeast infections   Metformin  And Related     Diarrhea, but doing well with XR formulation     Lovaza  [Omega-3-Acid  Ethyl Esters (Fish)] Other (See Comments)    Burping     I personally reviewed  active problem list, medication list, allergies with the patient/caregiver today.   ROS  Ten systems reviewed and is negative except as mentioned in HPI    Objective Physical Exam  CONSTITUTIONAL: Patient appears well-developed and well-nourished. No distress. HEENT: Head atraumatic, normocephalic, neck supple. CARDIOVASCULAR: Normal rate, regular rhythm and normal heart sounds. No murmur heard. No BLE edema. PULMONARY: Effort normal and breath sounds normal. No respiratory distress. ABDOMINAL: There is no tenderness or distention. MUSCULOSKELETAL: Normal gait. Without gross motor or sensory deficit. PSYCHIATRIC: Patient has a normal mood and affect. Behavior is normal. Judgment and thought content normal. NEUROLOGICAL: Sensation intact.  Vitals:   06/17/23 1153  BP: 114/70  Pulse: 97  Resp: 16  SpO2: 99%  Weight: 233 lb 6.4 oz (105.9 kg)  Height: 5\' 8"  (1.727 m)    Body mass  index is 35.49 kg/m.  Recent Results (from the past 2160 hours)  POCT glycosylated hemoglobin (Hb A1C)     Status: None   Collection Time: 06/17/23 11:57 AM  Result Value Ref Range   Hemoglobin A1C 5.6 4.0 - 5.6 %   HbA1c POC (<> result, manual entry)     HbA1c, POC (prediabetic range)     HbA1c, POC (controlled diabetic range)      Diabetic Foot Exam:  Diabetic Foot Exam - Simple   Simple Foot Form Visual Inspection See comments: Yes Sensation Testing Intact to touch and monofilament testing bilaterally: Yes Pulse Check Posterior Tibialis and Dorsalis pulse intact bilaterally: Yes Comments Dry skin, callus formation       PHQ2/9:    06/17/2023   11:52 AM 12/09/2022   11:33 AM 08/07/2022   11:00 AM 07/04/2022    2:35 PM 04/01/2022   11:57 AM  Depression screen PHQ 2/9  Decreased Interest 1 0 1 1 1   Down, Depressed, Hopeless 1 0 1 0 1  PHQ - 2 Score 2 0 2 1 2   Altered sleeping 1 0 0  1  Tired, decreased energy 1 0 0  1  Change in appetite 0 0 0  1  Feeling bad or failure about  yourself  0 0 1  1  Trouble concentrating 1 0 0  0  Moving slowly or fidgety/restless 0 0 0  0  Suicidal thoughts 0 0 0  0  PHQ-9 Score 5 0 3  6  Difficult doing work/chores Somewhat difficult  Somewhat difficult  Somewhat difficult    phq 9 is positive  Fall Risk:    06/17/2023   11:47 AM 12/09/2022   11:33 AM 08/07/2022   11:00 AM 07/04/2022    2:35 PM 04/01/2022   11:57 AM  Fall Risk   Falls in the past year? 0 1 0 0 0  Number falls in past yr: 0 0 0 0   Injury with Fall? 0 1 0 0   Risk for fall due to : No Fall Risks No Fall Risks No Fall Risks No Fall Risks No Fall Risks  Follow up Falls prevention discussed;Education provided;Falls evaluation completed Falls prevention discussed Falls prevention discussed;Education provided;Falls evaluation completed Falls evaluation completed Falls prevention discussed     Assessment & Plan Type 2 diabetes mellitus with improved control/associated with HTN and hyperlipidemia  A1c decreased to 5.6% with Ozempic  and metformin . Weight decreased to 233.4 lbs. - Reduce metformin  to 750 mg once daily. - Continue Ozempic  1 mg weekly. - Consider discontinuing metformin  if A1c stable and weight decreases at next follow-up.  Essential (primary) hypertension Blood pressure 114/70 mmHg. No symptoms. Losartan  dosage may need adjustment. Current at 100 mg - Instruct to monitor blood pressure at home. - Schedule nurse visit for blood pressure check in 2-3 weeks. - Consider reducing losartan  to 50 mg if blood pressure remains low.  Hyperlipidemia Managed with atorvastatin  and fenofibrate . No new issues. - Send refill for atorvastatin  40 mg. - Continue fenofibrate  145 mg.  Morbid obesity Weight decreased to 233.4 lbs. Progress noted. - Continue current weight management strategies.  Obstructive sleep apnea Managed with CPAP. No issues reported. Compliant   Major depressive disorder, recurrent, moderate/Mood Disorder Chronic condition managed with  Wellbutrin , quetiapine , and venlafaxine . Stress from caregiving noted. - Continue Wellbutrin  300 mg. - Continue quetiapine  25 mg at bedtime for sleep. - Continue venlafaxine  150 mg. - Encourage engagement in community activities or hobbies.  Osteoarthritis of right knee Intermittent pain after prolonged standing or stairs. No daily pain. - Consider using Tylenol  for pain management as needed.  Gout without current attack Well-controlled with allopurinol . - Continue allopurinol  twice daily.

## 2023-06-18 LAB — MICROALBUMIN / CREATININE URINE RATIO
Creatinine, Urine: 119 mg/dL (ref 20–275)
Microalb Creat Ratio: 7 mg/g{creat} (ref <30)
Microalb, Ur: 0.8 mg/dL

## 2023-06-18 LAB — CBC WITH DIFFERENTIAL/PLATELET
Absolute Lymphocytes: 1870 {cells}/uL (ref 850–3900)
Absolute Monocytes: 340 {cells}/uL (ref 200–950)
Basophils Absolute: 30 {cells}/uL (ref 0–200)
Basophils Relative: 0.6 %
Eosinophils Absolute: 60 {cells}/uL (ref 15–500)
Eosinophils Relative: 1.2 %
HCT: 39.8 % (ref 35.0–45.0)
Hemoglobin: 13.4 g/dL (ref 11.7–15.5)
MCH: 32.5 pg (ref 27.0–33.0)
MCHC: 33.7 g/dL (ref 32.0–36.0)
MCV: 96.6 fL (ref 80.0–100.0)
MPV: 9.7 fL (ref 7.5–12.5)
Monocytes Relative: 6.8 %
Neutro Abs: 2700 {cells}/uL (ref 1500–7800)
Neutrophils Relative %: 54 %
Platelets: 202 10*3/uL (ref 140–400)
RBC: 4.12 10*6/uL (ref 3.80–5.10)
RDW: 13.4 % (ref 11.0–15.0)
Total Lymphocyte: 37.4 %
WBC: 5 10*3/uL (ref 3.8–10.8)

## 2023-06-18 LAB — COMPREHENSIVE METABOLIC PANEL WITH GFR
AG Ratio: 1.5 (calc) (ref 1.0–2.5)
ALT: 27 U/L (ref 6–29)
AST: 36 U/L — ABNORMAL HIGH (ref 10–35)
Albumin: 4.1 g/dL (ref 3.6–5.1)
Alkaline phosphatase (APISO): 40 U/L (ref 37–153)
BUN: 15 mg/dL (ref 7–25)
CO2: 26 mmol/L (ref 20–32)
Calcium: 9.8 mg/dL (ref 8.6–10.4)
Chloride: 105 mmol/L (ref 98–110)
Creat: 0.69 mg/dL (ref 0.50–1.05)
Globulin: 2.7 g/dL (ref 1.9–3.7)
Glucose, Bld: 104 mg/dL — ABNORMAL HIGH (ref 65–99)
Potassium: 4.2 mmol/L (ref 3.5–5.3)
Sodium: 141 mmol/L (ref 135–146)
Total Bilirubin: 0.8 mg/dL (ref 0.2–1.2)
Total Protein: 6.8 g/dL (ref 6.1–8.1)
eGFR: 94 mL/min/{1.73_m2} (ref >=60)

## 2023-06-18 LAB — URIC ACID: Uric Acid, Serum: 5 mg/dL (ref 2.5–7.0)

## 2023-06-18 LAB — LIPID PANEL
Cholesterol: 146 mg/dL (ref <200)
HDL: 41 mg/dL — ABNORMAL LOW (ref >=50)
LDL Cholesterol (Calc): 74 mg/dL
Non-HDL Cholesterol (Calc): 105 mg/dL (ref <130)
Total CHOL/HDL Ratio: 3.6 (calc) (ref <5.0)
Triglycerides: 226 mg/dL — ABNORMAL HIGH (ref <150)

## 2023-06-19 ENCOUNTER — Encounter: Payer: Self-pay | Admitting: Family Medicine

## 2023-07-02 ENCOUNTER — Ambulatory Visit

## 2023-07-02 VITALS — BP 122/70

## 2023-07-02 DIAGNOSIS — E1159 Type 2 diabetes mellitus with other circulatory complications: Secondary | ICD-10-CM

## 2023-07-02 NOTE — Progress Notes (Signed)
 Patient is in office today for a nurse visit for Blood Pressure Check. Patient blood pressure was 122/70, Patient No chest pain, No shortness of breath, No dyspnea on exertion, No orthopnea, No paroxysmal nocturnal dyspnea, No edema, No palpitations, No syncope, . Patient has been taking medication 100 mg every other day. Pt was confused with instructions on day of appointment.   Previous OV note: Essential (primary) hypertension Blood pressure 114/70 mmHg. No symptoms. Losartan  dosage may need adjustment. Current at 100 mg - Instruct to monitor blood pressure at home. - Schedule nurse visit for blood pressure check in 2-3 weeks. - Consider reducing losartan  to 50 mg if blood pressure remains low.

## 2023-07-07 NOTE — Progress Notes (Signed)
 Pt.notified

## 2023-07-10 ENCOUNTER — Other Ambulatory Visit: Payer: Self-pay | Admitting: Family Medicine

## 2023-07-10 ENCOUNTER — Ambulatory Visit: Payer: Self-pay

## 2023-07-10 DIAGNOSIS — Z0000 Encounter for general adult medical examination without abnormal findings: Secondary | ICD-10-CM | POA: Diagnosis not present

## 2023-07-10 DIAGNOSIS — Z1211 Encounter for screening for malignant neoplasm of colon: Secondary | ICD-10-CM

## 2023-07-10 DIAGNOSIS — I10 Essential (primary) hypertension: Secondary | ICD-10-CM
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
## 2023-07-10 NOTE — Patient Instructions (Signed)
 Tina Short , Thank you for taking time out of your busy schedule to complete your Annual Wellness Visit with me. I enjoyed our conversation and look forward to speaking with you again next year. I, as well as your care team,  appreciate your ongoing commitment to your health goals. Please review the following plan we discussed and let me know if I can assist you in the future. Your Game plan/ To Do List    Referrals: If you haven't heard from the office you've been referred to, please reach out to them at the phone provided.  N/a Follow up Visits: Next Medicare AWV with our clinical staff: 08/04/2024 at 2:40   Have you seen your provider in the last 6 months (3 months if uncontrolled diabetes)? Yes Next Office Visit with your provider: 12/18/2023 at 11:00  Clinician Recommendations:  Aim for 30 minutes of exercise or brisk walking, 6-8 glasses of water, and 5 servings of fruits and vegetables each day.       This is a list of the screening recommended for you and due dates:  Health Maintenance  Topic Date Due   Eye exam for diabetics  01/17/2022   Zoster (Shingles) Vaccine (1 of 2) 09/17/2023*   Colon Cancer Screening  06/16/2024*   Flu Shot  09/11/2023   Mammogram  10/03/2023   Pneumonia Vaccine (3 of 3 - PCV20 or PCV21) 10/28/2023   Hemoglobin A1C  12/18/2023   Yearly kidney function blood test for diabetes  06/16/2024   Yearly kidney health urinalysis for diabetes  06/16/2024   Complete foot exam   06/16/2024   Medicare Annual Wellness Visit  07/09/2024   DTaP/Tdap/Td vaccine (3 - Td or Tdap) 01/30/2028   DEXA scan (bone density measurement)  Completed   Hepatitis C Screening  Completed   HPV Vaccine  Aged Out   Meningitis B Vaccine  Aged Out   COVID-19 Vaccine  Discontinued  *Topic was postponed. The date shown is not the original due date.    Advanced directives: (ACP Link)Information on Advanced Care Planning can be found at [place]ora  Secretary of [facility] Advance Health  Care Directives Advance Health Care Directives. [email or web address]  Advance Care Planning is important because it:  [x]  Makes sure you receive the medical care that is consistent with your values, goals, and preferences  [x]  It provides guidance to your family and loved ones and reduces their decisional burden about whether or not they are making the right decisions based on your wishes.  Follow the link provided in your after visit summary or read over the paperwork we have mailed to you to help you started getting your Advance Directives in place. If you need assistance in completing these, please reach out to us  so that we can help you!  See attachments for Preventive Care and Fall Prevention Tips.

## 2023-07-31 ENCOUNTER — Encounter: Payer: Self-pay | Admitting: *Deleted

## 2023-08-20 ENCOUNTER — Other Ambulatory Visit: Payer: Self-pay

## 2023-08-20 ENCOUNTER — Telehealth: Payer: Self-pay

## 2023-08-20 DIAGNOSIS — Z860100 Personal history of colon polyps, unspecified: Secondary | ICD-10-CM

## 2023-08-20 MED ORDER — NA SULFATE-K SULFATE-MG SULF 17.5-3.13-1.6 GM/177ML PO SOLN: 1.0000 | Freq: Once | ORAL | 0 refills | Status: AC

## 2023-08-20 NOTE — Telephone Encounter (Signed)
 Gastroenterology Pre-Procedure Review  Request Date: 11/26/23 Requesting Physician: Dr. Jinny  PATIENT REVIEW QUESTIONS: The patient responded to the following health history questions as indicated:    1. Are you having any GI issues? no 2. Do you have a personal history of Polyps? yes (last colonoscopy performed by Dr. Unk 04/15/2018 recommended repeat in 5 years) 3. Do you have a family history of Colon Cancer or Polyps? no 4. Diabetes Mellitus? yes (takes semaglutide  and meformin advised verbally and noted on instructions to stop semaglutide  7 days prior and stop metformin  2 days prior) 5. Joint replacements in the past 12 months?no 6. Major health problems in the past 3 months?no 7. Any artificial heart valves, MVP, or defibrillator?no    MEDICATIONS & ALLERGIES:    Patient reports the following regarding taking any anticoagulation/antiplatelet therapy:   Plavix, Coumadin, Eliquis, Xarelto, Lovenox , Pradaxa, Brilinta, or Effient? no Aspirin ? no  Patient confirms/reports the following medications:  Current Outpatient Medications  Medication Sig Dispense Refill   allopurinol  (ZYLOPRIM ) 100 MG tablet Take 1 tablet (100 mg total) by mouth 2 (two) times daily. 180 tablet 1   atorvastatin  (LIPITOR) 40 MG tablet Take 1 tablet (40 mg total) by mouth every evening. 90 tablet 1   blood glucose meter kit and supplies Dispense based on patient and insurance preference. Use up to four times daily as directed. (FOR ICD-10 E10.9, E11.9). 1 each 0   buPROPion  (WELLBUTRIN  XL) 300 MG 24 hr tablet Take 1 tablet (300 mg total) by mouth every morning. 90 tablet 1   Cholecalciferol (VITAMIN D ) 50 MCG (2000 UT) CAPS Take 1 capsule (2,000 Units total) by mouth daily at 12 noon. 100 capsule 1   cyanocobalamin  (VITAMIN B12) 500 MCG tablet Take 500 mcg by mouth every other day.     fenofibrate  (TRICOR ) 145 MG tablet Take 1 tablet (145 mg total) by mouth daily. 90 tablet 1   losartan  (COZAAR ) 100 MG tablet  TAKE 1 TABLET BY MOUTH EVERY DAY (Patient taking differently: Take 100 mg by mouth daily. Taking half a tab) 90 tablet 0   metFORMIN  (GLUCOPHAGE -XR) 750 MG 24 hr tablet Take 1 tablet (750 mg total) by mouth daily with breakfast. 90 tablet 1   miconazole  (MICATIN) 2 % cream Apply 1 Application topically 2 (two) times daily. 113.4 g 0   Na Sulfate-K Sulfate-Mg Sulfate concentrate (SUPREP) 17.5-3.13-1.6 GM/177ML SOLN Take 1 kit (354 mLs total) by mouth once for 1 dose. 354 mL 0   ONETOUCH ULTRA test strip TEST UP TO FOUR TIMES A DAY AS DIRECTED 300 strip 0   QUEtiapine  (SEROQUEL ) 25 MG tablet Take 1 tablet (25 mg total) by mouth at bedtime. 90 tablet 1   Semaglutide , 1 MG/DOSE, 4 MG/3ML SOPN Inject 1 mg as directed once a week. 9 mL 3   venlafaxine  XR (EFFEXOR -XR) 150 MG 24 hr capsule Take 1 capsule (150 mg total) by mouth every morning. 90 capsule 1   No current facility-administered medications for this visit.    Patient confirms/reports the following allergies:  Allergies  Allergen Reactions   Abilify [Aripiprazole] Shortness Of Breath    Other reaction(s): Difficulty breathing   Invokana [Canagliflozin] Itching    Yeast infections   Metformin  And Related     Diarrhea, but doing well with XR formulation     Lovaza  [Omega-3-Acid  Ethyl Esters (Fish)] Other (See Comments)    Burping     No orders of the defined types were placed in this encounter.  AUTHORIZATION INFORMATION Primary Insurance: 1D#: Group #:  Secondary Insurance: 1D#: Group #:  SCHEDULE INFORMATION: Date: 11/26/23 Time: Location: [facility]

## 2023-09-07 ENCOUNTER — Other Ambulatory Visit: Payer: Self-pay | Admitting: Pharmacist

## 2023-09-07 ENCOUNTER — Encounter: Payer: Self-pay | Admitting: Pharmacist

## 2023-09-07 DIAGNOSIS — Z5986 Financial insecurity: Secondary | ICD-10-CM

## 2023-09-07 NOTE — Patient Instructions (Signed)
 As of 7/25, Ozempic  will no longer be automatically refilled from [employer] patient assistance program. To request refills after this date, program must receive a refill request form from office up to 30 days prior to refill being due.  Please monitor your supply of Ozempic  at home and when you have only a 1 month supply remaining, contact Shasta Spear at [phone] to request she start refill process for you.  Thank you!  Sharyle Sia, PharmD, [facility] Clinical Pharmacist [facility] [phone]

## 2023-09-07 NOTE — Progress Notes (Signed)
   09/07/2023  Patient ID: Tina Short, female   DOB: August 18, 1955, 68 y.o.   MRN: [id number]  Patient enrolled in Ozempic  patient assistance program from [employer].  As of 7/25, Ozempic  will no longer be eligible for automatic refill from [employer]. To request refills after this date, program must receive a refill request form from office up to 30 days prior to refill being due.  Outreach to patient today and provide this update. Advise patient to monitor supply of Ozempic  at home and when has only a 1 month supply remaining, to contact Shasta Spear: Phone: [phone] to request CPhT start refill form with PCP to be sent to program.  Sharyle Sia, PharmD, [facility] Clinical Pharmacist [facility] [phone]

## 2023-09-16 ENCOUNTER — Other Ambulatory Visit: Payer: Self-pay | Admitting: Family Medicine

## 2023-09-16 DIAGNOSIS — E785 Hyperlipidemia, unspecified: Secondary | ICD-10-CM

## 2023-10-08 DIAGNOSIS — E119 Type 2 diabetes mellitus without complications: Secondary | ICD-10-CM | POA: Diagnosis not present

## 2023-10-08 DIAGNOSIS — H2513 Age-related nuclear cataract, bilateral: Secondary | ICD-10-CM | POA: Diagnosis not present

## 2023-10-08 LAB — HM DIABETES EYE EXAM

## 2023-10-09 ENCOUNTER — Encounter: Payer: Self-pay | Admitting: Family Medicine

## 2023-11-03 ENCOUNTER — Telehealth: Payer: Self-pay

## 2023-11-03 MED ORDER — PEG 3350-KCL-NA BICARB-NACL 420 G PO SOLR: 4000.0000 mL | Freq: Once | ORAL | 0 refills | Status: AC

## 2023-11-03 NOTE — Telephone Encounter (Signed)
 Colonoscopy instructions requested.  Pt was unable to find them in her mychart.  Helped pt locate them and printed in mychart.    Requested prep change.  Prep changed from Suprep to Golytely /Nulytely .  Thanks,  [place], CMA

## 2023-11-20 ENCOUNTER — Other Ambulatory Visit: Payer: Self-pay | Admitting: Pharmacist

## 2023-11-20 ENCOUNTER — Encounter: Payer: Self-pay | Admitting: Pharmacist

## 2023-11-20 DIAGNOSIS — E1169 Type 2 diabetes mellitus with other specified complication: Secondary | ICD-10-CM

## 2023-11-20 NOTE — Patient Instructions (Signed)
 [employer], the maker of Ozempic  and Rybelsus , has announced that they will no longer offer these medications to Medicare beneficiaries through their Patient Assistance Program in 2026.    This means that if you currently receive Ozempic  or Rybelsus  at no cost through the program, starting in January 2026 you'll need to use your insurance to continue on these medicines.      Choosing a Medicare Plan   With Medicare's Annual Enrollment Period starting on October 15th, we encourage you to review formulary coverage and copay amounts for Ozempic  or Rybelsus , as costs can vary widely between plans. Starting on Wednesday, October 1st you can compare your Medicare plan options by going to the Plan Finder tool at [employer].gov ( TeleconferenceOnDemand.fr ).    There are Medicare Specialists through the [place] [employer] Insurance Information Program (Chance Freer) that can help you shop for Medicare plans.    [place] [facility] toll free number: [phone] (Monday - Friday 8 am - 5 pm)   There are representatives located in each county:    [facility] [facility] [address]     [place]  [address] [phone] Call for an appointment with [facility]   Maximum Out-of-Pocket and Prescription Payment Plan   In 2026, the maximum yearly out-of-pocket cost for medications is $2,100 for all Medicare beneficiaries. This means you will not have to pay more than $2,100 in total for all prescription medications filled on your Medicare plan in 2026. You may also choose to enroll in a Medicare Prescription Payment Plan through your chosen 2026 Medicare plan. This lets you spread out your prescription drug costs over the year instead of laying a large amount all at once at the pharmacy.    Sharyle Sia, PharmD, [facility] [phone]

## 2023-11-20 NOTE — Progress Notes (Signed)
   11/20/2023  Patient ID: Tina Short, female   DOB: July 21, 1955, 68 y.o.   MRN: [id number]  Patient enrolled in Ozempic  patient assistance program from [employer] through 02/10/2024  [employer] has announced that they will no longer offer Ozempic  to [employer] beneficiaries through their Patient Assistance Program in 2026.   Outreach to patient today and provide this update.   Based on reported income, patient does not meet criteria for Extra Help subsidy  Counsel patient that Medicare's Annual Enrollment Period for 2026 starts 11/25/2023. Encourage patient to review deductibles formulary coverage and copay amounts (including for Ozempic ) between plans. Also share with patient the contact information for the [facility] Information Program [facility]) that can help with reviewing these Medicare plans As requested will share this information with patient via MyChart message  Will send a message to CPhT Shasta Spear requesting that she assist patient with obtaining a refill of Ozempic  from [employer].  Declines to schedule follow up with pharmacist at this time. Provide contact information as requested  Sharyle Sia, PharmD, [facility] [phone]

## 2023-11-24 ENCOUNTER — Telehealth: Payer: Self-pay

## 2023-11-24 NOTE — Telephone Encounter (Signed)
 Received provider portion refill reorder from [employer] ozempic  from provider office faxed it [employer] today.

## 2023-11-25 ENCOUNTER — Encounter: Payer: Self-pay | Admitting: Gastroenterology

## 2023-11-25 ENCOUNTER — Other Ambulatory Visit: Payer: PPO | Admitting: Pharmacist

## 2023-11-26 ENCOUNTER — Ambulatory Visit
Admission: RE | Admit: 2023-11-26 | Discharge: 2023-11-26 | Disposition: A | Discharge status: Home / Self Care | Attending: Gastroenterology | Admitting: Gastroenterology

## 2023-11-26 ENCOUNTER — Ambulatory Visit: Admitting: Anesthesiology

## 2023-11-26 ENCOUNTER — Encounter: Admission: RE | Disposition: A | Payer: Self-pay | Source: Home / Self Care | Attending: Gastroenterology

## 2023-11-26 ENCOUNTER — Encounter: Payer: Self-pay | Admitting: Gastroenterology

## 2023-11-26 DIAGNOSIS — E66813 Obesity, class 3: Secondary | ICD-10-CM | POA: Diagnosis not present

## 2023-11-26 DIAGNOSIS — Z860101 Personal history of adenomatous and serrated colon polyps: Secondary | ICD-10-CM | POA: Insufficient documentation

## 2023-11-26 DIAGNOSIS — Z1211 Encounter for screening for malignant neoplasm of colon: Secondary | ICD-10-CM | POA: Insufficient documentation

## 2023-11-26 DIAGNOSIS — Z860100 Personal history of colon polyps, unspecified: Secondary | ICD-10-CM

## 2023-11-26 DIAGNOSIS — G4730 Sleep apnea, unspecified: Secondary | ICD-10-CM | POA: Diagnosis not present

## 2023-11-26 DIAGNOSIS — Z79899 Other long term (current) drug therapy: Secondary | ICD-10-CM | POA: Insufficient documentation

## 2023-11-26 DIAGNOSIS — K640 First degree hemorrhoids: Secondary | ICD-10-CM | POA: Insufficient documentation

## 2023-11-26 DIAGNOSIS — I10 Essential (primary) hypertension: Secondary | ICD-10-CM | POA: Diagnosis not present

## 2023-11-26 DIAGNOSIS — K5730 Diverticulosis of large intestine without perforation or abscess without bleeding: Secondary | ICD-10-CM | POA: Insufficient documentation

## 2023-11-26 DIAGNOSIS — Z6835 Body mass index (BMI) 35.0-35.9, adult: Secondary | ICD-10-CM | POA: Diagnosis not present

## 2023-11-26 DIAGNOSIS — Z7984 Long term (current) use of oral hypoglycemic drugs: Secondary | ICD-10-CM | POA: Diagnosis not present

## 2023-11-26 DIAGNOSIS — F32A Depression, unspecified: Secondary | ICD-10-CM | POA: Insufficient documentation

## 2023-11-26 DIAGNOSIS — E1140 Type 2 diabetes mellitus with diabetic neuropathy, unspecified: Secondary | ICD-10-CM | POA: Diagnosis not present

## 2023-11-26 DIAGNOSIS — E119 Type 2 diabetes mellitus without complications: Secondary | ICD-10-CM | POA: Diagnosis not present

## 2023-11-26 HISTORY — PX: COLONOSCOPY: SHX5424

## 2023-11-26 LAB — GLUCOSE, CAPILLARY: Glucose-Capillary: 106 mg/dL — ABNORMAL HIGH (ref 70–99)

## 2023-11-26 SURGERY — COLONOSCOPY
Anesthesia: General

## 2023-11-26 MED ORDER — PROPOFOL 1000 MG/100ML IV EMUL
INTRAVENOUS | Status: AC
Start: 2023-11-26 — End: 2023-11-26
  Filled 2023-11-26: qty 100

## 2023-11-26 MED ORDER — PROPOFOL 500 MG/50ML IV EMUL
INTRAVENOUS | Status: DC | PRN
Start: 2023-11-26 — End: 2023-11-26
  Administered 2023-11-26: 125 ug/kg/min via INTRAVENOUS

## 2023-11-26 MED ORDER — LIDOCAINE HCL (PF) 2 % IJ SOLN
INTRAMUSCULAR | Status: AC
  Filled 2023-11-26: qty 5

## 2023-11-26 MED ORDER — PROPOFOL 10 MG/ML IV BOLUS
INTRAVENOUS | Status: DC | PRN
  Administered 2023-11-26: 60 mg via INTRAVENOUS

## 2023-11-26 MED ORDER — SODIUM CHLORIDE 0.9 % IV SOLN
INTRAVENOUS | Status: DC
  Administered 2023-11-26: 500 mL via INTRAVENOUS

## 2023-11-26 MED ORDER — LIDOCAINE HCL (CARDIAC) PF 100 MG/5ML IV SOSY
PREFILLED_SYRINGE | INTRAVENOUS | Status: DC | PRN
  Administered 2023-11-26: 40 mg via INTRAVENOUS

## 2023-11-26 NOTE — Anesthesia Postprocedure Evaluation (Signed)
 Anesthesia Post Note  Patient: Tina Short  Procedure(s) Performed: COLONOSCOPY  Patient location during evaluation: PACU Anesthesia Type: General Level of consciousness: awake Pain management: satisfactory to patient Vital Signs Assessment: post-procedure vital signs reviewed and stable Respiratory status: spontaneous breathing Cardiovascular status: stable Anesthetic complications: no   No notable events documented.   Last Vitals:  Vitals:   11/26/23 1041 11/26/23 1051  BP: 118/82 (!) 140/81  Pulse: 83 78  Resp: 20 (!) 21  Temp: (!) 35.8 C   SpO2: 99% 97%    Last Pain:  Vitals:   11/26/23 1051  TempSrc:   PainSc: 0-No pain                 VAN STAVEREN,Marylene Masek
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
## 2023-11-26 NOTE — Op Note (Signed)
 [facility] Gastroenterology Patient Name: Tina Short Procedure Date: 11/26/2023 10:07 AM MRN: [id number] Account #: [id number] Date of Birth: 1955/06/13 Admit Type: Outpatient Age: 68 Room: [facility] ENDO ROOM 4 Gender: Female Note Status: Finalized Instrument Name: Colon Scope [id number] Procedure:             Colonoscopy Indications:           High risk colon cancer surveillance: Personal history                         of colonic polyps Providers:             Rogelia Copping MD, MD Referring MD:          Dorette FALCON. Sowles, MD (Referring MD) Medicines:             Propofol  per Anesthesia Complications:         No immediate complications. Procedure:             Pre-Anesthesia Assessment:                        - Prior to the procedure, a History and Physical was                         performed, and patient medications and allergies were                         reviewed. The patient's tolerance of previous                         anesthesia was also reviewed. The risks and benefits                         of the procedure and the sedation options and risks                         were discussed with the patient. All questions were                         answered, and informed consent was obtained. Prior                         Anticoagulants: The patient has taken no anticoagulant                         or antiplatelet agents. ASA Grade Assessment: II - A                         patient with mild systemic disease. After reviewing                         the risks and benefits, the patient was deemed in                         satisfactory condition to undergo the procedure.                        After obtaining informed consent, the colonoscope was  passed under direct vision. Throughout the procedure,                         the patient's blood pressure, pulse, and oxygen                         saturations were monitored continuously. The  was                         introduced through the anus and advanced to the the                         cecum, identified by appendiceal orifice and ileocecal                         valve. The colonoscopy was performed without                         difficulty. The patient tolerated the procedure well.                         The quality of the bowel preparation was excellent. Findings:      The perianal and digital rectal examinations were normal.      Many small-mouthed diverticula were found in the sigmoid colon and       descending colon.      Non-bleeding internal hemorrhoids were found during retroflexion. The       hemorrhoids were Grade I (internal hemorrhoids that do not prolapse). Impression:            - Diverticulosis in the sigmoid colon and in the                         descending colon.                        - Non-bleeding internal hemorrhoids.                        - No specimens collected. Recommendation:        - Discharge patient to home.                        - Resume previous diet.                        - Continue present medications.                        - Repeat colonoscopy in 7 years for surveillance. Procedure Code(s):     --- Professional ---                        [id number], Colonoscopy, flexible; diagnostic, including                         collection of specimen(s) by brushing or washing, when                         performed (separate procedure) Diagnosis Code(s):     --- Professional ---  Z86.010, Personal history of colonic polyps CPT copyright 2022 American Medical Association. All rights reserved. The codes documented in this report are preliminary and upon coder review may  be revised to meet current compliance requirements. Rogelia Copping MD, MD 11/26/2023 10:37:49 AM This report has been signed electronically. Number of Addenda: 0 Note Initiated On: 11/26/2023 10:07 AM Scope Withdrawal Time: 0 hours 5 minutes 17 seconds   Total Procedure Duration: 0 hours 16 minutes 57 seconds  Estimated Blood Loss:  Estimated blood loss: none.      [facility]

## 2023-11-26 NOTE — Transfer of Care (Signed)
 Immediate Anesthesia Transfer of Care Note  Patient: Tina Short  Procedure(s) Performed: COLONOSCOPY  Patient Location: PACU  Anesthesia Type:General  Level of Consciousness: drowsy and patient cooperative  Airway & Oxygen Therapy: Patient Spontanous Breathing  Post-op Assessment: Report given to RN and Post -op Vital signs reviewed and stable  Post vital signs: stable  Last Vitals:  Vitals Value Taken Time  BP 118/82 11/26/23 10:41  Temp    Pulse 80 11/26/23 10:42  Resp 23 11/26/23 10:42  SpO2 97 % 11/26/23 10:42  Vitals shown include unfiled device data.  Last Pain:  Vitals:   11/26/23 0938  TempSrc: Temporal  PainSc: 0-No pain         Complications: No notable events documented.

## 2023-11-26 NOTE — Anesthesia Preprocedure Evaluation (Signed)
 Anesthesia Evaluation  Patient identified by MRN, date of birth, ID band Patient awake    Reviewed: Allergy & Precautions, NPO status , Patient's Chart, lab work & pertinent test results  Airway Mallampati: II  TM Distance: >3 FB Neck ROM: full    Dental  (+) Missing   Pulmonary neg pulmonary ROS, sleep apnea and Continuous Positive Airway Pressure Ventilation    Pulmonary exam normal        Cardiovascular Exercise Tolerance: Good hypertension, Pt. on medications negative cardio ROS Normal cardiovascular exam Rhythm:Regular Rate:Normal     Neuro/Psych    Depression    negative neurological ROS  negative psych ROS   GI/Hepatic negative GI ROS, Neg liver ROS,,,  Endo/Other  negative endocrine ROSdiabetes, Type 2, Oral Hypoglycemic Agents  Class 3 obesity  Renal/GU negative Renal ROS  negative genitourinary   Musculoskeletal   Abdominal  (+) + obese  Peds negative pediatric ROS (+)  Hematology negative hematology ROS (+)   Anesthesia Other Findings Past Medical History: No date: Depression No date: Diabetes (HCC) No date: Hyperlipidemia No date: Hypertension No date: Neuropathy due to type 2 diabetes mellitus (HCC) 11/15/2009: Obesity, morbid (HCC) No date: Sleep apnea     Comment:  cpap No date: Swelling  Past Surgical History: No date: ABDOMINAL HYSTERECTOMY No date: COLONOSCOPY 05/14/2016: COLONOSCOPY WITH PROPOFOL ; N/A     Comment:  Procedure: COLONOSCOPY WITH PROPOFOL ;  Surgeon: Reyes LELON Cota, MD;  Location: [facility] ENDOSCOPY;  Service:               Endoscopy;  Laterality: N/A; 04/15/2018: COLONOSCOPY WITH PROPOFOL ; N/A     Comment:  Procedure: COLONOSCOPY WITH PROPOFOL ;  Surgeon: Unk Corinn Skiff, MD;  Location: [facility] ENDOSCOPY;  Service:               Gastroenterology;  Laterality: N/A; 04/15/2018: ESOPHAGOGASTRODUODENOSCOPY (EGD) WITH PROPOFOL ; N/A     Comment:   Procedure: ESOPHAGOGASTRODUODENOSCOPY (EGD) WITH               PROPOFOL ;  Surgeon: Unk Corinn Skiff, MD;  Location:               [facility] ENDOSCOPY;  Service: Gastroenterology;  Laterality:               N/A; No date: FOOT SURGERY; Right     Comment:  X2 No date: SHOULDER SURGERY; Right No date: TOTAL KNEE ARTHROPLASTY; Left     Comment:  Dr. Leora at [facility] No date: WRIST SURGERY; Bilateral  BMI    Body Mass Index: 35.18 kg/m      Reproductive/Obstetrics negative OB ROS                              Anesthesia Physical Anesthesia Plan  ASA: 3  Anesthesia Plan: General   Post-op Pain Management:    Induction: Intravenous  PONV Risk Score and Plan: Propofol  infusion and TIVA  Airway Management Planned: Natural Airway and Nasal Cannula  Additional Equipment:   Intra-op Plan:   Post-operative Plan:   Informed Consent: I have reviewed the patients History and Physical, chart, labs and discussed the procedure including the risks, benefits and alternatives for the proposed anesthesia with the patient or authorized representative who has indicated his/her understanding and acceptance.  Dental Advisory Given  Plan Discussed with: CRNA  Anesthesia Plan Comments:         Anesthesia Quick Evaluation

## 2023-12-18 ENCOUNTER — Ambulatory Visit: Admitting: Family Medicine

## 2023-12-18 ENCOUNTER — Encounter: Payer: Self-pay | Admitting: Family Medicine

## 2023-12-18 VITALS — BP 144/82 | HR 88 | Resp 16 | Ht 68.0 in | Wt 233.3 lb

## 2023-12-18 DIAGNOSIS — M109 Gout, unspecified: Secondary | ICD-10-CM

## 2023-12-18 DIAGNOSIS — E1159 Type 2 diabetes mellitus with other circulatory complications: Secondary | ICD-10-CM

## 2023-12-18 DIAGNOSIS — I152 Hypertension secondary to endocrine disorders: Secondary | ICD-10-CM

## 2023-12-18 DIAGNOSIS — E559 Vitamin D deficiency, unspecified: Secondary | ICD-10-CM | POA: Diagnosis not present

## 2023-12-18 DIAGNOSIS — F339 Major depressive disorder, recurrent, unspecified: Secondary | ICD-10-CM | POA: Diagnosis not present

## 2023-12-18 DIAGNOSIS — E785 Hyperlipidemia, unspecified: Secondary | ICD-10-CM

## 2023-12-18 DIAGNOSIS — E538 Deficiency of other specified B group vitamins: Secondary | ICD-10-CM | POA: Diagnosis not present

## 2023-12-18 DIAGNOSIS — E1169 Type 2 diabetes mellitus with other specified complication: Secondary | ICD-10-CM | POA: Diagnosis not present

## 2023-12-18 DIAGNOSIS — Z7985 Long-term (current) use of injectable non-insulin antidiabetic drugs: Secondary | ICD-10-CM

## 2023-12-18 DIAGNOSIS — Z1231 Encounter for screening mammogram for malignant neoplasm of breast: Secondary | ICD-10-CM

## 2023-12-18 LAB — POCT GLYCOSYLATED HEMOGLOBIN (HGB A1C): Hemoglobin A1C: 5.6 % (ref 4.0–5.6)

## 2023-12-18 MED ORDER — METFORMIN HCL ER 750 MG PO TB24: 750.0000 mg | ORAL_TABLET | Freq: Every day | ORAL | 1 refills | Status: AC

## 2023-12-18 MED ORDER — FENOFIBRATE 145 MG PO TABS
145.0000 mg | ORAL_TABLET | Freq: Every day | ORAL | 1 refills | Status: AC
Start: 2023-12-18 — End: ?

## 2023-12-18 MED ORDER — LOSARTAN POTASSIUM 50 MG PO TABS
50.0000 mg | ORAL_TABLET | Freq: Every day | ORAL | 1 refills | Status: AC
Start: 2023-12-18 — End: ?

## 2023-12-18 MED ORDER — SEMAGLUTIDE (1 MG/DOSE) 4 MG/3ML ~~LOC~~ SOPN: 1.0000 mg | PEN_INJECTOR | SUBCUTANEOUS | 0 refills | Status: AC

## 2023-12-18 MED ORDER — QUETIAPINE FUMARATE 25 MG PO TABS: 25.0000 mg | ORAL_TABLET | Freq: Every day | ORAL | 1 refills | Status: AC

## 2023-12-18 MED ORDER — VENLAFAXINE HCL ER 150 MG PO CP24: 150.0000 mg | ORAL_CAPSULE | Freq: Every morning | ORAL | 1 refills | Status: AC

## 2023-12-18 MED ORDER — BUPROPION HCL ER (XL) 300 MG PO TB24: 300.0000 mg | ORAL_TABLET | Freq: Every morning | ORAL | 1 refills | Status: AC

## 2023-12-18 MED ORDER — ALLOPURINOL 100 MG PO TABS: 100.0000 mg | ORAL_TABLET | Freq: Two times a day (BID) | ORAL | 1 refills | Status: AC

## 2023-12-18 MED ORDER — ATORVASTATIN CALCIUM 40 MG PO TABS: 40.0000 mg | ORAL_TABLET | Freq: Every evening | ORAL | 1 refills | Status: AC

## 2023-12-18 NOTE — Progress Notes (Signed)
 Name: Tina Short   MRN: [id number]    DOB: 1955/03/28   Date:12/18/2023       Progress Note  Subjective  Chief Complaint  Chief Complaint  Patient presents with   Medical Management of Chronic Issues   Discussed the use of AI scribe software for clinical note transcription with the patient, who gave verbal consent to proceed.  History of Present Illness Tina Short is a 68 year old female with hypertension and type 2 diabetes who presents for a follow-up visit regarding her blood pressure medication.  She has not been seen in six months due to scheduling issues. Her blood pressure has not been well controlled, and she reports significant stress and disruption to her routine and self-care activities due to caring for her bedridden 25 year old mother-in-law. She has not been taking her prescribed losartan  regularly as she ran out of medication and has been taking half doses. She has difficulty using her mother-in-law's blood pressure monitor and has not been able to check her blood pressure at home reliably.  Her type 2 diabetes is currently well-controlled with an A1c of 5.6%. She is taking semaglutide  1 mg and metformin . She has maintained her weight at 233 lbs with a BMI over 35. She is concerned about the cost of semaglutide  and the potential need to adjust her diabetes medication if she discontinues it. She is not compliant with her diet due to her caregiving responsibilities.  She has a history of hyperlipidemia and is taking atorvastatin  40 mg and fenofibrate  145 mg. Her triglycerides are slightly elevated, and her HDL is low but has improved over time. Her LDL is close to the target level.  She has a long history of recurrent major depression, for which she takes venlafaxine  150 mg, quetiapine  25 mg for sleep, and Wellbutrin  300 mg. Her current life stressors, including caregiving and lack of personal time, have impacted her mental health. She has not found therapy helpful in the  past.  She has not experienced any recent gout attacks and is taking allopurinol  100 mg twice daily.  She has not had a mammogram recently and her last bone density scan in 2023 was normal. She declines flu and pneumonia vaccinations at this time.    Patient Active Problem List   Diagnosis Date Noted   History of colonic polyps 11/26/2023   History of total knee replacement, left 10/16/2020   Esophageal dysphagia    Major depression, recurrent, chronic 10/30/2015   Dyslipidemia associated with type 2 diabetes mellitus (HCC) 10/30/2015   Vitamin D  deficiency 07/27/2015   Vitamin B12 deficiency 07/27/2015   History of anemia 09/12/2014   Carpal tunnel syndrome 09/12/2014   Obstructive sleep apnea 09/12/2014   Edema of extremities 09/12/2014   Controlled gout 09/12/2014   Morbid obesity (HCC) 11/15/2009   Benign essential HTN 10/07/2006   Type 2 diabetes mellitus, uncontrolled, with neuropathy 10/07/2006   Hyperlipidemia 10/07/2006    Past Surgical History:  Procedure Laterality Date   ABDOMINAL HYSTERECTOMY     COLONOSCOPY     COLONOSCOPY N/A 11/26/2023   Procedure: COLONOSCOPY;  Surgeon: Jinny Carmine, MD;  Location: [facility] ENDOSCOPY;  Service: Endoscopy;  Laterality: N/A;   COLONOSCOPY WITH PROPOFOL  N/A 05/14/2016   Procedure: COLONOSCOPY WITH PROPOFOL ;  Surgeon: Reyes LELON Cota, MD;  Location: [facility] ENDOSCOPY;  Service: Endoscopy;  Laterality: N/A;   COLONOSCOPY WITH PROPOFOL  N/A 04/15/2018   Procedure: COLONOSCOPY WITH PROPOFOL ;  Surgeon: Unk Corinn Skiff, MD;  Location: [facility] ENDOSCOPY;  Service: Gastroenterology;  Laterality: N/A;   ESOPHAGOGASTRODUODENOSCOPY (EGD) WITH PROPOFOL  N/A 04/15/2018   Procedure: ESOPHAGOGASTRODUODENOSCOPY (EGD) WITH PROPOFOL ;  Surgeon: Unk Corinn Skiff, MD;  Location: [facility] ENDOSCOPY;  Service: Gastroenterology;  Laterality: N/A;   FOOT SURGERY Right    X2   SHOULDER SURGERY Right    TOTAL KNEE ARTHROPLASTY Left    Dr[facility] Le[facility]  Surgical   WRIST SURGERY Bilateral     Family History  Problem Relation Age of Onset   Diabetes Mother    Cancer Mother        uterine   Mental illness Father    Obesity Daughter    Diabetes Maternal Grandmother    Heart disease Brother    Obesity Daughter     Social History   Tobacco Use   Smoking status: Never   Smokeless tobacco: Never  Substance Use Topics   Alcohol use: No    Alcohol/week: 0.0 standard drinks of alcohol     Current Outpatient Medications:    allopurinol  (ZYLOPRIM ) 100 MG tablet, Take 1 tablet (100 mg total) by mouth 2 (two) times daily., Disp: 180 tablet, Rfl: 1   atorvastatin  (LIPITOR) 40 MG tablet, Take 1 tablet (40 mg total) by mouth every evening., Disp: 90 tablet, Rfl: 1   blood glucose meter kit and supplies, Dispense based on patient and insurance preference. Use up to four times daily as directed. (FOR ICD-10 E10.9, E11.9)., Disp: 1 each, Rfl: 0   buPROPion  (WELLBUTRIN  XL) 300 MG 24 hr tablet, Take 1 tablet (300 mg total) by mouth every morning., Disp: 90 tablet, Rfl: 1   Cholecalciferol (VITAMIN D ) 50 MCG (2000 UT) CAPS, Take 1 capsule (2,000 Units total) by mouth daily at 12 noon., Disp: 100 capsule, Rfl: 1   cyanocobalamin  (VITAMIN B12) 500 MCG tablet, Take 500 mcg by mouth every other day., Disp: , Rfl:    fenofibrate  (TRICOR ) 145 MG tablet, Take 1 tablet (145 mg total) by mouth daily., Disp: 90 tablet, Rfl: 1   metFORMIN  (GLUCOPHAGE -XR) 750 MG 24 hr tablet, Take 1 tablet (750 mg total) by mouth daily with breakfast., Disp: 90 tablet, Rfl: 1   miconazole  (MICATIN) 2 % cream, Apply 1 Application topically 2 (two) times daily., Disp: 113.4 g, Rfl: 0   ONETOUCH ULTRA test strip, TEST UP TO FOUR TIMES A DAY AS DIRECTED, Disp: 300 strip, Rfl: 0   QUEtiapine  (SEROQUEL ) 25 MG tablet, Take 1 tablet (25 mg total) by mouth at bedtime., Disp: 90 tablet, Rfl: 1   Semaglutide , 1 MG/DOSE, 4 MG/3ML SOPN, Inject 1 mg as directed once a week., Disp: 9 mL, Rfl:  3   venlafaxine  XR (EFFEXOR -XR) 150 MG 24 hr capsule, Take 1 capsule (150 mg total) by mouth every morning., Disp: 90 capsule, Rfl: 1   losartan  (COZAAR ) 100 MG tablet, TAKE 1 TABLET BY MOUTH EVERY DAY (Patient not taking: Reported on 12/18/2023), Disp: 90 tablet, Rfl: 0  Allergies  Allergen Reactions   Abilify [Aripiprazole] Shortness Of Breath    Other reaction(s): Difficulty breathing   Invokana [Canagliflozin] Itching    Yeast infections   Metformin  And Related     Diarrhea, but doing well with XR formulation     Lovaza  [Omega-3-Acid  Ethyl Esters (Fish)] Other (See Comments)    Burping     I personally reviewed active problem list, medication list, allergies, family history with the patient/caregiver today.   ROS  Ten systems reviewed and is negative except as mentioned in HPI  Objective Physical Exam  CONSTITUTIONAL: Patient appears well-developed and well-nourished. No distress. HEENT: Head atraumatic, normocephalic, neck supple. CARDIOVASCULAR: Normal rate, regular rhythm and normal heart sounds. No murmur heard. No BLE edema. PULMONARY: Effort normal and breath sounds normal. Lungs clear to auscultation. No respiratory distress. ABDOMINAL: There is no tenderness or distention. MUSCULOSKELETAL: Normal gait. Without gross motor or sensory deficit. PSYCHIATRIC: Patient has a normal mood and affect. Behavior is normal. Judgment and thought content normal.  Vitals:   12/18/23 1111  BP: (!) 144/82  Pulse: 88  Resp: 16  SpO2: 96%  Weight: 233 lb 4.8 oz (105.8 kg)  Height: 5' 8 (1.727 m)    Body mass index is 35.47 kg/m.  Recent Results (from the past 2160 hours)  HM DIABETES EYE EXAM     Status: None   Collection Time: 10/08/23 11:11 AM  Result Value Ref Range   HM Diabetic Eye Exam No Retinopathy No Retinopathy    Comment: abst by him  Glucose, capillary     Status: Abnormal   Collection Time: 11/26/23  9:43 AM  Result Value Ref Range   Glucose-Capillary  106 (H) 70 - 99 mg/dL    Comment: Glucose reference range applies only to samples taken after fasting for at least 8 hours.  POCT glycosylated hemoglobin (Hb A1C)     Status: None   Collection Time: 12/18/23 11:16 AM  Result Value Ref Range   Hemoglobin A1C 5.6 4.0 - 5.6 %   HbA1c POC (<> result, manual entry)     HbA1c, POC (prediabetic range)     HbA1c, POC (controlled diabetic range)      Diabetic Foot Exam:     PHQ2/9:    12/18/2023   11:10 AM 07/10/2023    2:30 PM 06/17/2023   11:52 AM 12/09/2022   11:33 AM 08/07/2022   11:00 AM  Depression screen PHQ 2/9  Decreased Interest 1 0 1 0 1  Down, Depressed, Hopeless 1 1 1  0 1  PHQ - 2 Score 2 1 2  0 2  Altered sleeping 1 0 1 0 0  Tired, decreased energy 1 1 1  0 0  Change in appetite 0 0 0 0 0  Feeling bad or failure about yourself  0 0 0 0 1  Trouble concentrating 1 0 1 0 0  Moving slowly or fidgety/restless 0 0 0 0 0  Suicidal thoughts 0 0 0 0 0  PHQ-9 Score 5 2  5   0  3   Difficult doing work/chores Somewhat difficult Not difficult at all Somewhat difficult  Somewhat difficult     Data saved with a previous flowsheet row definition    phq 9 is positive  Fall Risk:    12/18/2023   11:03 AM 07/10/2023    2:28 PM 06/17/2023   11:47 AM 12/09/2022   11:33 AM 08/07/2022   11:00 AM  Fall Risk   Falls in the past year? 0 1 0 1 0  Comment  tripped on a rug     Number falls in past yr: 0 0 0 0 0  Injury with Fall? 0 0 0 1 0  Risk for fall due to : No Fall Risks Medication side effect No Fall Risks No Fall Risks No Fall Risks  Follow up Falls evaluation completed Falls prevention discussed;Falls evaluation completed Falls prevention discussed;Education provided;Falls evaluation completed Falls prevention discussed Falls prevention discussed;Education provided;Falls evaluation completed      Assessment & Plan Hypertension associated with DM  type 2  Blood pressure elevated at 144/82 mmHg due to non-adherence to losartan . -  Prescribed losartan  50 mg daily. - Scheduled follow-up in 4 months to monitor blood pressure.  Type 2 diabetes mellitus with dyslipidemia  Diabetes well-controlled with A1c of 5.6%. Discussed importance of semaglutide  for weight and glycemic control. Addressed concerns about cost and potential consequences of discontinuation. - Continue semaglutide  and metformin . - Encouraged discussing financial options with family to continue semaglutide .  Morbid obesity (BMI >35) with co-morbidities such as DM BMI over 35, weight stable at 233 lbs. Discussed weight loss importance and lifestyle modifications. - Encouraged weight loss of 3-4 lbs to reduce BMI below 35.  Mixed dyslipidemia Cholesterol improved with atorvastatin  and fenofibrate . LDL close to target, HDL improved. - Continue atorvastatin  40 mg and fenofibrate  145 mg.  Major depression, recurrent Recurrent depression with stress from caregiving. Discussed therapy benefits despite past limited success. - Continue venlafaxine , quetiapine , and Wellbutrin . - Discussed potential benefits of therapy.

## 2023-12-21 ENCOUNTER — Other Ambulatory Visit (HOSPITAL_COMMUNITY): Payer: Self-pay

## 2023-12-21 ENCOUNTER — Telehealth: Payer: Self-pay | Admitting: Pharmacy Technician

## 2023-12-21 NOTE — Telephone Encounter (Signed)
 Pharmacy Patient Advocate Encounter   Received notification from CoverMyMeds that prior authorization for Ozempic  (1 MG/DOSE) 4MG /3ML pen-injectors is required/requested.   Insurance verification completed.   The patient is insured through [facility] ADVANTAGE/RX ADVANCE.   Per test claim: PA required and submitted KEY/EOC/Request #: [id number] from 12/21/23 to 12/20/24. Ran test claim, Copay is $47.00 for 1 month or $120.00 for 3 months. This test claim was processed through [facility]- copay amounts may vary at other pharmacies due to pharmacy/plan contracts, or as the patient moves through the different stages of their insurance plan.

## 2024-01-05 ENCOUNTER — Other Ambulatory Visit: Payer: Self-pay | Admitting: Family Medicine

## 2024-01-05 DIAGNOSIS — E559 Vitamin D deficiency, unspecified: Secondary | ICD-10-CM

## 2024-01-08 NOTE — Telephone Encounter (Signed)
 Requested Prescriptions  Pending Prescriptions Disp Refills   Cholecalciferol (VITAMIN D3) 50 MCG (2000 UT) capsule [Pharmacy Med Name: VITAMIN D3 50 MCG SOFTGEL] 100 capsule 1    Sig: TAKE 1 CAPSULE (2,000 UNITS TOTAL) BY MOUTH DAILY AT 12 NOON.     Endocrinology:  Vitamins - Vitamin D  Supplementation 2 Failed - 01/08/2024 10:42 AM      Failed - Manual Review: Route requests for 50,000 IU strength to the provider      Failed - Vitamin D  in normal range and within 360 days    Vit D, 25-Hydroxy  Date Value Ref Range Status  08/04/2022 43.2 30.0 - 100.0 ng/mL Final    Comment:    Vitamin D  deficiency has been defined by the Institute of Medicine and an Endocrine Society practice guideline as a level of serum 25-OH vitamin D  less than 20 ng/mL (1,2). The Endocrine Society went on to further define vitamin D  insufficiency as a level between 21 and 29 ng/mL (2). 1. IOM (Institute of Medicine). 2010. Dietary reference    intakes for calcium  and D. [place]: The    [employer]. 2. Holick MF, Binkley [place], Bischoff-Ferrari HA, et al.    Evaluation, treatment, and prevention of vitamin D     deficiency: an Endocrine Society clinical practice    guideline. JCEM. 2011 Jul; 96(7):1911-30.          Passed - Ca in normal range and within 360 days    Calcium   Date Value Ref Range Status  06/17/2023 9.8 8.6 - 10.4 mg/dL Final         Passed - Valid encounter within last 12 months    Recent Outpatient Visits           3 weeks ago Dyslipidemia associated with type 2 diabetes mellitus [facility])   [facility] Glenard Mire, MD   6 months ago Dyslipidemia associated with type 2 diabetes mellitus [facility])   [facility] Sowles, Krichna, MD

## 2024-02-18 NOTE — Progress Notes (Signed)
 Tina Short                                          MRN: [id number]   02/18/2024   The [facility] Quality Team Specialist reviewed this patient medical record for the purposes of chart review for care gap closure. The following were reviewed: chart review for care gap closure-controlling blood pressure.    [facility] Quality Team

## 2024-04-15 ENCOUNTER — Ambulatory Visit: Admitting: Family Medicine

## 2024-08-04 ENCOUNTER — Ambulatory Visit
